# Patient Record
Sex: Female | Born: 1950 | Race: White | Hispanic: No | Smoking: Never smoker
Health system: Southern US, Community
[De-identification: ages and names within clinical notes are randomized; demographics above are authoritative.]

## PROBLEM LIST (undated history)

## (undated) ENCOUNTER — Emergency Department (HOSPITAL_BASED_OUTPATIENT_CLINIC_OR_DEPARTMENT_OTHER): Payer: Medicare Other

## (undated) DIAGNOSIS — R0602 Shortness of breath: Secondary | ICD-10-CM

## (undated) DIAGNOSIS — D689 Coagulation defect, unspecified: Secondary | ICD-10-CM

## (undated) DIAGNOSIS — R1310 Dysphagia, unspecified: Secondary | ICD-10-CM

## (undated) DIAGNOSIS — K5900 Constipation, unspecified: Secondary | ICD-10-CM

## (undated) DIAGNOSIS — M1990 Unspecified osteoarthritis, unspecified site: Secondary | ICD-10-CM

## (undated) DIAGNOSIS — E7800 Pure hypercholesterolemia, unspecified: Secondary | ICD-10-CM

## (undated) DIAGNOSIS — E039 Hypothyroidism, unspecified: Secondary | ICD-10-CM

## (undated) DIAGNOSIS — M629 Disorder of muscle, unspecified: Secondary | ICD-10-CM

## (undated) DIAGNOSIS — Z86718 Personal history of other venous thrombosis and embolism: Secondary | ICD-10-CM

## (undated) DIAGNOSIS — E792 Myoadenylate deaminase deficiency: Secondary | ICD-10-CM

## (undated) DIAGNOSIS — Z9884 Bariatric surgery status: Secondary | ICD-10-CM

## (undated) DIAGNOSIS — H18459 Nodular corneal degeneration, unspecified eye: Secondary | ICD-10-CM

## (undated) DIAGNOSIS — N200 Calculus of kidney: Secondary | ICD-10-CM

## (undated) DIAGNOSIS — M549 Dorsalgia, unspecified: Secondary | ICD-10-CM

## (undated) DIAGNOSIS — K829 Disease of gallbladder, unspecified: Secondary | ICD-10-CM

## (undated) DIAGNOSIS — M797 Fibromyalgia: Secondary | ICD-10-CM

## (undated) DIAGNOSIS — M2550 Pain in unspecified joint: Secondary | ICD-10-CM

## (undated) DIAGNOSIS — D649 Anemia, unspecified: Secondary | ICD-10-CM

## (undated) DIAGNOSIS — E559 Vitamin D deficiency, unspecified: Secondary | ICD-10-CM

## (undated) DIAGNOSIS — R600 Localized edema: Secondary | ICD-10-CM

## (undated) DIAGNOSIS — F419 Anxiety disorder, unspecified: Secondary | ICD-10-CM

## (undated) DIAGNOSIS — I10 Essential (primary) hypertension: Secondary | ICD-10-CM

## (undated) DIAGNOSIS — N979 Female infertility, unspecified: Secondary | ICD-10-CM

## (undated) DIAGNOSIS — E538 Deficiency of other specified B group vitamins: Secondary | ICD-10-CM

## (undated) DIAGNOSIS — G9332 Myalgic encephalomyelitis/chronic fatigue syndrome: Secondary | ICD-10-CM

## (undated) DIAGNOSIS — K760 Fatty (change of) liver, not elsewhere classified: Secondary | ICD-10-CM

## (undated) DIAGNOSIS — T7840XA Allergy, unspecified, initial encounter: Secondary | ICD-10-CM

## (undated) DIAGNOSIS — R7303 Prediabetes: Secondary | ICD-10-CM

## (undated) DIAGNOSIS — F32A Depression, unspecified: Secondary | ICD-10-CM

## (undated) HISTORY — DX: Unspecified osteoarthritis, unspecified site: M19.90

## (undated) HISTORY — DX: Allergy, unspecified, initial encounter: T78.40XA

## (undated) HISTORY — PX: CERVICAL FUSION: SHX112

## (undated) HISTORY — DX: Fibromyalgia: M79.7

## (undated) HISTORY — DX: Dorsalgia, unspecified: M54.9

## (undated) HISTORY — PX: GASTRIC BYPASS: SHX52

## (undated) HISTORY — DX: Bariatric surgery status: Z98.84

## (undated) HISTORY — DX: Disease of gallbladder, unspecified: K82.9

## (undated) HISTORY — DX: Coagulation defect, unspecified: D68.9

## (undated) HISTORY — PX: APPENDECTOMY: SHX54

## (undated) HISTORY — DX: Hypothyroidism, unspecified: E03.9

## (undated) HISTORY — DX: Anemia, unspecified: D64.9

## (undated) HISTORY — DX: Anxiety disorder, unspecified: F41.9

## (undated) HISTORY — DX: Myoadenylate deaminase deficiency: E79.2

## (undated) HISTORY — PX: FOOT SURGERY: SHX648

## (undated) HISTORY — DX: Disorder of muscle, unspecified: M62.9

## (undated) HISTORY — DX: Pain in unspecified joint: M25.50

## (undated) HISTORY — DX: Shortness of breath: R06.02

## (undated) HISTORY — DX: Prediabetes: R73.03

## (undated) HISTORY — DX: Essential (primary) hypertension: I10

## (undated) HISTORY — DX: Fatty (change of) liver, not elsewhere classified: K76.0

## (undated) HISTORY — DX: Vitamin D deficiency, unspecified: E55.9

## (undated) HISTORY — DX: Deficiency of other specified B group vitamins: E53.8

## (undated) HISTORY — DX: Myalgic encephalomyelitis/chronic fatigue syndrome: G93.32

## (undated) HISTORY — DX: Nodular corneal degeneration, unspecified eye: H18.459

## (undated) HISTORY — DX: Localized edema: R60.0

## (undated) HISTORY — PX: TOTAL HIP ARTHROPLASTY: SHX124

## (undated) HISTORY — DX: Dysphagia, unspecified: R13.10

## (undated) HISTORY — DX: Female infertility, unspecified: N97.9

## (undated) HISTORY — PX: CHOLECYSTECTOMY: SHX55

## (undated) HISTORY — DX: Depression, unspecified: F32.A

## (undated) HISTORY — DX: Calculus of kidney: N20.0

## (undated) HISTORY — DX: Personal history of other venous thrombosis and embolism: Z86.718

## (undated) HISTORY — DX: Pure hypercholesterolemia, unspecified: E78.00

## (undated) HISTORY — DX: Constipation, unspecified: K59.00

## (undated) HISTORY — PX: ABDOMINAL HYSTERECTOMY: SHX81

## (undated) HISTORY — PX: JOINT REPLACEMENT: SHX530

---

## 2009-04-07 DIAGNOSIS — J302 Other seasonal allergic rhinitis: Secondary | ICD-10-CM | POA: Insufficient documentation

## 2009-04-07 DIAGNOSIS — F418 Other specified anxiety disorders: Secondary | ICD-10-CM

## 2009-04-07 DIAGNOSIS — G729 Myopathy, unspecified: Secondary | ICD-10-CM

## 2009-04-07 DIAGNOSIS — IMO0002 Reserved for concepts with insufficient information to code with codable children: Secondary | ICD-10-CM | POA: Insufficient documentation

## 2009-04-07 DIAGNOSIS — G43909 Migraine, unspecified, not intractable, without status migrainosus: Secondary | ICD-10-CM | POA: Insufficient documentation

## 2009-04-07 DIAGNOSIS — G4700 Insomnia, unspecified: Secondary | ICD-10-CM | POA: Insufficient documentation

## 2009-04-07 DIAGNOSIS — D519 Vitamin B12 deficiency anemia, unspecified: Secondary | ICD-10-CM

## 2009-04-07 DIAGNOSIS — M419 Scoliosis, unspecified: Secondary | ICD-10-CM

## 2009-04-07 HISTORY — DX: Insomnia, unspecified: G47.00

## 2009-04-07 HISTORY — DX: Other specified anxiety disorders: F41.8

## 2009-04-07 HISTORY — DX: Myopathy, unspecified: G72.9

## 2009-04-07 HISTORY — DX: Scoliosis, unspecified: M41.9

## 2009-04-07 HISTORY — DX: Vitamin B12 deficiency anemia, unspecified: D51.9

## 2009-04-07 HISTORY — DX: Migraine, unspecified, not intractable, without status migrainosus: G43.909

## 2009-04-07 HISTORY — DX: Other seasonal allergic rhinitis: J30.2

## 2009-09-28 DIAGNOSIS — M130 Polyarthritis, unspecified: Secondary | ICD-10-CM | POA: Insufficient documentation

## 2009-09-28 HISTORY — DX: Polyarthritis, unspecified: M13.0

## 2010-08-19 DIAGNOSIS — R7301 Impaired fasting glucose: Secondary | ICD-10-CM

## 2010-08-19 DIAGNOSIS — E785 Hyperlipidemia, unspecified: Secondary | ICD-10-CM

## 2010-08-19 HISTORY — DX: Hyperlipidemia, unspecified: E78.5

## 2010-08-19 HISTORY — DX: Impaired fasting glucose: R73.01

## 2010-10-13 DIAGNOSIS — L409 Psoriasis, unspecified: Secondary | ICD-10-CM

## 2010-10-13 HISTORY — DX: Psoriasis, unspecified: L40.9

## 2010-11-15 DIAGNOSIS — M810 Age-related osteoporosis without current pathological fracture: Secondary | ICD-10-CM

## 2010-11-15 HISTORY — DX: Age-related osteoporosis without current pathological fracture: M81.0

## 2012-08-12 DIAGNOSIS — Z9884 Bariatric surgery status: Secondary | ICD-10-CM | POA: Insufficient documentation

## 2012-08-12 DIAGNOSIS — D729 Disorder of white blood cells, unspecified: Secondary | ICD-10-CM | POA: Insufficient documentation

## 2012-08-12 DIAGNOSIS — D75839 Thrombocytosis, unspecified: Secondary | ICD-10-CM | POA: Insufficient documentation

## 2012-08-12 DIAGNOSIS — D72828 Other elevated white blood cell count: Secondary | ICD-10-CM

## 2012-08-12 HISTORY — DX: Other elevated white blood cell count: D72.828

## 2012-08-12 HISTORY — DX: Bariatric surgery status: Z98.84

## 2012-08-12 HISTORY — DX: Thrombocytosis, unspecified: D75.839

## 2013-10-30 DIAGNOSIS — R32 Unspecified urinary incontinence: Secondary | ICD-10-CM

## 2013-10-30 HISTORY — DX: Unspecified urinary incontinence: R32

## 2015-03-03 DIAGNOSIS — M545 Low back pain, unspecified: Secondary | ICD-10-CM

## 2015-03-03 HISTORY — DX: Low back pain, unspecified: M54.50

## 2015-12-02 DIAGNOSIS — J3081 Allergic rhinitis due to animal (cat) (dog) hair and dander: Secondary | ICD-10-CM

## 2015-12-02 HISTORY — DX: Allergic rhinitis due to animal (cat) (dog) hair and dander: J30.81

## 2016-07-14 LAB — HM COLONOSCOPY

## 2016-08-29 LAB — CBC WITH AUTOMATED DIFF
ABS. BASOPHILS: 0 10*3/uL (ref 0.00–0.10)
ABS. EOSINOPHILS: 0.2 10*3/uL (ref 0.00–0.50)
ABS. IMM. GRANS.: 0.02 10*3/uL (ref 0–0.03)
ABS. LYMPHOCYTES: 2.2 10*3/uL (ref 1.20–3.70)
ABS. MONOCYTES: 0.6 10*3/uL (ref 0.20–0.80)
ABS. NEUTROPHILS: 5.2 10*3/uL (ref 1.56–6.13)
BASOPHILS: 0.2 %
EOSINOPHILS: 2.6 %
HCT: 37.7 % (ref 36.0–45.0)
HGB: 12.5 GM/DL (ref 11.8–14.8)
IMMATURE GRANULOCYTES: 0.2 % (ref 0–0.43)
LYMPHOCYTES: 26.4 %
MCH: 29.1 PG (ref 25.6–32.2)
MCHC: 33.2 G/DL (ref 32.2–35.5)
MCV: 87.9 FL (ref 80–95)
MONOCYTES: 7.8 %
MPV: 9.5 FL (ref 9.4–12.4)
NEUTROPHILS: 62.8 %
NRBC: 0 /100 WBC (ref 0–0.02)
PLATELET: 281 10*3/uL (ref 150–400)
RBC: 4.29 M/ul (ref 3.93–5.22)
RDW: 12.6 % (ref 11.6–14.4)
WBC: 8.2 10*3/uL (ref 4.0–10.0)

## 2016-08-29 LAB — METABOLIC PANEL, COMPREHENSIVE
ALT (SGPT): 19 IU/L (ref 13–56)
AST (SGOT): 15 IU/L (ref 15–37)
Albumin: 3.4 G/DL (ref 3.4–5.0)
Alk. phosphatase: 116 U/L (ref 45–117)
Anion gap: 6.7 MMOL/L (ref 5–15)
BUN: 9 MG/DL (ref 7–18)
Bilirubin, total: 0.4 MG/DL (ref 0.2–1.2)
CO2: 27.3 MMOL/L (ref 21–32)
Calcium: 8.8 MG/DL (ref 8.5–10.1)
Chloride: 103 MMOL/L (ref 98–110)
Creatinine: 0.7 mg/dL (ref 0.6–1.0)
Estimated GFR: >60 mL/min/{1.73_m2} (ref >60)
Glucose: 98 MG/DL (ref 74–140)
Potassium: 3.7 MMOL/L (ref 3.5–5.1)
Protein, total: 7.4 G/DL (ref 6.4–8.2)
Sodium: 137 MMOL/L (ref 136–145)

## 2016-08-29 LAB — LIPASE: Lipase: 223.3 U/L (ref 73–393)

## 2016-09-12 ENCOUNTER — Ambulatory Visit (HOSPITAL_BASED_OUTPATIENT_CLINIC_OR_DEPARTMENT_OTHER)

## 2016-09-12 ENCOUNTER — Ambulatory Visit: Admitting: Ophthalmology

## 2016-10-10 DIAGNOSIS — L408 Other psoriasis: Secondary | ICD-10-CM | POA: Insufficient documentation

## 2016-10-10 HISTORY — DX: Other psoriasis: L40.8

## 2016-11-17 DIAGNOSIS — Z0289 Encounter for other administrative examinations: Secondary | ICD-10-CM

## 2016-11-17 HISTORY — DX: Encounter for other administrative examinations: Z02.89

## 2017-02-05 ENCOUNTER — Ambulatory Visit: Admitting: Neurological Surgery

## 2017-02-05 NOTE — Addendum Note (Signed)
REPORT:          SITE READ:5    The patient is status post anterior fusion at C6/C7 with complete solid bony  fusion across the disc space at this level.    Hendricks Limes MD 04/30/2017 3:48 PM

## 2017-02-16 ENCOUNTER — Ambulatory Visit: Admitting: Neurological Surgery

## 2017-02-16 LAB — HX BASIC METABOLIC PANEL
CASE NUMBER: 2018082001725
HX ANION GAP: 6 (ref 3.0–11.0)
HX BUN: 13 mg/dL (ref 7.0–18.0)
HX CALCIUM LVL: 9.3 mg/dL (ref 8.5–10.1)
HX CHLORIDE: 101 mmol/L (ref 98.0–110.0)
HX CO2: 30 mmol/L (ref 21.0–32.0)
HX CREATININE: 0.895 mg/dL (ref 0.55–1.3)
HX GLUCOSE LVL: 94 mg/dL (ref 65.0–110.0)
HX POTASSIUM LVL: 3.9 mmol/L (ref 3.6–5.2)
HX SODIUM LVL: 137 mmol/L (ref 136.0–145.0)

## 2017-02-16 LAB — HX CBC W/ INDICES
CASE NUMBER: 2018082001725
HX HCT: 40.6 % (ref 36.0–47.0)
HX HGB: 13.1 g/dL (ref 11.8–15.8)
HX MCH: 29 pg (ref 27.0–31.0)
HX MCHC: 32.3 g/dL (ref 32.0–36.0)
HX MCV: 91 fL (ref 81.0–99.0)
HX MPV: 9.9 fL (ref 7.4–11.5)
HX NRBC PERCENT: 0 %
HX PLATELET: 311 10*3/uL (ref 150.0–400.0)
HX RBC: 4.47 10*6/uL (ref 3.6–5.0)
HX RDW: 12.9 % (ref 11.5–14.5)
HX WBC: 7.4 10*3/uL (ref 3.7–11.2)

## 2017-02-16 LAB — HX PSC EKG LAB: CASE NUMBER: 2018082001725

## 2017-02-16 LAB — HX GLOMERULAR FILTRATION RATE (ESTIMATED)
CASE NUMBER: 2018082001725
HX AFN AMER GLOMERULAR FILTRATION RATE: 78 mL/min/{1.73_m2}
HX NON-AFN AMER GLOMERULAR FILTRATION RATE: 67 mL/min/{1.73_m2}

## 2017-02-17 LAB — HX URINE DIPSTICK W/REFLEX
CASE NUMBER: 2018082001727
HX UA BILIRUBIN: NEGATIVE
HX UA BLOOD: NEGATIVE
HX UA GLUCOSE: NEGATIVE
HX UA KETONES: NEGATIVE
HX UA NITRITE: NEGATIVE
HX UA PH: 5 (ref 5.0–8.0)
HX UA PROTEIN: NEGATIVE
HX UA RBC: 4 /HPF — ABNORMAL HIGH (ref 0.0–3.0)
HX UA SPECIFIC GRAVITY: 1.019 (ref 1.005–1.03)
HX UA SQUAMOUS EPITHELIAL: <1 (ref 0.0–5.0)
HX UA UROBILINOGEN: NEGATIVE
HX UA WBC: 13 /HPF — ABNORMAL HIGH (ref 0.0–5.0)

## 2017-02-18 LAB — HX URINE CULTURE
CASE NUMBER: 2018082001728
HX F: 30000
HX P: 30000

## 2017-02-18 LAB — HX MRSA/MSSA PRE-OP SCREEN: CASE NUMBER: 2018082001726

## 2017-02-26 ENCOUNTER — Ambulatory Visit: Admitting: Neurological Surgery

## 2017-02-27 ENCOUNTER — Inpatient Hospital Stay
Admit: 2017-02-27 | Disposition: A | Discharge status: Rehabilitation Facility | Source: Ambulatory Visit | Attending: Neurological Surgery | Admitting: Neurological Surgery

## 2017-02-27 NOTE — Op Note (Signed)
__________________________________________________________________________    OPERATIVE REPORT  DATE:  02/27/2017    PREOPERATIVE DIAGNOSIS:  L2 to L5 decompressive surgery for lumbar canal  stenosis causing neurogenic claudication.    POSTOPERATIVE DIAGNOSIS:  L2 to L5 decompressive surgery for lumbar canal  stenosis causing neurogenic claudication.    PROCEDURES:    1.  L2-3, L3-4 and L4-5 decompressive lumbar laminectomy.  2.  L2-3, L3-4 and L4-5 laminoforaminotomy.    SURGEON:  Radene Gunning, M.D.    ASSISTANT:  Tina Griffiths, P.A. -C.    ANESTHESIA:  General.     INDICATIONS:  This 66 year old lady has been having significant back radiating  down to both lower limbs.  The patient's MRI scan demonstrated severe canal  stenosis at L2-3, L3-4 and L4-5.  The patient is here for decompressive surgery.    The patient was explained the procedure in detail.  The patient also explained  the possible complications to include but not limited to bleeding, infection,  nerve injury, CSF leak, unstable spine, bowel and bladder sphincter  disturbances, paraplegia as the patient has already been explained these and  written consent has been obtained in the office.    DESCRIPTION OF PROCEDURE:  The patient was transferred to the OR.  General  anesthesia was induced.  The patient was intubated with ease.  The patient was  flipped on the OR safety table.  Lower back was cleansed with chlorhexidine.   Marker x-ray was obtained for approximate level of surgery.  ChloraPrep was used  to obtain a sterile field with sterile drapes.  Time out was called to identify  the correct patient and the procedure.  Midline skin was incised.  Subcutaneous  tissue was dissected.  Retractors were placed.  Spinous processes were  identified.  Thoracolumbar fascia was incised on either side of the spinous  process to expose the interlaminar space between L2 to L5.  Marker x-ray was  again opened to the level of surgery.  Retractors were then  placed.  Spinous  processes at L3 as well as L4 were completely removed.  The lower half of the L2  was also removed.  This led to the lamina.  High speed drill was then used to  clear the gutters on either side on each of the lamina.  Upon thinning it out,  up cut punches were then used to remove the lamina.  This led to the exposure of  the ligamentum flavum.  This was carefully curetted out to enter the canal.   Cottonoids were placed in the canal to prevent any extra durotomy.  Up cut  punches were then used to remove all the lamina to complete the laminectomy.  It  was noted that the patient has significantly hypertrophied ligamentum flavum.   It was carefully curetted out to decompress between L2 to L5.  At each level,  the pedicles were palpated to make sure the lateral gutter was cleared by using  up cut punch.  The patient has mild foraminal stenosis at all the levels.   Curved Kerrison punches were then used sequentially at L2-3, L4-5 and L4-5 and  were decompressed.  Epidural hemostasis using bipolar cautery and Surgiflo.   Revita human intact membrane was placed in the lateral gutters to prevent any  further scarring.  Exparel was infiltrated in the paraspinal muscles.  A dry  Gelfoam was placed over the dura.  A medium Hemovac drain was placed in the  intermuscular plane.  Thoracolumbar fascia was then closed  using 0 Vicryl.   Another medium Hemovac was placed in the subcutaneous layer and brought through  a separate stab incision.  Subcutaneous tissue was then closed using 0 Vicryl as  well as 3-0 Vicryl.  Skin was closed using a ZipLine.  Drain was secured to the  skin using 3-0 nylon.  All the sharps and softs were correct x2.  Sterile  dressing was applied.  The patient was flipped back onto the bed.  Sedation was  weaned off and extubated with ease and transferred to PACU.    ESTIMATED BLOOD LOSS:  250 mL.    COMPLICATIONS:  None.    Dictated by:  Radene Gunning, M.D.    D:  02/28/2017 15:25:16  T:   03/01/2017 07:11:16  E:  03/01/2017 07:11:16  KK/par  Job# [id number]   SIGNATURE LINE    Electronically signed by Janice Norrie MD, Verna Czech on 03/06/2017 at 09:49:48 EST

## 2017-02-28 LAB — HX CBC W/ INDICES
CASE NUMBER: 2018094000666
HX HCT: 30.7 % — ABNORMAL LOW (ref 36.0–47.0)
HX HGB: 10.3 g/dL — ABNORMAL LOW (ref 11.8–15.8)
HX MCH: 29 pg (ref 27.0–31.0)
HX MCHC: 33.6 g/dL (ref 32.0–36.0)
HX MCV: 87 fL (ref 81.0–99.0)
HX MPV: 9.4 fL (ref 7.4–11.5)
HX NRBC PERCENT: 0 %
HX PLATELET: 240 10*3/uL (ref 150.0–400.0)
HX RBC: 3.52 10*6/uL — ABNORMAL LOW (ref 3.6–5.0)
HX RDW: 12.5 % (ref 11.5–14.5)
HX WBC: 8.5 10*3/uL (ref 3.7–11.2)

## 2017-02-28 LAB — HX MRSA/MSSA PRE-OP SCREEN: CASE NUMBER: 2018092002261

## 2017-02-28 LAB — HX ELECTROLYTE PANEL
CASE NUMBER: 2018094000115
HX ANION GAP: 10 (ref 3.0–11.0)
HX CHLORIDE: 101 mmol/L (ref 98.0–110.0)
HX CO2: 23 mmol/L (ref 21.0–32.0)
HX POTASSIUM LVL: 3.8 mmol/L (ref 3.6–5.2)
HX SODIUM LVL: 134 mmol/L — ABNORMAL LOW (ref 136.0–145.0)

## 2017-03-01 LAB — HX CBC W/ INDICES
CASE NUMBER: 2018095000127
HX HCT: 27.7 % — ABNORMAL LOW (ref 36.0–47.0)
HX HGB: 8.9 g/dL — ABNORMAL LOW (ref 11.8–15.8)
HX MCH: 29 pg (ref 27.0–31.0)
HX MCHC: 32.1 g/dL (ref 32.0–36.0)
HX MCV: 90 fL (ref 81.0–99.0)
HX MPV: 9.5 fL (ref 7.4–11.5)
HX NRBC PERCENT: 0 %
HX PLATELET: 196 10*3/uL (ref 150.0–400.0)
HX RBC: 3.07 10*6/uL — ABNORMAL LOW (ref 3.6–5.0)
HX RDW: 12.8 % (ref 11.5–14.5)
HX WBC: 7.2 10*3/uL (ref 3.7–11.2)

## 2017-03-01 LAB — HX ELECTROLYTE PANEL
CASE NUMBER: 2018095000127
HX ANION GAP: 7 (ref 3.0–11.0)
HX CHLORIDE: 113 mmol/L — ABNORMAL HIGH (ref 98.0–110.0)
HX CO2: 25 mmol/L (ref 21.0–32.0)
HX POTASSIUM LVL: 2.7 mmol/L — AB (ref 3.6–5.2)
HX SODIUM LVL: 145 mmol/L (ref 136.0–145.0)

## 2017-03-02 LAB — HX ELECTROLYTE PANEL
CASE NUMBER: 2018096000706
HX ANION GAP: 8 (ref 3.0–11.0)
HX CHLORIDE: 100 mmol/L (ref 98.0–110.0)
HX CO2: 28 mmol/L (ref 21.0–32.0)
HX POTASSIUM LVL: 3.3 mmol/L — ABNORMAL LOW (ref 3.6–5.2)
HX SODIUM LVL: 136 mmol/L (ref 136.0–145.0)

## 2017-03-02 LAB — HX POTASSIUM LEVEL
CASE NUMBER: 2018095002530
HX POTASSIUM LVL: 3.4 mmol/L — ABNORMAL LOW (ref 3.6–5.2)

## 2017-04-18 DIAGNOSIS — L821 Other seborrheic keratosis: Secondary | ICD-10-CM | POA: Insufficient documentation

## 2017-04-18 HISTORY — DX: Other seborrheic keratosis: L82.1

## 2017-07-19 LAB — LIPID PROFILE (EXT)
Chol/HDL Ratio (EXT): 3 ratio
Cholesterol (EXT): 194 mg/dL
HDL Cholesterol (EXT): 64 mg/dL
LDL Cholesterol, CALC (EXT): 111 mg/dL
Triglycerides (EXT): 95 mg/dL

## 2017-08-20 ENCOUNTER — Ambulatory Visit: Admitting: Neurological Surgery

## 2017-08-30 ENCOUNTER — Emergency Department
Admit: 2017-08-30 | Disposition: A | Discharge status: Home / Self Care | Source: Home / Self Care | Attending: Physician Assistant | Admitting: Physician Assistant

## 2017-08-31 LAB — HX URINE DIPSTICK W/REFLEX
CASE NUMBER: 2018278000206
HX UA BLOOD: NEGATIVE
HX UA GLUCOSE: NEGATIVE
HX UA HYALINE CASTS: 4 /LPF (ref 0.0–4.0)
HX UA KETONES: NEGATIVE
HX UA LEUKOCYTE ESTERASE: 75 WBC/uL — AB
HX UA NITRITE: NEGATIVE
HX UA PH: 5 (ref 5.0–8.0)
HX UA PROTEIN: NEGATIVE
HX UA RBC: 4 /HPF — ABNORMAL HIGH (ref 0.0–2.0)
HX UA SPECIFIC GRAVITY: 1.021 (ref 1.003–1.03)
HX UA SQUAMOUS EPITHELIAL: 1 /HPF (ref 0.0–5.0)
HX UA UROBILINOGEN: 2 — AB
HX UA WBC: 5 /HPF (ref 0.0–5.0)

## 2017-09-01 LAB — HX URINE CULTURE
CASE NUMBER: 2018278000238
HX F: 20000

## 2017-11-01 LAB — LIPID PROFILE (EXT)
Chol/HDL Ratio (EXT): 3 ratio
Cholesterol (EXT): 201 mg/dL
HDL Cholesterol (EXT): 67 mg/dL
LDL Cholesterol, CALC (EXT): 107 mg/dL
Triglycerides (EXT): 137 mg/dL

## 2018-01-01 DIAGNOSIS — Z9889 Other specified postprocedural states: Secondary | ICD-10-CM | POA: Insufficient documentation

## 2018-01-01 DIAGNOSIS — E669 Obesity, unspecified: Secondary | ICD-10-CM | POA: Insufficient documentation

## 2018-01-01 HISTORY — DX: Obesity, unspecified: E66.9

## 2018-01-01 HISTORY — DX: Other specified postprocedural states: Z98.890

## 2018-01-14 DIAGNOSIS — Z96642 Presence of left artificial hip joint: Secondary | ICD-10-CM

## 2018-01-14 HISTORY — DX: Presence of left artificial hip joint: Z96.642

## 2018-01-17 DIAGNOSIS — E871 Hypo-osmolality and hyponatremia: Secondary | ICD-10-CM | POA: Insufficient documentation

## 2018-01-17 HISTORY — DX: Hypo-osmolality and hyponatremia: E87.1

## 2018-04-30 DIAGNOSIS — L304 Erythema intertrigo: Secondary | ICD-10-CM

## 2018-04-30 DIAGNOSIS — L814 Other melanin hyperpigmentation: Secondary | ICD-10-CM

## 2018-04-30 HISTORY — DX: Erythema intertrigo: L30.4

## 2018-04-30 HISTORY — DX: Other melanin hyperpigmentation: L81.4

## 2018-06-04 LAB — UNMAPPED LAB RESULTS: Albumin (EXT): 3.1 g/dL — ABNORMAL LOW (ref 3.2–5.2)

## 2018-06-10 ENCOUNTER — Ambulatory Visit: Admit: 2018-06-10 | Discharge: 2018-06-10 | Payer: MEDICARE | Attending: Geriatric Medicine | Primary: Internal Medicine

## 2018-06-10 ENCOUNTER — Ambulatory Visit: Attending: Geriatric Medicine | Primary: Internal Medicine

## 2018-06-10 DIAGNOSIS — Z96641 Presence of right artificial hip joint: Secondary | ICD-10-CM

## 2018-06-10 NOTE — Progress Notes (Signed)
[facility]    [address] [place], [place]  [phone]    SUBACUTE REHABILITATION facility/[place]  History and Physical    Name: Aaron MoseCathy Fawver CSN: [id number]   Age: 67 y.o. MRN: [id number]   Sex: female Admit Date: (Not on file)     HPI:     Aaron MoseCathy Krahenbuhl is a 67 y.o. female with multiple medical problems including hypertension, chronic back pain, fibromyalgia, as well as hyperlipidemia, history of gastric bypass surgery due to obesity, who admitted to rehabilitation status post s/p right THA   [facility] from July 11th to July 14th.  Patient reports the pain was severe and  increased with activity , rated  pain  7 /10.   History of chronic pain and patient was  taking Dilaudid 1-2 tabs every 3 hr as needed with good relief.  Patient ambulated with rolling walker with assistance.  Denies any chest pain or palpitation.  Tolerated p.o. well and patient's last bowel movement is fall time 4 days ago.  Denies any UTI symptoms.  She had Positive generalized weakness, poor balance and coordination  and gait instability.  Patient also reports some pain in the lower back area, as she  expressed interest in TENS as one of  treatment options for her back pain.   No fever or chill.  Denies any headache or dizziness.  Denies any chest pain or palpitation.  No nausea /vomiting.  No UTI symptoms.  Patient tolerated p.o. well.          Denies any significant mood swing.  Patient's memory was stable        Past medical history including arthritis, but B12 deficiency, depression and anxiety, history of fibromyalgia, history of hypertension, insomnia, iron deficiency anemia, migraine, scoliosis and kyphosis back pain, multiple as back surgery, history of osteopenia, and history of Port-A-Cath in place with history of thrombocytosis, history [facility], history of a chronic low back pain, history of fevers psoriasis, and obesity with BMI between 30 and 39 and  history of a cervical spinal surgery 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    Social History     Socioeconomic History   ??? Marital status: MARRIED     Spouse name: Not on file   ??? Number of children: Not on file   ??? Years of education: Not on file   ??? Highest education level: Not on file     Functional History,       Medications on transfer  Prior to Admission medications    Not on File     Aspirin aspirin 81 mg twice daily status post right THA a DVT prophylaxis for 30 days  Vitamin-D 5000 unit once weekly  Iron sulfate 325 mg once daily  Gabapentin 300 mg t.i.d.  Hydromorphone 2 mg 1 tab every 3 hr as needed for pain hydromorphone 2 mg 2 tabs every 3 hr as needed for pain between 8 and 10  Levothyroxine 50 mg mcg once daily  Lisinopril/hydrochlorothiazide 10/12.5 mg 1 tab once daily naproxen tablet 500 mg give 1 tab by mouth twice a day for pain omeprazole extended release 20 mg once daily MiraLax 17 g in 8 oz of water p.o. once daily hold for loose stool  Senna Plus 8.6 mg/50 mg 1  tab once a day hold for loose stool  Temazepam 30 mg 1 capsule q.h.s. for insomnia  Tylenol Extra Strength 500 mg 2 tabs by mouth every 8 hr as needed for pain   Desvenlafaxine  ER 24 hr 100 mg once daily for depression      Allergy; prednisone, Compazine, , Lyrica phenothiazine        Review of Systems    A comprehensive review of systems was negative except for that written in the History of Present Illness.      Physical Exam:          Physical Exam:  VS 98/50-98-82-20, 96% wt 214#       GEN: Alert and oriented times three. NAD.  Patient able to follow commands.  Answers questions appropriately. Speech clear.   EYES:  Pupils equal and reactive to light, extraocular movements intact, conjunctiva normal, lids with out lesions.    HEENT: MMM, No thyromegaly, no lymphadenopathy. Oral mucosa without coating on tongue.  HEART: RRR +S1 +S2, no JVD, pulses 2+ distally.   LUNGS: CTA B/L no wheezes, rales or rhonchi.   ABDOMEN: + BS, soft NT/ND no organomegaly.   GU:  No suprapubic tenderness,  Back mild tenderness to palpation, scar tissue noticed in the back  EXTREMITIES:   Right hip incision dressing clean and dry, tenderness to palpation, no bleeding.  Positive lower extremity edema and positive weakness and gait instability  SKIN: no rashes or skin breakdown.   PSYCH:  Anxious affect and memory was appropriate.   NEURO:   Positive weakness and gait instability, ambulated with rolling walker      Labs Reviewed:    All lab results for the last 24 hours reviewed.    Assessment/Plan     67 years old woman with multiple medical problems including chronic back pain, severe osteoarthritis, status post right total hip replacement , with deconditioning and weakness/gait instability who was admitted to subacute rehab for IDT team rehab/ evaluation and management including PT OT and ST, as well as nursing assistance support and active management of current medical condition, specifically     Status post right  total hip replacement, pain management, DVT prophylaxis with aspirin b.i.d. add a PT OT rehabilitation to  improve strengths, induration and flexibility, follow-up with orthopedic surgeon [facility]     Gait instability, fall precaution home safety issue discussion, using rolling walker and assistance, patient's pain was complicated with chronic lower back pain     Chronic back pain, history of  back area surgery, PT OT, pain management, prescription for TENS  unit    Constipation ,  increase p.o. hydration, increase dietary fiber fiber, increase activity, will increase senna S to 2 tab b.i.d., Dulcolax suppository 10 mg p.r. x1 tonight    Hypertension, continue lisinopril/hydrochlorothiazide, check BP and heart rate, BMP     Hyperlipidemia low-fat cholesterol diet diet,    Depression , behavior support and counseling, continue medication    Fibromyalgia, pain management, multiple disc brain intervention discussion and management D advised, non pharmaceutical intervention of the recommend    Code status CPR, DPOA available    Consultation discussion, all questions and concerns addressed, patient expressed understanding and agreed with recommendation treatment  Total current time over 70 min    Ortencia Kick, MD  June 10, 2018  11:43 AM

## 2018-06-10 NOTE — Progress Notes (Signed)
[facility]    [address], [place], [place]  [address]    SUBACUTE REHABILITATION facility/[place]  History and Physical    Name: April Braun CSN: [id number]   Age: 67 y.o. MRN: [id number]   Sex: female Admit Date: (Not on file)     HPI:     April Braun is a 67 y.o. female with multiple medical problems including hypertension, chronic back pain, fibromyalgia, as well as hyperlipidemia, history of gastric bypass surgery due to obesity, who admitted to rehabilitation status post s/p right THA   at [facility]/ from July 11th to July 14th.  Patient reports the pain was severe and  increased with activity , rated  pain  7 /10.   History of chronic pain and patient was  taking Dilaudid 1-2 tabs every 3 hr as needed with good relief.  Patient ambulated with rolling walker with assistance.  Denies any chest pain or palpitation.  Tolerated p.o. well and patient's last bowel movement is fall time 4 days ago.  Denies any UTI symptoms.  She had Positive generalized weakness, poor balance and coordination  and gait instability.  Patient also reports some pain in the lower back area, as she  expressed interest in TENS as one of  treatment options for her back pain.   No fever or chill.  Denies any headache or dizziness.  Denies any chest pain or palpitation.  No nausea /vomiting.  No UTI symptoms.  Patient tolerated p.o. well.          Denies any significant mood swing.  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    Social History     Socioeconomic History   ??? Marital status: MARRIED     Spouse name: Not on file   ??? Number of children: Not on file   ??? Years of education: Not on file   ??? Highest education level: Not on file     Functional History,       Medications on transfer  Prior to Admission medications    Not on File     Aspirin aspirin 81 mg twice daily status post right THA a DVT prophylaxis for 30 days  Vitamin-D 5000 unit once weekly  Iron sulfate 325 mg once daily  Gabapentin 300 mg t.i.d.  Hydromorphone 2 mg 1 tab every 3 hr as needed for pain hydromorphone 2 mg 2 tabs every 3 hr as needed for pain between 8 and 10  Levothyroxine 50 mg mcg once daily  Lisinopril/hydrochlorothiazide 10/12.5 mg 1 tab once daily naproxen tablet 500 mg give 1 tab by mouth twice a day for pain omeprazole extended release 20 mg once daily MiraLax 17 g in 8 oz of water p.o. once daily hold for loose stool  Senna Plus 8.6 mg/50 mg 1  tab once a day hold for loose stool  Temazepam 30 mg 1 capsule q.h.s. for insomnia  Tylenol Extra Strength 500 mg 2 tabs by mouth every 8 hr as needed for pain   Desvenlafaxine  ER 24 hr 100 mg once daily for depression      Allergy; prednisone, Compazine, , Lyrica phenothiazine        Review of Systems    A comprehensive review of systems was negative except for that written in the History of Present Illness.      Physical Exam:          Physical Exam:  VS 98/50-98-82-20, 96% wt 214#       GEN: Alert and oriented times three. NAD.  Patient able to follow commands.  Answers questions appropriately. Speech clear.   EYES:  Pupils equal and reactive to light, extraocular movements intact, conjunctiva normal, lids with out lesions.   HEENT: MMM, No thyromegaly, no lymphadenopathy. Oral mucosa without coating on  tongue.  HEART: RRR +S1 +S2, no JVD, pulses 2+ distally.   LUNGS: CTA B/L no wheezes, rales or rhonchi.   ABDOMEN: + BS, soft NT/ND no organomegaly.   GU:  No suprapubic tenderness,  Back mild tenderness to palpation, scar tissue noticed in the back  EXTREMITIES:   Right hip incision dressing clean and dry, tenderness to palpation, no bleeding.  Positive lower extremity edema and positive weakness and gait instability  SKIN: no rashes or skin breakdown.   PSYCH:  Anxious affect and memory was appropriate.   NEURO:   Positive weakness and gait instability, ambulated with rolling walker      Labs Reviewed:    All lab results for the last 24 hours reviewed.    Assessment/Plan     67 years old woman with multiple medical problems including chronic back pain, severe osteoarthritis, status post right total hip replacement , with deconditioning and weakness/gait instability who was admitted to subacute rehab for IDT team rehab/ evaluation and management including PT OT and ST, as well as nursing assistance support and active management of current medical condition, specifically     Status post right  total hip replacement, pain management, DVT prophylaxis with aspirin b.i.d. add a PT OT rehabilitation to  improve strengths, induration and flexibility, follow-up with [facility]opedic [facility]     Gait instability, fall precaution home safety issue discussion, using rolling walker and assistance, patient's pain was complicated with chronic lower back pain     Chronic back pain, history of  back area surgery, PT OT, pain management, prescription for TENS  unit    Constipation ,  increase p.o. hydration, increase dietary fiber fiber, increase activity, will increase senna S to 2 tab b.i.d., Dulcolax suppository 10 mg p.r. x1 tonight    Hypertension, continue lisinopril/hydrochlorothiazide, check BP and heart rate, BMP    Hyperlipidemia low-fat cholesterol diet diet,    Depression , behavior support and counseling, continue  medication    Fibromyalgia, pain management, multiple disc brain intervention discussion and management D advised, non pharmaceutical intervention of the recommend    Code status CPR, DPOA available    Consultation discussion, all questions and concerns addressed, patient expressed understanding and agreed with recommendation treatment  Total current time over 70 min    Ortencia Kickhun Rui R Brooklin Rieger, MD  June 10, 2018  11:43 AM

## 2018-06-12 ENCOUNTER — Ambulatory Visit: Admit: 2018-06-12 | Discharge: 2018-06-12 | Payer: MEDICARE | Attending: Geriatric Medicine | Primary: Internal Medicine

## 2018-06-12 ENCOUNTER — Ambulatory Visit: Attending: Geriatric Medicine | Primary: Internal Medicine

## 2018-06-12 DIAGNOSIS — Z96641 Presence of right artificial hip joint: Secondary | ICD-10-CM

## 2018-06-12 NOTE — Progress Notes (Signed)
[address] [place], [place]  [phone]  SNF/[place]  Progress Note    Name: April MoseCathy Mulvihill CSN: [id number]   DOB:02/26/1951    Age: 67 y.o. MRN: [id number]   Sex: female SSN: [id number]         Primary Rehab Diagnosis: Aftercare S/P Right THA    Medical Issues  Status post right THA  Pain management  Gait instability  Anxiety/questionable depression  Hypertension  History of CAD        Subjective:     [facility] as follow-up of status post right THA , gait instability and complained about severe severe pain with ambulation.  Patient also reported she had a very complicated pain management history in the past after she had the left hip surgery.  Patient was somewhat anxious.  Help positive gait instability.  Reports no falling.  Denies any fever or chills.  Tolerated p.o..  Patient had a bowel movement.  Denies any SI or HI.  No hallucination or delusion.  Her memory was stable    Objective:       PHYSICAL EXAM      Vital stable and blood pressure within normal limit     GEN: Alert and oriented times three.   Anxious  EYES:  Extraocular movements intact, conjunctiva normal, lids with out lesions.   HEAD: Normocephalic, Atraumatic  ENT:  Mucosa moist with no lesions noted. No facial droop noted.   NECK:   Supple, no JVD noted.   HEART:   Regular rate and rhythm  LUNGS:   Decreased breath sound no wheezing  ABDOMEN: + BS, soft NT/ND no organomegaly.   EXTREMITIES:   Right lower extremity edema and tenderness to palpation and decreased range of motion  SKIN: no rashes or skin breakdown.   PSYCH:  Anxious affect /good recall  NEURO:  Positive weakness and ambulated with rolling walker  FUNCTION:   Partial dependent ADL and transfer    Labs reviewed    Assessment and plan      Status post right TKA,  pain management which is critical  for this patient,   Continue Dilaudid 2 mg 1-2 tablets every 3 hr as needed for pain, patient is taking naproxen with understanding of the side effect to  the stomach as well as blood pressure/renal impairment.  Continue gabapentin to help patient neuropathic pain.       emphasis  on nonpharm intervention such as ice packing to the area for comfort.  Continue PT OT and support    Gait instability, fall precaution home safety issue discussion, PT OT, rolling walker with assistance,    History of CAD, stable, continue aspirin and management of blood pressure and low-cholesterol diet    History of anxiety/depression, behavior support and counseling, continue temazepam, gabapentin impaired benefit patient's anxiety symptom and patient may need SSRI    Counseling, consultation discussion, chart review, update and discussed with patient and staff, patient expressed understanding and agreed with recommendation treatment    Signed By: Ortencia Kickhun Rui R Francena Zender, MD     June 12, 2018

## 2018-06-12 NOTE — Progress Notes (Signed)
Progress Notes by Ortencia KickZhao, Chun Rui R, MD at 06/12/18 0920                Author: Ortencia KickZhao, Chun Rui R, MD  Service: --  Author Type: Physician       Filed: 06/26/18 1247  Encounter Date: 06/12/2018  Status: Signed          Editor: Ortencia KickZhao, Chun Rui R, MD (Physician)                          [address] [place], [place]  [phone]   SNF/[place]   Progress Note         Name: April MoseCathy Braun  CSN: [id number]        DOB:05/27/1951     Age: 67 y.o.  MRN: [id number]        Sex: female  SSN: [id number]              Primary Rehab Diagnosis: Aftercare S/P Right THA      Medical Issues   Status post right THA   Pain management   Gait instability   Anxiety/questionable depression   Hypertension   History of CAD              Subjective:        [facility] as follow-up of status post right THA , gait instability and complained about severe severe pain with ambulation.  Patient also reported she had a very complicated pain management history in the past  after she had the left hip surgery.  Patient was somewhat anxious.  Help positive gait instability.  Reports no falling.  Denies any fever or chills.  Tolerated p.o..  Patient had a bowel movement.  Denies any SI or HI.  No hallucination or delusion.   Her memory was stable        Objective:           PHYSICAL EXAM         Vital stable and blood pressure within normal limit       GEN: Alert and oriented times three.    Anxious   EYES:  Extraocular movements intact, conjunctiva normal,  lids with out lesions.    HEAD: Normocephalic, Atraumatic   ENT:  Mucosa moist with no lesions noted. No facial droop noted.    NECK:   Supple, no JVD noted.    HEART:   Regular rate and rhythm   LUNGS:   Decreased breath sound no wheezing   ABDOMEN: + BS, soft NT/ND no organomegaly.     EXTREMITIES:   Right lower extremity edema and tenderness to palpation and decreased range of motion   SKIN: no rashes or skin breakdown.    PSYCH:  Anxious affect /good recall   NEURO:  Positive  weakness and ambulated with rolling walker   FUNCTION:   Partial dependent ADL and transfer      Labs reviewed      Assessment and plan         Status post right TKA,  pain management which is critical  for this patient,   Continue Dilaudid 2 mg 1-2 tablets every 3 hr as needed for pain, patient is taking naproxen with understanding of the side effect to the stomach as well as blood pressure/renal  impairment.  Continue gabapentin to help patient neuropathic pain.       emphasis  on nonpharm intervention such as ice packing  to the area for comfort.  Continue PT OT and support      Gait instability, fall precaution home safety issue discussion, PT OT, rolling walker with assistance,      History of CAD, stable, continue aspirin and management of blood pressure and low-cholesterol diet      History of anxiety/depression, behavior support and counseling, continue temazepam, gabapentin impaired benefit patient's anxiety symptom and patient may need SSRI      Counseling, consultation discussion, chart review, update and discussed with patient and staff, patient expressed understanding and agreed with recommendation treatment         Signed By:  Ortencia Kick, MD           June 12, 2018

## 2018-06-14 ENCOUNTER — Ambulatory Visit: Admit: 2018-06-14 | Discharge: 2018-06-14 | Payer: MEDICARE | Attending: Geriatric Medicine | Primary: Internal Medicine

## 2018-06-14 ENCOUNTER — Ambulatory Visit: Attending: Geriatric Medicine | Primary: Internal Medicine

## 2018-06-14 DIAGNOSIS — Z96641 Presence of right artificial hip joint: Secondary | ICD-10-CM

## 2018-06-14 NOTE — Progress Notes (Signed)
[address] [place], [place]  [phone]    SUBACUTE REHABILITATION / [place]  Progress Note    Name: April MoseCathy Braun CSN: [id number]   DOB:03/12/1951    Age: 67 y.o. MRN: [id number]   Sex: female SSN: [id number]         Primary Rehab Diagnosis: Functional deficit secondary to THA/right   and Generalized Weakness and gait instability    Medical Issues: Active Problems:    * No active hospital problems. *      Other Diagnosis: No past medical history on file.      Subjective:     [facility].   Multiple medical problems status post right THA a secondary to advanced osteoarthritis.  Positive generalized weakness deconditioning and gait instability.  Working  with PT OT rehab and use rolling walker..  Patient reported pain was complicated . patient current taking hydromorphone 2-4 mg regularly around the clock for pain management.  Patient still complaining about the pain which was severe.   She denies any fever or chills.  No headache or dizziness.  No chest pain or palpitation.  No shortness of breath.  No nausea or vomiting.  Bowel movement seems normal.  Denies UTI symptoms.  Slightly anxious.  Good memory..    Objective:     Vitals stable/Afebrile, Systolic blood pressure pressure with normal limit   GEN: Alert and oriented times three.   Anxious  EYES:  Extraocular movements intact, conjunctiva normal, lids with out lesions.   HEAD: Normocephalic, Atraumatic  ENT:  Mucosa moist with no lesions noted. No facial droop noted.   NECK:   Supple, no JVD noted.   HEART:   Regular rate and rhythm   LUNGS , decreased breath sound clear bilaterally, no wheezing    ABDOMEN: + BS, soft NT/ND no organomegaly.   EXTREMITIES:   Positive right lower extremity edema decreased range of motion,  SKIN: no rashes   PSYCH:   Anxious  NEURO:  Ambulated with rolling walker With assistance   FUNCTION:   partial dependent ADL        LABS / ANCILLARY PROCEDURES: Labs reviewed     No results found for this or any previous visit (from the past 24 hour(s)).        Assessment / Plan     Status post THA / Right, pain management including Dilaudid, non pharmaceutical intervention such as ice or heat to the area for comfort, intensive PT OT rehab, follow-up with orthopedic surgeon    Pain management issues, complicated, continue relatively high dose of Dilaudid, discussed with patient to  increase to 2-1/2 pills as needed for pain. Discussed with patient regarding the long-acting pain medication such as MS Contin or OxyContin,, however, patient stated she had very severe  adverse effect in the past.                Gait instability, PT OT rehab, use rolling walker with assistance, fall precaution home safety issue discussion    Anxiety, behavioral  support and counseling , Continue Desvenlafaxine    Counseling, consultation discussion, chart review, care planning and coordination, discussed with and updated patient and staff      Signed By: Ortencia Kickhun Rui R Micha Erck, MD     June 14, 2018

## 2018-06-14 NOTE — Progress Notes (Signed)
Progress Notes by Ortencia KickZhao, Chun Rui R, MD at 06/14/18 1200                Author: Ortencia KickZhao, Chun Rui R, MD  Service: --  Author Type: Physician       Filed: 06/27/18 1951  Encounter Date: 06/14/2018  Status: Signed          Editor: Ortencia KickZhao, Chun Rui R, MD (Physician)                          [address] [place], [place]  [phone]      SUBACUTE REHABILITATION / [place]   Progress Note         Name: April MoseCathy Braun  CSN: [id number]        DOB:01/16/1951     Age: 67 y.o.  MRN: [id number]        Sex: female  SSN: [id number]              Primary Rehab Diagnosis: Functional deficit secondary to THA/right    and Generalized Weakness and gait instability      Medical Issues: Active Problems:     * No active hospital problems. *         Other Diagnosis: No past medical history on file.           Subjective:        [facility].   Multiple medical problems status post right THA a secondary to advanced osteoarthritis.  Positive generalized weakness deconditioning and gait instability.  Working  with PT  OT rehab and use rolling walker..  Patient reported pain was complicated . patient current taking hydromorphone 2-4 mg regularly around the clock for pain management.  Patient still complaining about the pain which was severe.   She denies any fever or  chills.  No headache or dizziness.  No chest pain or palpitation.  No shortness of breath.  No nausea or vomiting.  Bowel movement seems normal.  Denies UTI symptoms.  Slightly anxious.  Good memory..        Objective:        Vitals stable/Afebrile, Systolic blood pressure pressure with normal limit    GEN: Alert and oriented times three.   Anxious   EYES:  Extraocular movements intact, conjunctiva normal,  lids with out lesions.    HEAD: Normocephalic, Atraumatic   ENT:  Mucosa moist with no lesions noted. No facial droop noted.    NECK:   Supple, no JVD noted.    HEART:   Regular rate and rhythm    LUNGS , decreased breath sound clear  bilaterally, no wheezing      ABDOMEN: + BS, soft NT/ND no organomegaly.     EXTREMITIES:   Positive right lower extremity edema decreased range of motion,   SKIN: no rashes    PSYCH:   Anxious   NEURO:  Ambulated with rolling walker With assistance    FUNCTION:   partial dependent ADL            LABS / ANCILLARY PROCEDURES: Labs reviewed      No results found for this or any previous visit (from the past 24 hour(s)).              Assessment / Plan        Status post THA / Right, pain management including Dilaudid, non pharmaceutical intervention such as ice or heat to the  area for comfort, intensive PT OT rehab, follow-up with orthopedic surgeon      Pain management issues, complicated, continue relatively high dose of Dilaudid, discussed with patient to  increase to 2-1/2 pills as needed for pain. Discussed with patient regarding the long-acting pain medication such as MS Contin or  OxyContin,, however, patient stated she had very severe  adverse effect in the past.                  Gait instability, PT OT rehab, use rolling walker with assistance, fall precaution home safety issue discussion      Anxiety, behavioral  support and counseling , Continue Desvenlafaxine      Counseling, consultation discussion, chart review, care planning and coordination, discussed with and updated patient and staff            Signed By:  Ortencia Kick, MD           June 14, 2018

## 2018-06-17 ENCOUNTER — Ambulatory Visit: Admit: 2018-06-17 | Discharge: 2018-06-17 | Payer: MEDICARE | Attending: Geriatric Medicine | Primary: Internal Medicine

## 2018-06-17 ENCOUNTER — Ambulatory Visit: Attending: Geriatric Medicine | Primary: Internal Medicine

## 2018-06-17 DIAGNOSIS — Z96641 Presence of right artificial hip joint: Secondary | ICD-10-CM

## 2018-06-17 NOTE — Progress Notes (Signed)
[address] [place], [place]  [phone]    SUBACUTE REHABILITATION UNIT/[place]  Progress Note    Name: April MoseCathy Braun CSN: [id number]   DOB:06/03/1951    Age: 67 y.o. MRN: [id number]   Sex: female SSN: [id number]         Primary Rehab Diagnosis: Functional deficit secondary to hip replacement    Medical Issues: Active Problems:    * No active hospital problems. *      Other Diagnosis: No past medical history on file.      Subjective:     [facility]/followed up.  Status post left THA with complaining about severe pain, with increased activity or mobility.   patient complained about some pain which also can be moderate to severe occasionally when  patient was resting in the bed.  Patient somewhat had increased anxiousness however.  Trace still had significant weakness and gait instability.  Positive anxiousness and "horrible" experience with pain management.  Sleeping fair.    No fever or chills.  Denies any headache or dizziness.  No chest pain or palpitation.  No nausea or vomiting.  Patient had a bowel movement.  Denies any UTI symptoms.    Objective:       PHYSICAL EXAM      GEN: Alert and oriented times three. NAD.  anxious  EYES:  Extraocular movements intact, conjunctiva normal, lids with out lesions.   HEAD: Normocephalic, Atraumatic  ENT:  Mucosa moist with no lesions noted. No facial droop noted.   NECK:   Supple, no JVD noted.   HEART:   Regular rate and rhythm  LUNGS:   Decreased breath sound bilaterally, occasional wheezing  ABDOMEN: + BS, soft NT/ND no organomegaly.   EXTREMITIES:   Positive left lower extremity edema  SKIN: no rashes or skin breakdown.   PSYCH:   Anxiousness  NEURO:  Gait instability and with rolling walker  FUNCTION:   Partial dependent ADL    Assessment / Plan:    Status post left anterior approach   left THA, secondary to osteoarthritis, continue PT OT rehab and assistance, use rolling walker     Severe pain,  in the left hip and back area,  Worsening, recheck x-ray of left hip and lumbar area, increase Dilaudid to 2-1/2 pills for pain equal or greater than 7 on the skilled 10, non pharmaceutical intervention such as ice or heat pack to the area for comfort, may also consider increase gabapentin if pain is not improved    Gait instability, fall precaution home safety issue discussion, continue PT OT rehab, use rolling walker,    Anxiety, behavior support and counseling, relaxation and decreased stress, continue temazepam, discussed regarding SSRI    Counseling consultation discussion all questions and concerns addressed, patient expressed understanding and agreed with recommendation treatment    Total care time over 35 min                Signed By: Ortencia Kickhun Rui R Oneisha Ammons, MD     June 17, 2018

## 2018-06-17 NOTE — Progress Notes (Signed)
Progress Notes by Ortencia KickZhao, Chun Rui R, MD at 06/17/18 0920                Author: Ortencia KickZhao, Chun Rui R, MD  Service: --  Author Type: Physician       Filed: 06/26/18 1518  Encounter Date: 06/17/2018  Status: Signed          Editor: Ortencia KickZhao, Chun Rui R, MD (Physician)                          [address] [place], [place]  [phone]      SUBACUTE REHABILITATION UNIT/[place]   Progress Note         Name: April Braun  CSN: [id number]        DOB:06/21/1951     Age: 67 y.o.  MRN: [id number]        Sex: female  SSN: [id number]              Primary Rehab Diagnosis: Functional deficit secondary to hip replacement      Medical Issues: Active Problems:     * No active hospital problems. *         Other Diagnosis: No past medical history on file.           Subjective:        [facility]/followed up.  Status post left THA with complaining about severe pain, with increased activity or mobility.   patient complained about some pain which also can  be moderate to severe occasionally when  patient was resting in the bed.  Patient somewhat had increased anxiousness however.  Trace still had significant weakness and gait instability.  Positive anxiousness and "horrible" experience with pain management.   Sleeping fair.      No fever or chills.  Denies any headache or dizziness.  No chest pain or palpitation.  No nausea or vomiting.  Patient had a bowel movement.  Denies any UTI symptoms.        Objective:           PHYSICAL EXAM         GEN: Alert and oriented times three. NAD.  anxious   EYES:  Extraocular movements intact, conjunctiva normal,  lids with out lesions.    HEAD: Normocephalic, Atraumatic   ENT:  Mucosa moist with no lesions noted. No facial droop noted.    NECK:   Supple, no JVD noted.    HEART:   Regular rate and rhythm   LUNGS:   Decreased breath sound bilaterally, occasional wheezing   ABDOMEN: + BS, soft NT/ND no organomegaly.     EXTREMITIES:   Positive left  lower extremity edema   SKIN: no rashes or skin breakdown.    PSYCH:   Anxiousness   NEURO:  Gait instability and with rolling walker   FUNCTION:   Partial dependent ADL      Assessment / Plan:      Status post left anterior approach   left THA, secondary to osteoarthritis, continue PT OT rehab and assistance, use rolling walker      Severe pain,  in the left hip and back area,  Worsening, recheck x-ray of left hip and lumbar area, increase Dilaudid to 2-1/2 pills for pain equal or greater than 7 on the skilled 10, non pharmaceutical intervention such as ice or heat  pack to the area for comfort, may also  consider increase gabapentin if pain is not improved      Gait instability, fall precaution home safety issue discussion, continue PT OT rehab, use rolling walker,      Anxiety, behavior support and counseling, relaxation and decreased stress, continue temazepam, discussed regarding SSRI      Counseling consultation discussion all questions and concerns addressed, patient expressed understanding and agreed with recommendation treatment      Total care time over 35 min                           Signed By:  Ortencia Kick, MD           June 17, 2018

## 2018-06-18 ENCOUNTER — Ambulatory Visit: Admit: 2018-06-18 | Discharge: 2018-06-18 | Payer: MEDICARE | Attending: Family | Primary: Internal Medicine

## 2018-06-18 ENCOUNTER — Ambulatory Visit: Attending: Family | Primary: Internal Medicine

## 2018-06-18 DIAGNOSIS — G8918 Other acute postprocedural pain: Secondary | ICD-10-CM

## 2018-06-18 DIAGNOSIS — I10 Essential (primary) hypertension: Secondary | ICD-10-CM | POA: Insufficient documentation

## 2018-06-18 DIAGNOSIS — K219 Gastro-esophageal reflux disease without esophagitis: Secondary | ICD-10-CM | POA: Insufficient documentation

## 2018-06-18 HISTORY — DX: Gastro-esophageal reflux disease without esophagitis: K21.9

## 2018-06-18 NOTE — Progress Notes (Signed)
Subjective:       HPI; Patient is a 67 y/o female, [facility] [place] [facility], and for pain.  Right Hip Pelvis Conclusion:  Intact right and left hip arthroplasty.  She is s/p right hip arthroplasty.  Pain level 7/10, stabbing / aching pain, pain is relieved with medication.   Patient was seen by MD for pain and xray and dilaudid was increased to 5mg  q4 hrs/prn.  Patient also has htn, hypothyroidism, anemia, and gerd.    Patient Active Problem List   Diagnosis Code   ??? Post-op pain G89.18   ??? HTN (hypertension) I10   ??? Acquired hypothyroidism E03.9   ??? Insomnia G47.00   ??? GERD (gastroesophageal reflux disease) K21.9     Current Outpatient Medications   Medication Sig Dispense Refill   ??? aspirin 81 mg chewable tablet Take 81 mg by mouth daily.     ??? desvenlafaxine fumarate 100 mg tr24 Take  by mouth daily.     ??? ergocalciferol (ERGOCALCIFEROL) 50,000 unit capsule Take 50,000 Units by mouth daily.     ??? ferrous sulfate 325 mg (65 mg iron) tablet Take  by mouth Daily (before breakfast).     ??? gabapentin (NEURONTIN) 300 mg capsule Take 300 mg by mouth three (3) times daily.     ??? HYDROmorphone (DILAUDID) 4 mg tablet Take 2 mg by mouth every four (4) hours as needed for Pain. Indications: give 2.5 tabs q4 hrs/prn.     ??? levothyroxine (SYNTHROID) 50 mcg tablet Take  by mouth Daily (before breakfast).     ??? lisinopril-hydroCHLOROthiazide (PRINZIDE, ZESTORETIC) 10-12.5 mg per tablet Take  by mouth daily.     ??? magnesium hydroxide (MILK OF MAGNESIA) 400 mg/5 mL suspension Take 30 mL by mouth daily as needed for Constipation.     ??? naproxen (NAPROSYN) 500 mg tablet Take 500 mg by mouth two (2) times daily (with meals).     ??? Omeprazole delayed release (PRILOSEC D/R) 20 mg tablet Take 20 mg by mouth daily.     ??? polyethylene glycol (MIRALAX) 17 gram/dose powder Take 17 g by mouth two (2) times a day.     ??? senna-docusate (SENNA PLUS) 8.6-50 mg per tablet Take 1 Tab by mouth daily.      ??? temazepam (RESTORIL) 30 mg capsule Take  by mouth nightly.     ??? acetaminophen (TYLENOL) 500 mg tablet Take 1,000 mg by mouth every eight (8) hours as needed for Pain.     ??? acetaminophen (TYLENOL) 325 mg tablet Take 650 mg by mouth every eight (8) hours as needed for Pain.       Allergies   Allergen Reactions   ??? Compazine [Prochlorperazine] Unknown (comments)   ??? Lyrica [Pregabalin] Unknown (comments)   ??? Phenothiazines Unknown (comments)   ??? Prednisone Unknown (comments)     No past medical history on file.  No past surgical history on file.  No family history on file.  Social History     Tobacco Use   ??? Smoking status: Not on file   Substance Use Topics   ??? Alcohol use: Not on file      Review of Systems    A comprehensive review of systems was negative except for that written in the HPI.     Objective:     Visit Vitals  BP 125/78   Pulse 75   Temp 97.7 ??F (36.5 ??C)   Resp 18  SpO2 98%      Patient seen laying down, she appeared in no acute distress (even though she stated that pain level was 7/10 and constant. Patient was smiling when she reported pain level as 7/10, and her vital signs were stable with BP of 125/78, pulse 75.  Head is atraumatic, neck supple without carotid bruits, or jugular distention, normal heart and rhythm, lung sounds clear, no rales, crackles or wheezing. Abdomen soft, non tender, normal bowel sounds, skin warm and dry, right upper thigh dressing clean and dry, surrounding tissue without redness, no swelling noted.  RLE ROM limited. Cranial nerves and sensorimotor intact.  Patient oriented x3, calm and cooperative.   Patient was educated about pain medications and pain management.    Assessment/Plan:       ICD-10-CM ICD-9-CM    1. Post-op pain G89.18 338.18    2. Hypertension, unspecified type I10 401.9    3. Acquired hypothyroidism E03.9 244.9    4. Anemia, unspecified type D64.9 285.9    5. Gastroesophageal reflux disease without esophagitis K21.9 530.81        1. Post op pain, controlled, continue dilaudid, continue apap, continue gabapentin, and continue naproxen.      2. Hypertension, managed, continue lisinopril-hctz.    3. Hypothyroidism, continue levothyroxine.and monitor.        4. Anemia, skin color WNL, no sos, continue ferrous sulfate.      5. GERD, continue omeprazole, and continue to monitor.        Derrek Gu, FNP

## 2018-06-18 NOTE — Progress Notes (Signed)
Subjective:       HPI; Patient is a 67 y/o female, being seen today at [place] at [facility] to review X-ray result, and for pain.  Right Hip Pelvis Conclusion:  Intact right and left hip arthroplasty.  She is s/p right hip arthroplasty.  Pain level 7/10, stabbing / aching pain, pain is relieved with medication.   Patient was seen by MD for pain and xray and dilaudid was increased to 5mg  q4 hrs/prn.  Patient also has htn, hypothyroidism, anemia, and gerd.    Patient Active Problem List   Diagnosis Code   ??? Post-op pain G89.18   ??? HTN (hypertension) I10   ??? Acquired hypothyroidism E03.9   ??? Insomnia G47.00   ??? GERD (gastroesophageal reflux disease) K21.9     Current Outpatient Medications   Medication Sig Dispense Refill   ??? aspirin 81 mg chewable tablet Take 81 mg by mouth daily.     ??? desvenlafaxine fumarate 100 mg tr24 Take  by mouth daily.     ??? ergocalciferol (ERGOCALCIFEROL) 50,000 unit capsule Take 50,000 Units by mouth daily.     ??? ferrous sulfate 325 mg (65 mg iron) tablet Take  by mouth Daily (before breakfast).     ??? gabapentin (NEURONTIN) 300 mg capsule Take 300 mg by mouth three (3) times daily.     ??? HYDROmorphone (DILAUDID) 4 mg tablet Take 2 mg by mouth every four (4) hours as needed for Pain. Indications: give 2.5 tabs q4 hrs/prn.     ??? levothyroxine (SYNTHROID) 50 mcg tablet Take  by mouth Daily (before breakfast).     ??? lisinopril-hydroCHLOROthiazide (PRINZIDE, ZESTORETIC) 10-12.5 mg per tablet Take  by mouth daily.     ??? magnesium hydroxide (MILK OF MAGNESIA) 400 mg/5 mL suspension Take 30 mL by mouth daily as needed for Constipation.     ??? naproxen (NAPROSYN) 500 mg tablet Take 500 mg by mouth two (2) times daily (with meals).     ??? Omeprazole delayed release (PRILOSEC D/R) 20 mg tablet Take 20 mg by mouth daily.     ??? polyethylene glycol (MIRALAX) 17 gram/dose powder Take 17 g by mouth two (2) times a day.     ??? senna-docusate (SENNA PLUS) 8.6-50 mg per tablet Take 1 Tab by mouth daily.     ???  temazepam (RESTORIL) 30 mg capsule Take  by mouth nightly.     ??? acetaminophen (TYLENOL) 500 mg tablet Take 1,000 mg by mouth every eight (8) hours as needed for Pain.     ??? acetaminophen (TYLENOL) 325 mg tablet Take 650 mg by mouth every eight (8) hours as needed for Pain.       Allergies   Allergen Reactions   ??? Compazine [Prochlorperazine] Unknown (comments)   ??? Lyrica [Pregabalin] Unknown (comments)   ??? Phenothiazines Unknown (comments)   ??? Prednisone Unknown (comments)     No past medical history on file.  No past surgical history on file.  No family history on file.  Social History     Tobacco Use   ??? Smoking status: Not on file   Substance Use Topics   ??? Alcohol use: Not on file      Review of Systems    A comprehensive review of systems was negative except for that written in the HPI.     Objective:     Visit Vitals  BP 125/78   Pulse 75   Temp 97.7 ??F (36.5 ??C)   Resp 18  SpO2 98%      Patient seen laying down, she appeared in no acute distress (even though she stated that pain level was 7/10 and constant. Patient was smiling when she reported pain level as 7/10, and her vital signs were stable with BP of 125/78, pulse 75.  Head is atraumatic, neck supple without carotid bruits, or jugular distention, normal heart and rhythm, lung sounds clear, no rales, crackles or wheezing. Abdomen soft, non tender, normal bowel sounds, skin warm and dry, right upper thigh dressing clean and dry, surrounding tissue without redness, no swelling noted.  RLE ROM limited. Cranial nerves and sensorimotor intact.  Patient oriented x3, calm and cooperative.   Patient was educated about pain medications and pain management.    Assessment/Plan:       ICD-10-CM ICD-9-CM    1. Post-op pain G89.18 338.18    2. Hypertension, unspecified type I10 401.9    3. Acquired hypothyroidism E03.9 244.9    4. Anemia, unspecified type D64.9 285.9    5. Gastroesophageal reflux disease without esophagitis K21.9 530.81       1. Post op pain,  controlled, continue dilaudid, continue apap, continue gabapentin, and continue naproxen.      2. Hypertension, managed, continue lisinopril-hctz.    3. Hypothyroidism, continue levothyroxine.and monitor.        4. Anemia, skin color WNL, no sos, continue ferrous sulfate.      5. GERD, continue omeprazole, and continue to monitor.        Derrek GuFlorence P Ravi Tuccillo, FNP

## 2018-06-20 ENCOUNTER — Ambulatory Visit: Admit: 2018-06-20 | Discharge: 2018-06-20 | Payer: MEDICARE | Attending: Family | Primary: Internal Medicine

## 2018-06-20 ENCOUNTER — Ambulatory Visit: Attending: Family | Primary: Internal Medicine

## 2018-06-20 DIAGNOSIS — R52 Pain, unspecified: Secondary | ICD-10-CM

## 2018-06-20 NOTE — Progress Notes (Signed)
Subjective:     HPI:  Patient is a 67 year old female currently staying at [facility] [place], and is being seen today for pain.   She stated that pain was better, and rates pain as 6/10, pain described as sharp and sometimes an ache.  Pain is relieved with medication, which consist of dilaudid 4mg q4hrs/prn, gabapentin300mg tid, naproxen 500mg bid, and apap 1000mg q8 hrs/prn.        Patient Active Problem List   Diagnosis Code   ??? Post-op pain G89.18   ??? HTN (hypertension) I10   ??? Acquired hypothyroidism E03.9   ??? Insomnia G47.00   ??? GERD (gastroesophageal reflux disease) K21.9   ??? Anemia D64.9     Current Outpatient Medications   Medication Sig Dispense Refill   ??? aspirin 81 mg chewable tablet Take 81 mg by mouth daily.     ??? desvenlafaxine fumarate 100 mg tr24 Take  by mouth daily.     ??? ergocalciferol (ERGOCALCIFEROL) 50,000 unit capsule Take 50,000 Units by mouth daily.     ??? ferrous sulfate 325 mg (65 mg iron) tablet Take  by mouth Daily (before breakfast).     ??? gabapentin (NEURONTIN) 300 mg capsule Take 300 mg by mouth three (3) times daily.     ??? HYDROmorphone (DILAUDID) 4 mg tablet Take 2 mg by mouth every four (4) hours as needed for Pain. Indications: give 2.5 tabs q4 hrs/prn.     ??? levothyroxine (SYNTHROID) 50 mcg tablet Take  by mouth Daily (before breakfast).     ??? lisinopril-hydroCHLOROthiazide (PRINZIDE, ZESTORETIC) 10-12.5 mg per tablet Take  by mouth daily.     ??? magnesium hydroxide (MILK OF MAGNESIA) 400 mg/5 mL suspension Take 30 mL by mouth daily as needed for Constipation.     ??? naproxen (NAPROSYN) 500 mg tablet Take 500 mg by mouth two (2) times daily (with meals).     ??? Omeprazole delayed release (PRILOSEC D/R) 20 mg tablet Take 20 mg by mouth daily.     ??? polyethylene glycol (MIRALAX) 17 gram/dose powder Take 17 g by mouth two (2) times a day.     ??? senna-docusate (SENNA PLUS) 8.6-50 mg per tablet Take 1 Tab by mouth daily.     ??? temazepam (RESTORIL) 30 mg capsule Take  by mouth nightly.      ??? acetaminophen (TYLENOL) 500 mg tablet Take 1,000 mg by mouth every eight (8) hours as needed for Pain.     ??? acetaminophen (TYLENOL) 325 mg tablet Take 650 mg by mouth every eight (8) hours as needed for Pain.       Allergies   Allergen Reactions   ??? Compazine [Prochlorperazine] Unknown (comments)   ??? Lyrica [Pregabalin] Unknown (comments)   ??? Phenothiazines Unknown (comments)   ??? Prednisone Unknown (comments)     No past medical history on file.  No past surgical history on file.  No family history on file.  Social History     Tobacco Use   ??? Smoking status: Not on file   Substance Use Topics   ??? Alcohol use: Not on file      Review of Systems    A comprehensive review of systems was negative except for that written in the HPI.     Objective:     Visit Vitals  BP 117/63   Pulse 80   Temp 97.3 ??F (36.3 ??C)   Resp 18   SpO2 98%      Patient   seen sitting up, she is awake, alert, and oriented, she appeared in no distress.  Normal S1 and S2, no murmur.  Lung sounds clear to auscultation.  Abdomen is soft, non tender, with normal bowel sounds throughout.  Right hip surgical site proximal area with small open area, area open, slightly red moist with yellow slough, scant amount yellow drainage.     Assessment/Plan:       ICD-10-CM ICD-9-CM    1. Pain R52 780.96      Pain, improved, continue dilaudid, continue naproxen, continue gabapentin, continue naproxen, and continue apap.      Avayah Raffety P Zylah Elsbernd, FNP

## 2018-06-20 NOTE — Progress Notes (Signed)
Subjective:     HPI:  Patient is a 67 year old female currently staying at [facility] at [place], and is being seen today for pain.   She stated that pain was better, and rates pain as 6/10, pain described as sharp and sometimes an ache.  Pain is relieved with medication, which consist of dilaudid 4mg  q4hrs/prn, gabapentin300mg  tid, naproxen 500mg  bid, and apap 1000mg  q8 hrs/prn.        Patient Active Problem List   Diagnosis Code   ??? Post-op pain G89.18   ??? HTN (hypertension) I10   ??? Acquired hypothyroidism E03.9   ??? Insomnia G47.00   ??? GERD (gastroesophageal reflux disease) K21.9   ??? Anemia D64.9     Current Outpatient Medications   Medication Sig Dispense Refill   ??? aspirin 81 mg chewable tablet Take 81 mg by mouth daily.     ??? desvenlafaxine fumarate 100 mg tr24 Take  by mouth daily.     ??? ergocalciferol (ERGOCALCIFEROL) 50,000 unit capsule Take 50,000 Units by mouth daily.     ??? ferrous sulfate 325 mg (65 mg iron) tablet Take  by mouth Daily (before breakfast).     ??? gabapentin (NEURONTIN) 300 mg capsule Take 300 mg by mouth three (3) times daily.     ??? HYDROmorphone (DILAUDID) 4 mg tablet Take 2 mg by mouth every four (4) hours as needed for Pain. Indications: give 2.5 tabs q4 hrs/prn.     ??? levothyroxine (SYNTHROID) 50 mcg tablet Take  by mouth Daily (before breakfast).     ??? lisinopril-hydroCHLOROthiazide (PRINZIDE, ZESTORETIC) 10-12.5 mg per tablet Take  by mouth daily.     ??? magnesium hydroxide (MILK OF MAGNESIA) 400 mg/5 mL suspension Take 30 mL by mouth daily as needed for Constipation.     ??? naproxen (NAPROSYN) 500 mg tablet Take 500 mg by mouth two (2) times daily (with meals).     ??? Omeprazole delayed release (PRILOSEC D/R) 20 mg tablet Take 20 mg by mouth daily.     ??? polyethylene glycol (MIRALAX) 17 gram/dose powder Take 17 g by mouth two (2) times a day.     ??? senna-docusate (SENNA PLUS) 8.6-50 mg per tablet Take 1 Tab by mouth daily.     ??? temazepam (RESTORIL) 30 mg capsule Take  by mouth nightly.      ??? acetaminophen (TYLENOL) 500 mg tablet Take 1,000 mg by mouth every eight (8) hours as needed for Pain.     ??? acetaminophen (TYLENOL) 325 mg tablet Take 650 mg by mouth every eight (8) hours as needed for Pain.       Allergies   Allergen Reactions   ??? Compazine [Prochlorperazine] Unknown (comments)   ??? Lyrica [Pregabalin] Unknown (comments)   ??? Phenothiazines Unknown (comments)   ??? Prednisone Unknown (comments)     No past medical history on file.  No past surgical history on file.  No family history on file.  Social History     Tobacco Use   ??? Smoking status: Not on file   Substance Use Topics   ??? Alcohol use: Not on file      Review of Systems    A comprehensive review of systems was negative except for that written in the HPI.     Objective:     Visit Vitals  BP 117/63   Pulse 80   Temp 97.3 ??F (36.3 ??C)   Resp 18   SpO2 98%      Patient  seen sitting up, she is awake, alert, and oriented, she appeared in no distress.  Normal S1 and S2, no murmur.  Lung sounds clear to auscultation.  Abdomen is soft, non tender, with normal bowel sounds throughout.  Right hip surgical site proximal area with small open area, area open, slightly red moist with yellow slough, scant amount yellow drainage.     Assessment/Plan:       ICD-10-CM ICD-9-CM    1. Pain R52 780.96      Pain, improved, continue dilaudid, continue naproxen, continue gabapentin, continue naproxen, and continue apap.      Derrek GuFlorence P Maddisen Vought, FNP

## 2018-06-21 ENCOUNTER — Ambulatory Visit: Admit: 2018-06-21 | Discharge: 2018-06-21 | Payer: MEDICARE | Attending: Geriatric Medicine | Primary: Internal Medicine

## 2018-06-21 ENCOUNTER — Ambulatory Visit: Attending: Geriatric Medicine | Primary: Internal Medicine

## 2018-06-21 DIAGNOSIS — Z96641 Presence of right artificial hip joint: Secondary | ICD-10-CM

## 2018-06-21 NOTE — Progress Notes (Signed)
[facility]  DISCHARGE SUMMARY      Patient ID:  April MoseCathy Park  [id number]  67 y.o.  02/25/1951    @PRIMARYDIAGNOSIS @   Status post right THA  Pain management  Gait instability    Secondary Dx:   Anxiety/depression  Hypertension  GERD  Anemia  Hypothyroidism    Procedures & Therapies:      @HOSPCOURSENOTE @     Brief SNF Course:  Patient admitted to subacute rehab for PT OT status post right THA.  Complicated patient with pain management regimen P patient eventually status with Dilaudid.  Her ADL and ambulation mobility was also improved with PT OT and rehab.  Patient achieve ambulation without assistive devices/independent without supervision.    No past medical history on file.  No past surgical history on file.  Current Outpatient Medications on File Prior to Visit   Medication Sig Dispense Refill   ??? aspirin 81 mg chewable tablet Take 81 mg by mouth daily.     ??? desvenlafaxine fumarate 100 mg tr24 Take  by mouth daily.     ??? ergocalciferol (ERGOCALCIFEROL) 50,000 unit capsule Take 50,000 Units by mouth daily.     ??? ferrous sulfate 325 mg (65 mg iron) tablet Take  by mouth Daily (before breakfast).     ??? gabapentin (NEURONTIN) 300 mg capsule Take 300 mg by mouth three (3) times daily.     ??? HYDROmorphone (DILAUDID) 4 mg tablet Take 2 mg by mouth every four (4) hours as needed for Pain. Indications: give 2.5 tabs q4 hrs/prn.     ??? levothyroxine (SYNTHROID) 50 mcg tablet Take  by mouth Daily (before breakfast).     ??? lisinopril-hydroCHLOROthiazide (PRINZIDE, ZESTORETIC) 10-12.5 mg per tablet Take  by mouth daily.     ??? magnesium hydroxide (MILK OF MAGNESIA) 400 mg/5 mL suspension Take 30 mL by mouth daily as needed for Constipation.     ??? naproxen (NAPROSYN) 500 mg tablet Take 500 mg by mouth two (2) times daily (with meals).     ??? Omeprazole delayed release (PRILOSEC D/R) 20 mg tablet Take 20 mg by mouth daily.     ??? polyethylene glycol (MIRALAX) 17 gram/dose powder Take 17 g by mouth two (2) times a day.      ??? senna-docusate (SENNA PLUS) 8.6-50 mg per tablet Take 1 Tab by mouth daily.     ??? temazepam (RESTORIL) 30 mg capsule Take  by mouth nightly.     ??? acetaminophen (TYLENOL) 500 mg tablet Take 1,000 mg by mouth every eight (8) hours as needed for Pain.     ??? acetaminophen (TYLENOL) 325 mg tablet Take 650 mg by mouth every eight (8) hours as needed for Pain.       No current facility-administered medications on file prior to visit.       ROS    fever or chills.  No headache or dizziness.  No chest pain or palpitation.  Positive anxious.  Ambulation improved with lost without assistive devices.  Memory stable    Physical Exam    vital reviewed as noticed, afebrile, SBP was normal  Alert  Op moist and pink   neck is supple   decreased breath sounds bilaterally clear  regular heart rate and rhythm   soft bowel sounds present   no suprapubic tenderness   positive bilateral lower extremity edema right a full right more than left,  Ambulation without difficulty   slightly anxious, good recall    Patient Instructions  Discharge home with the home health agency/PT OT    Fall prevention and safety discussion and education    Follow-up with PCP in 1 week, and orthopedic surgeon as scheduled      Take Home Medications     As per medication list      What to do at Home    Recommended diet: Cardiac Diet,     Recommended activity: Activity as tolerated,       If you experience any of the following symptoms of chest pain or signs stroke heart attack or shortness breath, call 911        Signed By: Ortencia Kick, MD   Geriatric Medicine    June 21, 2018

## 2018-06-21 NOTE — Progress Notes (Signed)
[facility]  [facility]      Patient ID:  April MoseCathy Braun  [id number]  67 y.o.  09/06/1951    @PRIMARYDIAGNOSIS @   Status post right THA  Pain management  Gait instability    Secondary Dx:   Anxiety/depression  Hypertension  GERD  Anemia  Hypothyroidism    Procedures & Therapies:      @HOSPCOURSENOTE @     Brief SNF Course:  Patient admitted to subacute rehab for PT OT status post right THA.  Complicated patient with pain management regimen P patient eventually status with Dilaudid.  Her ADL and ambulation mobility was also improved with PT OT and rehab.  Patient achieve ambulation without assistive devices/independent without supervision.    No past medical history on file.  No past surgical history on file.  Current Outpatient Medications on File Prior to Visit   Medication Sig Dispense Refill   ??? aspirin 81 mg chewable tablet Take 81 mg by mouth daily.     ??? desvenlafaxine fumarate 100 mg tr24 Take  by mouth daily.     ??? ergocalciferol (ERGOCALCIFEROL) 50,000 unit capsule Take 50,000 Units by mouth daily.     ??? ferrous sulfate 325 mg (65 mg iron) tablet Take  by mouth Daily (before breakfast).     ??? gabapentin (NEURONTIN) 300 mg capsule Take 300 mg by mouth three (3) times daily.     ??? HYDROmorphone (DILAUDID) 4 mg tablet Take 2 mg by mouth every four (4) hours as needed for Pain. Indications: give 2.5 tabs q4 hrs/prn.     ??? levothyroxine (SYNTHROID) 50 mcg tablet Take  by mouth Daily (before breakfast).     ??? lisinopril-hydroCHLOROthiazide (PRINZIDE, ZESTORETIC) 10-12.5 mg per tablet Take  by mouth daily.     ??? magnesium hydroxide (MILK OF MAGNESIA) 400 mg/5 mL suspension Take 30 mL by mouth daily as needed for Constipation.     ??? naproxen (NAPROSYN) 500 mg tablet Take 500 mg by mouth two (2) times daily (with meals).     ??? Omeprazole delayed release (PRILOSEC D/R) 20 mg tablet Take 20 mg by mouth daily.     ??? polyethylene glycol (MIRALAX) 17 gram/dose powder Take 17 g by mouth two (2) times a day.     ???  senna-docusate (SENNA PLUS) 8.6-50 mg per tablet Take 1 Tab by mouth daily.     ??? temazepam (RESTORIL) 30 mg capsule Take  by mouth nightly.     ??? acetaminophen (TYLENOL) 500 mg tablet Take 1,000 mg by mouth every eight (8) hours as needed for Pain.     ??? acetaminophen (TYLENOL) 325 mg tablet Take 650 mg by mouth every eight (8) hours as needed for Pain.       No current facility-administered medications on file prior to visit.       ROS    fever or chills.  No headache or dizziness.  No chest pain or palpitation.  Positive anxious.  Ambulation improved with lost without assistive devices.  Memory stable    Physical Exam    vital reviewed as noticed, afebrile, SBP was normal  Alert  Op moist and pink   neck is supple   decreased breath sounds bilaterally clear  regular heart rate and rhythm   soft bowel sounds present   no suprapubic tenderness   positive bilateral lower extremity edema right a full right more than left,  Ambulation without difficulty   slightly anxious, good recall    Patient Instructions  Discharge home with the home health agency/PT OT    Fall prevention and safety discussion and education    Follow-up with PCP in 1 week, and orthopedic surgeon as scheduled      Take Home Medications     As per medication list      What to do at Home    Recommended diet: Cardiac Diet,     Recommended activity: Activity as tolerated,       If you experience any of the following symptoms of chest pain or signs stroke heart attack or shortness breath, call 911        Signed By: Ortencia Kick, MD   Geriatric Medicine    June 21, 2018

## 2018-06-26 DIAGNOSIS — Z96643 Presence of artificial hip joint, bilateral: Secondary | ICD-10-CM | POA: Insufficient documentation

## 2018-06-26 DIAGNOSIS — Z96641 Presence of right artificial hip joint: Secondary | ICD-10-CM

## 2018-06-26 HISTORY — DX: Presence of right artificial hip joint: Z96.641

## 2018-07-18 ENCOUNTER — Ambulatory Visit: Admitting: Neurological Surgery

## 2018-07-18 LAB — HX BASIC METABOLIC PANEL
CASE NUMBER: 2019234001156
HX ANION GAP: 11 (ref 3.0–11.0)
HX BUN: 16 mg/dL (ref 8.0–23.0)
HX CALCIUM LVL: 9.2 mg/dL (ref 8.5–10.5)
HX CHLORIDE: 103 mmol/L (ref 98.0–110.0)
HX CO2: 26 mmol/L (ref 21.0–32.0)
HX CREATININE: 0.761 mg/dL (ref 0.55–1.3)
HX GLUCOSE LVL: 97 mg/dL (ref 70.0–110.0)
HX POTASSIUM LVL: 4.4 mmol/L (ref 3.6–5.2)
HX SODIUM LVL: 140 mmol/L (ref 136.0–146.0)

## 2018-07-18 LAB — HX GLOMERULAR FILTRATION RATE (ESTIMATED)
CASE NUMBER: 2019234001156
HX AFN AMER GLOMERULAR FILTRATION RATE: >90
HX NON-AFN AMER GLOMERULAR FILTRATION RATE: 81 mL/min/{1.73_m2}

## 2018-10-31 LAB — LIPID PROFILE (EXT)
Chol/HDL Ratio (EXT): 2.9 ratio
Cholesterol (EXT): 197 mg/dL
HDL Cholesterol (EXT): 69 mg/dL
LDL Cholesterol, CALC (EXT): 117 mg/dL
Triglycerides (EXT): 56 mg/dL

## 2018-11-28 ENCOUNTER — Ambulatory Visit: Admitting: Neurological Surgery

## 2018-11-28 ENCOUNTER — Ambulatory Visit

## 2018-11-28 ENCOUNTER — Ambulatory Visit (HOSPITAL_BASED_OUTPATIENT_CLINIC_OR_DEPARTMENT_OTHER): Admitting: Psychiatry

## 2018-11-28 NOTE — Progress Notes (Signed)
 .  Progress Notes  .  Patient: Toni Butler  Provider: Corinna Butler I   .  DOB: 1951/06/23 Age: 68 Y Sex: Female  .  PCP: Toni Butler    Date: 11/28/2018  .  --------------------------------------------------------------------------------  .  REASON FOR APPOINTMENT  .  1. Neck pain and bilateral arm pain (right worse than left)  .  2. Low back pain and bilateral leg pain (right worse than the  left)  .  HISTORY OF PRESENT ILLNESS  .  NEUROSURGERY:  68 year old female presents in  initial neurosurgical consultation with low back and bilateral  leg pain (right worse than left). She has numbness in her feet as  well. Her legs and feet feel like "pins and needles". She has  history of L2-5 laminectomies and laminoforaminotomies in  February 2018 by Toni Butler. She has history of spinal cord  stimulator placement which has since been removed two years ago.  She also complains of neck pain and bilateral arm pain (right  worse than left). She has been going to physical therapy for the  past year. She has undergone multiple steroid injections years  ago in the neck and lower back. She thinks that she has gait  problems. She feels "wobbly" when going downstairs. She denies  bowel or bladder dysfunction. She has a rheumatologist. She has  psoriatic arthritis. She is not on any biologics (humira,  methotrexate, etc.).  Marland Kitchen  CURRENT MEDICATIONS  .  Taking Betamethasone Valerate 0.12 % Foam APPLY TOPICALLY BID  PRN. 2 WEEKS ON AND 1 WEEK OFF. REPEAT PRF FLARES External  Taking Desvenlafaxine Succinate ER 100 MG Tablet Extended Release  24 Hour TK 1 T PO QAM Oral  Taking Ferrous Sulfate 325 (65 Fe) MG Tablet 1 tablet Orally Once  a day  Taking Fluocinolone Acetonide Scalp 0.01 % Oil APPLY TO SCALP AND  ALLOW TO SIT OVER NIGHT D External  Taking Lisinopril-Hydrochlorothiazide 10-12.5 MG Tablet TK 1 T PO  D Oral  Taking Nystatin 100000 UNIT/GM Powder 1 application Externally  Twice a day  Taking Oxycodone-Acetaminophen 5-325  MG Tablet (Schedule II Drug)  Oral  Taking Temazepam 30 MG Capsule (Schedule IV Drug) TK 1 C PO HS  PRF INSOMNIA Oral  Taking Triamcinolone Acetonide 0.1 % Lotion APPLY TOPICALLY BID  PRN External  Taking Tumeric Root Extract , Notes: "tumeric with black pepper"  500 mg BID  Taking Vitamin D (Ergocalciferol) 1.25 MG (50000 UT) Capsule TK 1  C PO 1 TIME A WK Oral  Medication List reviewed and reconciled with the patient  .  PAST MEDICAL HISTORY  .  Psoriasis  Psoriatic arthritis  Hypertension  Anemia  Depression  .  ALLERGIES  .  Compazine: anaphylaxis - Allergy  PredniSONE: Other - Side Effects  .  SURGICAL HISTORY  .  C6-7 ACDF (anterior cervical diskectomy and fusion) (Dr. Loleta Butler at [facility]) 2009?  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  SOCIAL HISTORY  .  .  Tobaccohistory:Never smoked.  Marland Kitchen  HOSPITALIZATION/MAJOR DIAGNOSTIC PROCEDURE  .  see surgeries  .  REVIEW OF SYSTEMS  .  GENERAL:  .  Negative for:    weight loss within the past year,, weight gain  within the past year,, chest pain,, irregular heart beats,, heart  murmurs,, nausea / vomiting,, easy bruising,, fever / chills,,  frequent nose bleeds,, hoarse voice,, frequent headaches,,  shortness of breath,, breast discharge,, kidney disease,, liver  disease,, previous anesthesiea problems,, excess bleeding during  or after prior procedure, .  .  NEUROLOGY:  .  Positive for:    trouble with balance and coordination,, back  pain, weakness .  Marland Kitchen  VITAL SIGNS  .  Pain scale 9, Ht-in 64, Wt-lbs 193, BMI 33.12.  Marland Kitchen  PHYSICAL EXAMINATION  .  NEUROSURGERY:  MOTOR Upper Extremities  : 5/5 strength in the bilateral upper  extremities. :5/5 strength in the bilateral upper extremities .  MOTOR  Lower Extremities  : 5/5 strength in the bilateral lower  extremities. :5/5 strength in the bilateral lower extremities.  Reflexes  Left Bicep 2+, Right Bicep 2+, Left Brachioradialis 2+,  Right Brachioradialis 2+, Left Triceps 2+, Right Triceps 2+, Left  Knee Jerk 2+, Right Knee Jerk 2+, Left Ankle Jerk Absent, Right  Ankle Jerk Absent. Left Bicep2+ , Right Bicep2+ , Left  Brachioradialis2+ , Right Brachioradialis2+ , Left Triceps2+ ,  Right Triceps2+ , Left Knee Jerk2+ , Right Knee Jerk2+ , Left  Ankle JerkAbsent , Right Ankle JerkAbsent. Gait  : WNL. :WNL.  Hoffman's Sign  : Negative. :Negative. Babinski  : Negative.  :Negative. Ankle Clonus  : Negative. :Negative.  DIAGNOSTIC STUDIES:  RADIOLOGY  I reviewed cervical and lumbar  ap/lateral/flexion/extension xrays completed 11/28/18 at [facility].Marland Kitchen  MRI  Uploaded to PACS. I reviewed MRI lumbar spine performed on  07/26/18 .  Marland Kitchen  ASSESSMENTS  .  Scoliosis - M41.9 (Primary)  .  Spinal stenosis, cervical region - M48.02  .  [name] has multilevel degenerative changes in her spine and I  believe this is the major source of her pain with the underlying  psoriatic arthritis. She has mild scoliosis and prior surgery and  history of SCS which all contributes. I explained that the  scoliosis is not severe enough to consider scoliosis deformity  correction surgery. This would be a huge undertaking. The risk  greatly outweighs the benefits. Her cervical spine MRI  demonstrates mild to moderate C7-T1 stenosis but I detect no  myelopathy. I believe that her diffuse primary pain is related to  her arthritis. I would like her to see her rheumatologist and  consider medications for her psoriatic arthritis, I do not  recommend surgery at this time.  .  FOLLOW UP  .  prn  .  Electronically signed by Toni Butler , MD on  11/28/2018 at 02:01 PM EST  .  Document electronically signed by Toni Butler, RON I   .

## 2018-11-28 NOTE — Progress Notes (Signed)
 * * *      Toni Butler**    ------    68 Y old Female, DOB: 11-22-1951, External MRN: [id number]    Account Number: [id number]    [address] [place], [id number]    Home: [phone]    Insurance: MEDICARE Payer ID: PAPER    PCP: Rosezella [place] Referring: Radene Gunning External Visit ID: [id number]    Appointment Facility: Neurosurgery        * * *    11/28/2018 Progress Notes: Corinna Capra, MD **CHN#:** [id number]    ------    ---       **Current Medications**    ---    Taking    * Betamethasone Valerate 0.12 % Foam APPLY TOPICALLY BID PRN. 2 WEEKS ON AND 1 WEEK OFF. REPEAT PRF FLARES External     ---    * Desvenlafaxine Succinate ER 100 MG Tablet Extended Release 24 Hour TK 1 T PO QAM Oral     ---    * Ferrous Sulfate 325 (65 Fe) MG Tablet 1 tablet Orally Once a day    ---    * Fluocinolone Acetonide Scalp 0.01 % Oil APPLY TO SCALP AND ALLOW TO SIT OVER NIGHT D External     ---    * Lisinopril-Hydrochlorothiazide 10-12.5 MG Tablet TK 1 T PO D Oral     ---    * Nystatin 100000 UNIT/GM Powder 1 application Externally Twice a day    ---    * Oxycodone-Acetaminophen 5-325 MG Tablet (Schedule II Drug) Oral     ---    * Temazepam 30 MG Capsule (Schedule IV Drug) TK 1 C PO HS PRF INSOMNIA Oral     ---    * Triamcinolone Acetonide 0.1 % Lotion APPLY TOPICALLY BID PRN External     ---    * Tumeric Root Extract , Notes: "tumeric with black pepper" 500 mg BID    ---    * Vitamin D (Ergocalciferol) 1.25 MG (50000 UT) Capsule TK 1 C PO 1 TIME A WK Oral     ---    * Medication List reviewed and reconciled with the patient    ---     Past Medical History    ---      Psoriasis.        ---    Psoriatic arthritis.        ---    Hypertension.        ---    Anemia.        ---    Depression.        ---      **Surgical History**    ---      C6-7 ACDF (anterior cervical diskectomy and fusion) (Dr. Loleta Books at  [facility]) 2009?    ---    left total hip replacement 12/2017    ---    right total hip  replacement 05/2018    ---    L2-5 laminectomies and laminoforaminotomies (Dr. Janice Norrie at [facility]) 12/2016    ---    bilateral foot fusions    ---    spinal cord stimulator (removed 2018)    ---    gastric bypass surgery 1998    ---    cholecystectomy    ---    hysterectomy    ---    appendectomy    ---     **Social History**    ---  Tobacco history: Never smoked.     **Allergies**    ---      Compazine: anaphylaxis - Allergy    ---    PredniSONE: Other - Side Effects    ---    [Allergies Verified]      **Hospitalization/Major Diagnostic Procedure**    ---      see surgeries    ---     **Review of Systems**    ---     _GENERAL_ :    Negative for: weight loss within the past year,, weight gain within the past  year,, chest pain,, irregular heart beats,, heart murmurs,, nausea /  vomiting,, easy bruising,, fever / chills,, frequent nose bleeds,, hoarse  voice,, frequent headaches,, shortness of breath,, breast discharge,, kidney  disease,, liver disease,, previous anesthesiea problems,, excess bleeding  during or after prior procedure,.    _NEUROLOGY_ :    Positive for: trouble with balance and coordination,, back pain, weakness.          **Reason for Appointment**    ---      1\. Neck pain and bilateral arm pain (right worse than left)    ---    2\. Low back pain and bilateral leg pain (right worse than the left)    ---      **History of Present Illness**    ---     _NEUROSURGERY_ :    68 year old female presents in initial neurosurgical consultation with low  back and bilateral leg pain (right worse than left). She has numbness in her  feet as well. Her legs and feet feel like "pins and needles". She has history  of L2-5 laminectomies and laminoforaminotomies in February 2018 by Dr.  Janice Norrie. She has history of spinal cord stimulator placement which has since  been removed two years ago. She also complains of neck pain and bilateral arm  pain (right worse than left). She has been going to  physical therapy for the  past year. She has undergone multiple steroid injections years ago in the neck  and lower back. She thinks that she has gait problems. She feels "wobbly" when  going downstairs. She denies bowel or bladder dysfunction. She has a  rheumatologist. She has psoriatic arthritis. She is not on any biologics  (humira, methotrexate, etc.).      **Vital Signs**    ---    Pain scale 9, Ht-in 64, Wt-lbs 193, BMI 33.12.      **Physical Examination**    ---     _NEUROSURGERY_ :    MOTOR Upper Extremities : 5/5 strength in the bilateral upper extremities .  MOTOR Lower Extremities : 5/5 strength in the bilateral lower extremities.  Reflexes Left Bicep 2+, Right Bicep 2+, Left Brachioradialis 2+, Right  Brachioradialis 2+, Left Triceps 2+, Right Triceps 2+, Left Knee Jerk 2+,  Right Knee Jerk 2+, Left Ankle Jerk Absent, Right Ankle Jerk Absent. Gait :  WNL. Hoffman's Sign : Negative. Babinski : Negative. Ankle Clonus : Negative.    _DIAGNOSTIC STUDIES_ :    RADIOLOGY I reviewed cervical and lumbar ap/lateral/flexion/extension xrays  completed 11/28/18 at [facility].Marland Kitchen MRI Uploaded to PACS. I reviewed MRI lumbar spine  performed on 07/26/18 .         **Assessments**    ---    1\. Scoliosis - M41.9 (Primary)    ---    2\. Spinal stenosis, cervical region - M48.02    ---     Ms. Bethel has multilevel degenerative  changes in her spine and I believe  this is the major source of her pain with the underlying psoriatic arthritis.  She has mild scoliosis and prior surgery and history of SCS which all  contributes. I explained that the scoliosis is not severe enough to consider  scoliosis deformity correction surgery. This would be a huge undertaking. The  risk greatly outweighs the benefits. Her cervical spine MRI demonstrates mild  to moderate C7-T1 stenosis but I detect no myelopathy. I believe that her  diffuse primary pain is related to her arthritis. I would like her to see her  rheumatologist and consider  medications for her psoriatic arthritis, I do not  recommend surgery at this time.    ---      **Follow Up**    ---    prn    Electronically signed by Corinna Capra , MD on 11/28/2018 at 02:01 PM EST    Sign off status: Completed        * * *        Neurosurgery    [address] [place]    [place], [place] [address]    Tel: [phone]    Fax: [phone]              * * *         Patient: DAREN, DOSWELL DOB: Aug 08, 1951 Progress Note: Corinna Capra, MD  11/28/2018    ---    Note generated by eClinicalWorks EMR/PM Software ([email or web address])

## 2019-01-24 LAB — UNMAPPED LAB RESULTS
BUN (EXT): 17 mg/dL (ref 8–18)
CalciumCalcium (EXT): 9.5 mg/dL (ref 8.5–10.5)

## 2019-04-23 DIAGNOSIS — E611 Iron deficiency: Secondary | ICD-10-CM | POA: Insufficient documentation

## 2019-04-23 DIAGNOSIS — R269 Unspecified abnormalities of gait and mobility: Secondary | ICD-10-CM | POA: Insufficient documentation

## 2019-04-23 DIAGNOSIS — M839 Adult osteomalacia, unspecified: Secondary | ICD-10-CM | POA: Insufficient documentation

## 2019-04-23 HISTORY — DX: Iron deficiency: E61.1

## 2019-04-23 HISTORY — DX: Unspecified abnormalities of gait and mobility: R26.9

## 2019-04-23 HISTORY — DX: Adult osteomalacia, unspecified: M83.9

## 2019-06-14 ENCOUNTER — Ambulatory Visit: Admitting: Neurological Surgery

## 2019-06-14 LAB — HX BASIC METABOLIC PANEL
CASE NUMBER: 2020200001112
HX ANION GAP: 6 — NL (ref 3.0–11.0)
HX BUN: 12 mg/dL — NL (ref 8.0–23.0)
HX CALCIUM LVL: 9.1 mg/dL — NL (ref 8.5–10.5)
HX CHLORIDE: 102 mmol/L — NL (ref 98.0–110.0)
HX CO2: 30 mmol/L — NL (ref 21.0–32.0)
HX CREATININE: 0.71 mg/dL — NL (ref 0.55–1.3)
HX GLUCOSE LVL: 110 mg/dL — NL (ref 70.0–110.0)
HX POTASSIUM LVL: 4.1 mmol/L — NL (ref 3.6–5.2)
HX SODIUM LVL: 138 mmol/L — NL (ref 136.0–146.0)

## 2019-06-14 LAB — HX GLOMERULAR FILTRATION RATE (ESTIMATED)
CASE NUMBER: 2020200001112
HX AFN AMER GLOMERULAR FILTRATION RATE: >90
HX NON-AFN AMER GLOMERULAR FILTRATION RATE: 87 mL/min/{1.73_m2}

## 2019-07-25 LAB — LIPID PROFILE (EXT)
Chol/HDL Ratio (EXT): 2.9 ratio
Cholesterol (EXT): 196 mg/dL
HDL Cholesterol (EXT): 67 mg/dL
LDL Cholesterol, CALC (EXT): 112 mg/dL
Triglycerides (EXT): 85 mg/dL

## 2019-07-25 LAB — HEMOGLOBIN A1C
Estimated Average Glucose mg/dL (INT/EXT): 125 mg/dL
HEMOGLOBIN A1C % (INT/EXT): 6 % — ABNORMAL HIGH (ref 4.3–5.6)

## 2019-11-19 DIAGNOSIS — M7061 Trochanteric bursitis, right hip: Secondary | ICD-10-CM

## 2019-11-19 DIAGNOSIS — M7062 Trochanteric bursitis, left hip: Secondary | ICD-10-CM | POA: Insufficient documentation

## 2019-11-19 HISTORY — DX: Trochanteric bursitis, left hip: M70.61

## 2019-11-28 LAB — HEMOGLOBIN A1C
Estimated Average Glucose mg/dL (INT/EXT): 128 mg/dL
HEMOGLOBIN A1C % (INT/EXT): 6.1 % — ABNORMAL HIGH (ref 4.3–5.6)

## 2019-11-28 LAB — LIPID PROFILE (EXT)
Chol/HDL Ratio (EXT): 2.6 ratio
Cholesterol (EXT): 216 mg/dL
HDL Cholesterol (EXT): 84 mg/dL
LDL Cholesterol, CALC (EXT): 114 mg/dL
Triglycerides (EXT): 90 mg/dL

## 2020-02-04 LAB — HEMOGLOBIN A1C
Estimated Average Glucose mg/dL (INT/EXT): 127 mg/dL
HEMOGLOBIN A1C % (INT/EXT): 6 % — ABNORMAL HIGH (ref 4.3–5.6)

## 2020-02-04 LAB — LIPID PROFILE (EXT)
Chol/HDL Ratio (EXT): 2.4 ratio
Cholesterol (EXT): 204 mg/dL
HDL Cholesterol (EXT): 84 mg/dL
LDL Cholesterol, CALC (EXT): 108 mg/dL
Triglycerides (EXT): 62 mg/dL

## 2020-11-12 DIAGNOSIS — M489 Spondylopathy, unspecified: Secondary | ICD-10-CM | POA: Insufficient documentation

## 2020-11-12 DIAGNOSIS — M5126 Other intervertebral disc displacement, lumbar region: Secondary | ICD-10-CM

## 2020-11-12 DIAGNOSIS — R209 Unspecified disturbances of skin sensation: Secondary | ICD-10-CM | POA: Insufficient documentation

## 2020-11-12 DIAGNOSIS — M4302 Spondylolysis, cervical region: Secondary | ICD-10-CM | POA: Insufficient documentation

## 2020-11-12 HISTORY — DX: Unspecified disturbances of skin sensation: R20.9

## 2020-11-12 HISTORY — DX: Spondylopathy, unspecified: M48.9

## 2020-11-12 HISTORY — DX: Other intervertebral disc displacement, lumbar region: M51.26

## 2020-11-12 HISTORY — DX: Spondylolysis, cervical region: M43.02

## 2020-11-17 LAB — LIPID PROFILE (EXT)
Chol/HDL Ratio (EXT): 2.4 ratio
Cholesterol (EXT): 205 mg/dL
HDL Cholesterol (EXT): 86 mg/dL
LDL Cholesterol, CALC (EXT): 104 mg/dL
Triglycerides (EXT): 74 mg/dL

## 2021-04-06 DIAGNOSIS — R339 Retention of urine, unspecified: Secondary | ICD-10-CM

## 2021-04-06 DIAGNOSIS — K589 Irritable bowel syndrome without diarrhea: Secondary | ICD-10-CM | POA: Insufficient documentation

## 2021-04-06 HISTORY — DX: Retention of urine, unspecified: R33.9

## 2021-04-06 HISTORY — DX: Irritable bowel syndrome, unspecified: K58.9

## 2021-04-22 LAB — LIPID PROFILE (EXT)
Chol/HDL Ratio (EXT): 2.3 ratio
Cholesterol (EXT): 188 mg/dL
HDL Cholesterol (EXT): 83 mg/dL
LDL Cholesterol, CALC (EXT): 92 mg/dL
Triglycerides (EXT): 67 mg/dL

## 2021-04-26 LAB — HM MAMMOGRAPHY: HM Mammogram: NORMAL (ref 0–4)

## 2021-06-18 ENCOUNTER — Other Ambulatory Visit (HOSPITAL_BASED_OUTPATIENT_CLINIC_OR_DEPARTMENT_OTHER): Admitting: Internal Medicine

## 2021-06-18 ENCOUNTER — Encounter

## 2021-07-29 ENCOUNTER — Ambulatory Visit (INDEPENDENT_AMBULATORY_CARE_PROVIDER_SITE_OTHER): Payer: Medicare Other | Admitting: Medical-Surgical

## 2021-07-29 ENCOUNTER — Encounter: Payer: Self-pay | Admitting: Medical-Surgical

## 2021-07-29 ENCOUNTER — Other Ambulatory Visit: Payer: Self-pay

## 2021-07-29 VITALS — BP 116/66 | HR 64 | Ht 64.0 in | Wt 162.0 lb

## 2021-07-29 DIAGNOSIS — Z780 Asymptomatic menopausal state: Secondary | ICD-10-CM

## 2021-07-29 DIAGNOSIS — E538 Deficiency of other specified B group vitamins: Secondary | ICD-10-CM

## 2021-07-29 DIAGNOSIS — Z23 Encounter for immunization: Secondary | ICD-10-CM

## 2021-07-29 DIAGNOSIS — Z7689 Persons encountering health services in other specified circumstances: Secondary | ICD-10-CM | POA: Diagnosis not present

## 2021-07-29 DIAGNOSIS — R5382 Chronic fatigue, unspecified: Secondary | ICD-10-CM

## 2021-07-29 DIAGNOSIS — G894 Chronic pain syndrome: Secondary | ICD-10-CM | POA: Insufficient documentation

## 2021-07-29 DIAGNOSIS — Z13820 Encounter for screening for osteoporosis: Secondary | ICD-10-CM | POA: Insufficient documentation

## 2021-07-29 DIAGNOSIS — Z0000 Encounter for general adult medical examination without abnormal findings: Secondary | ICD-10-CM

## 2021-07-29 DIAGNOSIS — E559 Vitamin D deficiency, unspecified: Secondary | ICD-10-CM | POA: Insufficient documentation

## 2021-07-29 DIAGNOSIS — E039 Hypothyroidism, unspecified: Secondary | ICD-10-CM

## 2021-07-29 DIAGNOSIS — Z95828 Presence of other vascular implants and grafts: Secondary | ICD-10-CM

## 2021-07-29 DIAGNOSIS — E612 Magnesium deficiency: Secondary | ICD-10-CM

## 2021-07-29 DIAGNOSIS — Z1231 Encounter for screening mammogram for malignant neoplasm of breast: Secondary | ICD-10-CM

## 2021-07-29 HISTORY — DX: Presence of other vascular implants and grafts: Z95.828

## 2021-07-29 HISTORY — DX: Asymptomatic menopausal state: Z78.0

## 2021-07-29 HISTORY — DX: Hypothyroidism, unspecified: E03.9

## 2021-07-29 HISTORY — DX: Chronic fatigue, unspecified: R53.82

## 2021-07-29 HISTORY — DX: Deficiency of other specified B group vitamins: E53.8

## 2021-07-29 MED ORDER — CYANOCOBALAMIN 1000 MCG/ML IJ SOLN
1000.0000 ug | Freq: Once | INTRAMUSCULAR | Status: AC
Start: 2021-07-29 — End: 2021-07-29
  Administered 2021-07-29: 1000 ug via INTRAMUSCULAR

## 2021-07-29 MED ORDER — SHINGRIX 50 MCG/0.5ML IM SUSR: 0.5000 mL | Freq: Once | INTRAMUSCULAR | 0 refills | Status: AC

## 2021-07-29 MED ORDER — DESVENLAFAXINE SUCCINATE ER 100 MG PO TB24: 100.0000 mg | ORAL_TABLET | Freq: Every day | ORAL | 1 refills | Status: DC

## 2021-07-29 NOTE — Progress Notes (Signed)
New Patient Office Visit  Subjective:  Patient ID: Heather Thompson, female    DOB: 05/06/1951  Age: 70 y.o. MRN: [id number]  CC:  Chief Complaint  Patient presents with   Establish Care    HPI Heather Thompson presents to establish care.  She is a very pleasant 70-year-old female who has quite a complicated medical history.  She is originally from the [place] area where she lived with her family however she has had a lot of moving in the last year.  She moved from the [place] area to [place].  Shortly after this, her husband of nearly 50 years passed away.  After that she moved back to [place] with her children and has now relocated to [place].  She has a sister nearby who she is close with and she does continue to have her frequent contact with her children and grandchildren.  Notes this is her first time living alone as an adult which has been quite a struggle to adapt to.    She does have a history of anxiety and depression with mood swings as well as difficulty sleeping.  She was previously seen by mental health specialist and is currently being treated with Pristiq 100 mcg daily.    She also takes naltrexone daily at a 3.5 mg dose that was prescribed by her mental health professional to help combat chronic pain.  She has been found to have a muscle disorder as well as fibromyalgia.  She also has multiple orthopedic issues including osteoarthritis, neck/fine fusions, bilateral hip replacements, and bilateral fusions to her feet.  She takes gabapentin to help with nerve pain but this is not significantly effective on its own.  She is also treated with meloxicam 7.5 mg twice daily.    She does have a history of vitamin B12 deficiency and takes daily oral vitamin B12 supplementation but gets monthly B12 injections.  She is overdue for her B12 injection and would like to have that done today.  Of most importance, she is in dire need of connection with an infusion center.  She has a right  chest Port-A-Cath that has been in place for at least 10 years.  She gets monthly flushes of her Port-A-Cath and has labs drawn when appropriate.  Unfortunately with her relocation, she was unable to be connected with the resources to have this done so she is overdue for her monthly flush.  Her last flush was in July and she is a bit worried about possible complications with the Port-A-Cath because of this.  Past Medical History:  Diagnosis Date   Allergy    Anemia    Clotting disorder (HCC)    Depression    Fibromyalgia    Hypertension    Hypothyroidism    Muscle disorder    Salzmann nodular degeneration     Past Surgical History:  Procedure Laterality Date   ABDOMINAL HYSTERECTOMY     APPENDECTOMY     CHOLECYSTECTOMY     JOINT REPLACEMENT      Family History  Problem Relation Age of Onset   Hypertension Mother    Heart attack Mother    Lung cancer Father    Prostate cancer Father    Kidney cancer Brother     Social History   Socioeconomic History   Marital status: Widowed    Spouse name: Not on file   Number of children: Not on file   Years of education: Not on file   Highest education   New Patient Office Visit  Subjective:  Patient ID: Heather Thompson, female    DOB: 05/06/1951  Age: 70 y.o. MRN: [id number]  CC:  Chief Complaint  Patient presents with   Establish Care    HPI Heather Thompson presents to establish care.  She is a very pleasant 70-year-old female who has quite a complicated medical history.  She is originally from the [place] area where she lived with her family however she has had a lot of moving in the last year.  She moved from the [place] area to [place].  Shortly after this, her husband of nearly 50 years passed away.  After that she moved back to [place] with her children and has now relocated to [place].  She has a sister nearby who she is close with and she does continue to have her frequent contact with her children and grandchildren.  Notes this is her first time living alone as an adult which has been quite a struggle to adapt to.    She does have a history of anxiety and depression with mood swings as well as difficulty sleeping.  She was previously seen by mental health specialist and is currently being treated with Pristiq 100 mcg daily.    She also takes naltrexone daily at a 3.5 mg dose that was prescribed by her mental health professional to help combat chronic pain.  She has been found to have a muscle disorder as well as fibromyalgia.  She also has multiple orthopedic issues including osteoarthritis, neck/fine fusions, bilateral hip replacements, and bilateral fusions to her feet.  She takes gabapentin to help with nerve pain but this is not significantly effective on its own.  She is also treated with meloxicam 7.5 mg twice daily.    She does have a history of vitamin B12 deficiency and takes daily oral vitamin B12 supplementation but gets monthly B12 injections.  She is overdue for her B12 injection and would like to have that done today.  Of most importance, she is in dire need of connection with an infusion center.  She has a right  chest Port-A-Cath that has been in place for at least 10 years.  She gets monthly flushes of her Port-A-Cath and has labs drawn when appropriate.  Unfortunately with her relocation, she was unable to be connected with the resources to have this done so she is overdue for her monthly flush.  Her last flush was in July and she is a bit worried about possible complications with the Port-A-Cath because of this.  Past Medical History:  Diagnosis Date   Allergy    Anemia    Clotting disorder (HCC)    Depression    Fibromyalgia    Hypertension    Hypothyroidism    Muscle disorder    Salzmann nodular degeneration     Past Surgical History:  Procedure Laterality Date   ABDOMINAL HYSTERECTOMY     APPENDECTOMY     CHOLECYSTECTOMY     JOINT REPLACEMENT      Family History  Problem Relation Age of Onset   Hypertension Mother    Heart attack Mother    Lung cancer Father    Prostate cancer Father    Kidney cancer Brother     Social History   Socioeconomic History   Marital status: Widowed    Spouse name: Not on file   Number of children: Not on file   Years of education: Not on file   Highest education   New Patient Office Visit  Subjective:  Patient ID: Heather Thompson, female    DOB: 11/18/1951  Age: 70 y.o. MRN: [id number]  CC:  Chief Complaint  Patient presents with   Establish Care    HPI Heather Thompson presents to establish care.  She is a very pleasant 70-year-old female who has quite a complicated medical history.  She is originally from the [place] area where she lived with her family however she has had a lot of moving in the last year.  She moved from the [place] area to [place].  Shortly after this, her husband of nearly 50 years passed away.  After that she moved back to [place] with her children and has now relocated to [place].  She has a sister nearby who she is close with and she does continue to have her frequent contact with her children and grandchildren.  Notes this is her first time living alone as an adult which has been quite a struggle to adapt to.    She does have a history of anxiety and depression with mood swings as well as difficulty sleeping.  She was previously seen by mental health specialist and is currently being treated with Pristiq 100 mcg daily.    She also takes naltrexone daily at a 3.5 mg dose that was prescribed by her mental health professional to help combat chronic pain.  She has been found to have a muscle disorder as well as fibromyalgia.  She also has multiple orthopedic issues including osteoarthritis, neck/fine fusions, bilateral hip replacements, and bilateral fusions to her feet.  She takes gabapentin to help with nerve pain but this is not significantly effective on its own.  She is also treated with meloxicam 7.5 mg twice daily.    She does have a history of vitamin B12 deficiency and takes daily oral vitamin B12 supplementation but gets monthly B12 injections.  She is overdue for her B12 injection and would like to have that done today.  Of most importance, she is in dire need of connection with an infusion center.  She has a right  chest Port-A-Cath that has been in place for at least 10 years.  She gets monthly flushes of her Port-A-Cath and has labs drawn when appropriate.  Unfortunately with her relocation, she was unable to be connected with the resources to have this done so she is overdue for her monthly flush.  Her last flush was in July and she is a bit worried about possible complications with the Port-A-Cath because of this.  Past Medical History:  Diagnosis Date   Allergy    Anemia    Clotting disorder (HCC)    Depression    Fibromyalgia    Hypertension    Hypothyroidism    Muscle disorder    Salzmann nodular degeneration     Past Surgical History:  Procedure Laterality Date   ABDOMINAL HYSTERECTOMY     APPENDECTOMY     CHOLECYSTECTOMY     JOINT REPLACEMENT      Family History  Problem Relation Age of Onset   Hypertension Mother    Heart attack Mother    Lung cancer Father    Prostate cancer Father    Kidney cancer Brother     Social History   Socioeconomic History   Marital status: Widowed    Spouse name: Not on file   Number of children: Not on file   Years of education: Not on file   Highest education

## 2021-08-02 ENCOUNTER — Other Ambulatory Visit: Payer: Self-pay

## 2021-08-02 ENCOUNTER — Telehealth: Payer: Self-pay | Admitting: *Deleted

## 2021-08-02 ENCOUNTER — Other Ambulatory Visit: Payer: Self-pay | Admitting: Family

## 2021-08-02 DIAGNOSIS — D509 Iron deficiency anemia, unspecified: Secondary | ICD-10-CM

## 2021-08-02 DIAGNOSIS — D519 Vitamin B12 deficiency anemia, unspecified: Secondary | ICD-10-CM

## 2021-08-02 DIAGNOSIS — E559 Vitamin D deficiency, unspecified: Secondary | ICD-10-CM

## 2021-08-02 DIAGNOSIS — D649 Anemia, unspecified: Secondary | ICD-10-CM

## 2021-08-02 MED ORDER — VITAMIN D (ERGOCALCIFEROL) 1.25 MG (50000 UNIT) PO CAPS: 50000.0000 [IU] | ORAL_CAPSULE | ORAL | 0 refills | Status: DC

## 2021-08-02 NOTE — Telephone Encounter (Signed)
Per referral Dr. Larinda Buttery - called and gave upcoming appointments - confirmed

## 2021-08-03 ENCOUNTER — Inpatient Hospital Stay: Payer: Medicare Other | Attending: Hematology & Oncology

## 2021-08-03 ENCOUNTER — Inpatient Hospital Stay: Payer: Medicare Other

## 2021-08-03 ENCOUNTER — Other Ambulatory Visit: Payer: Self-pay

## 2021-08-03 VITALS — BP 145/49 | HR 60 | Temp 98.0°F | Resp 18

## 2021-08-03 DIAGNOSIS — Z452 Encounter for adjustment and management of vascular access device: Secondary | ICD-10-CM | POA: Insufficient documentation

## 2021-08-03 DIAGNOSIS — D509 Iron deficiency anemia, unspecified: Secondary | ICD-10-CM

## 2021-08-03 DIAGNOSIS — D519 Vitamin B12 deficiency anemia, unspecified: Secondary | ICD-10-CM

## 2021-08-03 DIAGNOSIS — D649 Anemia, unspecified: Secondary | ICD-10-CM

## 2021-08-03 LAB — CBC WITH DIFFERENTIAL (CANCER CENTER ONLY)
Abs Immature Granulocytes: 0.05 10*3/uL (ref 0.00–0.07)
Basophils Absolute: 0 10*3/uL (ref 0.0–0.1)
Basophils Relative: 1 %
Eosinophils Absolute: 0.2 10*3/uL (ref 0.0–0.5)
Eosinophils Relative: 5 %
HCT: 34.4 % — ABNORMAL LOW (ref 36.0–46.0)
Hemoglobin: 11.3 g/dL — ABNORMAL LOW (ref 12.0–15.0)
Immature Granulocytes: 1 %
Lymphocytes Relative: 33 %
Lymphs Abs: 1.2 10*3/uL (ref 0.7–4.0)
MCH: 30.5 pg (ref 26.0–34.0)
MCHC: 32.8 g/dL (ref 30.0–36.0)
MCV: 92.7 fL (ref 80.0–100.0)
Monocytes Absolute: 0.3 10*3/uL (ref 0.1–1.0)
Monocytes Relative: 7 %
Neutro Abs: 2 10*3/uL (ref 1.7–7.7)
Neutrophils Relative %: 53 %
Platelet Count: 176 10*3/uL (ref 150–400)
RBC: 3.71 MIL/uL — ABNORMAL LOW (ref 3.87–5.11)
RDW: 11.8 % (ref 11.5–15.5)
WBC Count: 3.8 10*3/uL — ABNORMAL LOW (ref 4.0–10.5)
nRBC: 0 % (ref 0.0–0.2)

## 2021-08-03 LAB — CMP (CANCER CENTER ONLY)
ALT: 23 U/L (ref 0–44)
AST: 26 U/L (ref 15–41)
Albumin: 3.8 g/dL (ref 3.5–5.0)
Alkaline Phosphatase: 73 U/L (ref 38–126)
Anion gap: 6 (ref 5–15)
BUN: 12 mg/dL (ref 8–23)
CO2: 32 mmol/L (ref 22–32)
Calcium: 9.1 mg/dL (ref 8.9–10.3)
Chloride: 97 mmol/L — ABNORMAL LOW (ref 98–111)
Creatinine: 0.7 mg/dL (ref 0.44–1.00)
GFR, Estimated: >60 mL/min (ref >60)
Glucose, Bld: 146 mg/dL — ABNORMAL HIGH (ref 70–99)
Potassium: 3.5 mmol/L (ref 3.5–5.1)
Sodium: 135 mmol/L (ref 135–145)
Total Bilirubin: 0.6 mg/dL (ref 0.3–1.2)
Total Protein: 6.2 g/dL — ABNORMAL LOW (ref 6.5–8.1)

## 2021-08-03 LAB — IRON AND TIBC
Iron: 87 ug/dL (ref 41–142)
Saturation Ratios: 26 % (ref 21–57)
TIBC: 338 ug/dL (ref 236–444)
UIBC: 250 ug/dL (ref 120–384)

## 2021-08-03 LAB — RETICULOCYTES
Immature Retic Fract: 6.7 % (ref 2.3–15.9)
RBC.: 3.67 MIL/uL — ABNORMAL LOW (ref 3.87–5.11)
Retic Count, Absolute: 44.8 K/uL (ref 19.0–186.0)
Retic Ct Pct: 1.2 % (ref 0.4–3.1)

## 2021-08-03 LAB — VITAMIN B12: Vitamin B-12: 3292 pg/mL — ABNORMAL HIGH (ref 180–914)

## 2021-08-03 LAB — FOLATE: Folate: 83.8 ng/mL

## 2021-08-03 LAB — FERRITIN: Ferritin: 182 ng/mL (ref 11–307)

## 2021-08-03 LAB — LACTATE DEHYDROGENASE: LDH: 148 U/L (ref 98–192)

## 2021-08-03 LAB — SAVE SMEAR(SSMR), FOR PROVIDER SLIDE REVIEW

## 2021-08-03 MED ORDER — SODIUM CHLORIDE 0.9% FLUSH
10.0000 mL | Freq: Once | INTRAVENOUS | Status: AC
  Administered 2021-08-03: 10 mL via INTRAVENOUS

## 2021-08-03 MED ORDER — HEPARIN SOD (PORK) LOCK FLUSH 100 UNIT/ML IV SOLN
500.0000 [IU] | Freq: Once | INTRAVENOUS | Status: AC
  Administered 2021-08-03: 500 [IU] via INTRAVENOUS

## 2021-08-03 NOTE — Patient Instructions (Signed)
Implanted Port Home Guide An implanted port is a device that is placed under the skin. It is usually placed in the chest. The device can be used to give IV medicine, to take blood, or for dialysis. You may have an implanted port if: You need IV medicine that would be irritating to the small veins in your hands or arms. You need IV medicines, such as antibiotics, for a long period of time. You need IV nutrition for a long period of time. You need dialysis. When you have a port, your health care provider can choose to use the port instead of veins in your arms for these procedures. You may have fewer limitations when using a port than you would if you used other types of long-term IVs, and you will likely be able to return to normal activities after your incision heals. An implanted port has two main parts: Reservoir. The reservoir is the part where a needle is inserted to give medicines or draw blood. The reservoir is round. After it is placed, it appears as a small, raised area under your skin. Catheter. The catheter is a thin, flexible tube that connects the reservoir to a vein. Medicine that is inserted into the reservoir goes into the catheter and then into the vein. How is my port accessed? To access your port: A numbing cream may be placed on the skin over the port site. Your health care provider will put on a mask and sterile gloves. The skin over your port will be cleaned carefully with a germ-killing soap and allowed to dry. Your health care provider will gently pinch the port and insert a needle into it. Your health care provider will check for a blood return to make sure the port is in the vein and is not clogged. If your port needs to remain accessed to get medicine continuously (constant infusion), your health care provider will place a clear bandage (dressing) over the needle site. The dressing and needle will need to be changed every week, or as told by your health care provider. What  is flushing? Flushing helps keep the port from getting clogged. Follow instructions from your health care provider about how and when to flush the port. Ports are usually flushed with saline solution or a medicine called heparin. The need for flushing will depend on how the port is used: If the port is only used from time to time to give medicines or draw blood, the port may need to be flushed: Before and after medicines have been given. Before and after blood has been drawn. As part of routine maintenance. Flushing may be recommended every 4-6 weeks. If a constant infusion is running, the port may not need to be flushed. Throw away any syringes in a disposal container that is meant for sharp items (sharps container). You can buy a sharps container from a pharmacy, or you can make one by using an empty hard plastic bottle with a cover. How long will my port stay implanted? The port can stay in for as long as your health care provider thinks it is needed. When it is time for the port to come out, a surgery will be done to remove it. The surgery will be similar to the procedure that was done to put the port in. Follow these instructions at home:  Flush your port as told by your health care provider. If you need an infusion over several days, follow instructions from your health care provider about how   to take care of your port site. Make sure you: Wash your hands with soap and water before you change your dressing. If soap and water are not available, use alcohol-based hand sanitizer. Change your dressing as told by your health care provider. Place any used dressings or infusion bags into a plastic bag. Throw that bag in the trash. Keep the dressing that covers the needle clean and dry. Do not get it wet. Do not use scissors or sharp objects near the tube. Keep the tube clamped, unless it is being used. Check your port site every day for signs of infection. Check for: Redness, swelling, or  pain. Fluid or blood. Pus or a bad smell. Protect the skin around the port site. Avoid wearing bra straps that rub or irritate the site. Protect the skin around your port from seat belts. Place a soft pad over your chest if needed. Bathe or shower as told by your health care provider. The site may get wet as long as you are not actively receiving an infusion. Return to your normal activities as told by your health care provider. Ask your health care provider what activities are safe for you. Carry a medical alert card or wear a medical alert bracelet at all times. This will let health care providers know that you have an implanted port in case of an emergency. Get help right away if: You have redness, swelling, or pain at the port site. You have fluid or blood coming from your port site. You have pus or a bad smell coming from the port site. You have a fever. Summary Implanted ports are usually placed in the chest for long-term IV access. Follow instructions from your health care provider about flushing the port and changing bandages (dressings). Take care of the area around your port by avoiding clothing that puts pressure on the area, and by watching for signs of infection. Protect the skin around your port from seat belts. Place a soft pad over your chest if needed. Get help right away if you have a fever or you have redness, swelling, pain, drainage, or a bad smell at the port site. This information is not intended to replace advice given to you by your health care provider. Make sure you discuss any questions you have with your health care provider. Document Revised: 02/02/2021 Document Reviewed: 03/29/2020 Elsevier Patient Education  2022 [employer] Inc.  

## 2021-08-04 LAB — ERYTHROPOIETIN: Erythropoietin: 8.4 m[IU]/mL (ref 2.6–18.5)

## 2021-08-05 ENCOUNTER — Telehealth: Payer: Self-pay | Admitting: *Deleted

## 2021-08-05 NOTE — Telephone Encounter (Signed)
Patient notified per order of S. [place] NP to "cut back on B12 supplement to every third day." Teach back done.  Pt is appreciative of call and has no questions or concerns at this time.

## 2021-08-05 NOTE — Telephone Encounter (Signed)
-----   Message from Verdie Mosher, NP sent at 08/04/2021  3:46 PM EDT ----- She can cut back on her B 12 supplement to every 3rd day. Thank you!   ----- Message ----- From: Interface, [facility] [place] Sent: 08/03/2021  10:15 AM EDT To: Verdie Mosher, NP

## 2021-08-18 ENCOUNTER — Encounter: Payer: Self-pay | Admitting: Medical-Surgical

## 2021-08-18 ENCOUNTER — Ambulatory Visit (INDEPENDENT_AMBULATORY_CARE_PROVIDER_SITE_OTHER): Payer: Medicare Other | Admitting: Medical-Surgical

## 2021-08-18 ENCOUNTER — Other Ambulatory Visit: Payer: Self-pay

## 2021-08-18 VITALS — BP 167/86 | HR 73 | Resp 20 | Ht 64.0 in | Wt 162.7 lb

## 2021-08-18 DIAGNOSIS — F322 Major depressive disorder, single episode, severe without psychotic features: Secondary | ICD-10-CM | POA: Diagnosis not present

## 2021-08-18 MED ORDER — BUPROPION HCL ER (XL) 150 MG PO TB24: 150.0000 mg | ORAL_TABLET | ORAL | 0 refills | Status: DC
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
## 2021-08-22 ENCOUNTER — Ambulatory Visit (HOSPITAL_BASED_OUTPATIENT_CLINIC_OR_DEPARTMENT_OTHER)

## 2021-08-22 DIAGNOSIS — Z1231 Encounter for screening mammogram for malignant neoplasm of breast: Secondary | ICD-10-CM

## 2021-08-29 ENCOUNTER — Other Ambulatory Visit: Payer: Self-pay

## 2021-08-29 ENCOUNTER — Ambulatory Visit (INDEPENDENT_AMBULATORY_CARE_PROVIDER_SITE_OTHER): Payer: Medicare Other | Admitting: Medical-Surgical

## 2021-08-29 VITALS — BP 161/66 | HR 69

## 2021-08-29 DIAGNOSIS — E538 Deficiency of other specified B group vitamins: Secondary | ICD-10-CM | POA: Diagnosis not present

## 2021-08-29 MED ORDER — CYANOCOBALAMIN 1000 MCG/ML IJ SOLN
1000.0000 ug | Freq: Once | INTRAMUSCULAR | Status: AC
  Administered 2021-08-29: 1000 ug via INTRAMUSCULAR

## 2021-08-29 NOTE — Progress Notes (Signed)
Established Patient Office Visit  Subjective:  Patient ID: Heather Thompson, female    DOB: July 31, 1951  Age: 70 y.o. MRN: [id number]  CC:  Chief Complaint  Patient presents with   Pernicious Anemia    HPI Heather Thompson is here for a vitamin B 12 injection. Denies muscle cramps, weakness or irregular heart rate.    Hypertension - She reports taking lisnopril-HCTZ daily. Denies chest pain, shortness of breath or dizziness. She reports home readings around 120/80.  Past Medical History:  Diagnosis Date   Allergy    Anemia    Clotting disorder (HCC)    Depression    Fibromyalgia    Hypertension    Hypothyroidism    Muscle disorder    Salzmann nodular degeneration     Past Surgical History:  Procedure Laterality Date   ABDOMINAL HYSTERECTOMY     APPENDECTOMY     CHOLECYSTECTOMY     JOINT REPLACEMENT      Family History  Problem Relation Age of Onset   Hypertension Mother    Heart attack Mother    Lung cancer Father    Prostate cancer Father    Kidney cancer Brother     Social History   Socioeconomic History   Marital status: Widowed    Spouse name: Not on file   Number of children: Not on file   Years of education: Not on file   Highest education level: Not on file  Occupational History   Not on file  Tobacco Use   Smoking status: Never   Smokeless tobacco: Never  Vaping Use   Vaping Use: Never used  Substance and Sexual Activity   Alcohol use: Yes    Comment: rarely   Drug use: Never   Sexual activity: Not Currently  Other Topics Concern   Not on file  Social History Narrative   Not on file   Social Determinants of Health   Financial Resource Strain: Not on file  Food Insecurity: Not on file  Transportation Needs: Not on file  Physical Activity: Not on file  Stress: Not on file  Social Connections: Not on file  Intimate Partner Violence: Not on file    Outpatient Medications Prior to Visit  Medication Sig Dispense Refill   buPROPion  (WELLBUTRIN XL) 150 MG 24 hr tablet Take 1 tablet (150 mg total) by mouth every morning. 90 tablet 0   Cyanocobalamin 1000 MCG/ML KIT Inject as directed.     desvenlafaxine (PRISTIQ) 100 MG 24 hr tablet Take 1 tablet (100 mg total) by mouth daily. 90 tablet 1   gabapentin (NEURONTIN) 100 MG capsule Take 100 mg by mouth. One tablet by mouth once daily at lunchtime     gabapentin (NEURONTIN) 300 MG capsule Take 300 mg by mouth 3 (three) times daily.     lisinopril-hydrochlorothiazide (ZESTORETIC) 10-12.5 MG tablet Take 1 tablet by mouth daily.     Magnesium 250 MG TABS Take 1 tablet by mouth daily.     meloxicam (MOBIC) 7.5 MG tablet Take 1 tablet by mouth 2 (two) times daily.     Multiple Vitamin (MULTI-VITAMIN) tablet Take 1 tablet by mouth daily.     nystatin (MYCOSTATIN/NYSTOP) powder Apply 1 application topically 2 (two) times daily.     propranolol (INDERAL) 20 MG tablet Take 20 mg by mouth daily as needed.     temazepam (RESTORIL) 30 MG capsule Take 30 mg by mouth daily as needed.     Vitamin D, Ergocalciferol, (DRISDOL) 1.25 MG (  50000 UNIT) CAPS capsule Take 1 capsule (50,000 Units total) by mouth once a week. 12 capsule 0   No facility-administered medications prior to visit.    Allergies  Allergen Reactions   Compazine [Prochlorperazine] Anaphylaxis   Phenothiazines Anaphylaxis and Other (See Comments)    Caused her throat to close Other reaction(s): Unknown (comments) Other reaction(s): Unknown (comments)    Other Other (See Comments)    Compazine.  Compazine.     Pregabalin Other (See Comments) and Swelling    Swelling and itching of hands and feet.  Swelling and itching of hands and feet.  Other reaction(s): Unknown (comments)    Prednisone Rash and Other (See Comments)    Other reaction(s): Unknown (comments) Other reaction(s): Unknown (comments)     ROS Review of Systems    Objective:    Physical Exam  BP (!) 154/61   Pulse 69   SpO2 100%  Wt Readings  from Last 3 Encounters:  08/18/21 162 lb 11.2 oz (73.8 kg)  07/29/21 162 lb (73.5 kg)     Health Maintenance Due  Topic Date Due   Hepatitis C Screening  Never done   DEXA SCAN  Never done   TETANUS/TDAP  06/10/2019    There are no preventive care reminders to display for this patient.  No results found for: TSH Lab Results  Component Value Date   WBC 3.8 (L) 08/03/2021   HGB 11.3 (L) 08/03/2021   HCT 34.4 (L) 08/03/2021   MCV 92.7 08/03/2021   PLT 176 08/03/2021   Lab Results  Component Value Date   NA 135 08/03/2021   K 3.5 08/03/2021   CO2 32 08/03/2021   GLUCOSE 146 (H) 08/03/2021   BUN 12 08/03/2021   CREATININE 0.70 08/03/2021   BILITOT 0.6 08/03/2021   ALKPHOS 73 08/03/2021   AST 26 08/03/2021   ALT 23 08/03/2021   PROT 6.2 (L) 08/03/2021   ALBUMIN 3.8 08/03/2021   CALCIUM 9.1 08/03/2021   ANIONGAP 6 08/03/2021   No results found for: CHOL No results found for: HDL No results found for: LDLCALC No results found for: TRIG No results found for: CHOLHDL No results found for: ZOXW9U    Assessment & Plan:  B12 deficiency - Patient tolerated injection well without complications. Patient advised to schedule next injection 30 days from today.    Hypertension - Patient advised to continue current medication. Follow up with Joy on the 20 th of October.    Problem List Items Addressed This Visit     Vitamin B12 deficiency - Primary    Meds ordered this encounter  Medications   cyanocobalamin ((VITAMIN B-12)) injection 1,000 mcg    Follow-up: Return in about 4 weeks (around 09/26/2021) for B12 injection. Earna Coder, Janalyn Harder, CMA

## 2021-08-31 ENCOUNTER — Ambulatory Visit (INDEPENDENT_AMBULATORY_CARE_PROVIDER_SITE_OTHER): Payer: Medicare Other

## 2021-08-31 ENCOUNTER — Other Ambulatory Visit: Payer: Self-pay

## 2021-08-31 DIAGNOSIS — Z780 Asymptomatic menopausal state: Secondary | ICD-10-CM | POA: Diagnosis not present

## 2021-09-06 ENCOUNTER — Other Ambulatory Visit: Payer: Self-pay | Admitting: Family

## 2021-09-06 DIAGNOSIS — D649 Anemia, unspecified: Secondary | ICD-10-CM

## 2021-09-07 ENCOUNTER — Inpatient Hospital Stay: Payer: Medicare Other | Attending: Hematology & Oncology

## 2021-09-07 ENCOUNTER — Inpatient Hospital Stay (HOSPITAL_BASED_OUTPATIENT_CLINIC_OR_DEPARTMENT_OTHER): Payer: Medicare Other | Admitting: Family

## 2021-09-07 ENCOUNTER — Other Ambulatory Visit: Payer: Self-pay

## 2021-09-07 ENCOUNTER — Inpatient Hospital Stay: Payer: Medicare Other

## 2021-09-07 ENCOUNTER — Encounter: Payer: Self-pay | Admitting: Family

## 2021-09-07 VITALS — BP 146/68 | HR 81 | Temp 98.3°F | Resp 18 | Wt 167.1 lb

## 2021-09-07 DIAGNOSIS — M8580 Other specified disorders of bone density and structure, unspecified site: Secondary | ICD-10-CM | POA: Diagnosis not present

## 2021-09-07 DIAGNOSIS — D508 Other iron deficiency anemias: Secondary | ICD-10-CM

## 2021-09-07 DIAGNOSIS — Z8249 Family history of ischemic heart disease and other diseases of the circulatory system: Secondary | ICD-10-CM

## 2021-09-07 DIAGNOSIS — Z801 Family history of malignant neoplasm of trachea, bronchus and lung: Secondary | ICD-10-CM | POA: Diagnosis not present

## 2021-09-07 DIAGNOSIS — D509 Iron deficiency anemia, unspecified: Secondary | ICD-10-CM

## 2021-09-07 DIAGNOSIS — E039 Hypothyroidism, unspecified: Secondary | ICD-10-CM

## 2021-09-07 DIAGNOSIS — K909 Intestinal malabsorption, unspecified: Secondary | ICD-10-CM | POA: Insufficient documentation

## 2021-09-07 DIAGNOSIS — Z79899 Other long term (current) drug therapy: Secondary | ICD-10-CM | POA: Diagnosis not present

## 2021-09-07 DIAGNOSIS — Z9884 Bariatric surgery status: Secondary | ICD-10-CM | POA: Insufficient documentation

## 2021-09-07 DIAGNOSIS — I10 Essential (primary) hypertension: Secondary | ICD-10-CM | POA: Diagnosis not present

## 2021-09-07 DIAGNOSIS — Z8042 Family history of malignant neoplasm of prostate: Secondary | ICD-10-CM | POA: Diagnosis not present

## 2021-09-07 DIAGNOSIS — D649 Anemia, unspecified: Secondary | ICD-10-CM

## 2021-09-07 LAB — CMP (CANCER CENTER ONLY)
ALT: 31 U/L (ref 0–44)
AST: 30 U/L (ref 15–41)
Albumin: 4.1 g/dL (ref 3.5–5.0)
Alkaline Phosphatase: 85 U/L (ref 38–126)
Anion gap: 7 (ref 5–15)
BUN: 15 mg/dL (ref 8–23)
CO2: 30 mmol/L (ref 22–32)
Calcium: 9.5 mg/dL (ref 8.9–10.3)
Chloride: 98 mmol/L (ref 98–111)
Creatinine: 0.7 mg/dL (ref 0.44–1.00)
GFR, Estimated: >60 mL/min (ref >60)
Glucose, Bld: 106 mg/dL — ABNORMAL HIGH (ref 70–99)
Potassium: 3.7 mmol/L (ref 3.5–5.1)
Sodium: 135 mmol/L (ref 135–145)
Total Bilirubin: 0.3 mg/dL (ref 0.3–1.2)
Total Protein: 6.6 g/dL (ref 6.5–8.1)

## 2021-09-07 LAB — CBC WITH DIFFERENTIAL (CANCER CENTER ONLY)
Abs Immature Granulocytes: 0.05 10*3/uL (ref 0.00–0.07)
Basophils Absolute: 0 10*3/uL (ref 0.0–0.1)
Basophils Relative: 1 %
Eosinophils Absolute: 0.2 10*3/uL (ref 0.0–0.5)
Eosinophils Relative: 4 %
HCT: 36.6 % (ref 36.0–46.0)
Hemoglobin: 11.9 g/dL — ABNORMAL LOW (ref 12.0–15.0)
Immature Granulocytes: 1 %
Lymphocytes Relative: 31 %
Lymphs Abs: 1.7 10*3/uL (ref 0.7–4.0)
MCH: 30.2 pg (ref 26.0–34.0)
MCHC: 32.5 g/dL (ref 30.0–36.0)
MCV: 92.9 fL (ref 80.0–100.0)
Monocytes Absolute: 0.4 10*3/uL (ref 0.1–1.0)
Monocytes Relative: 7 %
Neutro Abs: 3 10*3/uL (ref 1.7–7.7)
Neutrophils Relative %: 56 %
Platelet Count: 226 10*3/uL (ref 150–400)
RBC: 3.94 MIL/uL (ref 3.87–5.11)
RDW: 12 % (ref 11.5–15.5)
WBC Count: 5.4 10*3/uL (ref 4.0–10.5)
nRBC: 0 % (ref 0.0–0.2)

## 2021-09-07 LAB — SAVE SMEAR(SSMR), FOR PROVIDER SLIDE REVIEW

## 2021-09-07 LAB — FERRITIN: Ferritin: 160 ng/mL (ref 11–307)

## 2021-09-07 LAB — RETICULOCYTES
Immature Retic Fract: 6.9 % (ref 2.3–15.9)
RBC.: 3.91 MIL/uL (ref 3.87–5.11)
Retic Count, Absolute: 62.2 10*3/uL (ref 19.0–186.0)
Retic Ct Pct: 1.6 % (ref 0.4–3.1)

## 2021-09-07 LAB — IRON AND TIBC
Iron: 76 ug/dL (ref 28–170)
Saturation Ratios: 19 % (ref 10.4–31.8)
TIBC: 403 ug/dL (ref 250–450)
UIBC: 327 ug/dL

## 2021-09-07 MED ORDER — SODIUM CHLORIDE 0.9% FLUSH
10.0000 mL | Freq: Once | INTRAVENOUS | Status: AC
  Administered 2021-09-07: 10 mL via INTRAVENOUS

## 2021-09-07 MED ORDER — HEPARIN SOD (PORK) LOCK FLUSH 100 UNIT/ML IV SOLN
500.0000 [IU] | Freq: Once | INTRAVENOUS | Status: AC
  Administered 2021-09-07: 500 [IU] via INTRAVENOUS

## 2021-09-07 NOTE — Progress Notes (Signed)
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  She has received IV iron in the past (last infusion 4-5 years ago). She is currently on an over the counter oral iron supplement daily.  She received q B 12 injection monthly and recently stopped the concurrent oral B 12 due to elevated level > 3,000.  She takes a vitamin D supplement once a weeks.  She has not noted any blood loss. No abnormal bruising , no petechiae.  No known family history of anemia.  She notes fatigue at times.  She has had some "brain fog" and is concerned that this may be due to her long term use of Neurontin. She plans to discuss with her PCP.  No personal history of cancer. She had 1 brother with metastatic gastric cancer (agent orange) and once brother with metastatic kidney cancer.  No history of diabetes.  She has history of hypothyroidism but states that she has not required medication in many years.  She states that she is up to date on her colonoscopy and that she has never had polyps removed. She states that she is due again in several years.  She had her mammogram in May of this year and states that the result was negative.  Her bone density showed her to have osteopenia.  No fever, chills, n/v, cough, rash, dizziness, SOB, chest pain, palpitations, abdominal pain or changes in bowel or bladder habits.  No numbness or tingling in her extremities at this time.  She has swelling and tenderness in her joints due to arthritis  and fibromyalgia. 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 She states that she needs both knees replaced but is holding off for right now.  No falls or syncope to report.   No smoking, ETOH or recreational drug use.  She has a good appetite and is doing her best to stay well hydrated. Her weight is stable at 167 lbs. She plans to discuss her target BMI and weight goal with her PCP.  She moved to [place] this year to be closer to her sister after her husband passed away.  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  She enjoys walking for exercise.   ROS: All other 10 point review of systems is negative.   PAST MEDICAL HISTORY:   Past Medical History:  Diagnosis Date   Allergy    Anemia    Clotting disorder ([place])    Depression    Fibromyalgia    Hypertension    Hypothyroidism    Muscle disorder    Salzmann nodular degeneration     ALLERGIES: Allergies  Allergen Reactions   Compazine [Prochlorperazine] Anaphylaxis   Phenothiazines Anaphylaxis and Other (See Comments)    Caused her throat to close Other reaction(s): Unknown (comments) Other reaction(s): Unknown (comments)    Other Other (  See Comments)    Compazine.  Compazine.     Pregabalin Other (See Comments) and Swelling    Swelling and itching of hands and feet.  Swelling and itching of hands and feet.  Other reaction(s): Unknown (comments)    Prednisone Rash and Other (See Comments)    Other reaction(s): Unknown (comments) Other reaction(s): Unknown (comments)       MEDICATIONS:  Current Outpatient Medications on File Prior to Visit  Medication Sig Dispense Refill   buPROPion (WELLBUTRIN XL) 150 MG 24 hr tablet Take 1 tablet (150 mg total) by mouth every morning. 90 tablet 0   Cyanocobalamin 1000 MCG/ML KIT Inject as directed.     desvenlafaxine (PRISTIQ) 100 MG  24 hr tablet Take 1 tablet (100 mg total) by mouth daily. 90 tablet 1   gabapentin (NEURONTIN) 100 MG capsule Take 100 mg by mouth. One tablet by mouth once daily at lunchtime     gabapentin (NEURONTIN) 300 MG capsule Take 300 mg by mouth 3 (three) times daily.     lisinopril-hydrochlorothiazide (ZESTORETIC) 10-12.5 MG tablet Take 1 tablet by mouth daily.     Magnesium 250 MG TABS Take 1 tablet by mouth daily.     meloxicam (MOBIC) 7.5 MG tablet Take 1 tablet by mouth 2 (two) times daily.     Multiple Vitamin (MULTI-VITAMIN) tablet Take 1 tablet by mouth daily.     nystatin (MYCOSTATIN/NYSTOP) powder Apply 1 application topically 2 (two) times daily.     propranolol (INDERAL) 20 MG tablet Take 20 mg by mouth daily as needed.     temazepam (RESTORIL) 30 MG capsule Take 30 mg by mouth daily as needed.     Vitamin D, Ergocalciferol, (DRISDOL) 1.25 MG (50000 UNIT) CAPS capsule Take 1 capsule (50,000 Units total) by mouth once a week. 12 capsule 0   No current facility-administered medications on file prior to visit.     PAST SURGICAL HISTORY Past Surgical History:  Procedure Laterality Date   ABDOMINAL HYSTERECTOMY     APPENDECTOMY     CHOLECYSTECTOMY     JOINT REPLACEMENT      FAMILY HISTORY: Family History  Problem Relation Age of Onset   Hypertension Mother    Heart attack Mother    Lung cancer Father    Prostate cancer Father    Kidney cancer Brother     SOCIAL HISTORY:  reports that she has never smoked. She has never used smokeless tobacco. She reports current alcohol use. She reports that she does not use drugs.  PERFORMANCE STATUS: The patient's performance status is 1 - Symptomatic but completely ambulatory  PHYSICAL EXAM: Most Recent Vital Signs: Blood pressure (!) 146/68, pulse 81, temperature 98.3 F (36.8 C), temperature source Oral, resp. rate 18, weight 167 lb 1.9 oz (75.8 kg), SpO2 100 %. BP (!) 146/68 (BP Location: Left Arm, Patient Position: Sitting)   Pulse  81   Temp 98.3 F (36.8 C) (Oral)   Resp 18   Wt 167 lb 1.9 oz (75.8 kg)   SpO2 100%   BMI 28.69 kg/m   General Appearance:    Alert, cooperative, no distress, appears stated age  Head:    Normocephalic, without obvious abnormality, atraumatic  Eyes:    PERRL, conjunctiva/corneas clear, EOM's intact, fundi    benign, both eyes        Throat:   Lips, mucosa, and tongue normal; teeth and gums normal  Neck:   Supple, symmetrical, trachea midline, no adenopathy;  thyroid:  no enlargement/tenderness/nodules; no carotid   bruit or JVD  Back:     Symmetric, no curvature, ROM normal, no CVA tenderness  Lungs:     Clear to auscultation bilaterally, respirations unlabored  Chest Wall:    No tenderness or deformity   Heart:    Regular rate and rhythm, S1 and S2 normal, no murmur, rub   or gallop     Abdomen:     Soft, non-tender, bowel sounds active all four quadrants,    no masses, no organomegaly        Extremities:   Extremities normal, atraumatic, no cyanosis or edema  Pulses:   2+ and symmetric all extremities  Skin:   Skin color, texture, turgor normal, no rashes or lesions  Lymph nodes:   Cervical, supraclavicular, and axillary nodes normal  Neurologic:   CNII-XII intact, normal strength, sensation and reflexes    throughout    LABORATORY DATA:  Results for orders placed or performed in visit on 09/07/21 (from the past 48 hour(s))  CBC with Differential (Cancer Center Only)     Status: Abnormal   Collection Time: 09/07/21  2:33 PM  Result Value Ref Range   WBC Count 5.4 4.0 - 10.5 K/uL   RBC 3.94 3.87 - 5.11 MIL/uL   Hemoglobin 11.9 (L) 12.0 - 15.0 g/dL   HCT 36.6 36.0 - 46.0 %   MCV 92.9 80.0 - 100.0 fL   MCH 30.2 26.0 - 34.0 pg   MCHC 32.5 30.0 - 36.0 g/dL   RDW 12.0 11.5 - 15.5 %   Platelet Count 226 150 - 400 K/uL   nRBC 0.0 0.0 - 0.2 %   Neutrophils Relative % 56 %   Neutro Abs 3.0 1.7 - 7.7 K/uL   Lymphocytes Relative 31 %   Lymphs Abs 1.7 0.7 - 4.0 K/uL    Monocytes Relative 7 %   Monocytes Absolute 0.4 0.1 - 1.0 K/uL   Eosinophils Relative 4 %   Eosinophils Absolute 0.2 0.0 - 0.5 K/uL   Basophils Relative 1 %   Basophils Absolute 0.0 0.0 - 0.1 K/uL   Immature Granulocytes 1 %   Abs Immature Granulocytes 0.05 0.00 - 0.07 K/uL    Comment: Performed at [facility] at [facility], [address] [place], [place] [address]  CMP ([place] only)     Status: Abnormal   Collection Time: 09/07/21  2:33 PM  Result Value Ref Range   Sodium 135 135 - 145 mmol/L   Potassium 3.7 3.5 - 5.1 mmol/L   Chloride 98 98 - 111 mmol/L   CO2 30 22 - 32 mmol/L   Glucose, Bld 106 (H) 70 - 99 mg/dL    Comment: Glucose reference range applies only to samples taken after fasting for at least 8 hours.   BUN 15 8 - 23 mg/dL   Creatinine 0.70 0.44 - 1.00 mg/dL   Calcium 9.5 8.9 - 10.3 mg/dL   Total Protein 6.6 6.5 - 8.1 g/dL   Albumin 4.1 3.5 - 5.0 g/dL   AST 30 15 - 41 U/L   ALT 31 0 - 44 U/L   Alkaline Phosphatase 85 38 - 126 U/L   Total Bilirubin 0.3 0.3 - 1.2 mg/dL   GFR, Estimated >60 >60 mL/min    Comment: (NOTE) Calculated using the CKD-EPI Creatinine Equation (2021)    Anion gap 7 5 - 15    Comment: Performed at [facility] at [facility], [address], [place], [place] [address]  Reticulocytes     Status: None   Collection Time: 09/07/21  2:33 PM  Result Value Ref Range   Retic Ct Pct 1.6 0.4 - 3.1 %   RBC. 3.91 3.87 - 5.11 MIL/uL   Retic Count, Absolute 62.2 19.0 - 186.0 K/uL   Immature Retic Fract 6.9 2.3 - 15.9 %    Comment: Performed at [facility] Lab at [facility], [address] [place], [place] [address]  Save Smear [facility])     Status: None   Collection Time: 09/07/21  2:33 PM  Result Value Ref Range   Smear Review SMEAR STAINED AND AVAILABLE FOR REVIEW     Comment: Performed at [facility] at [facility], [address] [place], [place] [address]      RADIOGRAPHY: No results found.     PATHOLOGY: None  ASSESSMENT/PLAN: Ms. 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  B 12 and vit D managed by PCP.  Iron studies are pending. We will change her to IV iron if needed. She will continue her oral iron supplement for now.  Port flush every 6 weeks, follow-up in 3 months.   All questions were answered. The patient knows to call the clinic with any problems, questions or concerns. We can certainly see the patient much sooner if necessary.   Lottie Dawson, NP

## 2021-09-07 NOTE — Patient Instructions (Signed)
Implanted Port Home Guide An implanted port is a device that is placed under the skin. It is usually placed in the chest. The device can be used to give IV medicine, to take blood, or for dialysis. You may have an implanted port if: You need IV medicine that would be irritating to the small veins in your hands or arms. You need IV medicines, such as antibiotics, for a long period of time. You need IV nutrition for a long period of time. You need dialysis. When you have a port, your health care provider can choose to use the port instead of veins in your arms for these procedures. You may have fewer limitations when using a port than you would if you used other types of long-term IVs, and you will likely be able to return to normal activities after your incision heals. An implanted port has two main parts: Reservoir. The reservoir is the part where a needle is inserted to give medicines or draw blood. The reservoir is round. After it is placed, it appears as a small, raised area under your skin. Catheter. The catheter is a thin, flexible tube that connects the reservoir to a vein. Medicine that is inserted into the reservoir goes into the catheter and then into the vein. How is my port accessed? To access your port: A numbing cream may be placed on the skin over the port site. Your health care provider will put on a mask and sterile gloves. The skin over your port will be cleaned carefully with a germ-killing soap and allowed to dry. Your health care provider will gently pinch the port and insert a needle into it. Your health care provider will check for a blood return to make sure the port is in the vein and is not clogged. If your port needs to remain accessed to get medicine continuously (constant infusion), your health care provider will place a clear bandage (dressing) over the needle site. The dressing and needle will need to be changed every week, or as told by your health care provider. What  is flushing? Flushing helps keep the port from getting clogged. Follow instructions from your health care provider about how and when to flush the port. Ports are usually flushed with saline solution or a medicine called heparin. The need for flushing will depend on how the port is used: If the port is only used from time to time to give medicines or draw blood, the port may need to be flushed: Before and after medicines have been given. Before and after blood has been drawn. As part of routine maintenance. Flushing may be recommended every 4-6 weeks. If a constant infusion is running, the port may not need to be flushed. Throw away any syringes in a disposal container that is meant for sharp items (sharps container). You can buy a sharps container from a pharmacy, or you can make one by using an empty hard plastic bottle with a cover. How long will my port stay implanted? The port can stay in for as long as your health care provider thinks it is needed. When it is time for the port to come out, a surgery will be done to remove it. The surgery will be similar to the procedure that was done to put the port in. Follow these instructions at home:  Flush your port as told by your health care provider. If you need an infusion over several days, follow instructions from your health care provider about how   to take care of your port site. Make sure you: Wash your hands with soap and water before you change your dressing. If soap and water are not available, use alcohol-based hand sanitizer. Change your dressing as told by your health care provider. Place any used dressings or infusion bags into a plastic bag. Throw that bag in the trash. Keep the dressing that covers the needle clean and dry. Do not get it wet. Do not use scissors or sharp objects near the tube. Keep the tube clamped, unless it is being used. Check your port site every day for signs of infection. Check for: Redness, swelling, or  pain. Fluid or blood. Pus or a bad smell. Protect the skin around the port site. Avoid wearing bra straps that rub or irritate the site. Protect the skin around your port from seat belts. Place a soft pad over your chest if needed. Bathe or shower as told by your health care provider. The site may get wet as long as you are not actively receiving an infusion. Return to your normal activities as told by your health care provider. Ask your health care provider what activities are safe for you. Carry a medical alert card or wear a medical alert bracelet at all times. This will let health care providers know that you have an implanted port in case of an emergency. Get help right away if: You have redness, swelling, or pain at the port site. You have fluid or blood coming from your port site. You have pus or a bad smell coming from the port site. You have a fever. Summary Implanted ports are usually placed in the chest for long-term IV access. Follow instructions from your health care provider about flushing the port and changing bandages (dressings). Take care of the area around your port by avoiding clothing that puts pressure on the area, and by watching for signs of infection. Protect the skin around your port from seat belts. Place a soft pad over your chest if needed. Get help right away if you have a fever or you have redness, swelling, pain, drainage, or a bad smell at the port site. This information is not intended to replace advice given to you by your health care provider. Make sure you discuss any questions you have with your health care provider. Document Revised: 02/02/2021 Document Reviewed: 03/29/2020 Elsevier Patient Education  2022 [employer] Inc.  

## 2021-09-08 ENCOUNTER — Telehealth: Payer: Self-pay

## 2021-09-08 ENCOUNTER — Encounter: Payer: Self-pay | Admitting: Family

## 2021-09-08 NOTE — Telephone Encounter (Signed)
-----   Message from Erenest Blank, NP sent at 09/08/2021  9:21 AM EDT ----- Iron stable at this time. Continue oral supplement if tolerable. If GI upset just stop and we will re-evaluate her counts at follow-up and give Iv iron if needed.  Thank you!   ----- Message ----- From: Interface, Lab In [place] Sent: 09/07/2021   2:51 PM EDT To: Erenest Blank, NP

## 2021-09-08 NOTE — Telephone Encounter (Signed)
Responded via MyChart.

## 2021-09-15 ENCOUNTER — Ambulatory Visit: Payer: Medicare Other | Admitting: Medical-Surgical

## 2021-09-26 ENCOUNTER — Ambulatory Visit (INDEPENDENT_AMBULATORY_CARE_PROVIDER_SITE_OTHER): Payer: Medicare Other

## 2021-09-26 ENCOUNTER — Ambulatory Visit (INDEPENDENT_AMBULATORY_CARE_PROVIDER_SITE_OTHER): Payer: Medicare Other | Admitting: Medical-Surgical

## 2021-09-26 ENCOUNTER — Other Ambulatory Visit: Payer: Self-pay

## 2021-09-26 VITALS — BP 151/72 | HR 87 | Ht 64.0 in | Wt 163.2 lb

## 2021-09-26 DIAGNOSIS — S99921A Unspecified injury of right foot, initial encounter: Secondary | ICD-10-CM | POA: Diagnosis not present

## 2021-09-26 DIAGNOSIS — E538 Deficiency of other specified B group vitamins: Secondary | ICD-10-CM | POA: Diagnosis not present

## 2021-09-26 MED ORDER — CYANOCOBALAMIN 1000 MCG/ML IJ SOLN
1000.0000 ug | Freq: Once | INTRAMUSCULAR | Status: AC
  Administered 2021-09-26: 1000 ug via INTRAMUSCULAR

## 2021-09-26 NOTE — Progress Notes (Signed)
Pt is here for  a vitamin B12 injection. Denies GI problems or dizziness.  Pt tolerated injection well without complications. Pt advised to schedule next injection in 30 days.   Location: RD 
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
## 2021-10-03 ENCOUNTER — Ambulatory Visit (INDEPENDENT_AMBULATORY_CARE_PROVIDER_SITE_OTHER): Payer: Medicare Other | Admitting: Licensed Clinical Social Worker

## 2021-10-03 DIAGNOSIS — F332 Major depressive disorder, recurrent severe without psychotic features: Secondary | ICD-10-CM | POA: Diagnosis not present
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
## 2021-10-10 ENCOUNTER — Other Ambulatory Visit: Payer: Self-pay

## 2021-10-10 DIAGNOSIS — G4700 Insomnia, unspecified: Secondary | ICD-10-CM

## 2021-10-10 MED ORDER — TEMAZEPAM 30 MG PO CAPS: 30.0000 mg | ORAL_CAPSULE | Freq: Every day | ORAL | 0 refills | Status: DC | PRN

## 2021-10-10 NOTE — Progress Notes (Signed)
Pt called and requested refill on her temazepam. Refill pended.

## 2021-10-10 NOTE — Progress Notes (Signed)
Refill sent.

## 2021-10-17 ENCOUNTER — Encounter: Payer: Self-pay | Admitting: Family

## 2021-10-24 ENCOUNTER — Ambulatory Visit (INDEPENDENT_AMBULATORY_CARE_PROVIDER_SITE_OTHER): Payer: Medicare Other | Admitting: Medical-Surgical

## 2021-10-24 ENCOUNTER — Other Ambulatory Visit: Payer: Self-pay

## 2021-10-24 DIAGNOSIS — E538 Deficiency of other specified B group vitamins: Secondary | ICD-10-CM | POA: Diagnosis not present

## 2021-10-24 MED ORDER — CYANOCOBALAMIN 1000 MCG/ML IJ SOLN
1000.0000 ug | Freq: Once | INTRAMUSCULAR | Status: AC
  Administered 2021-10-24: 10:00:00 1000 ug via INTRAMUSCULAR

## 2021-10-24 NOTE — Progress Notes (Signed)
Pt is here for a vitamin B12 injection.  Pt denies gastrointestinal problems or dizziness.  Injection administered in left deltoid.  Tiajuana Amass, CMA

## 2021-10-25 ENCOUNTER — Other Ambulatory Visit: Payer: Self-pay

## 2021-10-25 ENCOUNTER — Inpatient Hospital Stay: Payer: Medicare Other | Attending: Hematology & Oncology

## 2021-10-25 ENCOUNTER — Ambulatory Visit (HOSPITAL_COMMUNITY): Payer: Medicare Other | Admitting: Licensed Clinical Social Worker

## 2021-10-25 VITALS — BP 176/61 | HR 75 | Temp 98.2°F | Resp 18

## 2021-10-25 DIAGNOSIS — D508 Other iron deficiency anemias: Secondary | ICD-10-CM | POA: Diagnosis present

## 2021-10-25 DIAGNOSIS — Z452 Encounter for adjustment and management of vascular access device: Secondary | ICD-10-CM | POA: Insufficient documentation

## 2021-10-25 DIAGNOSIS — Z95828 Presence of other vascular implants and grafts: Secondary | ICD-10-CM

## 2021-10-25 DIAGNOSIS — K909 Intestinal malabsorption, unspecified: Secondary | ICD-10-CM | POA: Insufficient documentation

## 2021-10-25 MED ORDER — SODIUM CHLORIDE 0.9% FLUSH
10.0000 mL | INTRAVENOUS | Status: DC | PRN
  Administered 2021-10-25: 10 mL

## 2021-10-25 MED ORDER — HEPARIN SOD (PORK) LOCK FLUSH 100 UNIT/ML IV SOLN
500.0000 [IU] | Freq: Once | INTRAVENOUS | Status: AC | PRN
  Administered 2021-10-25: 500 [IU]

## 2021-10-25 NOTE — Patient Instructions (Signed)

## 2021-10-28 ENCOUNTER — Other Ambulatory Visit: Payer: Self-pay

## 2021-10-28 DIAGNOSIS — F322 Major depressive disorder, single episode, severe without psychotic features: Secondary | ICD-10-CM

## 2021-10-28 MED ORDER — DESVENLAFAXINE SUCCINATE ER 100 MG PO TB24: 100.0000 mg | ORAL_TABLET | Freq: Every day | ORAL | 1 refills | Status: DC

## 2021-10-31 ENCOUNTER — Encounter: Payer: Medicare Other | Admitting: Medical-Surgical

## 2021-10-31 NOTE — Progress Notes (Signed)
Erroneous encounter. Please disregard.

## 2021-11-01 ENCOUNTER — Ambulatory Visit (INDEPENDENT_AMBULATORY_CARE_PROVIDER_SITE_OTHER): Payer: Medicare Other | Admitting: Licensed Clinical Social Worker

## 2021-11-01 DIAGNOSIS — F332 Major depressive disorder, recurrent severe without psychotic features: Secondary | ICD-10-CM

## 2021-11-01 DIAGNOSIS — F411 Generalized anxiety disorder: Secondary | ICD-10-CM | POA: Diagnosis not present

## 2021-11-01 NOTE — Progress Notes (Addendum)
Patient met in person with therapist in office  THERAPIST PROGRESS NOTE  Session Time: 10:00 AM to 10:55 AM  Participation Level: Active  Behavioral Response: CasualAlertDysphoric Appropriate in session no tearful couple times talking about the holiday Type of Therapy: Individual Therapy  Treatment Goals addressed:  Depression and anxiety, self-esteem, guilt, grief, coping Interventions: Solution Focused, Strength-based, Supportive, Reframing, and Other: Coping  Summary: Heather Thompson is a 70 y.o. female who presents with tearful over the holidays but is going to spend time with her children and grandchildren.  Discussed difficulty as they have their own lives therapist pointed out and reframing they are still part of her life.  At the same time working therapy is starting to create her own life for herself here now that she is lost her husband.  Spent a large part of session completing assessment as last session patient sidetracked off of the questions although gave important information but a lot of the questions on the assessment needed to be filled as well as doing pain assessment nutrition assessment and PHQ-9 and C-SSRS.  Provided praise for patient developing relationships here even with mental health symptoms noting this is helpful developing her life and shows strength on patient's part.  Noted symptoms confirm diagnosis of severe depression and generalized anxiety disorder.  We will complete treatment plan at next session.  Therapist provided active listening open questions supportive interventions.  Noted family wants her to not be tearful and therapist provided her perspective that would be natural for patient to be sad and give herself time that even if she has to step away, putting on a good face has the advantage that is the expression goes "fake it to make it" could help as we can change her actions and to feeling different at times but at the same time giving her the space she needs to  lower herself to legitimately feel sadness and grief for losses possibly shorten them so they do not take over too much during holidays but give herself time to enjoy being with family as she lives long distance and does not have as many opportunities to do that  Suicidal/Homicidal: No  Plan: Return again in 4 weeks.2.  Patient talks to therapist twice a month [place] pays out-of-pocket, would like to see this therapist more and is going to look into it with her insurance therapist agrees and is recommended for patient to have more frequent sessions. 3.  Therapist work with patient on grief, depression anxiety, work through guilt feelings from the past helping her to stabilize and new community  Diagnosis: Axis I:  major depressive disorder, recurrent, severe, generalized anxiety disorder    Axis II: No diagnosis    Heather Register, LCSW 11/01/2021

## 2021-11-04 ENCOUNTER — Other Ambulatory Visit: Payer: Self-pay | Admitting: Medical-Surgical

## 2021-11-08 ENCOUNTER — Encounter: Payer: Self-pay | Admitting: Medical-Surgical

## 2021-11-08 ENCOUNTER — Encounter: Payer: Medicare Other | Admitting: Medical-Surgical

## 2021-11-08 ENCOUNTER — Other Ambulatory Visit: Payer: Self-pay

## 2021-11-08 ENCOUNTER — Ambulatory Visit (HOSPITAL_COMMUNITY): Payer: Medicare Other | Admitting: Licensed Clinical Social Worker

## 2021-11-08 ENCOUNTER — Ambulatory Visit (INDEPENDENT_AMBULATORY_CARE_PROVIDER_SITE_OTHER): Payer: Medicare Other | Admitting: Medical-Surgical

## 2021-11-08 ENCOUNTER — Encounter: Payer: Self-pay | Admitting: Family

## 2021-11-08 VITALS — BP 135/78 | HR 62 | Resp 20 | Ht 64.0 in | Wt 164.0 lb

## 2021-11-08 DIAGNOSIS — G4700 Insomnia, unspecified: Secondary | ICD-10-CM

## 2021-11-08 DIAGNOSIS — R413 Other amnesia: Secondary | ICD-10-CM

## 2021-11-08 DIAGNOSIS — M79671 Pain in right foot: Secondary | ICD-10-CM

## 2021-11-08 DIAGNOSIS — I10 Essential (primary) hypertension: Secondary | ICD-10-CM

## 2021-11-08 DIAGNOSIS — F322 Major depressive disorder, single episode, severe without psychotic features: Secondary | ICD-10-CM

## 2021-11-08 MED ORDER — NYSTATIN 100000 UNIT/GM EX POWD: 1.0000 "application " | Freq: Two times a day (BID) | CUTANEOUS | 2 refills | Status: DC

## 2021-11-08 MED ORDER — TEMAZEPAM 30 MG PO CAPS: 30.0000 mg | ORAL_CAPSULE | Freq: Every day | ORAL | 0 refills | Status: DC | PRN

## 2021-11-08 MED ORDER — MELOXICAM 7.5 MG PO TABS: 7.5000 mg | ORAL_TABLET | Freq: Two times a day (BID) | ORAL | 2 refills | Status: DC

## 2021-11-08 MED ORDER — PROPRANOLOL HCL 20 MG PO TABS: 20.0000 mg | ORAL_TABLET | Freq: Every day | ORAL | 0 refills | Status: DC | PRN

## 2021-11-08 MED ORDER — BUPROPION HCL ER (XL) 300 MG PO TB24: 300.0000 mg | ORAL_TABLET | Freq: Every day | ORAL | 1 refills | Status: DC

## 2021-11-08 NOTE — Progress Notes (Signed)
HPI with pertinent ROS:   CC: Follow up  HPI: Very pleasant 70 year old female presenting today for 87-month follow-up on the following:  Chronic pain-has been to neurosurgery and has a plan and for an upcoming spinal cord stimulator to be placed.  She was referred from neurosurgery to pain management and they are working with her on her current regimen.  Right foot-Continues to have concerns with a right toenail being black after a traumatic injury over the summer.  Her x-ray did not show any fractures however her toe still does not feel right.  She has also developed a bit of a hard knot on the top of her right foot which is causing some discomfort when she wears certain shoes.  Mood-currently taking Pristiq 100 mg daily and Wellbutrin 150 mg daily as prescribed, tolerating well without side effects.  Has been seeing [facility] for counseling and has had 2 sessions so far.  She notes that she likes her a lot but she is very concerned because her insurance only covers once a month for 1 hour of therapy.  She notes that she does not feel like this is enough to be helpful and hopes there is something we can do on our end to give more coverage for her.  In the meantime, she does feel like she still needs a little bit more support and would like to see about increasing her Wellbutrin if possible.  Is excited about going to see her kids in [place] for the holiday.  Denies SI/HI.  Memory concerns-previously taking gabapentin 300 mg 2-3 times daily but has cut back as she was concerned about memory issues.  She is currently taking 100 mg twice daily and wonders if she may be able to completely come off that medication since she is not feeling much benefit from it.  I reviewed the past medical history, family history, social history, surgical history, and allergies today and no changes were needed.  Please see the problem list section below in epic for further details.   Physical exam:    General: Well Developed, well nourished, and in no acute distress.  Neuro: Alert and oriented x3.  HEENT: Normocephalic, atraumatic.  Skin: Warm and dry. Cardiac: Regular rate and rhythm, no murmurs rubs or gallops, no lower extremity edema.  Respiratory: Clear to auscultation bilaterally. Not using accessory muscles, speaking in full sentences.  Impression and Recommendations:    1. Severe major depression (HCC) Continue Pristiq 100 mg daily.  Increasing Wellbutrin to 300 mg daily.  Continue counseling.  Message sent to our referral coordinator to see if there is anything we can do on our end to help obtain more coverage for mental health services for her.  2. Insomnia, unspecified type Continue temazepam 30 mg daily as needed at bedtime. - temazepam (RESTORIL) 30 MG capsule; Take 1 capsule (30 mg total) by mouth daily as needed.  Dispense: 30 capsule; Refill: 0  3. Right foot pain Likely soft tissue injury to the right great toe.  If there was a fracture present, the x-ray was taken at such a late date that this was likely already healed.  Knot on the top of her foot looks like tarsal bossing to me.  Would like to get podiatry's input.  Referral entered. - Ambulatory referral to Podiatry  4. Memory changes Discussed the use of gabapentin with patient.  This is possibly one of the things contributing to her memory issues but there also may be other contributing factors  such as the use of temazepam as well as depression.  For now, if she would like to stop gabapentin, I would recommend reducing it to 100 mg nightly for the next few nights then okay to stop.  5. Primary hypertension Blood pressure elevated on initial check but recheck was at goal.  Continue lisinopril-HCTZ 10-12 g daily as prescribed.  Return in about 6 weeks (around 12/20/2021) for mood follow up. ___________________________________________ Thayer Ohm, DNP, APRN, FNP-BC Primary Care and Sports Medicine [facility] [place]

## 2021-11-08 NOTE — Progress Notes (Signed)
Erroneous encounter

## 2021-11-22 ENCOUNTER — Encounter: Payer: Self-pay | Admitting: Family

## 2021-11-25 ENCOUNTER — Encounter: Payer: Self-pay | Admitting: Medical-Surgical

## 2021-11-25 ENCOUNTER — Telehealth (INDEPENDENT_AMBULATORY_CARE_PROVIDER_SITE_OTHER): Payer: Medicare Other | Admitting: Medical-Surgical

## 2021-11-25 ENCOUNTER — Other Ambulatory Visit: Payer: Self-pay

## 2021-11-25 VITALS — Temp 98.9°F | Wt 155.0 lb

## 2021-11-25 DIAGNOSIS — J060 Acute laryngopharyngitis: Secondary | ICD-10-CM | POA: Diagnosis not present

## 2021-11-25 MED ORDER — AZITHROMYCIN 250 MG PO TABS: ORAL_TABLET | ORAL | 0 refills | Status: AC

## 2021-11-25 NOTE — Progress Notes (Signed)
Virtual Visit via Video Note  I connected with Heather Thompson on 11/25/21 at  3:30 PM EST by a video enabled telemedicine application and verified that I am speaking with the correct person using two identifiers.   I discussed the limitations of evaluation and management by telemedicine and the availability of in person appointments. The patient expressed understanding and agreed to proceed.  Patient location: home Provider locations: office  Subjective:    CC: Hoarseness  HPI: Very pleasant 70 year old female presenting via MyChart video visit with complaints of a scratchy throat and hoarseness that started during her recent vacation to [place] to visit her children for Christmas.  Notes that her son has a dog that spend a lot of time near her and she wonders if she has developed an allergy now.  She does have a mild nonproductive cough and notes this is more of a throat clearing in.  She has no other sick symptoms and does not feel ill.  Notes that daughter has also recently got a dog as the same breed as the 1 her son has.  She was also playing with the dog and felt like her symptoms got a little bit worse.  She has been treating her symptoms with over-the-counter Zyrtec and Sudafed without benefit.  She did test for COVID with negative results.  She has not been seen for her symptoms as she notes she just got home today.  Past medical history, Surgical history, Family history not pertinant except as noted below, Social history, Allergies, and medications have been entered into the medical record, reviewed, and corrections made.   Review of Systems: See HPI for pertinent positives and negatives.   Objective:    General: Speaking clearly in complete sentences without any shortness of breath.  Alert and oriented x3.  Normal judgment. No apparent acute distress.  Impression and Recommendations:    1. Sore throat and laryngitis Greater than 2 weeks of symptoms with sore throat and  hoarseness not responsive to conservative measures and over-the-counter agents.  Sending in azithromycin 500 mg today followed by 250 mg daily for the next 4 days.  Discussed conservative measures including salt water gargles, Chloraseptic throat spray, throat lozenges, warm liquids, and tea with honey.  Recommend staying well-hydrated and try to rest her voice.  Advised patient to notify me should she not be feeling some better by Tuesday.  Hoarseness may last several weeks before resolution.  I discussed the assessment and treatment plan with the patient. The patient was provided an opportunity to ask questions and all were answered. The patient agreed with the plan and demonstrated an understanding of the instructions.   The patient was advised to call back or seek an in-person evaluation if the symptoms worsen or if the condition fails to improve as anticipated.  20 minutes of non-face-to-face time was provided during this encounter.  Return if symptoms worsen or fail to improve.  Thayer Ohm, DNP, APRN, FNP-BC [facility]

## 2021-11-27 HISTORY — PX: CARPAL TUNNEL RELEASE: SHX101

## 2021-11-29 ENCOUNTER — Ambulatory Visit (INDEPENDENT_AMBULATORY_CARE_PROVIDER_SITE_OTHER): Payer: Medicare Other | Admitting: Medical-Surgical

## 2021-11-29 ENCOUNTER — Telehealth (HOSPITAL_COMMUNITY): Payer: Self-pay | Admitting: Licensed Clinical Social Worker

## 2021-11-29 ENCOUNTER — Ambulatory Visit (INDEPENDENT_AMBULATORY_CARE_PROVIDER_SITE_OTHER): Payer: Medicare Other | Admitting: Licensed Clinical Social Worker

## 2021-11-29 ENCOUNTER — Other Ambulatory Visit: Payer: Self-pay

## 2021-11-29 VITALS — BP 153/72 | HR 78

## 2021-11-29 DIAGNOSIS — F411 Generalized anxiety disorder: Secondary | ICD-10-CM

## 2021-11-29 DIAGNOSIS — F332 Major depressive disorder, recurrent severe without psychotic features: Secondary | ICD-10-CM | POA: Diagnosis not present

## 2021-11-29 DIAGNOSIS — E538 Deficiency of other specified B group vitamins: Secondary | ICD-10-CM | POA: Diagnosis not present

## 2021-11-29 MED ORDER — CYANOCOBALAMIN 1000 MCG/ML IJ SOLN
1000.0000 ug | Freq: Once | INTRAMUSCULAR | Status: AC
  Administered 2021-11-29: 1000 ug via INTRAMUSCULAR

## 2021-11-29 NOTE — Telephone Encounter (Signed)
Therapist called patient to update her on information she found about patient's insurance.  Therapist talked office staff who said with Medicare these plans do not set limits on therapy appointments so not sure why representative from Medicare said she can only see therapist once a month.  Additionally she has other plan would cover what Medicare did not.  Patient will call Medicare again to double check information she received.

## 2021-11-29 NOTE — Progress Notes (Signed)
Agree with documentation as above.  ? ?___________________________________________ ?Stanislaw Acton L. Andrey Hoobler, DNP, APRN, FNP-BC ?Primary Care and Sports Medicine ?[facility] ? ?

## 2021-11-29 NOTE — Progress Notes (Signed)
° °Established Patient Office Visit ° °Subjective:  °Patient ID: Heather Thompson, female    DOB: 05/31/1951  Age: 71 y.o. MRN: [id number] ° °CC:  °Chief Complaint  °Patient presents with  ° Pernicious Anemia  ° ° °HPI °Heather Thompson is here for a vitamin B 12 injection. Denies muscle cramps, weakness or irregular heart rate.   ° °She has been sick with congestion and was treated with a Zpak. She is also taking Sudafed for the congestion. Her blood pressure is elevated.  ° °Past Medical History:  °Diagnosis Date  ° Allergy   ° Anemia   ° Clotting disorder (HCC)   ° Depression   ° Fibromyalgia   ° Hypertension   ° Hypothyroidism   ° Muscle disorder   ° Salzmann nodular degeneration   ° ° °Past Surgical History:  °Procedure Laterality Date  ° ABDOMINAL HYSTERECTOMY    ° APPENDECTOMY    ° CHOLECYSTECTOMY    ° JOINT REPLACEMENT    ° ° °Family History  °Problem Relation Age of Onset  ° Hypertension Mother   ° Heart attack Mother   ° Lung cancer Father   ° Prostate cancer Father   ° Kidney cancer Brother   ° ° °Social History  ° °Socioeconomic History  ° Marital status: Widowed  °  Spouse name: Not on file  ° Number of children: Not on file  ° Years of education: Not on file  ° Highest education level: Not on file  °Occupational History  ° Not on file  °Tobacco Use  ° Smoking status: Never  ° Smokeless tobacco: Never  °Vaping Use  ° Vaping Use: Never used  °Substance and Sexual Activity  ° Alcohol use: Yes  °  Comment: rarely  ° Drug use: Never  ° Sexual activity: Not Currently  °Other Topics Concern  ° Not on file  °Social History Narrative  ° Not on file  ° °Social Determinants of Health  ° °Financial Resource Strain: Not on file  °Food Insecurity: Not on file  °Transportation Needs: Not on file  °Physical Activity: Not on file  °Stress: Not on file  °Social Connections: Not on file  °Intimate Partner Violence: Not on file  ° ° °Outpatient Medications Prior to Visit  °Medication Sig Dispense Refill  ° azithromycin (ZITHROMAX)  250 MG tablet Take 2 tablets on day 1, then 1 tablet daily on days 2 through 5 6 tablet 0  ° buPROPion (WELLBUTRIN XL) 300 MG 24 hr tablet Take 1 tablet (300 mg total) by mouth daily. 90 tablet 1  ° Cyanocobalamin 1000 MCG/ML KIT Inject as directed.    ° desvenlafaxine (PRISTIQ) 100 MG 24 hr tablet Take 1 tablet (100 mg total) by mouth daily. 90 tablet 1  ° gabapentin (NEURONTIN) 100 MG capsule Take 100 mg by mouth. One tablet by mouth once daily at lunchtime    ° gabapentin (NEURONTIN) 300 MG capsule Take 300 mg by mouth 3 (three) times daily.    ° lisinopril-hydrochlorothiazide (ZESTORETIC) 10-12.5 MG tablet Take 1 tablet by mouth daily.    ° Magnesium 250 MG TABS Take 1 tablet by mouth daily.    ° meloxicam (MOBIC) 7.5 MG tablet Take 1 tablet (7.5 mg total) by mouth 2 (two) times daily. 180 tablet 2  ° Multiple Vitamin (MULTI-VITAMIN) tablet Take 1 tablet by mouth daily.    ° nystatin (MYCOSTATIN/NYSTOP) powder Apply 1 application topically 2 (two) times daily. 15 g 2  ° propranolol (INDERAL) 20 MG tablet Take   Established Patient Office Visit  Subjective:  Patient ID: Heather Thompson, female    DOB: [birth year]  Age: 71 y.o. MRN: [id number]  CC:  Chief Complaint  Patient presents with   Pernicious Anemia    HPI Heather Thompson is here for a vitamin B 12 injection. Denies muscle cramps, weakness or irregular heart rate.    She has been sick with congestion and was treated with a Zpak. She is also taking Sudafed for the congestion. Her blood pressure is elevated.   Past Medical History:  Diagnosis Date   Allergy    Anemia    Clotting disorder ([place])    Depression    Fibromyalgia    Hypertension    Hypothyroidism    Muscle disorder    Salzmann nodular degeneration     Past Surgical History:  Procedure Laterality Date   ABDOMINAL HYSTERECTOMY     APPENDECTOMY     CHOLECYSTECTOMY     JOINT REPLACEMENT      Family History  Problem Relation Age of Onset   Hypertension Mother    Heart attack Mother    Lung cancer Father    Prostate cancer Father    Kidney cancer Brother     Social History   Socioeconomic History   Marital status: Widowed    Spouse name: Not on file   Number of children: Not on file   Years of education: Not on file   Highest education level: Not on file  Occupational History   Not on file  Tobacco Use   Smoking status: Never   Smokeless tobacco: Never  Vaping Use   Vaping Use: Never used  Substance and Sexual Activity   Alcohol use: Yes    Comment: rarely   Drug use: Never   Sexual activity: Not Currently  Other Topics Concern   Not on file  Social History Narrative   Not on file   Social Determinants of Health   Financial Resource Strain: Not on file  Food Insecurity: Not on file  Transportation Needs: Not on file  Physical Activity: Not on file  Stress: Not on file  Social Connections: Not on file  Intimate Partner Violence: Not on file    Outpatient Medications Prior to Visit  Medication Sig Dispense Refill   azithromycin (ZITHROMAX)  250 MG tablet Take 2 tablets on day 1, then 1 tablet daily on days 2 through 5 6 tablet 0   buPROPion (WELLBUTRIN XL) 300 MG 24 hr tablet Take 1 tablet (300 mg total) by mouth daily. 90 tablet 1   Cyanocobalamin 1000 MCG/ML KIT Inject as directed.     desvenlafaxine (PRISTIQ) 100 MG 24 hr tablet Take 1 tablet (100 mg total) by mouth daily. 90 tablet 1   gabapentin (NEURONTIN) 100 MG capsule Take 100 mg by mouth. One tablet by mouth once daily at lunchtime     gabapentin (NEURONTIN) 300 MG capsule Take 300 mg by mouth 3 (three) times daily.     lisinopril-hydrochlorothiazide (ZESTORETIC) 10-12.5 MG tablet Take 1 tablet by mouth daily.     Magnesium 250 MG TABS Take 1 tablet by mouth daily.     meloxicam (MOBIC) 7.5 MG tablet Take 1 tablet (7.5 mg total) by mouth 2 (two) times daily. 180 tablet 2   Multiple Vitamin (MULTI-VITAMIN) tablet Take 1 tablet by mouth daily.     nystatin (MYCOSTATIN/NYSTOP) powder Apply 1 application topically 2 (two) times daily. 15 g 2   propranolol (INDERAL) 20 MG tablet

## 2021-11-29 NOTE — Progress Notes (Signed)
THERAPIST PROGRESS NOTE  Session Time: 10:00 AM to 10:54 AM  Participation Level: Active  Behavioral Response: CasualAlertDysphoric  Type of Therapy: Individual Therapy  Treatment Goals addressed:  Depression and anxiety, self-esteem, guilt, grief, coping Interventions: Solution Focused, Strength-based, Supportive, and Other: grief  Summary: Heather Thompson is a 71 y.o. female who presents with sharing however holidays was not shared feeling like a person not there(her husband). Tells herself should be getting passed it and not happening. Every occasion he is missing. Christmas hard. Makes some good close friends at church. Daughter gave her a Christmas stocking helped make a part of their family, special. Related didn't have a car there and independent, had to be dependent on others. Felt a burden, imposition really hard. Made it worse sitting around in head feeling worse having a pity party. Christmas Eve still difficult. Do crafts. Seeing eye specialist doesn't know if can do cornea transplants.  Patient talked about regrets feel she should never have told her husband unhappy in [place].  Told him that every day and was crying every day.  Reviewed the moved to [place] they have planned it and where the middle of the plan when he got sick and then lost eyesight. Went downhill. He said worst mistake, did for her thought it was she wanted but a disaster.  Shared what it was like when he was getting sick he had always been a worker and sitting there while she manage move, destroyed him, couldn't see dream home(couldn't see), wheelchair beyond devastating, last 6 days unresponsive.  She was the one who told him there was a mass on brain.  Noted input therapist agreed God carries [place] otherwise not knowing how we get through some things. Watching man not the man know. Deteriorated to nothing. He tried so hard but he knew failing not had been the man was whole life. Worse regreat was not there the day he  passed. She went to get COVID test and said would be by his side. People told her hearing last to go meeting he would have hurt her when she came back, also thoughtfulness of him that didn't want to pass when there, saved her from that. Son, Heather Thompson, angry they went to [place].  Patient understands that it is his guilt that when [name] had heart attack and losing his sight describes him as heart hardened type of person and stayed away and didn't see him as much. Blamed on work and family. His guilt should have been there more. Distance with her. Not her problem his problem. Everyone sees it how to bring it up to him.  Patient explains only can see therapist once a month because of insurance.  Exploring with [facility]. Dr. Margaretmary Eddy doctor who does therapy. [name] see what can do to get her in. Still red tape. B12 shot with PCP. Doctor seeing what she can do with insurance therapist related she will help if she can.   Therapist reviewed symptoms, facilitated expression of thoughts and feelings particularly helping patient to process through feelings of grief and guilt.  By expressing and label helps to work through it.  Therapist particularly working on patient's guilt feeling pointing out how so much is beyond her control will hold her self response or things helps [place] to cope with things that can overwhelm [place] but have some recognition of she could not control when her husband was going to die, his declining health.  Therapist pointing out who could blame her for wanting to  go to [place] was plans were in the works make the best choice that she can noted patient's own coping helping noting things like husband could hear her when she came back, even though unresponsive to help her with guilt related to not being there for a few minutes when he passed.  Therapist noted the layers building of stress that make things difficult that were happening before he passed in losing house.  Noted the stress of moving it difficult  to validate how difficult it was for patient.  Work with patient on her relationship with son and how his distance provided positive feedback for recognizing that is probably his own guilt for not seeing his father, hard to be able to express that to him therapist noted therapy often is providing general ways of helping people have more insight and that can take time.  Reviewed patient's insurance only covering once a month therapist will call and see if there is a way to authorize more treatment.  Noted patient again being resourceful and looking into [facility] as a resource also helpful she is connected to her church that can help her with resources.  Suicidal/Homicidal: No  Plan: Return again in 2 weeks to 1 month.  2.Therapist work with patient on grief, depression anxiety, work through guilt feelings from the past helping her to stabilize and new community.  Call insurance to see if she can have more appointments.   Diagnosis: Axis I:  major depressive disorder, recurrent, severe, generalized anxiety disorder    Axis II: No diagnosis    Coolidge Breeze, LCSW 11/29/2021

## 2021-12-01 ENCOUNTER — Ambulatory Visit (INDEPENDENT_AMBULATORY_CARE_PROVIDER_SITE_OTHER): Payer: Medicare Other | Admitting: Podiatry

## 2021-12-01 ENCOUNTER — Other Ambulatory Visit: Payer: Self-pay

## 2021-12-01 ENCOUNTER — Ambulatory Visit (INDEPENDENT_AMBULATORY_CARE_PROVIDER_SITE_OTHER): Payer: Medicare Other

## 2021-12-01 ENCOUNTER — Encounter: Payer: Self-pay | Admitting: Podiatry

## 2021-12-01 DIAGNOSIS — M79671 Pain in right foot: Secondary | ICD-10-CM

## 2021-12-01 DIAGNOSIS — S90111S Contusion of right great toe without damage to nail, sequela: Secondary | ICD-10-CM

## 2021-12-01 DIAGNOSIS — M19071 Primary osteoarthritis, right ankle and foot: Secondary | ICD-10-CM

## 2021-12-01 DIAGNOSIS — M19079 Primary osteoarthritis, unspecified ankle and foot: Secondary | ICD-10-CM

## 2021-12-01 NOTE — Progress Notes (Signed)
Subjective:  Patient ID: Heather Thompson, female    DOB: May 08, 1951,   MRN: [id number]  Chief Complaint  Patient presents with   Foot Pain    -right foot pain since summer, injured right hallux toe/toenail    71 y.o. female presents for concern of right great toe injury. Relates last summer she injured the toe and the nail turned black. X-rays were negative for fracture. Relates her toe is painful when wearing certain shoes and she is concerned about a bump on the right foot that is sore all the time and prevents certain shoes.  . Denies any other pedal complaints. Denies n/v/f/c.   Past Medical History:  Diagnosis Date   Allergy    Anemia    Clotting disorder (HCC)    Depression    Fibromyalgia    Hypertension    Hypothyroidism    Muscle disorder    Salzmann nodular degeneration     Objective:  Physical Exam: Vascular: DP/PT pulses 2/4 bilateral. CFT <3 seconds. Normal hair growth on digits. No edema.  Skin. No lacerations or abrasions bilateral feet. Right hallux nail with polish overlying put proximal aspect of nail appears to have underlying hemosiderin deposits and possible clear nail growing underneath.  Musculoskeletal: MMT 5/5 bilateral lower extremities in DF, PF, Inversion and Eversion. Deceased ROM in DF of ankle joint. Mild tenderness to dorsal spur on right foot. Tenderness with motion of the first second and third rays of the right foot. No pain to palpation of the right hallux.  Neurological: Sensation intact to light touch.   Assessment:   1. Arthritis of midfoot   2. Right foot pain   3. Contusion of right great toe without damage to nail, sequela      Plan:  Patient was evaluated and treated and all questions answered. X-rays reviewed and discussed with patient. No acute fractures or dislocations noted.  Retained hardware from TN fusions. Degenerative changes noted throughout the midfoot. Dorsal spurring noted at midfoot joints.  Discussed midfoot arthritis  with patient and treatment options.  Discussed NSAIDS, topicals, and possible injections.  Continue with meloxicam at home.  Discussed stiff soled shoes and carbon fiber foot plate.  Discussed if pain does not improve can discuss surgical options.  Discussed hallux nail and likelihood of nail falling off. At this point since no pain would leave it be.  Patient to follow-up as needed.     Louann Sjogren, DPM

## 2021-12-13 ENCOUNTER — Other Ambulatory Visit: Payer: Self-pay

## 2021-12-13 ENCOUNTER — Other Ambulatory Visit: Payer: Self-pay | Admitting: Medical-Surgical

## 2021-12-13 DIAGNOSIS — G4700 Insomnia, unspecified: Secondary | ICD-10-CM

## 2021-12-13 MED ORDER — TEMAZEPAM 30 MG PO CAPS: 30.0000 mg | ORAL_CAPSULE | Freq: Every day | ORAL | 3 refills | Status: DC | PRN

## 2021-12-13 NOTE — Progress Notes (Unsigned)
Pt requested a refill, last OV 11/08/21

## 2021-12-20 ENCOUNTER — Encounter: Payer: Self-pay | Admitting: Medical-Surgical

## 2021-12-20 ENCOUNTER — Other Ambulatory Visit: Payer: Self-pay

## 2021-12-20 ENCOUNTER — Ambulatory Visit (INDEPENDENT_AMBULATORY_CARE_PROVIDER_SITE_OTHER): Payer: Medicare Other | Admitting: Medical-Surgical

## 2021-12-20 ENCOUNTER — Ambulatory Visit (INDEPENDENT_AMBULATORY_CARE_PROVIDER_SITE_OTHER): Payer: Medicare Other

## 2021-12-20 VITALS — BP 120/67 | HR 64 | Temp 98.1°F | Ht 64.0 in | Wt 166.6 lb

## 2021-12-20 DIAGNOSIS — G8929 Other chronic pain: Secondary | ICD-10-CM | POA: Diagnosis not present

## 2021-12-20 DIAGNOSIS — M130 Polyarthritis, unspecified: Secondary | ICD-10-CM | POA: Diagnosis not present

## 2021-12-20 DIAGNOSIS — G4700 Insomnia, unspecified: Secondary | ICD-10-CM

## 2021-12-20 DIAGNOSIS — M79642 Pain in left hand: Secondary | ICD-10-CM

## 2021-12-20 DIAGNOSIS — F322 Major depressive disorder, single episode, severe without psychotic features: Secondary | ICD-10-CM

## 2021-12-20 DIAGNOSIS — M79641 Pain in right hand: Secondary | ICD-10-CM

## 2021-12-20 DIAGNOSIS — L989 Disorder of the skin and subcutaneous tissue, unspecified: Secondary | ICD-10-CM

## 2021-12-20 DIAGNOSIS — I10 Essential (primary) hypertension: Secondary | ICD-10-CM | POA: Diagnosis not present

## 2021-12-20 DIAGNOSIS — E559 Vitamin D deficiency, unspecified: Secondary | ICD-10-CM

## 2021-12-20 MED ORDER — LISINOPRIL-HYDROCHLOROTHIAZIDE 10-12.5 MG PO TABS: 1.0000 | ORAL_TABLET | Freq: Every day | ORAL | 3 refills | Status: DC

## 2021-12-20 MED ORDER — VITAMIN D (ERGOCALCIFEROL) 1.25 MG (50000 UNIT) PO CAPS: 50000.0000 [IU] | ORAL_CAPSULE | ORAL | 1 refills | Status: DC

## 2021-12-20 NOTE — Progress Notes (Signed)
HPI with pertinent ROS:   CC: Mood/insomnia follow-up  HPI: Very pleasant 71 year old female presenting today for the following:  Mood/insomnia-currently taking does venlafaxine 100 mg daily, tolerating well without side effects.  Also taking Wellbutrin 300 mg daily with no issues.  Using propranolol 20 mg 3 times daily as needed.  Notes that she is still quite depressed and having significant difficulty sleeping at night.  She notes that her symptoms are better than they were however they seem to have stalled out and have not further improved.  She is acutely feeling the absence of her husband as well as the isolation caused by her family being in [place].  She is doing counseling but is only able to do this once a week.  Has an appointment coming up with [facility] to speak to a psychiatrist for medication review and recommendations.  Denies SI/HI.  Musculoskeletal-has a longstanding history of back pain and musculoskeletal concerns.  She has been evaluated recently for possible surgery versus a new spinal cord stimulator.  Unfortunately she has been found to not be a candidate for surgery and insurance will not cover another spinal stimulator.  Her back pain is most uncomfortable at the lower left thoracic spine with standing and the left ischium hitting.  Also notes significant bilateral knee pains.  Reports that she was originally told that she needed a knee replacement but she is here and having to care for herself so this is not an option.  Hand pain-has had pain in her hands for years in the setting of known arthritis however last week, she has had bilateral pain at the base of the thumb which has interfered with her grip and her ability to hold things.  Skin lesions-has a lesion to the right side of her face that appears to be getting larger and is bothersome.  She also has a lesion on the left forearm that is concerning.  Wonders if she should have them evaluated, removed, or see  dermatology if needed.  I reviewed the past medical history, family history, social history, surgical history, and allergies today and no changes were needed.  Please see the problem list section below in epic for further details.   Physical exam:   General: Well Developed, well nourished, and in no acute distress.  Neuro: Alert and oriented x3.  HEENT: Normocephalic, atraumatic.  Skin: Warm and dry. Irregular brown lesion to the right face, raised, soft without scaling, erythema, drainage, or tenderness. 0.5cm round, raised, hard lesion to the dorsal left forearm, slightly scaled on top without erythema, fluctuance, drainage, or tenderness.  Cardiac: Regular rate and rhythm.  Respiratory: Not using accessory muscles, speaking in full sentences.  Impression and Recommendations:    1. Severe major depression ([place]) Unfortunately, Daniece is still struggling with her depression symptoms. Continue Wellbutrin 300mg  daily. Recommend continuing with the plan to discuss her medications with the Psychiatrist at [facility]. Continue counseling as scheduled.   2. Insomnia, unspecified type Feel that some of her insomnia is related to anxiety but poor sleep is also detrimental to her mood. Her overall pain and discomfort is also contributing to her insomnia. She already has Temazepam available to use at night. Hesitant to add in other sedating medications at this point since she has plans to discuss her medications with Psychiatry. Will wait to see what changes are made. Discussed sleep hygiene measures.   3. Primary hypertension BP at goal today. Continue Lisinopril-HCTZ 10-12.5mg  daily as prescribed.  - lisinopril-hydrochlorothiazide (ZESTORETIC) 10-12.5  MG tablet; Take 1 tablet by mouth daily.  Dispense: 90 tablet; Refill: 3  4. Polyarthritis Previously seen by rheumatology prior to her relocation and has not established with a new rheumatologist here.  Referring today.  Unfortunately, she has had a  long history of chronic pain and has underwent multiple efforts to control her symptoms.  She does not want to be on addictive medications such as opioids.  Discussed other options that may be beneficial for pain control in her case.  After discussion, she is agreeable to a referral to the pain clinic. - Ambulatory referral to Rheumatology - Ambulatory referral to Pain Clinic  5. Bilateral hand pain Hand pain likely due to arthritis however most recent onset of hand pain at the base of the thumbs is likely first [facility] joint arthritis.  With the severity of her symptoms, recommend she follow-up with Dr. Darene Lamer at her earliest convenience for potential injections into the area of concern. - DG Hand Complete Right; Future - DG Hand Complete Left; Future  6. Vitamin D deficiency Refilling vitamin D. - Vitamin D, Ergocalciferol, (DRISDOL) 1.25 MG (50000 UNIT) CAPS capsule; Take 1 capsule (50,000 Units total) by mouth once a week.  Dispense: 12 capsule; Refill: 1  7. Skin lesion Evaluated the skin lesions of concern.  Low suspicion for malignancy for the facial lesion.  The forearm lesion is less clear.  Recommend scheduling at her convenience for lesion removal to send for biopsy.  Return for hand pain follow up with Dr. Darene Lamer; skin lesion removal at your convenience. ___________________________________________ Clearnce Sorrel, DNP, APRN, FNP-BC Primary Care and [place]

## 2021-12-21 ENCOUNTER — Inpatient Hospital Stay: Payer: Medicare Other | Attending: Hematology & Oncology

## 2021-12-21 ENCOUNTER — Inpatient Hospital Stay (HOSPITAL_BASED_OUTPATIENT_CLINIC_OR_DEPARTMENT_OTHER): Payer: Medicare Other | Admitting: Family

## 2021-12-21 ENCOUNTER — Ambulatory Visit (INDEPENDENT_AMBULATORY_CARE_PROVIDER_SITE_OTHER): Payer: Medicare Other | Admitting: Sports Medicine

## 2021-12-21 ENCOUNTER — Ambulatory Visit (INDEPENDENT_AMBULATORY_CARE_PROVIDER_SITE_OTHER): Payer: Medicare Other

## 2021-12-21 ENCOUNTER — Encounter: Payer: Self-pay | Admitting: Family

## 2021-12-21 ENCOUNTER — Inpatient Hospital Stay: Payer: Medicare Other

## 2021-12-21 ENCOUNTER — Other Ambulatory Visit: Payer: Self-pay

## 2021-12-21 VITALS — BP 147/96 | HR 66 | Temp 98.6°F | Resp 18 | Ht 64.0 in | Wt 163.5 lb

## 2021-12-21 DIAGNOSIS — D509 Iron deficiency anemia, unspecified: Secondary | ICD-10-CM | POA: Diagnosis not present

## 2021-12-21 DIAGNOSIS — Z452 Encounter for adjustment and management of vascular access device: Secondary | ICD-10-CM | POA: Insufficient documentation

## 2021-12-21 DIAGNOSIS — Z95828 Presence of other vascular implants and grafts: Secondary | ICD-10-CM | POA: Diagnosis not present

## 2021-12-21 DIAGNOSIS — D508 Other iron deficiency anemias: Secondary | ICD-10-CM | POA: Diagnosis present

## 2021-12-21 DIAGNOSIS — M130 Polyarthritis, unspecified: Secondary | ICD-10-CM

## 2021-12-21 DIAGNOSIS — M19041 Primary osteoarthritis, right hand: Secondary | ICD-10-CM

## 2021-12-21 DIAGNOSIS — K909 Intestinal malabsorption, unspecified: Secondary | ICD-10-CM | POA: Diagnosis present

## 2021-12-21 DIAGNOSIS — G629 Polyneuropathy, unspecified: Secondary | ICD-10-CM | POA: Insufficient documentation

## 2021-12-21 DIAGNOSIS — M25561 Pain in right knee: Secondary | ICD-10-CM

## 2021-12-21 DIAGNOSIS — Z9884 Bariatric surgery status: Secondary | ICD-10-CM | POA: Diagnosis not present

## 2021-12-21 DIAGNOSIS — M170 Bilateral primary osteoarthritis of knee: Secondary | ICD-10-CM

## 2021-12-21 DIAGNOSIS — Z79899 Other long term (current) drug therapy: Secondary | ICD-10-CM | POA: Diagnosis not present

## 2021-12-21 DIAGNOSIS — M19042 Primary osteoarthritis, left hand: Secondary | ICD-10-CM

## 2021-12-21 DIAGNOSIS — M25562 Pain in left knee: Secondary | ICD-10-CM

## 2021-12-21 DIAGNOSIS — M180 Bilateral primary osteoarthritis of first carpometacarpal joints: Secondary | ICD-10-CM | POA: Insufficient documentation

## 2021-12-21 HISTORY — DX: Bilateral primary osteoarthritis of knee: M17.0

## 2021-12-21 LAB — CBC WITH DIFFERENTIAL (CANCER CENTER ONLY)
Abs Immature Granulocytes: 0.03 10*3/uL (ref 0.00–0.07)
Basophils Absolute: 0 10*3/uL (ref 0.0–0.1)
Basophils Relative: 1 %
Eosinophils Absolute: 0.2 10*3/uL (ref 0.0–0.5)
Eosinophils Relative: 5 %
HCT: 36.6 % (ref 36.0–46.0)
Hemoglobin: 11.8 g/dL — ABNORMAL LOW (ref 12.0–15.0)
Immature Granulocytes: 1 %
Lymphocytes Relative: 35 %
Lymphs Abs: 1.4 10*3/uL (ref 0.7–4.0)
MCH: 30.4 pg (ref 26.0–34.0)
MCHC: 32.2 g/dL (ref 30.0–36.0)
MCV: 94.3 fL (ref 80.0–100.0)
Monocytes Absolute: 0.3 10*3/uL (ref 0.1–1.0)
Monocytes Relative: 7 %
Neutro Abs: 2 10*3/uL (ref 1.7–7.7)
Neutrophils Relative %: 51 %
Platelet Count: 197 10*3/uL (ref 150–400)
RBC: 3.88 MIL/uL (ref 3.87–5.11)
RDW: 12.2 % (ref 11.5–15.5)
WBC Count: 3.9 10*3/uL — ABNORMAL LOW (ref 4.0–10.5)
nRBC: 0 % (ref 0.0–0.2)

## 2021-12-21 LAB — RETICULOCYTES
Immature Retic Fract: 6.4 % (ref 2.3–15.9)
RBC.: 3.86 MIL/uL — ABNORMAL LOW (ref 3.87–5.11)
Retic Count, Absolute: 47.9 10*3/uL (ref 19.0–186.0)
Retic Ct Pct: 1.2 % (ref 0.4–3.1)

## 2021-12-21 LAB — IRON AND IRON BINDING CAPACITY (CC-WL,HP ONLY)
Iron: 94 ug/dL (ref 28–170)
Saturation Ratios: 25 % (ref 10.4–31.8)
TIBC: 379 ug/dL (ref 250–450)
UIBC: 285 ug/dL (ref 148–442)

## 2021-12-21 LAB — FERRITIN: Ferritin: 235 ng/mL (ref 11–307)

## 2021-12-21 MED ORDER — SODIUM CHLORIDE 0.9% FLUSH
10.0000 mL | INTRAVENOUS | Status: DC | PRN
  Administered 2021-12-21: 10:00:00 10 mL

## 2021-12-21 MED ORDER — CELECOXIB 200 MG PO CAPS: ORAL_CAPSULE | ORAL | 2 refills | Status: DC

## 2021-12-21 MED ORDER — HEPARIN SOD (PORK) LOCK FLUSH 100 UNIT/ML IV SOLN
500.0000 [IU] | Freq: Once | INTRAVENOUS | Status: AC | PRN
  Administered 2021-12-21: 10:00:00 500 [IU]

## 2021-12-21 NOTE — Assessment & Plan Note (Signed)
Polyarthralgia, strong family history of rheumatoid arthritis, elevated ESR multiple occasions, has not had official rheumatoid testing, will pull the trigger today.

## 2021-12-21 NOTE — Progress Notes (Signed)
°Hematology and Oncology Follow Up Visit ° °Heather Thompson °[id number] °08/12/1951 70 y.o. 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 Dietzman is here today for follow-up. She is having a hard time with increase pain all over due to significant arthritis and fibromyalgia.  °She has an appointment later today with her orthopedist for injections in both hands.  °She notes fatigue.  °No blood loss noted. No bruising or petechiae.  °No fever, chills, n/v, cough, rash, dizziness, SOB, chest pain, palpitations, abdominal pain or changes in bowel or bladder habits.  °Neuropathy in her hands and feet is unchanged from baseline.  °No falls or syncope to report.  °She has not appetite but is still eating. She is doing her best to stay well hydrated. Her weight is stable at 163 lbs.  ° °ECOG Performance Status: 1 - Symptomatic but completely ambulatory ° °Medications:  °Allergies as of 12/21/2021   ° °   Reactions  ° Compazine [prochlorperazine] Anaphylaxis  ° Phenothiazines Anaphylaxis, Other (See Comments)  ° Caused her throat to close  ° Other Other (See Comments)  ° Compazine.   ° Pregabalin Swelling, Other (See Comments)  ° Swelling and itching of hands and feet.   ° Prednisone Rash, Other (See Comments)  ° Other reaction(s): Unknown (comments)  ° °  ° °  °Medication List  °  ° °  ° Accurate as of December 21, 2021 10:01 AM. If you have any questions, ask your nurse or doctor.  °  °  ° °  ° °buPROPion 300 MG 24 hr tablet °Commonly known as: WELLBUTRIN XL °Take 1 tablet (300 mg total) by mouth daily. °  °Cyanocobalamin 1000 MCG/ML Kit °Inject as directed. °  °desvenlafaxine 100 MG 24 hr tablet °Commonly known as: PRISTIQ °Take 1 tablet (100 mg total) by mouth daily. °  °gabapentin 100 MG capsule °Commonly known as: NEURONTIN °Take 100 mg by mouth. One tablet by mouth once daily at lunchtime °   °lisinopril-hydrochlorothiazide 10-12.5 MG tablet °Commonly known as: ZESTORETIC °Take 1 tablet by mouth daily. °  °Magnesium 250 MG Tabs °Take 1 tablet by mouth daily. °  °meloxicam 7.5 MG tablet °Commonly known as: MOBIC °Take 1 tablet (7.5 mg total) by mouth 2 (two) times daily. °  °Multi-Vitamin tablet °Take 1 tablet by mouth daily. °  °nystatin powder °Commonly known as: MYCOSTATIN/NYSTOP °Apply 1 application topically 2 (two) times daily. °  °propranolol 20 MG tablet °Commonly known as: INDERAL °Take 1 tablet (20 mg total) by mouth daily as needed. °  °temazepam 30 MG capsule °Commonly known as: RESTORIL °Take 1 capsule (30 mg total) by mouth daily as needed. °  °Vitamin D (Ergocalciferol) 1.25 MG (50000 UNIT) Caps capsule °Commonly known as: DRISDOL °Take 1 capsule (50,000 Units total) by mouth once a week. °  ° °  ° ° °Allergies:  °Allergies  °Allergen Reactions  ° Compazine [Prochlorperazine] Anaphylaxis  ° Phenothiazines Anaphylaxis and Other (See Comments)  °  Caused her throat to close ° °  ° Other Other (See Comments)  °  Compazine.  ° °  ° Pregabalin Swelling and Other (See Comments)  °  Swelling and itching of hands and feet.  ° °  ° Prednisone Rash and Other (See Comments)  °  Other reaction(s): Unknown (comments) ° °  ° ° °Past Medical History, Surgical history, Social history, and   °Hematology and Oncology Follow Up Visit ° °Heather Thompson °[id number] °08/12/1951 70 y.o. 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 Dietzman is here today for follow-up. She is having a hard time with increase pain all over due to significant arthritis and fibromyalgia.  °She has an appointment later today with her orthopedist for injections in both hands.  °She notes fatigue.  °No blood loss noted. No bruising or petechiae.  °No fever, chills, n/v, cough, rash, dizziness, SOB, chest pain, palpitations, abdominal pain or changes in bowel or bladder habits.  °Neuropathy in her hands and feet is unchanged from baseline.  °No falls or syncope to report.  °She has not appetite but is still eating. She is doing her best to stay well hydrated. Her weight is stable at 163 lbs.  ° °ECOG Performance Status: 1 - Symptomatic but completely ambulatory ° °Medications:  °Allergies as of 12/21/2021   ° °   Reactions  ° Compazine [prochlorperazine] Anaphylaxis  ° Phenothiazines Anaphylaxis, Other (See Comments)  ° Caused her throat to close  ° Other Other (See Comments)  ° Compazine.   ° Pregabalin Swelling, Other (See Comments)  ° Swelling and itching of hands and feet.   ° Prednisone Rash, Other (See Comments)  ° Other reaction(s): Unknown (comments)  ° °  ° °  °Medication List  °  ° °  ° Accurate as of December 21, 2021 10:01 AM. If you have any questions, ask your nurse or doctor.  °  °  ° °  ° °buPROPion 300 MG 24 hr tablet °Commonly known as: WELLBUTRIN XL °Take 1 tablet (300 mg total) by mouth daily. °  °Cyanocobalamin 1000 MCG/ML Kit °Inject as directed. °  °desvenlafaxine 100 MG 24 hr tablet °Commonly known as: PRISTIQ °Take 1 tablet (100 mg total) by mouth daily. °  °gabapentin 100 MG capsule °Commonly known as: NEURONTIN °Take 100 mg by mouth. One tablet by mouth once daily at lunchtime °   °lisinopril-hydrochlorothiazide 10-12.5 MG tablet °Commonly known as: ZESTORETIC °Take 1 tablet by mouth daily. °  °Magnesium 250 MG Tabs °Take 1 tablet by mouth daily. °  °meloxicam 7.5 MG tablet °Commonly known as: MOBIC °Take 1 tablet (7.5 mg total) by mouth 2 (two) times daily. °  °Multi-Vitamin tablet °Take 1 tablet by mouth daily. °  °nystatin powder °Commonly known as: MYCOSTATIN/NYSTOP °Apply 1 application topically 2 (two) times daily. °  °propranolol 20 MG tablet °Commonly known as: INDERAL °Take 1 tablet (20 mg total) by mouth daily as needed. °  °temazepam 30 MG capsule °Commonly known as: RESTORIL °Take 1 capsule (30 mg total) by mouth daily as needed. °  °Vitamin D (Ergocalciferol) 1.25 MG (50000 UNIT) Caps capsule °Commonly known as: DRISDOL °Take 1 capsule (50,000 Units total) by mouth once a week. °  ° °  ° ° °Allergies:  °Allergies  °Allergen Reactions  ° Compazine [Prochlorperazine] Anaphylaxis  ° Phenothiazines Anaphylaxis and Other (See Comments)  °  Caused her throat to close ° °  ° Other Other (See Comments)  °  Compazine.  ° °  ° Pregabalin Swelling and Other (See Comments)  °  Swelling and itching of hands and feet.  ° °  ° Prednisone Rash and Other (See Comments)  °  Other reaction(s): Unknown (comments) ° °  ° ° °Past Medical History, Surgical history, Social history, and

## 2021-12-21 NOTE — Assessment & Plan Note (Signed)
Lateral osteoarthritis bilaterally. Celebrex should help, adding home conditioning, x-rays, return to see me in a month, injection if no better.

## 2021-12-21 NOTE — Progress Notes (Signed)
Procedures performed today:    None.  Independent interpretation of notes and tests performed by another provider:   None.  Brief History, Exam, Impression, and Recommendations:    Polyarthritis Polyarthralgia, strong family history of rheumatoid arthritis, elevated ESR multiple occasions, has not had official rheumatoid testing, will pull the trigger today.  Primary osteoarthritis of both hands He has pain in both hands, radial side, both at the carpometacarpal joint and the triscaphe joint. X-rays confirm the above. She did well with Celebrex in the past, she was switched to meloxicam, we will switch her back to Celebrex. In addition she saw nurse practitioner somewhere in [place] who put her on naltrexone for pain relief, I do not see this on her med list, I am not sure that is standard of care anywhere so we will go ahead and discontinue her naltrexone.  Adding home conditioning exercises as well, we can consider injection to the [facility] or the triscaphe joint at the follow-up visit depending on which joint declares itself as more painful.  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 Celebrex should help, adding home conditioning, x-rays, return to see me in a month, injection if no better.    ___________________________________________ Gwen Her. Dianah Field, M.D., ABFM., CAQSM. Primary Care and [place] Instructor of [place] [employer] [facility] [employer]

## 2021-12-21 NOTE — Patient Instructions (Signed)

## 2021-12-21 NOTE — Assessment & Plan Note (Addendum)
He has pain in both hands, radial side, both at the carpometacarpal joint and the triscaphe joint. X-rays confirm the above. She did well with Celebrex in the past, she was switched to meloxicam, we will switch her back to Celebrex. In addition she saw nurse practitioner somewhere in [place] who put her on naltrexone for pain relief, I do not see this on her med list, I am not sure that is standard of care anywhere so we will go ahead and discontinue her naltrexone.  Adding home conditioning exercises as well, we can consider injection to the [facility] or the triscaphe joint at the follow-up visit depending on which joint declares itself as more painful.

## 2021-12-25 LAB — RHEUMATOID FACTOR (IGA, IGG, IGM)
Rheumatoid Factor (IgA): <5 U (ref <=6)
Rheumatoid Factor (IgG): <5 U (ref <=6)
Rheumatoid Factor (IgM): <5 U (ref <=6)

## 2021-12-25 LAB — LUPUS(12) PANEL
Anti Nuclear Antibody (ANA): NEGATIVE
C3 Complement: 106 mg/dL (ref 83–193)
C4 Complement: 21 mg/dL (ref 15–57)
ENA SM Ab Ser-aCnc: <1 AI
Rheumatoid fact SerPl-aCnc: <14 IU/mL (ref <14)
Ribosomal P Protein Ab: <1 AI
SM/RNP: <1 AI
SSA (Ro) (ENA) Antibody, IgG: <1 AI
SSB (La) (ENA) Antibody, IgG: <1 AI
Scleroderma (Scl-70) (ENA) Antibody, IgG: <1 AI
Thyroperoxidase Ab SerPl-aCnc: 1 IU/mL (ref <9)
ds DNA Ab: 1 IU/mL

## 2021-12-25 LAB — SEDIMENTATION RATE: Sed Rate: 14 mm/h (ref 0–30)

## 2021-12-25 LAB — CK: Total CK: 60 U/L (ref 29–143)

## 2021-12-25 LAB — URIC ACID: Uric Acid, Serum: 4.1 mg/dL (ref 2.5–7.0)

## 2021-12-25 LAB — CYCLIC CITRUL PEPTIDE ANTIBODY, IGG: Cyclic Citrullin Peptide Ab: <16 UNITS

## 2021-12-27 ENCOUNTER — Other Ambulatory Visit: Payer: Self-pay

## 2021-12-27 ENCOUNTER — Ambulatory Visit (INDEPENDENT_AMBULATORY_CARE_PROVIDER_SITE_OTHER): Payer: Medicare Other | Admitting: Medical-Surgical

## 2021-12-27 VITALS — BP 124/73 | HR 62

## 2021-12-27 DIAGNOSIS — E538 Deficiency of other specified B group vitamins: Secondary | ICD-10-CM | POA: Diagnosis not present

## 2021-12-27 MED ORDER — CYANOCOBALAMIN 1000 MCG/ML IJ SOLN
1000.0000 ug | Freq: Once | INTRAMUSCULAR | Status: AC
  Administered 2021-12-27: 1000 ug via INTRAMUSCULAR

## 2021-12-27 NOTE — Progress Notes (Signed)
Established Patient Office Visit  Subjective:  Patient ID: Heather Thompson, female    DOB: 1951/03/09  Age: 71 y.o. MRN: [id number]  CC:  Chief Complaint  Patient presents with   B12 Deficiency    HPI [name] presents for Vitamin B12 injection. Denies muscle cramps, weakness or irregular heart rate.  Past Medical History:  Diagnosis Date   Allergy    Anemia    Clotting disorder (HCC)    Depression    Fibromyalgia    Hypertension    Hypothyroidism    Muscle disorder    Salzmann nodular degeneration     Past Surgical History:  Procedure Laterality Date   ABDOMINAL HYSTERECTOMY     APPENDECTOMY     CHOLECYSTECTOMY     JOINT REPLACEMENT      Family History  Problem Relation Age of Onset   Hypertension Mother    Heart attack Mother    Lung cancer Father    Prostate cancer Father    Kidney cancer Brother     Social History   Socioeconomic History   Marital status: Widowed    Spouse name: Not on file   Number of children: Not on file   Years of education: Not on file   Highest education level: Not on file  Occupational History   Not on file  Tobacco Use   Smoking status: Never   Smokeless tobacco: Never  Vaping Use   Vaping Use: Never used  Substance and Sexual Activity   Alcohol use: Yes    Comment: rarely   Drug use: Never   Sexual activity: Not Currently  Other Topics Concern   Not on file  Social History Narrative   Not on file   Social Determinants of Health   Financial Resource Strain: Not on file  Food Insecurity: Not on file  Transportation Needs: Not on file  Physical Activity: Not on file  Stress: Not on file  Social Connections: Not on file  Intimate Partner Violence: Not on file    Outpatient Medications Prior to Visit  Medication Sig Dispense Refill   buPROPion (WELLBUTRIN XL) 300 MG 24 hr tablet Take 1 tablet (300 mg total) by mouth daily. 90 tablet 1   celecoxib (CELEBREX) 200 MG capsule One to 2 tablets by mouth daily as  needed for pain. 60 capsule 2   Cyanocobalamin 1000 MCG/ML KIT Inject as directed.     desvenlafaxine (PRISTIQ) 100 MG 24 hr tablet Take 1 tablet (100 mg total) by mouth daily. 90 tablet 1   gabapentin (NEURONTIN) 100 MG capsule Take 100 mg by mouth. One tablet by mouth once daily at lunchtime     lisinopril-hydrochlorothiazide (ZESTORETIC) 10-12.5 MG tablet Take 1 tablet by mouth daily. 90 tablet 3   Magnesium 250 MG TABS Take 1 tablet by mouth daily.     Multiple Vitamin (MULTI-VITAMIN) tablet Take 1 tablet by mouth daily.     nystatin (MYCOSTATIN/NYSTOP) powder Apply 1 application topically 2 (two) times daily. 15 g 2   propranolol (INDERAL) 20 MG tablet Take 1 tablet (20 mg total) by mouth daily as needed. 90 tablet 0   temazepam (RESTORIL) 30 MG capsule Take 1 capsule (30 mg total) by mouth daily as needed. 30 capsule 3   Vitamin D, Ergocalciferol, (DRISDOL) 1.25 MG (50000 UNIT) CAPS capsule Take 1 capsule (50,000 Units total) by mouth once a week. 12 capsule 1   No facility-administered medications prior to visit.    Allergies  Allergen Reactions  Compazine [Prochlorperazine] Anaphylaxis   Phenothiazines Anaphylaxis and Swelling    Caused her throat to close     Pregabalin Itching and Swelling    Swelling and itching of hands and feet.      Prednisone Rash          ROS Review of Systems    Objective:    Physical Exam  BP 124/73 (BP Location: Right Arm, Patient Position: Sitting, Cuff Size: Normal)    Pulse 62    SpO2 99%  Wt Readings from Last 3 Encounters:  12/21/21 163 lb 8 oz (74.2 kg)  12/20/21 166 lb 9.6 oz (75.6 kg)  11/25/21 155 lb (70.3 kg)     Health Maintenance Due  Topic Date Due   Hepatitis C Screening  Never done   Zoster Vaccines- Shingrix (1 of 2) Never done   COVID-19 Vaccine (4 - Booster for Pfizer series) 02/04/2021    There are no preventive care reminders to display for this patient.  No results found for: TSH Lab Results   Component Value Date   WBC 3.9 (L) 12/21/2021   HGB 11.8 (L) 12/21/2021   HCT 36.6 12/21/2021   MCV 94.3 12/21/2021   PLT 197 12/21/2021   Lab Results  Component Value Date   NA 135 09/07/2021   K 3.7 09/07/2021   CO2 30 09/07/2021   GLUCOSE 106 (H) 09/07/2021   BUN 15 09/07/2021   CREATININE 0.70 09/07/2021   BILITOT 0.3 09/07/2021   ALKPHOS 85 09/07/2021   AST 30 09/07/2021   ALT 31 09/07/2021   PROT 6.6 09/07/2021   ALBUMIN 4.1 09/07/2021   CALCIUM 9.5 09/07/2021   ANIONGAP 7 09/07/2021   No results found for: CHOL No results found for: HDL No results found for: LDLCALC No results found for: TRIG No results found for: CHOLHDL No results found for: ZOXW9U    Assessment & Plan:  B12 Deficiency - patient tolerated injection well with out any complications. Follow up in 4 weeks. Problem List Items Addressed This Visit       Other   Vitamin B12 deficiency - Primary    Meds ordered this encounter  Medications   cyanocobalamin ((VITAMIN B-12)) injection 1,000 mcg    Follow-up: Return in about 4 weeks (around 01/24/2022) for B12 injection.    Latanya Presser, CMA

## 2021-12-27 NOTE — Progress Notes (Signed)
Agree with documentation as above.  ? ?___________________________________________ ?Deliah Strehlow L. Shiron Whetsel, DNP, APRN, FNP-BC ?Primary Care and Sports Medicine ?[facility] ? ?

## 2022-01-02 ENCOUNTER — Ambulatory Visit (INDEPENDENT_AMBULATORY_CARE_PROVIDER_SITE_OTHER): Payer: Medicare Other | Admitting: Licensed Clinical Social Worker

## 2022-01-02 DIAGNOSIS — F332 Major depressive disorder, recurrent severe without psychotic features: Secondary | ICD-10-CM | POA: Diagnosis not present

## 2022-01-02 DIAGNOSIS — F411 Generalized anxiety disorder: Secondary | ICD-10-CM | POA: Diagnosis not present
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
## 2022-01-03 ENCOUNTER — Ambulatory Visit (INDEPENDENT_AMBULATORY_CARE_PROVIDER_SITE_OTHER): Payer: Medicare Other

## 2022-01-03 ENCOUNTER — Ambulatory Visit (INDEPENDENT_AMBULATORY_CARE_PROVIDER_SITE_OTHER): Payer: Medicare Other | Admitting: Sports Medicine

## 2022-01-03 ENCOUNTER — Other Ambulatory Visit: Payer: Self-pay

## 2022-01-03 DIAGNOSIS — M19042 Primary osteoarthritis, left hand: Secondary | ICD-10-CM

## 2022-01-03 DIAGNOSIS — M19041 Primary osteoarthritis, right hand: Secondary | ICD-10-CM

## 2022-01-03 NOTE — Progress Notes (Signed)
° ° °  Procedures performed today:    Procedure: Real-time Ultrasound Guided injection of the left first [facility] Device: Samsung HS60  Verbal informed consent obtained.  Time-out conducted.  Noted no overlying erythema, induration, or other signs of local infection.  Skin prepped in a sterile fashion.  Local anesthesia: Topical Ethyl chloride.  With sterile technique and under real time ultrasound guidance: 1/2 cc lidocaine, 1/2 cc Kenalog 40 into noted arthritic joint. Completed without difficulty  Advised to call if fevers/chills, erythema, induration, drainage, or persistent bleeding.  Images permanently stored and available for review in PACS.  Impression: Technically successful ultrasound guided injection.  Procedure: Real-time Ultrasound Guided injection of the right first [facility] Device: Samsung HS60  Verbal informed consent obtained.  Time-out conducted.  Noted no overlying erythema, induration, or other signs of local infection.  Skin prepped in a sterile fashion.  Local anesthesia: Topical Ethyl chloride.  With sterile technique and under real time ultrasound guidance: Noted arthritic joint, 1/2 cc lidocaine, 1/2 cc Kenalog 40 injected easily. Completed without difficulty  Advised to call if fevers/chills, erythema, induration, drainage, or persistent bleeding.  Images permanently stored and available for review in PACS.  Impression: Technically successful ultrasound guided injection.  Independent interpretation of notes and tests performed by another provider:   None.  Brief History, Exam, Impression, and Recommendations:    Primary osteoarthritis of both hands Pleasant 71 year old female, bilateral hand osteoarthritis, today it seems to have declared itself to the carpometacarpal joint. X-rays confirmed the above. Celebrex, meloxicam not efficacious. Rheumatoid work-up negative. Today we did injections into the [facility] joints bilaterally. She was also having some discomfort as  well as paresthesias into the hand and fingertips suspicious for carpal tunnel syndrome so if she still having symptoms at the follow-up visit we will consider bilateral median nerve hydrodissection's.    ___________________________________________ Ihor Austin. Benjamin Stain, M.D., ABFM., CAQSM. Primary Care and Sports Medicine [facility]  Adjunct Instructor of Family Medicine  [employer]

## 2022-01-03 NOTE — Assessment & Plan Note (Signed)
Pleasant 71 year old female, bilateral hand osteoarthritis, today it seems to have declared itself to the carpometacarpal joint. X-rays confirmed the above. Celebrex, meloxicam not efficacious. Rheumatoid work-up negative. Today we did injections into the [facility] joints bilaterally. She was also having some discomfort as well as paresthesias into the hand and fingertips suspicious for carpal tunnel syndrome so if she still having symptoms at the follow-up visit we will consider bilateral median nerve hydrodissection's.

## 2022-01-09 ENCOUNTER — Ambulatory Visit (INDEPENDENT_AMBULATORY_CARE_PROVIDER_SITE_OTHER): Payer: Medicare Other | Admitting: Medical-Surgical

## 2022-01-09 ENCOUNTER — Other Ambulatory Visit: Payer: Self-pay

## 2022-01-09 VITALS — BP 118/68 | HR 71 | Ht 64.5 in | Wt 165.0 lb

## 2022-01-09 DIAGNOSIS — Z0000 Encounter for general adult medical examination without abnormal findings: Secondary | ICD-10-CM | POA: Diagnosis not present

## 2022-01-09 NOTE — Patient Instructions (Addendum)
MEDICARE ANNUAL WELLNESS VISIT Health Maintenance Summary and Written Plan of Care  Heather Thompson ,  Thank you for allowing me to perform your Medicare Annual Wellness Visit and for your ongoing commitment to your health.   Health Maintenance & Immunization History Health Maintenance  Topic Date Due   COVID-19 Vaccine (4 - Booster for Pfizer series) 01/25/2022 (Originally 02/04/2021)   Zoster Vaccines- Shingrix (1 of 2) 04/08/2022 (Originally [birth year]/28/1969)   TETANUS/TDAP  11/08/2022 (Originally 06/10/2019)   Hepatitis C Screening  01/09/2023 (Originally 05/28/1969)   MAMMOGRAM  04/27/2023   COLONOSCOPY (Pts 45-51yrs Insurance coverage will need to be confirmed)  07/14/2026   Pneumonia Vaccine 5+ Years old  Completed   INFLUENZA VACCINE  Completed   DEXA SCAN  Completed   HPV VACCINES  Aged Out   Immunization History  Administered Date(s) Administered   Fluad Quad(high Dose 65+) 07/29/2021   H1N1 12/04/2008   Influenza Split 08/22/2012   Influenza Whole 10/04/2007, 09/03/2008, 09/07/2009   Influenza, High Dose Seasonal PF 08/13/2015, 08/25/2020   Influenza,inj,Quad PF,6+ Mos 08/29/2016   Influenza-Unspecified 08/31/2017, 09/03/2018, 09/04/2019   PFIZER(Purple Top)SARS-COV-2 Vaccination 01/24/2020, 02/21/2020, 12/10/2020   PPD Test 06/12/2007, 10/17/2012, 10/28/2012, 08/29/2013, 09/08/2013   Pneumococcal Conjugate-13 09/13/2015   Pneumococcal Polysaccharide-23 10/04/2007, 12/01/2011, 02/21/2017   Tdap 11/28/1996, 06/09/2009   Zoster, Live 10/06/2013    These are the patient goals that we discussed:  Goals Addressed               This Visit's Progress     Patient Stated (pt-stated)        01/09/2022 AWV Goal: Exercise for General Health  Patient will verbalize understanding of the benefits of increased physical activity: Exercising regularly is important. It will improve your overall fitness, flexibility, and endurance. Regular exercise also will improve your overall health.  It can help you control your weight, reduce stress, and improve your bone density. Over the next year, patient will increase physical activity as tolerated with a goal of at least 150 minutes of moderate physical activity per week.  You can tell that you are exercising at a moderate intensity if your heart starts beating faster and you start breathing faster but can still hold a conversation. Moderate-intensity exercise ideas include: Walking 1 mile (1.6 km) in about 15 minutes Biking Hiking Golfing Dancing Water aerobics Patient will verbalize understanding of everyday activities that increase physical activity by providing examples like the following: Yard work, such as: Insurance underwriter Gardening Washing windows or floors Patient will be able to explain general safety guidelines for exercising:  Before you start a new exercise program, talk with your health care provider. Do not exercise so much that you hurt yourself, feel dizzy, or get very short of breath. Wear comfortable clothes and wear shoes with good support. Drink plenty of water while you exercise to prevent dehydration or heat stroke. Work out until your breathing and your heartbeat get faster.          This is a list of Health Maintenance Items that are overdue or due now: Td vaccine Shingrix vaccine   Orders/Referrals Placed Today: No orders of the defined types were placed in this encounter.  (Contact our referral department at [phone] if you have not spoken with someone about your referral appointment within the next 5 days)    Follow-up Plan Follow-up with Christen Butter, NP as planned Schedule your shingrix and tetanus  shot.  Medicare wellness visit in one year. AVS printed and given to the patient.     Health Maintenance, Female Adopting a healthy lifestyle and getting preventive care are important in promoting  health and wellness. Ask your health care provider about: The right schedule for you to have regular tests and exams. Things you can do on your own to prevent diseases and keep yourself healthy. What should I know about diet, weight, and exercise? Eat a healthy diet  Eat a diet that includes plenty of vegetables, fruits, low-fat dairy products, and lean protein. Do not eat a lot of foods that are high in solid fats, added sugars, or sodium. Maintain a healthy weight Body mass index (BMI) is used to identify weight problems. It estimates body fat based on height and weight. Your health care provider can help determine your BMI and help you achieve or maintain a healthy weight. Get regular exercise Get regular exercise. This is one of the most important things you can do for your health. Most adults should: Exercise for at least 150 minutes each week. The exercise should increase your heart rate and make you sweat (moderate-intensity exercise). Do strengthening exercises at least twice a week. This is in addition to the moderate-intensity exercise. Spend less time sitting. Even light physical activity can be beneficial. Watch cholesterol and blood lipids Have your blood tested for lipids and cholesterol at 72 years of age, then have this test every 5 years. Have your cholesterol levels checked more often if: Your lipid or cholesterol levels are high. You are older than 72 years of age. You are at high risk for heart disease. What should I know about cancer screening? Depending on your health history and family history, you may need to have cancer screening at various ages. This may include screening for: Breast cancer. Cervical cancer. Colorectal cancer. Skin cancer. Lung cancer. What should I know about heart disease, diabetes, and high blood pressure? Blood pressure and heart disease High blood pressure causes heart disease and increases the risk of stroke. This is more likely to  develop in people who have high blood pressure readings or are overweight. Have your blood pressure checked: Every 3-5 years if you are 87-34 years of age. Every year if you are 9 years old or older. Diabetes Have regular diabetes screenings. This checks your fasting blood sugar level. Have the screening done: Once every three years after age 73 if you are at a normal weight and have a low risk for diabetes. More often and at a younger age if you are overweight or have a high risk for diabetes. What should I know about preventing infection? Hepatitis B If you have a higher risk for hepatitis B, you should be screened for this virus. Talk with your health care provider to find out if you are at risk for hepatitis B infection. Hepatitis C Testing is recommended for: Everyone born from [birth year] through 1966. Anyone with known risk factors for hepatitis C. Sexually transmitted infections (STIs) Get screened for STIs, including gonorrhea and chlamydia, if: You are sexually active and are younger than 72 years of age. You are older than 72 years of age and your health care provider tells you that you are at risk for this type of infection. Your sexual activity has changed since you were last screened, and you are at increased risk for chlamydia or gonorrhea. Ask your health care provider if you are at risk. Ask your health care provider about whether  you are at high risk for HIV. Your health care provider may recommend a prescription medicine to help prevent HIV infection. If you choose to take medicine to prevent HIV, you should first get tested for HIV. You should then be tested every 3 months for as long as you are taking the medicine. Pregnancy If you are about to stop having your period (premenopausal) and you may become pregnant, seek counseling before you get pregnant. Take 400 to 800 micrograms (mcg) of folic acid every day if you become pregnant. Ask for birth control (contraception) if you  want to prevent pregnancy. Osteoporosis and menopause Osteoporosis is a disease in which the bones lose minerals and strength with aging. This can result in bone fractures. If you are 22 years old or older, or if you are at risk for osteoporosis and fractures, ask your health care provider if you should: Be screened for bone loss. Take a calcium or vitamin D supplement to lower your risk of fractures. Be given hormone replacement therapy (HRT) to treat symptoms of menopause. Follow these instructions at home: Alcohol use Do not drink alcohol if: Your health care provider tells you not to drink. You are pregnant, may be pregnant, or are planning to become pregnant. If you drink alcohol: Limit how much you have to: 0-1 drink a day. Know how much alcohol is in your drink. In the [place] one drink equals one 12 oz bottle of beer (355 mL), one 5 oz glass of wine (148 mL), or one 1 oz glass of hard liquor (44 mL). Lifestyle Do not use any products that contain nicotine or tobacco. These products include cigarettes, chewing tobacco, and vaping devices, such as e-cigarettes. If you need help quitting, ask your health care provider. Do not use street drugs. Do not share needles. Ask your health care provider for help if you need support or information about quitting drugs. General instructions Schedule regular health, dental, and eye exams. Stay current with your vaccines. Tell your health care provider if: You often feel depressed. You have ever been abused or do not feel safe at home. Summary Adopting a healthy lifestyle and getting preventive care are important in promoting health and wellness. Follow your health care provider's instructions about healthy diet, exercising, and getting tested or screened for diseases. Follow your health care provider's instructions on monitoring your cholesterol and blood pressure. This information is not intended to replace advice given to you by your health  care provider. Make sure you discuss any questions you have with your health care provider. Document Revised: 04/04/2021 Document Reviewed: 04/04/2021 Elsevier Patient Education  2022 [employer].

## 2022-01-09 NOTE — Progress Notes (Signed)
MEDICARE [place] VISIT  2/13/2023High   Subjective:  Heather Thompson is a 71 y.o. female patient of Samuel Bouche, NP who had a Medicare Annual Wellness Visit today. Otisha is Retired and lives alone. she has 2 children. she reports that she is socially active and does interact with friends/family regularly. she is minimally physically active and enjoys staying active, enjoys doing crafts and painting. .  Patient Care Team: Samuel Bouche, NP as PCP - General (Nurse Practitioner)  Advanced Directives 01/09/2022 12/21/2021 10/25/2021 09/07/2021 07/29/2021  Does Patient Have a Medical Advance Directive? Yes Yes Yes Yes Yes  Type of Paramedic of [place];Living will [place];Living will [place];Living will [place];Living will [place];Out of facility DNR (pink MOST or yellow form);Living will  Does patient want to make changes to medical advance directive? No - Patient declined No - Patient declined No - Patient declined - No - Guardian declined  Copy of [place] in Chart? No - copy requested No - copy requested No - copy requested No - copy requested [facility] Utilization Over the Past 12 Months: # of hospitalizations or ER visits: 0 # of surgeries: 0  Review of Systems    Patient reports that her overall health is unchanged when compared to last year.  Review of Systems: History obtained from chart review and the patient  All other systems negative.  Pain Assessment Pain : 0-10 Pain Score: 4  Pain Type: Chronic pain Pain Location: Generalized Pain Descriptors / Indicators: Aching Pain Onset: More than a month ago Pain Frequency: Constant Pain Relieving Factors: none  Pain Relieving Factors: none  Current Medications & Allergies (verified) Allergies as of 01/09/2022       Reactions   Compazine [prochlorperazine] Anaphylaxis   Phenothiazines  Anaphylaxis, Swelling   Caused her throat to close   Pregabalin Itching, Swelling   Swelling and itching of hands and feet.    Prednisone Rash           Medication List        Accurate as of January 09, 2022  9:54 AM. If you have any questions, ask your nurse or doctor.          buPROPion 300 MG 24 hr tablet Commonly known as: WELLBUTRIN XL Take 1 tablet (300 mg total) by mouth daily.   celecoxib 200 MG capsule Commonly known as: CeleBREX One to 2 tablets by mouth daily as needed for pain.   Cyanocobalamin 1000 MCG/ML Kit Inject as directed.   desvenlafaxine 100 MG 24 hr tablet Commonly known as: PRISTIQ Take 1 tablet (100 mg total) by mouth daily.   lisinopril-hydrochlorothiazide 10-12.5 MG tablet Commonly known as: ZESTORETIC Take 1 tablet by mouth daily.   Magnesium 250 MG Tabs Take 1 tablet by mouth daily.   Multi-Vitamin tablet Take 1 tablet by mouth daily.   nystatin powder Commonly known as: MYCOSTATIN/NYSTOP Apply 1 application topically 2 (two) times daily.   propranolol 20 MG tablet Commonly known as: INDERAL Take 1 tablet (20 mg total) by mouth daily as needed.   temazepam 30 MG capsule Commonly known as: RESTORIL Take 1 capsule (30 mg total) by mouth daily as needed.   Vitamin D (Ergocalciferol) 1.25 MG (50000 UNIT) Caps capsule Commonly known as: DRISDOL Take 1 capsule (50,000 Units total) by mouth once a week.        History (reviewed): Past Medical History:  Diagnosis Date   Allergy    Anemia    Clotting disorder ([place])    Depression    Fibromyalgia    Hypertension    Hypothyroidism    Muscle disorder    Salzmann nodular degeneration    Past Surgical History:  Procedure Laterality Date   ABDOMINAL HYSTERECTOMY     APPENDECTOMY     CHOLECYSTECTOMY     JOINT REPLACEMENT     Family History  Problem Relation Age of Onset   Hypertension Mother    Heart attack Mother    Lung cancer Father    Prostate cancer Father     Kidney cancer Brother    Social History   Socioeconomic History   Marital status: Widowed    Spouse name: Not on file   Number of children: 2   Years of education: 10   Highest education level: 12th grade  Occupational History    Comment: Retired.  Tobacco Use   Smoking status: Never   Smokeless tobacco: Never  Vaping Use   Vaping Use: Never used  Substance and Sexual Activity   Alcohol use: Yes    Comment: rarely   Drug use: Never   Sexual activity: Not Currently  Other Topics Concern   Not on file  Social History Narrative   Lives alone. She has two children and 4 grandchildren. She stays active, enjoys doing crafts and painting.    Social Determinants of Health   Financial Resource Strain: Low Risk    Difficulty of Paying Living Expenses: Not hard at all  Food Insecurity: No Food Insecurity   Worried About Charity fundraiser in the Last Year: Never true   [place] in the Last Year: Never true  Transportation Needs: No Transportation Needs   Lack of Transportation (Medical): No   Lack of Transportation (Non-Medical): No  Physical Activity: Inactive   Days of Exercise per Week: 0 days   Minutes of Exercise per Session: 0 min  Stress: No Stress Concern Present   Feeling of Stress : Not at all  Social Connections: Moderately Integrated   Frequency of Communication with Friends and Family: More than three times a week   Frequency of Social Gatherings with Friends and Family: Three times a week   Attends Religious Services: More than 4 times per year   Active Member of Clubs or Organizations: Yes   Attends Archivist Meetings: More than 4 times per year   Marital Status: Widowed    Activities of Daily Living In your present state of health, do you have any difficulty performing the following activities: 01/09/2022 07/29/2021  Hearing? N N  Vision? 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 -   Difficulty concentrating or making decisions? Y Y  Comment has noticed memory loss such as misplacing things. -  Walking or climbing stairs? N N  Dressing or bathing? N N  Doing errands, shopping? N N  Preparing Food and eating ? N -  Using the Toilet? N -  In the past six months, have you accidently leaked urine? N -  Do you have problems with loss of bowel control? N -  Managing your Medications? N -  Managing your Finances? N -  Housekeeping or managing your Housekeeping? N -    Patient Education/Literacy How often do you need to have someone help you when you read instructions, pamphlets, or other written materials from  your doctor or pharmacy?: 1 - Never What is the last grade level you completed in school?: 12th grade.  Exercise Current Exercise Habits: The patient does not participate in regular exercise at present, Exercise limited by: orthopedic condition(s)  Diet Patient reports consuming 1 meals a day and 3 snack(s) a day Patient reports that her primary diet is: Regular Patient reports that she does have regular access to food.   Depression Screen PHQ 2/9 Scores 01/09/2022 12/20/2021 08/18/2021 07/29/2021  PHQ - 2 Score $Remov'1 3 4 2  'Qexfte$ PHQ- 9 Score $Remov'4 8 11 7  'hbYaVe$ Some encounter information is confidential and restricted. Go to Review Flowsheets activity to see all data.     Fall Risk Fall Risk  01/09/2022 12/20/2021 11/08/2021 07/29/2021  Falls in the past year? 0 0 0 0  Number falls in past yr: 0 0 0 -  Injury with Fall? 0 0 0 -  Risk for fall due to : No Fall Risks - - -  Follow up Falls evaluation completed Falls evaluation completed Falls evaluation completed Falls evaluation completed     Objective:   BP 118/68 (BP Location: Right Arm, Patient Position: Sitting, Cuff Size: Normal)    Pulse 71    Ht 5' 4.5" (1.638 m)    Wt 165 lb 0.6 oz (74.9 kg)    SpO2 98%    BMI 27.89 kg/m   Last Weight  Most recent update: 01/09/2022  9:02 AM    Weight  74.9 kg (165 lb 0.6 oz)              Body mass index is 27.89 kg/m.  Hearing/Vision  Darrielle did not have difficulty with hearing/understanding during the face-to-face interview Teniyah did not have difficulty with her vision during the face-to-face interview Reports that she has had a formal eye exam by an eye care professional within the past year Reports that she has not had a formal hearing evaluation within the past year  Cognitive Function: 6CIT Screen 01/09/2022  What Year? 0 points  What month? 0 points  What time? 0 points  Count back from 20 0 points  Months in reverse 0 points  Repeat phrase 0 points  Total Score 0    Normal Cognitive Function Screening: Yes (Normal:0-7, Significant for Dysfunction: >8)  Immunization & Health Maintenance Record Immunization History  Administered Date(s) Administered   Fluad Quad(high Dose 65+) 07/29/2021   H1N1 12/04/2008   Influenza Split 08/22/2012   Influenza Whole 10/04/2007, 09/03/2008, 09/07/2009   Influenza, High Dose Seasonal PF 08/13/2015, 08/25/2020   Influenza,inj,Quad PF,6+ Mos 08/29/2016   Influenza-Unspecified 08/31/2017, 09/03/2018, 09/04/2019   PFIZER(Purple Top)SARS-COV-2 Vaccination 01/24/2020, 02/21/2020, 12/10/2020   PPD Test 06/12/2007, 10/17/2012, 10/28/2012, 08/29/2013, 09/08/2013   Pneumococcal Conjugate-13 09/13/2015   Pneumococcal Polysaccharide-23 10/04/2007, 12/01/2011, 02/21/2017   Tdap 11/28/1996, 06/09/2009   Zoster, Live 10/06/2013    Health Maintenance  Topic Date Due   COVID-19 Vaccine (4 - Booster for [place] series) 01/25/2022 (Originally 02/04/2021)   Zoster Vaccines- Shingrix (1 of 2) 04/08/2022 (Originally 05/28/1969)   TETANUS/TDAP  11/08/2022 (Originally 06/10/2019)   Hepatitis C Screening  01/09/2023 (Originally 05/28/1968)   MAMMOGRAM  04/27/2023   COLONOSCOPY (Pts 45-10yrs Insurance coverage will need to be confirmed)  07/14/2026   Pneumonia Vaccine 31+ Years old  Completed   INFLUENZA VACCINE  Completed   DEXA SCAN   Completed   HPV VACCINES  Aged Out       Assessment  This is a routine wellness examination  for [employer].  Health Maintenance: Due or Overdue There are no preventive care reminders to display for this patient.   [name] Frison does not need a referral for Community Assistance: Care Management:   no Social Work:    no Prescription Assistance:  no Nutrition/Diabetes Education:  no   Plan:  Personalized Goals  Goals Addressed               This Visit's Progress     Patient Stated (pt-stated)        01/09/2022 AWV Goal: Exercise for General Health  Patient will verbalize understanding of the benefits of increased physical activity: Exercising regularly is important. It will improve your overall fitness, flexibility, and endurance. Regular exercise also will improve your overall health. It can help you control your weight, reduce stress, and improve your bone density. Over the next year, patient will increase physical activity as tolerated with a goal of at least 150 minutes of moderate physical activity per week.  You can tell that you are exercising at a moderate intensity if your heart starts beating faster and you start breathing faster but can still hold a conversation. Moderate-intensity exercise ideas include: Walking 1 mile (1.6 km) in about 15 minutes Biking Hiking Golfing Dancing Water aerobics Patient will verbalize understanding of everyday activities that increase physical activity by providing examples like the following: Yard work, such as: Sales promotion account executive Gardening Washing windows or floors Patient will be able to explain general safety guidelines for exercising:  Before you start a new exercise program, talk with your health care provider. Do not exercise so much that you hurt yourself, feel dizzy, or get very short of breath. Wear comfortable clothes and wear shoes  with good support. Drink plenty of water while you exercise to prevent dehydration or heat stroke. Work out until your breathing and your heartbeat get faster.        Personalized Health Maintenance & Screening Recommendations  Td vaccine Shingrix vaccine  Lung Cancer Screening Recommended: no (Low Dose CT Chest recommended if Age 58-80 years, 30 pack-year currently smoking OR have quit w/in past 15 years) Hepatitis C Screening recommended: yes HIV Screening recommended: no  Advanced Directives: Written information was not given per the patient's request.  Referrals & Orders No orders of the defined types were placed in this encounter.   Follow-up Plan Follow-up with Samuel Bouche, NP as planned Schedule your shingrix and tetanus shot.  Medicare wellness visit in one year. AVS printed and given to the patient.   I have personally reviewed and noted the following in the patients chart:   Medical and social history Use of alcohol, tobacco or illicit drugs  Current medications and supplements Functional ability and status Nutritional status Physical activity Advanced directives List of other physicians Hospitalizations, surgeries, and ER visits in previous 12 months Vitals Screenings to include cognitive, depression, and falls Referrals and appointments  In addition, I have reviewed and discussed with patient certain preventive protocols, quality metrics, and best practice recommendations. A written personalized care plan for preventive services as well as general preventive health recommendations were provided to patient.     Tinnie Gens, RN  01/09/2022

## 2022-01-18 ENCOUNTER — Ambulatory Visit: Payer: Medicare Other | Admitting: Sports Medicine

## 2022-01-20 ENCOUNTER — Ambulatory Visit (INDEPENDENT_AMBULATORY_CARE_PROVIDER_SITE_OTHER): Payer: Medicare Other | Admitting: Medical-Surgical

## 2022-01-20 ENCOUNTER — Encounter: Payer: Self-pay | Admitting: Medical-Surgical

## 2022-01-20 ENCOUNTER — Other Ambulatory Visit: Payer: Self-pay

## 2022-01-20 VITALS — BP 117/72 | HR 66 | Resp 20 | Ht 64.5 in | Wt 167.9 lb

## 2022-01-20 DIAGNOSIS — L820 Inflamed seborrheic keratosis: Secondary | ICD-10-CM

## 2022-01-20 DIAGNOSIS — E538 Deficiency of other specified B group vitamins: Secondary | ICD-10-CM | POA: Diagnosis not present

## 2022-01-20 DIAGNOSIS — L989 Disorder of the skin and subcutaneous tissue, unspecified: Secondary | ICD-10-CM

## 2022-01-20 MED ORDER — CYANOCOBALAMIN 1000 MCG/ML IJ SOLN
1000.0000 ug | Freq: Once | INTRAMUSCULAR | Status: AC
  Administered 2022-01-20: 1000 ug via INTRAMUSCULAR

## 2022-01-20 NOTE — Progress Notes (Signed)
°  HPI with pertinent ROS:   CC: Lesion removal  HPI: Pleasant 71 year old female presenting today for for removal of a skin lesion on the right side of her face just anterior to the right tragus.  The lesion has been present for several years but lately has gotten a bit larger and developed a "tag".  No redness, pain, swelling, or bleeding.  She would like to have it removed for cosmetic reasons as well as to make sure it is nothing malignant.  Will be due for her B12 shot next week but since she is here would like to go ahead and get her injection if this is okay.  I reviewed the past medical history, family history, social history, surgical history, and allergies today and no changes were needed.  Please see the problem list section below in epic for further details.   Physical exam:   General: Well Developed, well nourished, and in no acute distress.  Neuro: Alert and oriented x3.  HEENT: Normocephalic, atraumatic.  Skin: Warm and dry.  Irregular shaped light brown skin lesion to the right facial area just in front of the tragus. Cardiac: Regular rate and rhythm, no murmurs rubs or gallops, no lower extremity edema.  Respiratory: Clear to auscultation bilaterally. Not using accessory muscles, speaking in full sentences.  Impression and Recommendations:    1. Skin lesion After informed consent was obtained, using Betadine for cleansing and 1% Lidocaine without epinephrine for anesthetic, with sterile technique, shave excision was performed.  Hemostasis achieved with silver nitrate sticks.  Antibiotic dressing is applied, and wound care instructions provided.  Be alert for any signs of cutaneous infection. The procedure was well tolerated without complications. Follow up: the specimen is labeled and sent to pathology for evaluation, the patient may return prn.   2. B12 deficiency She is 1 week early however we will go ahead and administer her IM B12 injection today.  Plan to return in 1  month for her nurse visit for her next injection. - cyanocobalamin ((VITAMIN B-12)) injection 1,000 mcg  Return in about 1 month (around 02/17/2022) for vitamin b12 injection ([place]). ___________________________________________ Clearnce Sorrel, DNP, APRN, FNP-BC Primary Care and [place]

## 2022-01-20 NOTE — Addendum Note (Signed)
Addended by: Latanya Presser on: 01/20/2022 03:28 PM   Modules accepted: Orders

## 2022-01-21 ENCOUNTER — Encounter: Payer: Self-pay | Admitting: Medical-Surgical

## 2022-01-24 ENCOUNTER — Ambulatory Visit: Payer: Medicare Other

## 2022-01-30 ENCOUNTER — Other Ambulatory Visit: Payer: Self-pay

## 2022-01-30 ENCOUNTER — Ambulatory Visit (INDEPENDENT_AMBULATORY_CARE_PROVIDER_SITE_OTHER): Payer: Medicare Other | Admitting: Licensed Clinical Social Worker

## 2022-01-30 ENCOUNTER — Encounter (HOSPITAL_COMMUNITY): Payer: Self-pay

## 2022-01-30 DIAGNOSIS — F332 Major depressive disorder, recurrent severe without psychotic features: Secondary | ICD-10-CM

## 2022-01-30 DIAGNOSIS — F411 Generalized anxiety disorder: Secondary | ICD-10-CM | POA: Diagnosis not present

## 2022-01-30 NOTE — Plan of Care (Signed)
Patient actively participated in treatment plan ?
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
## 2022-01-31 ENCOUNTER — Ambulatory Visit (INDEPENDENT_AMBULATORY_CARE_PROVIDER_SITE_OTHER): Payer: Medicare Other | Admitting: Sports Medicine

## 2022-01-31 DIAGNOSIS — M170 Bilateral primary osteoarthritis of knee: Secondary | ICD-10-CM

## 2022-01-31 DIAGNOSIS — M19041 Primary osteoarthritis, right hand: Secondary | ICD-10-CM

## 2022-01-31 DIAGNOSIS — M19042 Primary osteoarthritis, left hand: Secondary | ICD-10-CM

## 2022-01-31 MED ORDER — TRAMADOL HCL 50 MG PO TABS: 50.0000 mg | ORAL_TABLET | Freq: Three times a day (TID) | ORAL | 0 refills | Status: DC | PRN

## 2022-01-31 NOTE — Assessment & Plan Note (Signed)
Heather Thompson is a pleasant 70 year old female, she has bilateral hand osteoarthritis, it has predominantly declared itself at the carpometacarpal joint, we injected both of her CMC joints at the last visit, unfortunately she has not noted significant relief. ?Celebrex and meloxicam have not been efficacious and a serum rheumatoid work-up was negative. 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 ?Adding some tramadol for pain relief in the meantime. ?

## 2022-01-31 NOTE — Assessment & Plan Note (Signed)
Unfortunately has not improved with Celebrex, adding tramadol, she would like to try tramadol prior to considering an injection into her knee. ?Return to see me in about a month, if insufficient relief we will inject her knee with steroid, if that fails we will try viscosupplementation. ?I am going to add a prescription for her to do some aquatic therapy at the [facility]. ?

## 2022-01-31 NOTE — Progress Notes (Signed)
? ? ?  Procedures performed today:   ? ?None. ? ?Independent interpretation of notes and tests performed by another provider:  ? ?None. ? ?Brief History, Exam, Impression, and Recommendations:   ? ?Primary osteoarthritis of both hands ?Heather Thompson is a pleasant 71 year old female, she has bilateral hand osteoarthritis, it has predominantly declared itself at the carpometacarpal joint, we injected both of her CMC joints at the last visit, unfortunately she has not noted significant relief. ?Celebrex and meloxicam have not been efficacious and a serum rheumatoid work-up was negative. 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 ?Adding some tramadol for pain relief in the meantime. ? ?Primary osteoarthritis of both knees ?Unfortunately has not improved with Celebrex, adding tramadol, she would like to try tramadol prior to considering an injection into her knee. ?Return to see me in about a month, if insufficient relief we will inject her knee with steroid, if that fails we will try viscosupplementation. ?I am going to add a prescription for her to do some aquatic therapy at the [facility]. ? ?Chronic process with exacerbation and pharmacologic intervention ? ?___________________________________________ ?Ihor Austin. Benjamin Stain, M.D., ABFM., CAQSM. ?Primary Care and Sports Medicine ?[facility] ? ?Adjunct Instructor of Family Medicine  ?[employer] ?

## 2022-02-02 ENCOUNTER — Encounter (HOSPITAL_COMMUNITY): Payer: Self-pay | Admitting: Psychiatry

## 2022-02-02 ENCOUNTER — Other Ambulatory Visit: Payer: Self-pay | Admitting: Medical-Surgical

## 2022-02-02 ENCOUNTER — Telehealth: Payer: Self-pay | Admitting: Medical-Surgical

## 2022-02-02 ENCOUNTER — Ambulatory Visit (INDEPENDENT_AMBULATORY_CARE_PROVIDER_SITE_OTHER): Payer: Medicare Other | Admitting: Psychiatry

## 2022-02-02 VITALS — BP 130/68 | Temp 98.0°F | Ht 64.0 in | Wt 169.0 lb

## 2022-02-02 DIAGNOSIS — F5102 Adjustment insomnia: Secondary | ICD-10-CM | POA: Diagnosis not present

## 2022-02-02 DIAGNOSIS — F411 Generalized anxiety disorder: Secondary | ICD-10-CM

## 2022-02-02 DIAGNOSIS — F332 Major depressive disorder, recurrent severe without psychotic features: Secondary | ICD-10-CM

## 2022-02-02 DIAGNOSIS — F4321 Adjustment disorder with depressed mood: Secondary | ICD-10-CM

## 2022-02-02 MED ORDER — HYDROXYZINE HCL 10 MG PO TABS: 10.0000 mg | ORAL_TABLET | Freq: Every evening | ORAL | 0 refills | Status: DC | PRN
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
## 2022-02-02 NOTE — Telephone Encounter (Signed)
LMVM for the patient to contact the office for lesion biopsy results. ?

## 2022-02-02 NOTE — Telephone Encounter (Signed)
Please contact patient to let her know the lesion biopsy that was done from the facial lesion that I removed has come back showing a benign seborrheic keratosis with no concerns for malignancy.  ? ?___________________________________________ ?Thayer Ohm, DNP, APRN, FNP-BC ?Primary Care and Sports Medicine ?[facility] ? ?

## 2022-02-03 NOTE — Telephone Encounter (Signed)
Spoke with patient and she is aware of her results.

## 2022-02-06 ENCOUNTER — Encounter (HOSPITAL_COMMUNITY): Payer: Self-pay

## 2022-02-06 ENCOUNTER — Ambulatory Visit (HOSPITAL_COMMUNITY): Payer: Medicare Other | Admitting: Licensed Clinical Social Worker

## 2022-02-13 ENCOUNTER — Ambulatory Visit (INDEPENDENT_AMBULATORY_CARE_PROVIDER_SITE_OTHER): Payer: Medicare Other | Admitting: Licensed Clinical Social Worker

## 2022-02-13 DIAGNOSIS — F411 Generalized anxiety disorder: Secondary | ICD-10-CM

## 2022-02-13 DIAGNOSIS — F332 Major depressive disorder, recurrent severe without psychotic features: Secondary | ICD-10-CM

## 2022-02-13 DIAGNOSIS — F4321 Adjustment disorder with depressed mood: Secondary | ICD-10-CM
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
## 2022-02-14 ENCOUNTER — Other Ambulatory Visit: Payer: Self-pay

## 2022-02-14 ENCOUNTER — Inpatient Hospital Stay: Payer: Medicare Other

## 2022-02-14 ENCOUNTER — Inpatient Hospital Stay: Payer: Medicare Other | Attending: Hematology & Oncology

## 2022-02-14 DIAGNOSIS — Z95828 Presence of other vascular implants and grafts: Secondary | ICD-10-CM

## 2022-02-14 DIAGNOSIS — Z452 Encounter for adjustment and management of vascular access device: Secondary | ICD-10-CM | POA: Insufficient documentation

## 2022-02-14 DIAGNOSIS — K909 Intestinal malabsorption, unspecified: Secondary | ICD-10-CM | POA: Diagnosis present

## 2022-02-14 DIAGNOSIS — D508 Other iron deficiency anemias: Secondary | ICD-10-CM | POA: Insufficient documentation

## 2022-02-14 MED ORDER — HEPARIN SOD (PORK) LOCK FLUSH 100 UNIT/ML IV SOLN
500.0000 [IU] | Freq: Once | INTRAVENOUS | Status: AC
  Administered 2022-02-14: 500 [IU] via INTRAVENOUS

## 2022-02-14 MED ORDER — SODIUM CHLORIDE 0.9% FLUSH
10.0000 mL | Freq: Once | INTRAVENOUS | Status: AC
  Administered 2022-02-14: 10 mL via INTRAVENOUS

## 2022-02-14 NOTE — Patient Instructions (Signed)

## 2022-02-17 ENCOUNTER — Ambulatory Visit: Payer: Medicare Other

## 2022-02-20 ENCOUNTER — Other Ambulatory Visit: Payer: Self-pay

## 2022-02-20 ENCOUNTER — Ambulatory Visit (INDEPENDENT_AMBULATORY_CARE_PROVIDER_SITE_OTHER): Payer: Medicare Other | Admitting: Licensed Clinical Social Worker

## 2022-02-20 ENCOUNTER — Ambulatory Visit: Payer: Medicare Other

## 2022-02-20 ENCOUNTER — Telehealth: Payer: Self-pay

## 2022-02-20 DIAGNOSIS — F5102 Adjustment insomnia: Secondary | ICD-10-CM | POA: Diagnosis not present

## 2022-02-20 DIAGNOSIS — F411 Generalized anxiety disorder: Secondary | ICD-10-CM

## 2022-02-20 DIAGNOSIS — M19042 Primary osteoarthritis, left hand: Secondary | ICD-10-CM

## 2022-02-20 DIAGNOSIS — M19041 Primary osteoarthritis, right hand: Secondary | ICD-10-CM

## 2022-02-20 DIAGNOSIS — F332 Major depressive disorder, recurrent severe without psychotic features: Secondary | ICD-10-CM | POA: Diagnosis not present

## 2022-02-20 DIAGNOSIS — F4321 Adjustment disorder with depressed mood: Secondary | ICD-10-CM

## 2022-02-20 MED ORDER — TRAMADOL HCL 50 MG PO TABS: 50.0000 mg | ORAL_TABLET | Freq: Three times a day (TID) | ORAL | 0 refills | Status: DC | PRN

## 2022-02-20 NOTE — Telephone Encounter (Signed)
error 

## 2022-02-20 NOTE — Telephone Encounter (Signed)
Patient requests a refill of the tramadol.  ?
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
## 2022-02-21 NOTE — Progress Notes (Signed)
? ?Office Visit Note ? ?Patient: Heather Thompson             ?Date of Birth: 01-24-1951           ?MRN: [id number]             ?PCP: Christen Butter, NP ?Referring: Christen Butter, NP ?Visit Date: 03/07/2022 ?Occupation: @GUAROCC @ ? ?Subjective:  ?Pain in multiple joints ? ?History of Present Illness: Heather Thompson is a 71 y.o. female seen in consultation per request of her PCP.  According to the patient she has had joint pain since she was a child.  Progressively the joint pain got worse over the years.  She was living in [place] where she had bilateral total hip replacement in 2018 due to ongoing pain and discomfort.  She had arthritis in her knee joints for many years.  She was also diagnosed with fibromyalgia syndrome many years ago due to generalized pain and discomfort.  At the time she was evaluated by rheumatologist and was also seen in the hospital had [facility] for muscle discomfort.  She was diagnosed with Myoadenylate deaminase deficiency.  She states her siblings also have similar deficiency.  She was diagnosed with psoriasis about 30 years ago which was mostly localized to her scalp.  She states her psoriasis is very well controlled with the topical agents.  She moved to [facility] in July 2021 and had been under care of Dr. Benjamin Stain.  Over the years she has had injections in her bilateral hands and her knee joints by Dr. Benjamin Stain.  The last injection in her right knee joint was a week ago.  She states well she was evaluated by rheumatologist and [facility] she was initially given the diagnosis of possible autoimmune disease and then was later diagnosed with osteoarthritis.  She had been taking tramadol for pain management which has not been effective.  She was recently referred to pain management where she had MRI of her cervical and lumbar spine.  She continues to be in chronic discomfort.  She will be seeing a neurosurgeon for chronic neck pain and back pain.  She had surgery on bilateral feet.  She  states she had fusion of the midfoot. ? ?Activities of Daily Living:  ?Patient reports morning stiffness for all day. ?Patient Reports nocturnal pain.  ?Difficulty dressing/grooming: Denies ?Difficulty climbing stairs: Reports ?Difficulty getting out of chair: Reports ?Difficulty using hands for taps, buttons, cutlery, and/or writing: Reports ? ?Review of Systems  ?Constitutional:  Positive for fatigue.  ?HENT:  Negative for mouth sores, mouth dryness and nose dryness.   ?Eyes:  Positive for dryness. Negative for pain and itching.  ?Respiratory:  Negative for shortness of breath and difficulty breathing.   ?Cardiovascular:  Negative for chest pain and palpitations.  ?Gastrointestinal:  Positive for constipation. Negative for blood in stool and diarrhea.  ?Endocrine: Positive for cold intolerance. Negative for increased urination.  ?Genitourinary:  Negative for difficulty urinating.  ?Musculoskeletal:  Positive for joint pain, joint pain, myalgias, morning stiffness, muscle tenderness and myalgias. Negative for joint swelling.  ?Skin:  Positive for color change. Negative for rash, redness and sensitivity to sunlight.  ?Allergic/Immunologic: Negative for susceptible to infections.  ?Neurological:  Positive for numbness, parasthesias and weakness. Negative for dizziness, headaches and memory loss.  ?Hematological:  Positive for bruising/bleeding tendency. Negative for swollen glands.  ?Psychiatric/Behavioral:  Positive for depressed mood and sleep disturbance. Negative for confusion. The patient is nervous/anxious.   ? ?PMFS History:  ?Patient Active Problem  List  ? Diagnosis Date Noted  ? Primary osteoarthritis of both hands 12/21/2021  ? Primary osteoarthritis of both knees 12/21/2021  ? Port-A-Cath in place 07/29/2021  ? Acquired hypothyroidism 07/29/2021  ? Chronic fatigue 07/29/2021  ? Vitamin B12 deficiency 07/29/2021  ? Vitamin D deficiency 07/29/2021  ? Postmenopausal estrogen deficiency 07/29/2021  ? Screening  for osteoporosis 07/29/2021  ? IBS (irritable bowel syndrome) 04/06/2021  ? Retention of urine 04/06/2021  ? Herniated lumbar intervertebral disc 11/12/2020  ? Skin sensation disturbance 11/12/2020  ? 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 Low serum iron 04/23/2019  ? Osteomalacia 04/23/2019  ? Presence of right artificial hip joint 06/26/2018  ? Gastroesophageal reflux disease 06/18/2018  ? Hypertension 06/18/2018  ? Intertrigo 04/30/2018  ? Lentigines 04/30/2018  ? Hyponatremia 01/17/2018  ? History of left hip replacement 01/14/2018  ? History of cervical spinal surgery 01/01/2018  ? SK (seborrheic keratosis) 04/18/2017  ? Pain management contract agreement 11/17/2016  ? 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 Allergic rhinitis due to animal hair and dander 12/02/2015  ? Low back pain 03/03/2015  ? 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 Neutrophilic leukocytosis 08/12/2012  ? Thrombocytosis 08/12/2012  ? 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  ? Hyperlipidemia 08/19/2010  ? Impaired fasting glucose 08/19/2010  ? Polyarthritis 09/28/2009  ? Degenerative disc disease 04/07/2009  ? Depression with anxiety 04/07/2009  ? Myopathy 04/07/2009  ? Insomnia 04/07/2009  ? Migraine 04/07/2009  ? Scoliosis 04/07/2009  ? Seasonal allergies 04/07/2009  ? Vitamin B12 deficiency anemia 04/07/2009  ?  ?Past Medical History:  ?Diagnosis Date  ? Allergy   ? Anemia   ? Clotting disorder (HCC)   ? Depression   ? Fibromyalgia   ? Hypertension   ? Hypothyroidism   ? Muscle disorder   ? Myoadenylate deaminase deficiency (HCC)   ? Salzmann nodular degeneration   ?  ?Family History  ?Problem Relation Age of Onset  ? Hypertension Mother   ? Heart attack Mother   ? Lung cancer Father   ? Prostate cancer Father   ? Kidney cancer Brother   ? Healthy Son   ? Healthy Daughter   ? ?Past Surgical History:  ?Procedure Laterality Date  ? ABDOMINAL HYSTERECTOMY    ? APPENDECTOMY    ? CERVICAL FUSION    ? CHOLECYSTECTOMY    ? FOOT  SURGERY Bilateral   ? fusion  ? TOTAL HIP ARTHROPLASTY Bilateral   ? 11/2016 & 05/2017  ? ?Social History  ? ?Social History Narrative  ? Lives alone. She has two children and 4 grandchildren. She stays active, enjoys doing crafts and painting.   ? ?Immunization History  ?Administered Date(s) Administered  ? Fluad Quad(high Dose 65+) 07/29/2021  ? H1N1 12/04/2008  ? Influenza Split 08/22/2012  ? Influenza Whole 10/04/2007, 09/03/2008, 09/07/2009  ? Influenza, High Dose Seasonal PF 08/13/2015, 08/25/2020  ? Influenza,inj,Quad PF,6+ Mos 08/29/2016  ? Influenza-Unspecified 08/31/2017, 09/03/2018, 09/04/2019  ? PFIZER(Purple Top)SARS-COV-2 Vaccination 01/24/2020, 02/21/2020, 12/10/2020  ? PPD Test 06/12/2007, 10/17/2012, 10/28/2012, 08/29/2013, 09/08/2013  ? Pneumococcal Conjugate-13 09/13/2015  ? Pneumococcal Polysaccharide-23 10/04/2007, 12/01/2011, 02/21/2017  ? Tdap 11/28/1996, 06/09/2009  ? Zoster, Live 10/06/2013  ?  ? ?Objective: ?Vital Signs: BP (!) 161/83 (BP Location: Right Arm, Patient Position: Sitting, Cuff Size: Normal)   Pulse 91   Ht 5\' 4"  (1.626 m)   Wt 174 lb (78.9 kg)   BMI 29.87 kg/m?   ? ?Physical Exam ?Vitals and nursing note reviewed.  ?  Constitutional:   ?   Appearance: She is well-developed.  ?HENT:  ?   Head: Normocephalic and atraumatic.  ?Eyes:  ?   Conjunctiva/sclera: Conjunctivae normal.  ?Cardiovascular:  ?   Rate and Rhythm: Normal rate and regular rhythm.  ?   Heart sounds: Normal heart sounds.  ?Pulmonary:  ?   Effort: Pulmonary effort is normal.  ?   Breath sounds: Normal breath sounds.  ?Abdominal:  ?   General: Bowel sounds are normal.  ?   Palpations: Abdomen is soft.  ?Musculoskeletal:  ?   Cervical back: Normal range of motion.  ?Lymphadenopathy:  ?   Cervical: No cervical adenopathy.  ?Skin: ?   General: Skin is warm and dry.  ?   Capillary Refill: Capillary refill takes less than 2 seconds.  ?Neurological:  ?   Mental Status: She is alert and oriented to person, place, and  time.  ?Psychiatric:     ?   Behavior: Behavior normal.  ?  ? ?Musculoskeletal Exam: She had limited lateral rotation flexion and extension of her cervical spine.  Thoracolumbar scoliosis was noted.  She had lim

## 2022-02-27 ENCOUNTER — Ambulatory Visit (INDEPENDENT_AMBULATORY_CARE_PROVIDER_SITE_OTHER): Payer: Medicare Other | Admitting: Licensed Clinical Social Worker

## 2022-02-27 DIAGNOSIS — F411 Generalized anxiety disorder: Secondary | ICD-10-CM

## 2022-02-27 DIAGNOSIS — F4321 Adjustment disorder with depressed mood: Secondary | ICD-10-CM

## 2022-02-27 DIAGNOSIS — F332 Major depressive disorder, recurrent severe without psychotic features: Secondary | ICD-10-CM
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
## 2022-03-01 ENCOUNTER — Telehealth (HOSPITAL_COMMUNITY): Payer: Self-pay

## 2022-03-01 MED ORDER — HYDROXYZINE HCL 10 MG PO TABS: 10.0000 mg | ORAL_TABLET | Freq: Every evening | ORAL | 0 refills | Status: DC | PRN

## 2022-03-01 NOTE — Telephone Encounter (Signed)
Received a fax from [facility] on [address] in [place] requesting a refill on pt's Hydroxyzine (Atarax) 10mg . Pt has followup appointment scheduled for 5/25. Please review and advise. Thank you ?

## 2022-03-02 NOTE — Telephone Encounter (Signed)
NOTIFIED PATIENT °

## 2022-03-06 ENCOUNTER — Ambulatory Visit (INDEPENDENT_AMBULATORY_CARE_PROVIDER_SITE_OTHER): Payer: Medicare Other

## 2022-03-06 ENCOUNTER — Ambulatory Visit (INDEPENDENT_AMBULATORY_CARE_PROVIDER_SITE_OTHER): Payer: Medicare Other | Admitting: Sports Medicine

## 2022-03-06 ENCOUNTER — Ambulatory Visit (INDEPENDENT_AMBULATORY_CARE_PROVIDER_SITE_OTHER): Payer: Medicare Other | Admitting: Licensed Clinical Social Worker

## 2022-03-06 DIAGNOSIS — E538 Deficiency of other specified B group vitamins: Secondary | ICD-10-CM

## 2022-03-06 DIAGNOSIS — F332 Major depressive disorder, recurrent severe without psychotic features: Secondary | ICD-10-CM

## 2022-03-06 DIAGNOSIS — F411 Generalized anxiety disorder: Secondary | ICD-10-CM

## 2022-03-06 DIAGNOSIS — M19041 Primary osteoarthritis, right hand: Secondary | ICD-10-CM | POA: Diagnosis not present

## 2022-03-06 DIAGNOSIS — M19042 Primary osteoarthritis, left hand: Secondary | ICD-10-CM | POA: Diagnosis not present

## 2022-03-06 DIAGNOSIS — F4321 Adjustment disorder with depressed mood: Secondary | ICD-10-CM | POA: Diagnosis not present

## 2022-03-06 DIAGNOSIS — M170 Bilateral primary osteoarthritis of knee: Secondary | ICD-10-CM

## 2022-03-06 DIAGNOSIS — F5102 Adjustment insomnia: Secondary | ICD-10-CM | POA: Diagnosis not present

## 2022-03-06 MED ORDER — CYANOCOBALAMIN 1000 MCG/ML IJ SOLN
1000.0000 ug | Freq: Once | INTRAMUSCULAR | Status: AC
  Administered 2022-03-06: 1000 ug via INTRAMUSCULAR

## 2022-03-06 MED ORDER — TRAMADOL HCL 50 MG PO TABS: 100.0000 mg | ORAL_TABLET | Freq: Three times a day (TID) | ORAL | 0 refills | Status: DC | PRN

## 2022-03-06 NOTE — Assessment & Plan Note (Signed)
Under the care of Dr. Caralyn Guile, she failed conservative treatment, operative intervention is planned. ?

## 2022-03-06 NOTE — Assessment & Plan Note (Signed)
Did not improve with Celebrex, we injected her right knee today. ?Increased tramadol. ?Return to see me in 4 to 6 weeks. ?

## 2022-03-06 NOTE — Progress Notes (Signed)
? ?THERAPIST PROGRESS NOTE ? ?Session Time: 10:00 AM to 10:53 AM ? ?Participation Level: Active ? ?Behavioral Response: CasualAlertappropriate ? ?Type of Therapy: Individual Therapy ? ?Treatment Goals addressed: Work on PTSD, depression, anxiety, grief ? ?ProgressTowards Goals: Progressing-actively working on grief, depression in session processing feelings identifying helpful coping strategies ? ?Interventions: CBT, Solution Focused, Play Therapy, Supportive, and Other: Grief, coping ? ?Summary: Heather Thompson is a 71 y.o. female who presents with talked about Easter with sister. Cried when left missing her kids. Wants her kids to be with her but back there is not home anymore.  On the positive side she did do crafts with her sister till 2 in the morning and therapist noted this is a positive sign for patient. Talked about when she met her husband Lynann Bologna. Knew right away would be together. Had a good life but did not have good communication. His goals and hers were two different things. Patient wanted to retire early and move to [place]. Married in [place] in 70, 75 transferred to [place]. He worked at  [employer]. Couple months later mom had her heart attack. Felt her fault moving away. Lived in [place] 70-76 moved to [place] 26 years. Moved in summer [address] [place] 21 years. Now doesn't know where home is. While in [place] didn't like it there because of her arthritis, her body didn't like the cold. Stayed because her  kids got older and they got married where their life was and wanted to be present in their life so didn't move until May 2021 and by then too late. Patient wanted to move but was torn. Wanted to retire early and husband wanted to work until they carried him out of work. Wanted a life besides him working. That was the point of contention. Depression get bad and he didn't understand he tried. Couldn't talk to him when in a depressive episode would go with car and sit for  hours because couldn't talk to him. He was one to stick his head in sand didn't understand how to help and she didn't know how to tell him how to hurt. Issues through years her depression he not understanding and she not explaining what going on in her head. 24 ECT treatments hospitalized in early 80's many times. Last of it was when she was watching Merrily Pew who is now 72 was last thought of suicide that [place] (her daughter) knew about and told patient if commit suicide would tell Josh patient didn't love him. That was the final straw and patient did not have the level of severity of symptoms afterwards. Spent adult life with depression. Grandchildren Johnn Hai, Landry Dyke.Loved Lynann Bologna but always wondered what it would have been like to have other relationships Not that she didn't love him he was a great guy. Feels missed out on normal dating. Great provider, not a drinker, he worked, great father. But her dream put aside he wasn't going to retire. Patient got disability in 66 and continued to work and then 89 full retirement. When he announced selling the house moving to [place] and he was sick ended up only having 4 months. ? ?Therapist reviewed patient's holiday noted some positive aspects but at the same time validating patient feeling like does not have a home, being so to speak a nomad.  Therapist shared a little her background to help patient in knowing she is not alone and can be supported by somebody understanding struggles in life.  Session helpful for  patient to talk about feelings such as her own disappointment with some of her dreams not materializing, having a great relationship but at the same time some of the struggles in the relationship.  Therapist providing support and space for patient as she shared her thoughts and feelings in session therapist utilized CBT and solution focused brief therapy therapist assesses patient being validated can be helpful as patient attempts to move  forward to create a life for herself a new chapter.  Therapist provided active listening open questions supportive interventions ? ?Suicidal/Homicidal: No ? ?Plan: Return again in 2 weeks.2.  Continue to review past issues to help patient and working through them, look at grief handout, support patient in creating new chapter of her life ? ?Diagnosis: major depressive disorder, recurrent, severe, generalized anxiety disorder ? ?Collaboration of Care: Other none needed ? ?Patient/Guardian was advised Release of Information must be obtained prior to any record release in order to collaborate their care with an outside provider. Patient/Guardian was advised if they have not already done so to contact the registration department to sign all necessary forms in order for [place] to release information regarding their care.  ? ?Consent: Patient/Guardian gives verbal consent for treatment and assignment of benefits for services provided during this visit. Patient/Guardian expressed understanding and agreed to proceed.  ? ?Cordella Register, LCSW ?03/06/2022 ? ?

## 2022-03-06 NOTE — Progress Notes (Signed)
? ? ?  Procedures performed today:   ? ?Procedure: Real-time Ultrasound Guided injection of the right knee ?Device: Samsung HS60  ?Verbal informed consent obtained.  ?Time-out conducted.  ?Noted no overlying erythema, induration, or other signs of local infection.  ?Skin prepped in a sterile fashion.  ?Local anesthesia: Topical Ethyl chloride.  ?With sterile technique and under real time ultrasound guidance: Mild effusion noted, 1 cc Kenalog 40, 2 cc lidocaine, 2 cc bupivacaine injected easily ?Completed without difficulty  ?Advised to call if fevers/chills, erythema, induration, drainage, or persistent bleeding.  ?Images permanently stored and available for review in PACS.  ?Impression: Technically successful ultrasound guided injection. ? ?Independent interpretation of notes and tests performed by another provider:  ? ?None. ? ?Brief History, Exam, Impression, and Recommendations:   ? ?Primary osteoarthritis of both knees ?Did not improve with Celebrex, we injected her right knee today. ?Increased tramadol. ?Return to see me in 4 to 6 weeks. ? ?Primary osteoarthritis of both hands ?Under the care of Dr. Melvyn Novas, she failed conservative treatment, operative intervention is planned. ? ? ? ?___________________________________________ ?Ihor Austin. Benjamin Stain, M.D., ABFM., CAQSM. ?Primary Care and Sports Medicine ?[facility] ? ?Adjunct Instructor of Family Medicine  ?[employer] ?

## 2022-03-06 NOTE — Addendum Note (Signed)
Addended by: Gaynelle Arabian on: 03/06/2022 02:50 PM ? ? Modules accepted: Orders ? ?

## 2022-03-07 ENCOUNTER — Ambulatory Visit (INDEPENDENT_AMBULATORY_CARE_PROVIDER_SITE_OTHER): Payer: Medicare Other | Admitting: Rheumatology

## 2022-03-07 ENCOUNTER — Encounter: Payer: Self-pay | Admitting: Rheumatology

## 2022-03-07 VITALS — BP 161/83 | HR 91 | Ht 64.0 in | Wt 174.0 lb

## 2022-03-07 DIAGNOSIS — Z96642 Presence of left artificial hip joint: Secondary | ICD-10-CM

## 2022-03-07 DIAGNOSIS — M5126 Other intervertebral disc displacement, lumbar region: Secondary | ICD-10-CM

## 2022-03-07 DIAGNOSIS — Z8669 Personal history of other diseases of the nervous system and sense organs: Secondary | ICD-10-CM

## 2022-03-07 DIAGNOSIS — Z9889 Other specified postprocedural states: Secondary | ICD-10-CM

## 2022-03-07 DIAGNOSIS — Z8639 Personal history of other endocrine, nutritional and metabolic disease: Secondary | ICD-10-CM

## 2022-03-07 DIAGNOSIS — L821 Other seborrheic keratosis: Secondary | ICD-10-CM

## 2022-03-07 DIAGNOSIS — Z95828 Presence of other vascular implants and grafts: Secondary | ICD-10-CM

## 2022-03-07 DIAGNOSIS — M170 Bilateral primary osteoarthritis of knee: Secondary | ICD-10-CM

## 2022-03-07 DIAGNOSIS — L408 Other psoriasis: Secondary | ICD-10-CM

## 2022-03-07 DIAGNOSIS — M130 Polyarthritis, unspecified: Secondary | ICD-10-CM

## 2022-03-07 DIAGNOSIS — K219 Gastro-esophageal reflux disease without esophagitis: Secondary | ICD-10-CM

## 2022-03-07 DIAGNOSIS — Z8719 Personal history of other diseases of the digestive system: Secondary | ICD-10-CM

## 2022-03-07 DIAGNOSIS — F418 Other specified anxiety disorders: Secondary | ICD-10-CM

## 2022-03-07 DIAGNOSIS — M810 Age-related osteoporosis without current pathological fracture: Secondary | ICD-10-CM

## 2022-03-07 DIAGNOSIS — M19042 Primary osteoarthritis, left hand: Secondary | ICD-10-CM

## 2022-03-07 DIAGNOSIS — L409 Psoriasis, unspecified: Secondary | ICD-10-CM

## 2022-03-07 DIAGNOSIS — M5030 Other cervical disc degeneration, unspecified cervical region: Secondary | ICD-10-CM

## 2022-03-07 DIAGNOSIS — I7300 Raynaud's syndrome without gangrene: Secondary | ICD-10-CM

## 2022-03-07 DIAGNOSIS — M5136 Other intervertebral disc degeneration, lumbar region: Secondary | ICD-10-CM

## 2022-03-07 DIAGNOSIS — M19041 Primary osteoarthritis, right hand: Secondary | ICD-10-CM

## 2022-03-07 DIAGNOSIS — E039 Hypothyroidism, unspecified: Secondary | ICD-10-CM

## 2022-03-07 DIAGNOSIS — D75839 Thrombocytosis, unspecified: Secondary | ICD-10-CM

## 2022-03-07 DIAGNOSIS — M4302 Spondylolysis, cervical region: Secondary | ICD-10-CM

## 2022-03-07 DIAGNOSIS — E792 Myoadenylate deaminase deficiency: Secondary | ICD-10-CM

## 2022-03-07 DIAGNOSIS — E538 Deficiency of other specified B group vitamins: Secondary | ICD-10-CM

## 2022-03-07 DIAGNOSIS — E559 Vitamin D deficiency, unspecified: Secondary | ICD-10-CM

## 2022-03-07 DIAGNOSIS — R5382 Chronic fatigue, unspecified: Secondary | ICD-10-CM

## 2022-03-07 DIAGNOSIS — I10 Essential (primary) hypertension: Secondary | ICD-10-CM

## 2022-03-07 DIAGNOSIS — Z9884 Bariatric surgery status: Secondary | ICD-10-CM

## 2022-03-07 DIAGNOSIS — M797 Fibromyalgia: Secondary | ICD-10-CM

## 2022-03-07 DIAGNOSIS — Z96643 Presence of artificial hip joint, bilateral: Secondary | ICD-10-CM | POA: Diagnosis not present

## 2022-03-07 DIAGNOSIS — Z780 Asymptomatic menopausal state: Secondary | ICD-10-CM

## 2022-03-07 DIAGNOSIS — M4125 Other idiopathic scoliosis, thoracolumbar region: Secondary | ICD-10-CM

## 2022-03-20 ENCOUNTER — Ambulatory Visit (HOSPITAL_COMMUNITY): Payer: Medicare Other | Admitting: Licensed Clinical Social Worker

## 2022-03-27 ENCOUNTER — Ambulatory Visit (INDEPENDENT_AMBULATORY_CARE_PROVIDER_SITE_OTHER): Payer: Medicare Other | Admitting: Licensed Clinical Social Worker

## 2022-03-27 DIAGNOSIS — F4321 Adjustment disorder with depressed mood: Secondary | ICD-10-CM | POA: Diagnosis not present

## 2022-03-27 DIAGNOSIS — F411 Generalized anxiety disorder: Secondary | ICD-10-CM

## 2022-03-27 DIAGNOSIS — F332 Major depressive disorder, recurrent severe without psychotic features: Secondary | ICD-10-CM

## 2022-03-27 NOTE — Progress Notes (Signed)
? ?THERAPIST PROGRESS NOTE ? ?Session Time: 1:00 PM to 1:54 PM ? ?Participation Level: Active ? ?Behavioral Response: CasualAlertAnxious and Depressed, tearful at times in session ? ?Type of Therapy: Individual Therapy ? ?Treatment Goals addressed: Work on PTSD, depression, anxiety, grief ? ?ProgressTowards Goals: Progressing-actively use sessions to work on coping with stressors processing feeling therapist providing supportive interventions strength-based interventions ? ?Interventions: Solution Focused, Strength-based, Supportive, and Other: coping ? ?Summary: Heather Thompson is a 71 y.o. female who presents with talked about eye surgery, appointment with neurosurgeon this afternoon related to getting a MRI and tumor on neck. Had a neck fusion, bad spine, both hips replaced has osteoporosis, degenerated disc, bulging disc herniated disc. Arms and hands tingling from neck fusion.  Now has to have back surgery it is between C7 to T 1 spine surgery concernd her. Cervical and thoracic region. Described concerns dangerous, not sure how big is going be and no family around. Falling apart and don't like it. Live with chronic pain, Dr. Karie Schwalbe did hand injections. Sent her to see a Hydrographic surveyor and needs big hand surgery. Said address neck first and hand bad come back. Referred to pain management. Need to keep sane broke down the other night there is a lot going on up [place] and going to miss out on. Bothering her and tearful. 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 Issues growing up had to be excused from a lot of activity.  All degenerative. Learned to live what had, feet fused, neck fused, one back surgery. Always wanted to live in warmer client. Shared daughter-in-law 68 birthday, last grandchildren graduate from high school, son's fifty birthday, daughter's 32 anniversary, 4th of July her birthday and [name]'s birthday going to be here. Put her over the edge can't be there and  missing all these occasions used to get together big occasions and now facing big surgeries and financially can't afford to go. July 15 supposed to sign a new lease can't afford to stay there. Name on other lists doesn't know what doing.  Noticed daughter providing perspective and therapist agreed happens in life  people move. Noted ways life keeps [place] from being happy.  Therapist noted her perspective while trying to make a life health issues catching up with her. Things went down hill after husband heart attack. Can't help but have guilt and regret husband's diabetes couldn't help more. Know enough can't change him.  Reviewed session patient said needed to vent been building up to this.  Trying her best 49 anniversary 4 months after he passed, lost dream house lost money.  ? ?Therapist reviewed symptoms, facilitated expression of thoughts and feelings utilizing interventions to help patient navigate difficult times for her with medical issues on top of being far away from family and missing out on things.  Therapist worked on perspectives that would help patient with coping, for example normalizing the experience of missing out on family activities for different reasons one of them being the distance the realities of life now being further away from family.  Therapist feedback to patient was going to [place] was a choice she would have made so does not see anything wrong with a choice just did not have foresight to know what was going to happen and that was not within her control that her husband would get back so quickly.  Noted part of her condition as we cannot predict the future so all we can do is make  the best choices we are able and often we can look back in retrospect and see making better choices that at the time we could not see.  Noted life can be about things not going her way and figuring out a way to make the best of what we have also terms of aging process noted as losses happen having to reinvent  ourselves, adapting and being resilient is learning to adjust with the changes and trying to make a life for self but not easy therapist also validated patient on the challenges she is having as well as giving her space to share her thoughts and feelings in session.  Therapist utilized CBT and solution brief therapy to address mood and challenges she is facing.  Also looked at patient's strengths her acuity and understanding some of the concepts and navigating life able to label them and verbalize them as well as her expression saying she is young at heart. ? ?Suicidal/Homicidal: No ? ?Plan: Return again in 1 week.2.Continue to review past issues to help patient and working through them, look at grief handout, support patient in creating new chapter of her life ? ?Diagnosis: major depressive disorder, recurrent, severe, generalized anxiety disorder ? ?Collaboration of Care: Other none needed ? ?Patient/Guardian was advised Release of Information must be obtained prior to any record release in order to collaborate their care with an outside provider. Patient/Guardian was advised if they have not already done so to contact the registration department to sign all necessary forms in order for [place] to release information regarding their care.  ? ?Consent: Patient/Guardian gives verbal consent for treatment and assignment of benefits for services provided during this visit. Patient/Guardian expressed understanding and agreed to proceed.  ? ?Coolidge Breeze, LCSW ?03/27/2022 ? ?

## 2022-03-28 ENCOUNTER — Ambulatory Visit: Payer: Medicare Other | Admitting: Rheumatology

## 2022-03-29 ENCOUNTER — Telehealth (HOSPITAL_BASED_OUTPATIENT_CLINIC_OR_DEPARTMENT_OTHER): Payer: Self-pay | Admitting: Family Medicine

## 2022-03-29 NOTE — Telephone Encounter (Signed)
Received call from on-call nurse regarding patient.  Patient reports that she had cervical spine procedure yesterday and had acute worsening of pain and reports being prescribed hydrocodone by specialist.  At midnight, patient took dose of hydrocodone but continued to have severe pain about 2 hours later.  This pain however was more so in her lower back.  She does have longstanding history of spine related issues including lumbar spine.  Given acute, severe pain, recommendation was for patient to proceed to emergency department for further evaluation due to severity of symptoms. ?

## 2022-04-03 ENCOUNTER — Ambulatory Visit (INDEPENDENT_AMBULATORY_CARE_PROVIDER_SITE_OTHER): Payer: Medicare Other | Admitting: Licensed Clinical Social Worker

## 2022-04-03 ENCOUNTER — Telehealth (HOSPITAL_COMMUNITY): Payer: Self-pay | Admitting: Psychiatry

## 2022-04-03 ENCOUNTER — Ambulatory Visit (INDEPENDENT_AMBULATORY_CARE_PROVIDER_SITE_OTHER): Payer: Medicare Other | Admitting: Medical-Surgical

## 2022-04-03 ENCOUNTER — Emergency Department (INDEPENDENT_AMBULATORY_CARE_PROVIDER_SITE_OTHER)
Admission: EM | Admit: 2022-04-03 | Discharge: 2022-04-03 | Disposition: A | Discharge status: Home / Self Care | Payer: Medicare Other | Source: Home / Self Care | Attending: Family Medicine | Admitting: Family Medicine

## 2022-04-03 VITALS — BP 179/79 | HR 76

## 2022-04-03 DIAGNOSIS — F4321 Adjustment disorder with depressed mood: Secondary | ICD-10-CM | POA: Diagnosis not present

## 2022-04-03 DIAGNOSIS — R519 Headache, unspecified: Secondary | ICD-10-CM | POA: Diagnosis not present

## 2022-04-03 DIAGNOSIS — E538 Deficiency of other specified B group vitamins: Secondary | ICD-10-CM

## 2022-04-03 DIAGNOSIS — F411 Generalized anxiety disorder: Secondary | ICD-10-CM | POA: Diagnosis not present

## 2022-04-03 DIAGNOSIS — I10 Essential (primary) hypertension: Secondary | ICD-10-CM | POA: Diagnosis not present

## 2022-04-03 DIAGNOSIS — F332 Major depressive disorder, recurrent severe without psychotic features: Secondary | ICD-10-CM

## 2022-04-03 MED ORDER — KETOROLAC TROMETHAMINE 30 MG/ML IJ SOLN
30.0000 mg | Freq: Once | INTRAMUSCULAR | Status: AC
  Administered 2022-04-03: 30 mg via INTRAMUSCULAR

## 2022-04-03 MED ORDER — ACETAMINOPHEN 325 MG PO TABS
650.0000 mg | ORAL_TABLET | Freq: Once | ORAL | Status: AC
  Administered 2022-04-03: 650 mg via ORAL

## 2022-04-03 MED ORDER — DEXAMETHASONE SODIUM PHOSPHATE 10 MG/ML IJ SOLN
6.0000 mg | Freq: Once | INTRAMUSCULAR | Status: AC
  Administered 2022-04-03: 6 mg via INTRAMUSCULAR

## 2022-04-03 MED ORDER — HYDROXYZINE HCL 10 MG PO TABS: 10.0000 mg | ORAL_TABLET | Freq: Every evening | ORAL | 0 refills | Status: DC | PRN

## 2022-04-03 MED ORDER — DEXAMETHASONE SODIUM PHOSPHATE 10 MG/ML IJ SOLN: 6.0000 mg | Freq: Once | INTRAMUSCULAR | Status: DC

## 2022-04-03 MED ORDER — CYANOCOBALAMIN 1000 MCG/ML IJ SOLN
1000.0000 ug | Freq: Once | INTRAMUSCULAR | Status: AC
  Administered 2022-04-03: 1000 ug via INTRAMUSCULAR

## 2022-04-03 NOTE — Telephone Encounter (Signed)
Sent for a one month supply 

## 2022-04-03 NOTE — Progress Notes (Signed)
? ?  Established Patient Office Visit ? ?Subjective   ?Patient ID: Heather Thompson, female    DOB: 1951-06-09  Age: 71 y.o. MRN: [id number] ? ?Chief Complaint  ?Patient presents with  ? Pernicious Anemia  ? ? ?HPI ? ?Heather Thompson is here for a vitamin B 12 injection. Denies muscle cramps, weakness or irregular heart rate. Blood pressure is elevated today. She is in pain due to recent eye surgery.  ? ?ROS ? ?  ?Objective:  ?  ? ?BP (!) 179/79   Pulse 76  ? ? ?Physical Exam ? ? ?No results found for any visits on 04/03/22. ? ? ? ?The 10-year ASCVD risk score (Arnett DK, et al., 2019) is: 23.9% ? ?  ?Assessment & Plan:  ?B12 deficiency - Patient tolerated injection well without complications. Patient advised to schedule next injection 30 days from today.   ? ?She will follow up in a few weeks for her blood pressure.  ? ? ? ?Problem List Items Addressed This Visit   ?None ?Visit Diagnoses   ? ? B12 deficiency    -  Primary  ? Relevant Medications  ? cyanocobalamin ((VITAMIN B-12)) injection 1,000 mcg (Completed) (Start on 04/03/2022  2:30 PM)  ? ?  ? ? ?Return in about 4 weeks (around 05/01/2022) for B12 injection. .  ? ? ?Esmond Harps, CMA ? ?

## 2022-04-03 NOTE — Telephone Encounter (Signed)
Per pt  ?Needs refill on Hydroxyzine  ? ?[facility] in [place] ?

## 2022-04-03 NOTE — Progress Notes (Signed)
In person ? ?THERAPIST PROGRESS NOTE ? ?Session Time: 1:00 PM to 1:53 PM ? ?Participation Level: Active ? ?Behavioral Response: CasualAlertAnxious and Depressed ? ?Type of Therapy: Individual Therapy ? ?Treatment Goals addressed: Work on PTSD, depression, anxiety, grief ? ?ProgressTowards Goals: Progressing-actively working on stressors and healthy ways to cope ? ?Interventions: Solution Focused, Strength-based, Supportive, Reframing, and Other: Coping ? ?Summary: Heather Thompson is a 71 y.o. female who presents with saw the next surgery surgery not an option for surgery not fixable. Tumor is not the problem.  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 Eye opening couple of weeks. Hoping that there is a fix but there isn't realizing have to come to a place of accepting though still will look for different opinions on this issue.  Back to pain management doctor. Doctor said steroid injection. It was brutal and it did not help. They offered slight sedation but need a ride home so didn't take it. So bad couldn't move had to help her off the table sat in room for two hours and still couldn't a ride home, didn't help supposed to have one on lower back and not sure.  Therapist explored this with patient to decide best option.  Refer to other doctors for a fix but also believes not fixable. Has to accept it. And also discs in the neck play a part in issues told that for years not fixable though patient determined to have it fix. Mild opiate management don't feel right taking it. Live with chronic pain. Doesn't think clearly doesn't function right. Also concerned about addictive quality. Could have easily been addicted. Has been getting worse over the past few years. Has to live with pain and try to get more active. Have to keep moving. Order to get aquatic therapy. Mentally accepting is the hard part. Here making whatever the best but scary to wonder... beat the five year mark prediction of wheelchair feel back and neck worse  affect her extremities. Wonders if going to be dependent in 9-10 years because can't take care of herself. Husband took care of her and now on her own to take care of her. Only possibility is hand and wrist surgery. But how if do one at a time take care of herself. It is discouraging. Crappy spine with muscle disorder that complicates things. No fixing hoping can stay as mobile and function as long as possible.  Do the best patient understands as long as you can do the best with what we have. What we should be doing eating healthier moving more, has to eat healthier. Not cure but will help her feel better. When did a walking program felt better mentally. What causes stress worry about the future can't control and change can help yourself to some degree. Body not fixable do what you can do which is her part. Got to start trying have to feel better before start. Right now not feeling well physically so doesn't feel energy or motivation to do it. Know that is part of it somehow have to force herself not making better sitting, maybe somewhat better by moving but when pain don't want to do it. When in pain it hurts so much. Paradigm shift husband who would help her. Don't want to end up in nursing home and doing nothing. Sit outside where get more sun. Daughter says go sit by the pool.  Now plan to look for small place to live talked about the benefits for her.   ? ?Therapist reviewed symptoms facilitated expression  of thoughts and feelings utilizing this as a treatment intervention to help patient process feelings to help with coping patient sharing more updated medical news relating her insight that she needs to come to a point of acceptance of her body where it sat and do everything she can on her part to help the situation.  Therapist noted this is good coping that some of the things she can do can help slow down process of aging, way to approach the situation is to do what ever she can and may help the situation.   The same time good to get different perspectives to make sure doctors are all on the same page and how they see her medical condition.  Therapist noted overlapping of mental health with the strategies such as exercise patient aware herself from experiences in her life when she felt better from doing things like being outside and walking.  Therapist noted particularly being outside with exercise helps mood encouraged her with water aquatic sharing personal story with parent who really benefited from this exercise lost weight and was healthier.  Encouraged patient with setting goals to start working on getting healthier but also noticed her own insight in the process to help her move forward.  Encouraged her with going to the pool same time validating struggles paradigm shift having to take care of herself now by herself where she had husband for many years who took care of her with her medical issues.  Therapist provided support and space to patient as she shared her thoughts and feelings in session. ?Suicidal/Homicidal: No ? ?Plan: Return again in 1 week.2.Continue to review past issues to help patient and working through them, look at grief handout, support patient in creating new chapter of her life ? ?Diagnosis: major depressive disorder, recurrent, severe, generalized anxiety disorder ? ?Collaboration of Care: Other none needed ? ?Patient/Guardian was advised Release of Information must be obtained prior to any record release in order to collaborate their care with an outside provider. Patient/Guardian was advised if they have not already done so to contact the registration department to sign all necessary forms in order for [place] to release information regarding their care.  ? ?Consent: Patient/Guardian gives verbal consent for treatment and assignment of benefits for services provided during this visit. Patient/Guardian expressed understanding and agreed to proceed.  ? ?Coolidge Breeze, LCSW ?04/03/2022 ? ?

## 2022-04-03 NOTE — Progress Notes (Signed)
Agree with documentation as above.  ? ?___________________________________________ ?Marcea Rojek L. Larsen Zettel, DNP, APRN, FNP-BC ?Primary Care and Sports Medicine ?[facility] ? ?

## 2022-04-03 NOTE — ED Triage Notes (Signed)
Pt presents with HA that has been intermittent for 2+ weeks. Pt states her BP has been increased. Pt has an appt Thursday to f/u with Christen Butter. ?

## 2022-04-03 NOTE — Discharge Instructions (Signed)
Go home and take pain medicine that works best for you (hydrocodone or tramadol) ?As soon as you have eaten something rest and then try to go to bed early ?Call tomorrow for problems ?Consider getting a blood pressure cuff and measuring your blood pressure at home ?

## 2022-04-03 NOTE — ED Provider Notes (Signed)
?[facility] ? ? ? ?CSN: [id number] ?Arrival date & time: 04/03/22  1834 ? ? ?  ? ?History   ?Chief Complaint ?Chief Complaint  ?Patient presents with  ? Headache  ? ? ?HPI ?Heather Thompson is a 71 y.o. female.  ? ?HPI ? ?Patient has a lot of health issues.  Her chart is reviewed.  She states she has had a terrible amount of stress in the last couple of years.  In addition she does have a history of migraine headaches.  She recently had eye surgery.  She recently had an epidural steroid injection for cervical disc disease.  She did had some neck stiffness.  Currently has a headache.  Its been going on for couple of weeks it is getting worse.  No visual symptoms.  No neuro symptoms.  No nausea or vomiting.  No recent illness cough cold or sinus drainage ? ?Past Medical History:  ?Diagnosis Date  ? Allergy   ? Anemia   ? Clotting disorder ([place])   ? Depression   ? Fibromyalgia   ? Hypertension   ? Hypothyroidism   ? Muscle disorder   ? 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 Salzmann nodular degeneration   ? ? ?Patient Active Problem List  ? Diagnosis Date Noted  ? Primary osteoarthritis of both hands 12/21/2021  ? Primary osteoarthritis of both knees 12/21/2021  ? Port-A-Cath in place 07/29/2021  ? Acquired hypothyroidism 07/29/2021  ? Chronic fatigue 07/29/2021  ? Vitamin B12 deficiency 07/29/2021  ? Vitamin D deficiency 07/29/2021  ? Postmenopausal estrogen deficiency 07/29/2021  ? Screening for osteoporosis 07/29/2021  ? IBS (irritable bowel syndrome) 04/06/2021  ? Retention of urine 04/06/2021  ? Herniated lumbar intervertebral disc 11/12/2020  ? Skin sensation disturbance 11/12/2020  ? 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 Low serum iron 04/23/2019  ? Osteomalacia 04/23/2019  ? Presence of right artificial hip joint 06/26/2018  ? Gastroesophageal reflux disease 06/18/2018  ? Hypertension 06/18/2018  ? Intertrigo 04/30/2018  ? Lentigines 04/30/2018  ? Hyponatremia 01/17/2018  ? History of left hip  replacement 01/14/2018  ? History of cervical spinal surgery 01/01/2018  ? SK (seborrheic keratosis) 04/18/2017  ? Pain management contract agreement 11/17/2016  ? 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 Allergic rhinitis due to animal hair and dander 12/02/2015  ? Low back pain 03/03/2015  ? 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 Neutrophilic leukocytosis 28/76/8115  ? Thrombocytosis 08/12/2012  ? 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  ? Hyperlipidemia 08/19/2010  ? Impaired fasting glucose 08/19/2010  ? Polyarthritis 09/28/2009  ? Degenerative disc disease 04/07/2009  ? Depression with anxiety 04/07/2009  ? Myopathy 04/07/2009  ? Insomnia 04/07/2009  ? Migraine 04/07/2009  ? Scoliosis 04/07/2009  ? Seasonal allergies 04/07/2009  ? Vitamin B12 deficiency anemia 04/07/2009  ? ? ?Past Surgical History:  ?Procedure Laterality Date  ? ABDOMINAL HYSTERECTOMY    ? APPENDECTOMY    ? CERVICAL FUSION    ? CHOLECYSTECTOMY    ? FOOT SURGERY Bilateral   ? fusion  ? TOTAL HIP ARTHROPLASTY Bilateral   ? 11/2016 & 05/2017  ? ? ?OB History   ?No obstetric history on file. ?  ? ? ? ?Home Medications   ? ?Prior to Admission medications   ?Medication Sig Start Date End Date Taking? Authorizing Provider  ?HYDROcodone-acetaminophen (NORCO/VICODIN) 5-325 MG tablet Take 1 tablet by mouth every 6 (six) hours as needed for moderate pain.   Yes [provider]  ?buPROPion (  WELLBUTRIN XL) 150 MG 24 hr tablet TAKE 1 TABLET(150 MG) BY MOUTH EVERY MORNING 02/03/22   Christen Butter, NP  ?buPROPion (WELLBUTRIN XL) 300 MG 24 hr tablet Take 1 tablet (300 mg total) by mouth daily. 11/08/21   Christen Butter, NP  ?celecoxib (CELEBREX) 200 MG capsule One to 2 tablets by mouth daily as needed for pain. 12/21/21   Monica Becton, MD  ?Cyanocobalamin 1000 MCG/ML KIT Inject as directed every 30 (thirty) days.    [provider]  ?desvenlafaxine (PRISTIQ) 100 MG 24 hr tablet Take 1 tablet (100 mg total) by mouth daily. 10/28/21    Christen Butter, NP  ?hydrOXYzine (ATARAX) 10 MG tablet Take 1 tablet (10 mg total) by mouth at bedtime as needed. 04/03/22   Thresa Ross, MD  ?lisinopril-hydrochlorothiazide (ZESTORETIC) 10-12.5 MG tablet Take 1 tablet by mouth daily. 12/20/21   Christen Butter, NP  ?Magnesium 250 MG TABS Take 1 tablet by mouth daily.    [provider]  ?Multiple Vitamin (MULTI-VITAMIN) tablet Take 1 tablet by mouth daily.    [provider]  ?nystatin (MYCOSTATIN/NYSTOP) powder Apply 1 application topically 2 (two) times daily. 11/08/21   Christen Butter, NP  ?propranolol (INDERAL) 20 MG tablet TAKE 1 TABLET(20 MG) BY MOUTH DAILY AS NEEDED 02/03/22   Christen Butter, NP  ?temazepam (RESTORIL) 30 MG capsule Take 1 capsule (30 mg total) by mouth daily as needed. 12/13/21   Christen Butter, NP  ?traMADol (ULTRAM) 50 MG tablet Take 2 tablets (100 mg total) by mouth every 8 (eight) hours as needed for moderate pain. 03/06/22   Monica Becton, MD  ?Vitamin D, Ergocalciferol, (DRISDOL) 1.25 MG (50000 UNIT) CAPS capsule Take 1 capsule (50,000 Units total) by mouth once a week. 12/20/21   Christen Butter, NP  ? ? ?Family History ?Family History  ?Problem Relation Age of Onset  ? Hypertension Mother   ? Heart attack Mother   ? Lung cancer Father   ? Prostate cancer Father   ? Kidney cancer Brother   ? Healthy Son   ? Healthy Daughter   ? ? ?Social History ?Social History  ? ?Tobacco Use  ? Smoking status: Never  ?  Passive exposure: Past  ? Smokeless tobacco: Never  ?Vaping Use  ? Vaping Use: Never used  ?Substance Use Topics  ? Alcohol use: Yes  ?  Comment: rarely  ? Drug use: Never  ? ? ? ?Allergies   ?Compazine [prochlorperazine], Phenothiazines, Pregabalin, and Prednisone ? ? ?Review of Systems ?Review of Systems ?See HPI ? ?Physical Exam ?Triage Vital Signs ?ED Triage Vitals  ?Enc Vitals Group  ?   BP 04/03/22 1842 (!) 168/90  ?   Pulse Rate 04/03/22 1842 85  ?   Resp 04/03/22 1842 14  ?   Temp 04/03/22 1842 98.4 ?F (36.9 ?C)  ?   Temp  Source 04/03/22 1842 Oral  ?   SpO2 04/03/22 1842 96 %  ?   Weight --   ?   Height --   ?   Head Circumference --   ?   Peak Flow --   ?   Pain Score 04/03/22 1843 6  ?   Pain Loc --   ?   Pain Edu? --   ?   Excl. in GC? --   ? ?No data found. ? ?Updated Vital Signs ?BP (!) 168/90 (BP Location: Right Arm)   Pulse 85   Temp 98.4 ?F (36.9 ?C) (Oral)  Resp 14   SpO2 96%  ? ?   ? ?Physical Exam ?Constitutional:   ?   General: She is in acute distress.  ?   Appearance: She is well-developed. She is ill-appearing.  ?   Comments: Darkened room.  Uncomfortable  ?HENT:  ?   Head: Normocephalic and atraumatic.  ?Eyes:  ?   General: No visual field deficit. ?   Extraocular Movements:  ?   Right eye: Normal extraocular motion.  ?   Left eye: Normal extraocular motion.  ?   Conjunctiva/sclera: Conjunctivae normal.  ?   Pupils: Pupils are equal, round, and reactive to light.  ?   Comments: Injection of right conjunctive a  ?Cardiovascular:  ?   Rate and Rhythm: Normal rate and regular rhythm.  ?   Heart sounds: Normal heart sounds.  ?Pulmonary:  ?   Effort: Pulmonary effort is normal. No respiratory distress.  ?   Breath sounds: Normal breath sounds.  ?Abdominal:  ?   General: There is no distension.  ?   Palpations: Abdomen is soft.  ?Musculoskeletal:     ?   General: Normal range of motion.  ?   Cervical back: Normal range of motion. No rigidity.  ?Skin: ?   General: Skin is warm and dry.  ?Neurological:  ?   Mental Status: She is alert and oriented to person, place, and time.  ?   Cranial Nerves: No cranial nerve deficit, dysarthria or facial asymmetry.  ?   Gait: Gait normal.  ?Psychiatric:     ?   Mood and Affect: Mood normal.     ?   Behavior: Behavior normal.  ? ? ? ?UC Treatments / Results  ?Labs ?(all labs ordered are listed, but only abnormal results are displayed) ?Labs Reviewed - No data to display ? ?EKG ? ? ?Radiology ?No results found. ? ?Procedures ?Procedures (including critical care time) ? ?Medications  Ordered in UC ?Medications  ?ketorolac (TORADOL) 30 MG/ML injection 30 mg (has no administration in time range)  ?dexamethasone (DECADRON) injection 6 mg (has no administration in time range)  ?acetaminophen

## 2022-04-05 NOTE — Progress Notes (Signed)
?HPI with pertinent ROS:  ? ?CC:  HTN, skin tags ? ?HPI: ?Pleasant 71 year old female presenting today for the following: ? ?Hypertension ?Was seen on 5/8 at [facility] for a severe headache where she was found to have an elevated blood pressure. She was treated for the headache and advised to follow up with her PCP regarding the blood pressure elevation. She was also advised to get a BP cuff and monitor BP at home.  ?Medication: lisinopril-HCTZ 10-12.5mg  daily ?Compliant: Yes ?Side effects: None ?Checking BP at home: not yet, BP cuff ordered ?Low sodium diet: Yes ?Exercise: No ?Concerning symptoms: recent headaches ?Headaches ?History of migraines, recent headache requiring UC treatment. No headache since then.  ?Skin lesions ?Has several lesions that popped up overnight on her right shoulder and breast. Wanted to have them evaluated since they popped up so quickly.  ?Weight gain ?Has gained approximately 24 pounds in the last year.  Is worried about what she can do to get this off.  She notes that she does not eat the healthiest diet but usually tries to make better choices.  It is very hard when she is only cooking for herself.  She is not active due to the level of chronic pain. ? ?I reviewed the past medical history, family history, social history, surgical history, and allergies today and no changes were needed.  Please see the problem list section below in epic for further details. ? ?Physical exam:  ? ?General: Well Developed, well nourished, and in no acute distress.  ?Neuro: Alert and oriented x3.  ?HEENT: Normocephalic, atraumatic.  ?Skin: Warm and dry. ?Cardiac: Regular rate and rhythm, no murmurs rubs or gallops, no lower extremity edema.  ?Respiratory: Clear to auscultation bilaterally. Not using accessory muscles, speaking in full sentences. ? ?Impression and Recommendations:   ? ?1. Primary hypertension ?Checking labs today.  Blood pressure looks very good today at 123/81.  Advised to check her blood  pressure at home with her new blood pressure cuff at least once daily at different times of the day over the next 2 weeks.  Keep a log and bring with her to a nurse visit in 2 weeks.  Advised to bring her new blood pressure cuff with her to verify accuracy.  For now continue lisinopril-HCTZ 10-12.5 mg daily. ?- CBC with Differential/Platelet ?- COMPLETE METABOLIC PANEL WITH GFR ?- Lipid panel ? ?2. Migraine without status migrainosus, not intractable, unspecified migraine type ?Stable, intermittent.  No further concerns after urgent care visit. ? ?3. Nocturia more than twice per night ?Starting Ditropan 5 mg nightly at bedtime.  Recommend cutting back water intake at least 3 hours before bed. ? ?4. SK (seborrheic keratosis) ?Discussed the nature of seborrheic keratoses.  Offered cryotherapy but since these are benign and not concerning, she would like to hold off on treatment today.  Advised to monitor these for change in color, size, shape, and irregular borders. ? ?5. Preventative health care ?Checking labs today. ? ?6. Prediabetes ?Checking hemoglobin A1c. ?- Hemoglobin A1c ? ?7. Weight gain ?Checking TSH.  Discussed low impact exercises that may help with building muscle, losing weight, and chronic pain.  Recommend starting slow and easy with sometimes floating in the warm water at the pool and then gradually increase activity in the water since it is very low impact and low gravity. ?- TSH ? ?8. Arthralgia, unspecified joint ?Checking ESR and CRP. ?- Sed Rate (ESR) ?- C-reactive protein ? ?Return in about 2 weeks (around 04/20/2022) for nurse visit  for BP check. ?___________________________________________ ?Clearnce Sorrel, DNP, APRN, FNP-BC ?Primary Care and Sports Medicine ?[facility] ?

## 2022-04-06 ENCOUNTER — Ambulatory Visit (INDEPENDENT_AMBULATORY_CARE_PROVIDER_SITE_OTHER): Payer: Medicare Other | Admitting: Medical-Surgical

## 2022-04-06 ENCOUNTER — Encounter: Payer: Self-pay | Admitting: Medical-Surgical

## 2022-04-06 VITALS — BP 123/81 | HR 62 | Resp 20 | Ht 64.0 in | Wt 174.5 lb

## 2022-04-06 DIAGNOSIS — L821 Other seborrheic keratosis: Secondary | ICD-10-CM | POA: Diagnosis not present

## 2022-04-06 DIAGNOSIS — G43909 Migraine, unspecified, not intractable, without status migrainosus: Secondary | ICD-10-CM | POA: Diagnosis not present

## 2022-04-06 DIAGNOSIS — R351 Nocturia: Secondary | ICD-10-CM | POA: Diagnosis not present

## 2022-04-06 DIAGNOSIS — L918 Other hypertrophic disorders of the skin: Secondary | ICD-10-CM

## 2022-04-06 DIAGNOSIS — M2550 Pain in unspecified joint: Secondary | ICD-10-CM

## 2022-04-06 DIAGNOSIS — R635 Abnormal weight gain: Secondary | ICD-10-CM

## 2022-04-06 DIAGNOSIS — I10 Essential (primary) hypertension: Secondary | ICD-10-CM

## 2022-04-06 DIAGNOSIS — R7303 Prediabetes: Secondary | ICD-10-CM

## 2022-04-06 DIAGNOSIS — Z0000 Encounter for general adult medical examination without abnormal findings: Secondary | ICD-10-CM

## 2022-04-06 MED ORDER — OXYBUTYNIN CHLORIDE ER 5 MG PO TB24: 5.0000 mg | ORAL_TABLET | Freq: Every day | ORAL | 0 refills | Status: DC

## 2022-04-07 ENCOUNTER — Other Ambulatory Visit: Payer: Self-pay

## 2022-04-07 DIAGNOSIS — G4700 Insomnia, unspecified: Secondary | ICD-10-CM

## 2022-04-07 MED ORDER — TEMAZEPAM 30 MG PO CAPS: 30.0000 mg | ORAL_CAPSULE | Freq: Every day | ORAL | 2 refills | Status: DC | PRN

## 2022-04-10 ENCOUNTER — Ambulatory Visit (INDEPENDENT_AMBULATORY_CARE_PROVIDER_SITE_OTHER): Payer: Medicare Other | Admitting: Licensed Clinical Social Worker

## 2022-04-10 DIAGNOSIS — F4321 Adjustment disorder with depressed mood: Secondary | ICD-10-CM

## 2022-04-10 DIAGNOSIS — F332 Major depressive disorder, recurrent severe without psychotic features: Secondary | ICD-10-CM

## 2022-04-10 DIAGNOSIS — F411 Generalized anxiety disorder: Secondary | ICD-10-CM

## 2022-04-10 NOTE — Progress Notes (Signed)
In person ? ?THERAPIST PROGRESS NOTE ? ?Session Time: 10:00 AM to 10:53 AM ? ?Participation Level: Active ? ?Behavioral Response: CasualAlertAnxious and Dysphoric ? ?Type of Therapy: Individual Therapy ? ?Treatment Goals addressed:  Work on PTSD, depression, anxiety, grief ? ?ProgressTowards Goals: Progressing-patient experiencing significant symptoms of anxiety and depression and worked on Radiographer, therapeutic noted patient's efforts to apply coping skills but it is a work in progress ? ?Interventions: CBT, Solution Focused, Strength-based, Supportive, Reframing, and Other: Coping ? ?Summary: Heather Thompson is a 71 y.o. female who presents with pattern of stress and pain. Has lived with pain since old enough to remember what it was. Blood pressure elevated for past couple of years cycle of pain and stress impacts it. Went to ER got Toradol for pain and anti-inflammatory. That night nauseous and threw up at home. On Thursday blood pressure was fine. Chronic pain, worry a lot tough couple years, lots of stress, complicated medical issues. Spent time with her PCP and was validated and PCP was reassuring. Going to get a blood pressure monitor, try not to worry or stress. Doesn't know how to do that worries abut her health lives by herself doesn't want to be dependent on anyone. Knows shouldn't worry not going to make a difference takes away today's joy. How to stop the cycle. Book by Lorriane Shire. "[place] of the mind." Not helpful. Worry magnified now that on her own. Would rather not worry and stress. Don't know where going to live July 15. Probably end up where she is, but doesn't like the rent increase. Moving around since 2001 september where only moved three times with husband in almost 48 years life is unstable and doesn't like feeling that way. When do hobby diamond art helps people telling her in too much in head. In head a lot because a lot of stress. When out though doesn't usually have fun. Made good friends at  church. Was out of her head this past Sunday and  had a nice few hours home alone back in head worrying. Patient says legitimate stresses though. Would sit in swing in front and river going by peaceful. Her mission right now is look for a patio. Wants a house. Widow group on Wednesday and woman group on Sunday. Sat outside for three hours. Better than sitting in apartment. Thought has to be outside more. Would like to have place at her own house. Trying and working on it but patient says not working can't find a place.  ? ?Therapist focused on anxiety and worry patient has not strategies that would be helpful to decrease noted impact it has on body and how it becomes a cycle of pain and anxiety.  Noted getting out of her head into another mode of action helpful things such as spending time with friends, doing art work.  Therapist encouraged patient with strategy of working on having more fun in her life is helpful in adding activities she enjoys.  At the same time provided positive feedback for efforts patient is making she spent time outside, looking for a place that would have a patio that would be helpful for her.  Therapist encouraged patient with more action strategies to manage stress noting worry helpful to a point of having a plan for the future but often not helpful and only leads to more distress channel energy and to making plans that are helpful.  Therapist provided active listening open questions supportive interventions    ? ?Suicidal/Homicidal: No ? ?Plan: Return again  in 1 week.2.  Work on ruminating thoughts coping for anxiety and depression ? ?Diagnosis: major depressive disorder, recurrent, severe, generalized anxiety disorder ? ?Collaboration of Care: Other none needed ? ?Patient/Guardian was advised Release of Information must be obtained prior to any record release in order to collaborate their care with an outside provider. Patient/Guardian was advised if they have not already done so to  contact the registration department to sign all necessary forms in order for [place] to release information regarding their care.  ? ?Consent: Patient/Guardian gives verbal consent for treatment and assignment of benefits for services provided during this visit. Patient/Guardian expressed understanding and agreed to proceed.  ? ?Cordella Register, LCSW ?04/10/2022 ? ?

## 2022-04-11 ENCOUNTER — Inpatient Hospital Stay: Payer: Medicare Other | Attending: Hematology & Oncology

## 2022-04-11 ENCOUNTER — Inpatient Hospital Stay: Payer: Medicare Other

## 2022-04-11 ENCOUNTER — Encounter: Payer: Self-pay | Admitting: Medical-Surgical

## 2022-04-11 VITALS — BP 144/50 | HR 82 | Temp 98.5°F | Resp 18

## 2022-04-11 DIAGNOSIS — Z95828 Presence of other vascular implants and grafts: Secondary | ICD-10-CM

## 2022-04-11 DIAGNOSIS — Z452 Encounter for adjustment and management of vascular access device: Secondary | ICD-10-CM | POA: Insufficient documentation

## 2022-04-11 DIAGNOSIS — D508 Other iron deficiency anemias: Secondary | ICD-10-CM | POA: Insufficient documentation

## 2022-04-11 DIAGNOSIS — K909 Intestinal malabsorption, unspecified: Secondary | ICD-10-CM | POA: Diagnosis present

## 2022-04-11 DIAGNOSIS — E782 Mixed hyperlipidemia: Secondary | ICD-10-CM

## 2022-04-11 DIAGNOSIS — D509 Iron deficiency anemia, unspecified: Secondary | ICD-10-CM

## 2022-04-11 LAB — CBC WITH DIFFERENTIAL/PLATELET
Absolute Monocytes: 465 cells/uL (ref 200–950)
Basophils Absolute: 28 cells/uL (ref 0–200)
Basophils Relative: 0.5 %
Eosinophils Absolute: 190 cells/uL (ref 15–500)
Eosinophils Relative: 3.4 %
HCT: 39.5 % (ref 35.0–45.0)
Hemoglobin: 13 g/dL (ref 11.7–15.5)
Lymphs Abs: 2363 cells/uL (ref 850–3900)
MCH: 30.7 pg (ref 27.0–33.0)
MCHC: 32.9 g/dL (ref 32.0–36.0)
MCV: 93.2 fL (ref 80.0–100.0)
MPV: 10.1 fL (ref 7.5–12.5)
Monocytes Relative: 8.3 %
Neutro Abs: 2554 cells/uL (ref 1500–7800)
Neutrophils Relative %: 45.6 %
Platelets: 293 10*3/uL (ref 140–400)
RBC: 4.24 10*6/uL (ref 3.80–5.10)
RDW: 11.8 % (ref 11.0–15.0)
Total Lymphocyte: 42.2 %
WBC: 5.6 10*3/uL (ref 3.8–10.8)

## 2022-04-11 LAB — IRON AND IRON BINDING CAPACITY (CC-WL,HP ONLY)
Iron: 101 ug/dL (ref 28–170)
Saturation Ratios: 26 % (ref 10.4–31.8)
TIBC: 384 ug/dL (ref 250–450)
UIBC: 283 ug/dL (ref 148–442)

## 2022-04-11 LAB — FERRITIN: Ferritin: 171 ng/mL (ref 11–307)

## 2022-04-11 LAB — COMPLETE METABOLIC PANEL WITH GFR
AG Ratio: 1.5 (calc) (ref 1.0–2.5)
ALT: 22 U/L (ref 6–29)
AST: 22 U/L (ref 10–35)
Albumin: 4.1 g/dL (ref 3.6–5.1)
Alkaline phosphatase (APISO): 81 U/L (ref 37–153)
BUN: 13 mg/dL (ref 7–25)
CO2: 31 mmol/L (ref 20–32)
Calcium: 9.5 mg/dL (ref 8.6–10.4)
Chloride: 97 mmol/L — ABNORMAL LOW (ref 98–110)
Creat: 0.65 mg/dL (ref 0.60–1.00)
Globulin: 2.8 g/dL (calc) (ref 1.9–3.7)
Glucose, Bld: 95 mg/dL (ref 65–99)
Potassium: 4.5 mmol/L (ref 3.5–5.3)
Sodium: 136 mmol/L (ref 135–146)
Total Bilirubin: 0.4 mg/dL (ref 0.2–1.2)
Total Protein: 6.9 g/dL (ref 6.1–8.1)
eGFR: 94 mL/min/{1.73_m2} (ref >=60)

## 2022-04-11 LAB — C-REACTIVE PROTEIN: CRP: 0.3 mg/L (ref <8.0)

## 2022-04-11 LAB — T4, FREE: Free T4: 1.2 ng/dL (ref 0.8–1.8)

## 2022-04-11 LAB — CBC WITH DIFFERENTIAL (CANCER CENTER ONLY)
Abs Immature Granulocytes: 0.01 10*3/uL (ref 0.00–0.07)
Basophils Absolute: 0 10*3/uL (ref 0.0–0.1)
Basophils Relative: 1 %
Eosinophils Absolute: 0.2 10*3/uL (ref 0.0–0.5)
Eosinophils Relative: 5 %
HCT: 36.3 % (ref 36.0–46.0)
Hemoglobin: 12 g/dL (ref 12.0–15.0)
Immature Granulocytes: 0 %
Lymphocytes Relative: 28 %
Lymphs Abs: 1.3 10*3/uL (ref 0.7–4.0)
MCH: 30.7 pg (ref 26.0–34.0)
MCHC: 33.1 g/dL (ref 30.0–36.0)
MCV: 92.8 fL (ref 80.0–100.0)
Monocytes Absolute: 0.3 10*3/uL (ref 0.1–1.0)
Monocytes Relative: 7 %
Neutro Abs: 2.8 10*3/uL (ref 1.7–7.7)
Neutrophils Relative %: 59 %
Platelet Count: 202 10*3/uL (ref 150–400)
RBC: 3.91 MIL/uL (ref 3.87–5.11)
RDW: 12.2 % (ref 11.5–15.5)
WBC Count: 4.6 10*3/uL (ref 4.0–10.5)
nRBC: 0 % (ref 0.0–0.2)

## 2022-04-11 LAB — LIPID PANEL
Cholesterol: 246 mg/dL — ABNORMAL HIGH (ref <200)
HDL: 91 mg/dL (ref >=50)
LDL Cholesterol (Calc): 133 mg/dL (calc) — ABNORMAL HIGH
Non-HDL Cholesterol (Calc): 155 mg/dL (calc) — ABNORMAL HIGH (ref <130)
Total CHOL/HDL Ratio: 2.7 (calc) (ref <5.0)
Triglycerides: 109 mg/dL (ref <150)

## 2022-04-11 LAB — RETICULOCYTES
Immature Retic Fract: 6.9 % (ref 2.3–15.9)
RBC.: 3.88 MIL/uL (ref 3.87–5.11)
Retic Count, Absolute: 52 10*3/uL (ref 19.0–186.0)
Retic Ct Pct: 1.3 % (ref 0.4–3.1)

## 2022-04-11 LAB — TSH: TSH: 5.29 mIU/L — ABNORMAL HIGH (ref 0.40–4.50)

## 2022-04-11 LAB — HEMOGLOBIN A1C
Hgb A1c MFr Bld: 6 % of total Hgb — ABNORMAL HIGH (ref <5.7)
Mean Plasma Glucose: 126 mg/dL
eAG (mmol/L): 7 mmol/L

## 2022-04-11 LAB — SEDIMENTATION RATE: Sed Rate: 11 mm/h (ref 0–30)

## 2022-04-11 MED ORDER — SODIUM CHLORIDE 0.9% FLUSH
10.0000 mL | INTRAVENOUS | Status: DC | PRN
  Administered 2022-04-11: 10 mL via INTRAVENOUS

## 2022-04-11 MED ORDER — HEPARIN SOD (PORK) LOCK FLUSH 100 UNIT/ML IV SOLN
500.0000 [IU] | Freq: Once | INTRAVENOUS | Status: AC
  Administered 2022-04-11: 500 [IU] via INTRAVENOUS

## 2022-04-11 NOTE — Patient Instructions (Signed)

## 2022-04-12 MED ORDER — PRAVASTATIN SODIUM 10 MG PO TABS: 10.0000 mg | ORAL_TABLET | Freq: Every day | ORAL | 0 refills | Status: DC

## 2022-04-17 ENCOUNTER — Ambulatory Visit (INDEPENDENT_AMBULATORY_CARE_PROVIDER_SITE_OTHER): Payer: Medicare Other | Admitting: Licensed Clinical Social Worker

## 2022-04-17 ENCOUNTER — Ambulatory Visit (INDEPENDENT_AMBULATORY_CARE_PROVIDER_SITE_OTHER): Payer: Medicare Other | Admitting: Sports Medicine

## 2022-04-17 ENCOUNTER — Other Ambulatory Visit: Payer: Self-pay

## 2022-04-17 DIAGNOSIS — F4321 Adjustment disorder with depressed mood: Secondary | ICD-10-CM | POA: Diagnosis not present

## 2022-04-17 DIAGNOSIS — M170 Bilateral primary osteoarthritis of knee: Secondary | ICD-10-CM | POA: Diagnosis not present

## 2022-04-17 DIAGNOSIS — F411 Generalized anxiety disorder: Secondary | ICD-10-CM | POA: Diagnosis not present

## 2022-04-17 DIAGNOSIS — M180 Bilateral primary osteoarthritis of first carpometacarpal joints: Secondary | ICD-10-CM

## 2022-04-17 DIAGNOSIS — M19041 Primary osteoarthritis, right hand: Secondary | ICD-10-CM

## 2022-04-17 DIAGNOSIS — F332 Major depressive disorder, recurrent severe without psychotic features: Secondary | ICD-10-CM

## 2022-04-17 DIAGNOSIS — M19042 Primary osteoarthritis, left hand: Secondary | ICD-10-CM

## 2022-04-17 MED ORDER — TRAMADOL HCL 50 MG PO TABS: 50.0000 mg | ORAL_TABLET | Freq: Three times a day (TID) | ORAL | 0 refills | Status: DC | PRN

## 2022-04-17 MED ORDER — NYSTATIN 100000 UNIT/GM EX POWD: 1.0000 "application " | Freq: Two times a day (BID) | CUTANEOUS | 2 refills | Status: DC

## 2022-04-17 NOTE — Progress Notes (Signed)
This was last filled 11/08/2021

## 2022-04-17 NOTE — Progress Notes (Signed)
    Procedures performed today:    None.  Independent interpretation of notes and tests performed by another provider:   None.  Brief History, Exam, Impression, and Recommendations:    Primary osteoarthritis of both first carpometacarpal joints Pleasant 71 year old female, bilateral CMC arthritis that did not respond to injections, she had a visit with Dr. Melvyn Novas, she is getting get a second opinion from Dr. Ronne Binning. I think this is completely acceptable, I will bump her tramadol up to 3 times daily in the meantime.  Chronic process with exacerbation and pharmacologic intervention  ___________________________________________ Ihor Austin. Benjamin Stain, M.D., ABFM., CAQSM. Primary Care and Sports Medicine [facility]  Adjunct Instructor of Family Medicine  [employer]

## 2022-04-17 NOTE — Progress Notes (Signed)
THERAPIST PROGRESS NOTE  Session Time: 10:00 AM to 10:50 AM  Participation Level: Active  Behavioral Response: CasualAlertappropriate  Type of Therapy: Individual Therapy  Treatment Goals addressed: Work on PTSD, depression, anxiety, grief  ProgressTowards Goals: Progressing-therapist assesses growth on patient's part as we worked on getting her needs met and self-esteem and how this directly impacts mental health symptoms  Interventions: Solution Focused, Strength-based, Supportive, and Other: Self-esteem  Summary: Heather Thompson is a 71 y.o. female who presents with always been people pleaser other people come first, patient is the low man on totem poll, goes back to self-esteem. Parents didn't want any of the kids. At 15 still feel guilty when starting to put her needs first. Trying to be ok last couple of years crap and managed to get through it. Now trying to put herself first. Can't please everybody all the time, can't say yes all the time and not do what she wants for her. Talked in session not putting her needs first not doing what she needs for herself, not taking care of herself.  Patient says her approaches to keep the peace because going up lived with turmoil keeping peace and other people happy to keep peace and wants and needs neglected. Feel getting better with addressing her needs but still has some guilt about it. Realizing don't have forever now realizes it at her age. Applied people pleasing to relationship to kids. Decided go see daughter get a third  black belt quick weekend, Nicki Reaper, her son, isn't going to know there upset she comes from black belt and not his daughter's graduation.  Discussed her relationship with son its always been his needs first.  Discussed communicating with him the she is they are will just make him upset she did not come for the graduation and cannot figure out transportation to do both.  Plan on going up later in summer, have to have hand surgery. Grown  to the point where putting her needs first. Lynann Bologna would say go to black belt ceremony. Scott always been selfish always him first. Important to go to [facility] and Lynann Bologna will be there in spirt. Explored pros and cons of being here and happy here, people come in her life and feels where meant to be. Can't fix [place] and Scott relationship Scott always entitled. Wants to please son never please him explored how that would impact patient.  A set up to feel bad.  Shared he did something for friend didn't make time to visit patient now wants her to go out of way for graduation but can't go out of way to see patient. Where does patient fit it patient says that she "didn't matter". Trying to be ok not going. Still feel guilty. It is what Lynann Bologna wanted.    Therapist work with patient on what therapist sees his growth and patient does to of growth and self-esteem to not trying to be people pleaser.  Processed feelings related to how it plays out with family patient herself sees that it is impossible in her relationship to please son and noting how continuing to try to do this will lead to her feeling bad.  Noted and possibility of being able to please other people, noting as well a step toward more self-esteem is taking care of her needs.  Therapist noted this is important in being a healthy individual as well as building self-esteem.  That adults have to prioritize their needs noted at this sets her in a positive path for  creating the life she wants for self, that will have a positive impact on mental health and wellbeing.  Noted if we try to please others were never living for ourselves and actually mental health issues come in to play when were not taking care of her own needs, that we have to recognize that as a priority as adults to take care of ourselves.  Therapist provided support and space to patient as she shared her thoughts and feelings in session  Suicidal/Homicidal: No  Plan: Return again in 1 week.2.Work on  ruminating thoughts coping for anxiety and depression, steps to strengthen self-esteem such as getting her needs met Diagnosis: major depressive disorder, recurrent, severe, generalized anxiety disorder  Collaboration of Care: Other none needed  Patient/Guardian was advised Release of Information must be obtained prior to any record release in order to collaborate their care with an outside provider. Patient/Guardian was advised if they have not already done so to contact the registration department to sign all necessary forms in order for [place] to release information regarding their care.   Consent: Patient/Guardian gives verbal consent for treatment and assignment of benefits for services provided during this visit. Patient/Guardian expressed understanding and agreed to proceed.   Cordella Register, LCSW 04/17/2022

## 2022-04-17 NOTE — Assessment & Plan Note (Signed)
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 Melvyn Novas, she is getting get a second opinion from Dr. Ronne Binning. I think this is completely acceptable, I will bump her tramadol up to 3 times daily in the meantime.

## 2022-04-20 ENCOUNTER — Ambulatory Visit (INDEPENDENT_AMBULATORY_CARE_PROVIDER_SITE_OTHER): Payer: Medicare Other | Admitting: Medical-Surgical

## 2022-04-20 ENCOUNTER — Encounter (HOSPITAL_COMMUNITY): Payer: Self-pay | Admitting: Psychiatry

## 2022-04-20 ENCOUNTER — Ambulatory Visit (INDEPENDENT_AMBULATORY_CARE_PROVIDER_SITE_OTHER): Payer: Medicare Other | Admitting: Psychiatry

## 2022-04-20 VITALS — BP 140/74 | HR 89 | Temp 97.7°F | Ht 65.5 in | Wt 175.0 lb

## 2022-04-20 VITALS — BP 133/62 | HR 57

## 2022-04-20 DIAGNOSIS — F411 Generalized anxiety disorder: Secondary | ICD-10-CM | POA: Diagnosis not present

## 2022-04-20 DIAGNOSIS — F5102 Adjustment insomnia: Secondary | ICD-10-CM

## 2022-04-20 DIAGNOSIS — I10 Essential (primary) hypertension: Secondary | ICD-10-CM

## 2022-04-20 DIAGNOSIS — F4321 Adjustment disorder with depressed mood: Secondary | ICD-10-CM | POA: Diagnosis not present

## 2022-04-20 DIAGNOSIS — F332 Major depressive disorder, recurrent severe without psychotic features: Secondary | ICD-10-CM

## 2022-04-20 MED ORDER — HYDROXYZINE HCL 10 MG PO TABS: 10.0000 mg | ORAL_TABLET | Freq: Every evening | ORAL | 0 refills | Status: DC | PRN

## 2022-04-20 MED ORDER — VALSARTAN-HYDROCHLOROTHIAZIDE 80-12.5 MG PO TABS: 1.0000 | ORAL_TABLET | Freq: Every day | ORAL | 0 refills | Status: DC

## 2022-04-20 NOTE — Progress Notes (Signed)
Patient comes in today for blood pressure check.   Emie Rodak is taking Losartan HCTZ 10-12.5 mg daily for blood pressure control. She denies any missed doses, side effects, headaches, chest pain, palpitations, dizziness, or shortness of breath.   Ania Morrell has been checking blood pressure readings at home. She just received her cuff on 04/16/2022. She did not bring her cuff for comparison today. Her home readings are as follows:  04/16/2022  9 pm 149/83 04/17/2022  3 pm 145/81 04/18/2022  6 pm 143/83 04/19/2022  10 am 137/79  Her first blood pressure reading today is: 142/56. After sitting, her second reading is: 133/62.   Patient also mentions a "throat clearing cough" she has had for years. She has blamed this on allergies, but no OTC allergy medications have ever helped this. She was recently speaking with a nurse friend who mentioned that this could be caused by her Lisinopril. Patient has never brought this up to a provider before.   I spoke with Christen Butter who advised for patient to switch from Lisinopril HCTZ to Valsartan HCTZ. Medication sent to the pharmacy. Patient to follow up in two weeks for repeat blood pressure check.

## 2022-04-20 NOTE — Patient Instructions (Signed)
STOP Lisinopril-HCTZ   Start Valsartan-HCTZ 80-12.5 mg daily   Return in 2 weeks for repeat nurse visit.

## 2022-04-20 NOTE — Progress Notes (Signed)
Agree with documentation as below.  ___________________________________________ Dejuana Weist L. Rashonda Warrior, DNP, APRN, FNP-BC Primary Care and Sports Medicine [facility] [place]  
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
## 2022-05-01 ENCOUNTER — Ambulatory Visit: Payer: Medicare Other

## 2022-05-01 ENCOUNTER — Ambulatory Visit (HOSPITAL_COMMUNITY): Payer: Medicare Other | Admitting: Licensed Clinical Social Worker

## 2022-05-09 ENCOUNTER — Ambulatory Visit (INDEPENDENT_AMBULATORY_CARE_PROVIDER_SITE_OTHER): Payer: Medicare Other | Admitting: Medical-Surgical

## 2022-05-09 VITALS — BP 139/58 | HR 68

## 2022-05-09 DIAGNOSIS — E538 Deficiency of other specified B group vitamins: Secondary | ICD-10-CM

## 2022-05-09 MED ORDER — CYANOCOBALAMIN 1000 MCG/ML IJ SOLN
1000.0000 ug | Freq: Once | INTRAMUSCULAR | Status: AC
  Administered 2022-05-09: 1000 ug via INTRAMUSCULAR

## 2022-05-09 NOTE — Progress Notes (Signed)
Home blood pressures are not at goal however readings here today look okay. Since it was not noted if her home monitor was verified as accurate, continue current medication at current doses.   Regarding frequent throat clearing, consider possible laryngopharyngeal reflux. Could trial Protonix once daily over the next couple of weeks to see if there is an improvement in her symptoms.  ___________________________________________ Thayer Ohm, DNP, APRN, FNP-BC Primary Care and Sports Medicine [facility] [place]

## 2022-05-09 NOTE — Progress Notes (Signed)
Pt here for nurse BP check.  Pt denies CP, SOB, headaches, dizziness or missed doses of medications.  Pt brought her own BP machine for comparison.  First reading was 140/77 and second reading was 133/78.  Pt also brought in her home readings:  04/21/22:  154/77 04/22/22:  157/81 04/23/22:  162/79 04/24/22:  148/79 04/25/22:  154/80 04/26/22:  138/74 04/27/22:    151/74 04/29/22     148/78 05/02/22:    176/99  Pt also states that since switching to valsartan, her problem with throat clearing has not improved.  She states that it is intermittent where she does it all day and then other days she doesn't do it at all.  Charyl Bigger, CMA

## 2022-05-10 ENCOUNTER — Other Ambulatory Visit: Payer: Self-pay | Admitting: Medical-Surgical

## 2022-05-10 MED ORDER — PANTOPRAZOLE SODIUM 40 MG PO TBEC: 40.0000 mg | DELAYED_RELEASE_TABLET | Freq: Every day | ORAL | 3 refills | Status: DC

## 2022-05-10 NOTE — Progress Notes (Signed)
Pt informed to continue current blood pressure medications at their current doses.  Blood pressure readings with pt's machine are noted in the visit notes.  Pt is agreeable to trying protonix.  Needs RX.  Charyl Bigger, CMA

## 2022-05-10 NOTE — Progress Notes (Signed)
Protonix Rx sent to pharmacy on file.  ___________________________________________ Thayer Ohm, DNP, APRN, FNP-BC Primary Care and Sports Medicine [facility] [place]

## 2022-05-10 NOTE — Addendum Note (Signed)
Addended byChristen Butter on: 05/10/2022 12:52 PM   Modules accepted: Orders

## 2022-05-12 ENCOUNTER — Other Ambulatory Visit: Payer: Self-pay

## 2022-05-12 MED ORDER — OXYBUTYNIN CHLORIDE ER 5 MG PO TB24: ORAL_TABLET | ORAL | 0 refills | Status: DC

## 2022-05-15 ENCOUNTER — Other Ambulatory Visit: Payer: Self-pay | Admitting: Medical-Surgical

## 2022-05-24 ENCOUNTER — Other Ambulatory Visit: Payer: Self-pay

## 2022-05-24 DIAGNOSIS — M19041 Primary osteoarthritis, right hand: Secondary | ICD-10-CM

## 2022-05-24 DIAGNOSIS — M170 Bilateral primary osteoarthritis of knee: Secondary | ICD-10-CM

## 2022-05-24 DIAGNOSIS — M180 Bilateral primary osteoarthritis of first carpometacarpal joints: Secondary | ICD-10-CM

## 2022-05-25 MED ORDER — TRAMADOL HCL 50 MG PO TABS: 50.0000 mg | ORAL_TABLET | Freq: Three times a day (TID) | ORAL | 0 refills | Status: DC | PRN

## 2022-05-26 ENCOUNTER — Ambulatory Visit (HOSPITAL_COMMUNITY): Payer: Medicare Other | Admitting: Licensed Clinical Social Worker

## 2022-05-26 ENCOUNTER — Ambulatory Visit: Payer: Medicare Other | Admitting: Medical-Surgical

## 2022-05-31 ENCOUNTER — Ambulatory Visit (INDEPENDENT_AMBULATORY_CARE_PROVIDER_SITE_OTHER): Payer: Medicare Other | Admitting: Licensed Clinical Social Worker

## 2022-05-31 DIAGNOSIS — F4321 Adjustment disorder with depressed mood: Secondary | ICD-10-CM | POA: Diagnosis not present

## 2022-05-31 DIAGNOSIS — F332 Major depressive disorder, recurrent severe without psychotic features: Secondary | ICD-10-CM | POA: Diagnosis not present

## 2022-05-31 DIAGNOSIS — F411 Generalized anxiety disorder: Secondary | ICD-10-CM | POA: Diagnosis not present

## 2022-05-31 NOTE — Progress Notes (Signed)
Virtual Visit via Video Note  I connected with Deslyn Califano on 05/31/22 at  3:00 PM EDT by a video enabled telemedicine application and verified that I am speaking with the correct person using two identifiers.  Attempted to connect virtually ran into technical difficulties so more than 50% session was by phone Location: Patient: home Provider: home office   I discussed the limitations of evaluation and management by telemedicine and the availability of in person appointments. The patient expressed understanding and agreed to proceed.  I discussed the assessment and treatment plan with the patient. The patient was provided an opportunity to ask questions and all were answered. The patient agreed with the plan and demonstrated an understanding of the instructions.   The patient was advised to call back or seek an in-person evaluation if the symptoms worsen or if the condition fails to improve as anticipated.  I provided 53 minutes of non-face-to-face time during this encounter.  THERAPIST PROGRESS NOTE  Session Time: 3:00 PM to 3:53 PM  Participation Level: Active  Behavioral Response: CasualAlertAnxious and Depressed  Type of Therapy: Individual Therapy  Treatment Goals addressed: Work on PTSD, depression, anxiety, grief  ProgressTowards Goals: Progressing-reports severe symptoms but reported his progress because patient is using sessions to help find ways to cope therapist providing perspective for patient to understand process of transitioning to a new life can be challenging and take longer than she thinks  Interventions: Solution Focused, Strength-based, Supportive, Reframing, and Other: Grief, coping  Summary: Janique Hoefer is a 71 y.o. female who presents with since saw therapist had right hand operated on some joint replacement and carpel tunnel. Chose to do the right first because left handed. Still wearing molded brace on right hand. Left one still needs doing. Moved over the  weekend feels like her life crazy coming and going. Found out months ago rent was going up. Name in [place] apartment more in town. Called week after hand surgery. They needed her to take apartment so got apartment the middle of June to start to move things over. One hand to work with and sister one hand know your friends are. Out of [address] into [employer]. For people 36 or older cheaper rent. Two bedrooms and 11/2 baths good though rooms are smaller. Older people want to gossip outside patient doesn't have time for that and don't get involved with that. Tells them in a hurry got to go. Doesn't want them to know about history and don't want to know theirs. Patient describes having wicked ups and downs. Saturday was her birthday was a work day not any kind of birthday. Sunday friend and sister took her out. It was a nice day. Can't stand the chaos of living with boxes. Has been staying up all night just getting apartment settled can't chaos makes anxiety go through the roof. Saturday birthday down [name] and kids not there. They all called her but didn't see them. Tradition on the 4th of July there was no 4th July for her. Bothered that all old traditions gone and "is killing her". [name]'s birthday Monday the 10th two years gone. Anniversary of his passing on the 11th. Daughter was so surprised when showed up getting her black belt. Nice weekend. Hospice asked about CBT treatment recommends doing it. Saw her on Monday and in a bad place because of things mentioned. Going to see her one more time. Only supposed to see her for a year. Because of anniversary's could extend it. Supposed to be better  after first year 2nd year don't feel any better with grief and lose. Respite worker thought patient in severe depressive state and wanted her to call doctor. Next day holiday and what he was going to do? Not suicidal and not hospitalized new apartment and feeling overwhelmed and alone no tradition and family in [place] and she is here. Whole life has changed and nothing the same. Overwhelming at a drop of hat terrible meltdowns. Waiting for right to heal to get left done. Left in pain and also over using it. Nothing is the same why crying. Another new apartment not having birthday no 4th of July, anniversaries for husband coming up. New apartment and feels so overwhelmed. 2019 life torn upset down. Everything wrong since then. Still 2023 still feel unsettled some days can't settle enough to concentrate on what supposed to be doing. Overwhelmed can't accomplish anything. Want old life back and not possible. Don't know how to adjust be happy again. Doesn't have hope for the future 4 years life been torn up, so much has happened so much gone wrong why hope asks what there is to turn around and be happy and smiling. So medically wrong going to get fix not sure if going to get fix. Afraid of the future. Feels end up in wheelchair nursing home. Daughter tell her catastrophe. All she is do is focus on future. All do is worrying about it. Not doing well with depression, going through the emotions don't feel living. Morning traditions that are gone, grand children grown up and don't need her don't feel purpose. Nobody cares anymore their life is there and she is here. Life drastically and their life is going on like was. Things have to get better doesn't like where she is at.   Patient really struggling about how all her life is changed therapist working with patient that a lot of what she went through has been really tough and recognize more the process this may take particularly in her case where she was married so long and had so much happen to her in the last few years.  Therapist validated patient how she was feeling but also pointing out the process can take time and can be different for everyone so no other people can struggle like this to she is not so unique.  This provided guideline for grief which she cited option be  the book about a woman who lost her husband and shared the process becomes where all of a sudden you start to see even if it is for moments that you can be happy and laugh and then these moments build but they will happen when they happen therapist provided guideline that she can bring forward traditions and also make new traditions for self as she creates a new life but it is difficult this transition so more acceptance more awareness of her process therapist assesses as helpful.  Patient shares went to grief therapist who noted patient is in severe depression and therapist said looking at history clinically really has been tumultuous and difficult for her has been up and down but has not really changed for a while and were still working on transitioning, working to get to relief of symptoms.  Patient mentioning CBT will integrate this into treatment as well.  Therapist noted patient may feel overwhelmed but shared her opinion patient very resourceful more so than many people that she works with.  Work with patient on focusing on the future explaining not helpful  only because to stress more helpful to focus on problem solving and if it is something on solvable to let the worry go focused on something else.  Therapist noted helpful for patient to be more aware of when her mind is doing this but also recognize not helpful.  Therapist encouraged patient to be more present focused where she is currently living to experience moments more.  Therapist provided strength and support for patient to talk about thoughts and feelings in session.  Suicidal/Homicidal: No  Plan: Return again in 1 weeks.2.  Look at happiness [facility] and reconnect a moment, mindfulness, look at book option B, CBT for depression  Diagnosis: major depressive disorder, recurrent, severe, generalized anxiety disorder  Collaboration of Care: Other none needed  Patient/Guardian was advised Release of Information must be obtained prior to any record  release in order to collaborate their care with an outside provider. Patient/Guardian was advised if they have not already done so to contact the registration department to sign all necessary forms in order for [place] to release information regarding their care.   Consent: Patient/Guardian gives verbal consent for treatment and assignment of benefits for services provided during this visit. Patient/Guardian expressed understanding and agreed to proceed.   Coolidge Breeze, LCSW 05/31/2022

## 2022-06-05 ENCOUNTER — Ambulatory Visit (INDEPENDENT_AMBULATORY_CARE_PROVIDER_SITE_OTHER): Payer: Medicare Other | Admitting: Licensed Clinical Social Worker

## 2022-06-05 ENCOUNTER — Encounter: Payer: Self-pay | Admitting: Family Medicine

## 2022-06-05 ENCOUNTER — Ambulatory Visit (INDEPENDENT_AMBULATORY_CARE_PROVIDER_SITE_OTHER): Payer: Medicare Other | Admitting: Family Medicine

## 2022-06-05 VITALS — BP 135/56 | HR 62 | Ht 64.0 in | Wt 177.0 lb

## 2022-06-05 DIAGNOSIS — J392 Other diseases of pharynx: Secondary | ICD-10-CM

## 2022-06-05 DIAGNOSIS — F332 Major depressive disorder, recurrent severe without psychotic features: Secondary | ICD-10-CM

## 2022-06-05 DIAGNOSIS — E538 Deficiency of other specified B group vitamins: Secondary | ICD-10-CM | POA: Diagnosis not present

## 2022-06-05 DIAGNOSIS — R0989 Other specified symptoms and signs involving the circulatory and respiratory systems: Secondary | ICD-10-CM | POA: Diagnosis not present

## 2022-06-05 DIAGNOSIS — F411 Generalized anxiety disorder: Secondary | ICD-10-CM | POA: Diagnosis not present

## 2022-06-05 DIAGNOSIS — F4321 Adjustment disorder with depressed mood: Secondary | ICD-10-CM

## 2022-06-05 MED ORDER — CYANOCOBALAMIN 1000 MCG/ML IJ SOLN
1000.0000 ug | Freq: Once | INTRAMUSCULAR | Status: AC
  Administered 2022-06-05: 1000 ug via INTRAMUSCULAR

## 2022-06-05 NOTE — Progress Notes (Signed)
Acute Office Visit  Subjective:     Patient ID: Heather Thompson, female    DOB: [birth year], 71 y.o.   MRN: [id number]  Chief Complaint  Patient presents with   throat clearing   B12 Injection    HPI Patient is in today for throat clearing issue.  Started years ago. Changed recently to from ACE to ARB. Started on PPI as well but no improvement.  History of hiatal hernia.  Getting hoarse throught the day. Getting worse as day moves on.  No throat pain.    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 No tx at that time.  She has also had prior history of immunotherapy treatments for allergies about 4 5 years ago and has tried multiple over-the-counter allergy medications recently without any relief.  Claims nasal steroid sprays and antihistamines.  She completely cut soda out several years ago.  She also complains of a persistently dry throat.  ROS      Objective:    BP (!) 135/56   Pulse 62   Ht 5\' 4"  (1.626 m)   Wt 177 lb (80.3 kg)   SpO2 100%   BMI 30.38 kg/m    Physical Exam Constitutional:      Appearance: She is well-developed.  HENT:     Head: Normocephalic and atraumatic.     Right Ear: External ear normal.     Left Ear: External ear normal.     Nose: Nose normal.  Eyes:     Conjunctiva/sclera: Conjunctivae normal.     Pupils: Pupils are equal, round, and reactive to light.  Neck:     Thyroid: No thyromegaly.  Cardiovascular:     Rate and Rhythm: Normal rate and regular rhythm.     Heart sounds: Normal heart sounds.  Pulmonary:     Effort: Pulmonary effort is normal.     Breath sounds: Normal breath sounds. No wheezing.  Musculoskeletal:     Cervical back: Neck supple.  Lymphadenopathy:     Cervical: No cervical adenopathy.  Skin:    General: Skin is warm and dry.  Neurological:     Mental Status: She is alert and oriented to person, place, and time.     No results found for any visits on 06/05/22.      Assessment &  Plan:   Problem List Items Addressed This Visit   None Visit Diagnoses     B12 deficiency    -  Primary   Relevant Medications   cyanocobalamin ((VITAMIN B-12)) injection 1,000 mcg (Completed)   Throat clearing       Relevant Orders   Ambulatory referral to ENT   Dry throat          Chronic throat clearing-we discussed referral to ENT for further work-up at this point since she has tried antihistamines, PPIs nasal steroid sprays without significant relief.  I think they could be helpful in at least evaluating the vocal cords.  Dry throat-discussed potential causes including her diuretic and her oxybutynin.  She can work with her PCP on possibly adjusting this medications one of the time to see if it may be contributing to some of her dry throat.  Meds ordered this encounter  Medications   cyanocobalamin ((VITAMIN B-12)) injection 1,000 mcg    No follow-ups on file.  Nani Gasser, MD

## 2022-06-05 NOTE — Progress Notes (Signed)
THERAPIST PROGRESS NOTE  Session Time: 10:04 AM to 11:00 AM  Participation Level: Active  Behavioral Response: CasualAlertDepressedtearful  Type of Therapy: Individual Therapy  Treatment Goals addressed: Work on PTSD, depression, anxiety, grief  ProgressTowards Goals: Progressing-has severe symptoms of depression but therapy helpful for patient being provided with space and support to talk about events leading to and after husband died help her with anniversary of his birthday and grieving  Interventions: Solution Focused, Strength-based, Supportive, and Other: grieving  Summary: Heather Thompson is a 71 y.o. female who presents with today is husband's birthday. Wants to get through today not a good day. Next month the 11th second anniversary of his passing. Just want them to be over with. Shared about last years that he was healthy except diabetes until November woke up and in the middle of night and was having a heart attack 2019 when everything started going downhill before everything normal. Moved to [place]. Shared memories of that time that he was declining rapidly.  Had to tell him that there was a mass on his brain. Promised would not leave and did, can't forgive herself.  Stepped away to get a COVID test because coughing therapist feedback was that was a reasonable decision anybody would have made. People said service was beautiful patient pick the songs were at the eulogy's pick the prescription sure readings and patient doesn't remember anything. Explore ways to ritualize his birthday. Anniversary of his first death. [name] out [employer] life day. Birthday last year ordered [employer] It was saying good-be again and it was tough. People wrote messaged for the lanterns. His birthday wants to get through going to do the lanterns with sister again. [name] always loved her plants buy a plan that is his plants. Posted something on Facebook last night waited until midnight until it was the  10th.  Shared with therapist what kids wrote. Patient says so hard and so much has changed. Like to be  more active but doesn't feel like doing anything. After memorial came back to [place] and lost dream home. Can't get unstuck try and fake it once in awhile. Health is worrying Worrying that she is going to be dependent. Therapist encouraged her not to focus on things not happened and not helpful. Had issues years ago with voice box nodules thinks what is going on again, has been going on for several months. Clearing her throat once start can't stop. Not sure if nodules get bigger. So worry all the time how much more can she be independent before dependent on kids. Worried about nursing home spine is so bad. Just had surgery for thumb joint replaced and for carpel tunnel the other hand needs the same. Right arm is so painful can barely do anything now over using. Nothing is shaking free, patient says can't move ahead feels stuck. Anti-depressant and sleeping better. Over eating gained weight and not happy about that. Knees arthritis not wanting to carry the extra relief.  Therapist shared some of her interest that can be helpful and patient did say ordered mom's meals. Prepared meals. As far as creativity painted head board as a memorial to him. Wants to finish it today.   Today is anniversary of husband's birthday therapist gave patient space and support to talk about her struggles toward the end with her husband when he was getting really sick how difficult that was, hospitalize 5 times in 4 months, celebrating anniversary of his life last year patient tearful discussing the difficulty felt  again as she was losing him.  Talked about the service patient created for his funeral.  Even though patient struggling encouraged her with activities to ritualize patient shared her Memorial on [employer] she is posted as well as post from kids showing deep love they have for their father.  Therapist pointing out helpfulness  of having people of someone the same way and sharing that bond and can help with grieving.  Therapist providing patient with framework of timeline for grief that at some point she will start to have moments where she realizes life goes on and will be able to experience positive experiences but cannot put a time to it happens when it happens.  Work through patient when has been with sick and went for COVID test therapist again challenges patient on making the right choice to get the test even though has been passed at this point a choice anyone would say was the best choice.  Encourage patient with insight husband would want her to continue to live her life and experience joy and keep that in mind.  Continue to work with patient on her worries challenging her that worries not helpful at the same time validating patient on her health concerns but still therapist guiding patient in perspective of worry is not helpful problem solving, encouraged her to stay in problem solving mode. Suicidal/Homicidal: No  Plan: Return again in 1 week.  2.Patient and therapist start reading Option B by [name].3.  Therapist use grief CBT strategies for patient to help with alleviation of symptoms  Diagnosis: major depressive disorder, recurrent, severe, generalized anxiety disorder  Collaboration of Care: Other none needed  Patient/Guardian was advised Release of Information must be obtained prior to any record release in order to collaborate their care with an outside provider. Patient/Guardian was advised if they have not already done so to contact the registration department to sign all necessary forms in order for [place] to release information regarding their care.   Consent: Patient/Guardian gives verbal consent for treatment and assignment of benefits for services provided during this visit. Patient/Guardian expressed understanding and agreed to proceed.   Coolidge Breeze, LCSW 06/05/2022

## 2022-06-05 NOTE — Progress Notes (Signed)
Pt came in today for B12 injection. Injection given in RD tolerated well.  Pt reports no negative side effects from medication. Denies any dizziness, chest pain or palpitations, and no GI problems.  Pt to RTC in 1 month for next injection

## 2022-06-06 ENCOUNTER — Ambulatory Visit: Payer: Medicare Other

## 2022-06-12 ENCOUNTER — Ambulatory Visit (INDEPENDENT_AMBULATORY_CARE_PROVIDER_SITE_OTHER): Payer: Medicare Other | Admitting: Licensed Clinical Social Worker

## 2022-06-12 ENCOUNTER — Other Ambulatory Visit: Payer: Self-pay | Admitting: Medical-Surgical

## 2022-06-12 DIAGNOSIS — F4321 Adjustment disorder with depressed mood: Secondary | ICD-10-CM | POA: Diagnosis not present

## 2022-06-12 DIAGNOSIS — F411 Generalized anxiety disorder: Secondary | ICD-10-CM | POA: Diagnosis not present

## 2022-06-12 DIAGNOSIS — F332 Major depressive disorder, recurrent severe without psychotic features: Secondary | ICD-10-CM

## 2022-06-12 NOTE — Progress Notes (Signed)
THERAPIST PROGRESS NOTE  Session Time: 10:00 AM to 10:52 AM  Participation Level: Active  Behavioral Response: CasualAlertDepressed  Type of Therapy: Individual Therapy  Treatment Goals addressed: Work on PTSD, depression, anxiety, grief  ProgressTowards Goals: Progressing-patient has significant symptoms but actively working in session on grief, depression, anger  Interventions: Solution Focused, Strength-based, Supportive, and Other: Grief, CBT, coping  Summary: Sheily Lineman is a 71 y.o. female who presents with feels everyone else's grief with family members they have moved on. Hr life has  changed the most. 2 years and still not ok. Don't think her loved ones understand where she is at, lost grandfather but still have a life and continues like what patient would want their life to be. Need for them to recognize that she is not where she would like to be, nor where she was and not the same person. Regrets that can't get over when she was in a depressive episode when she couldn't communicate with him. Times he wouldn't understand her depression. He wouldn't understand it so he would ignore it. Did have different dreams for the future. Shattered her the dreams had were not the same. Didn't have retirement years.  Therapist used the word workaholic and patient agrees work a IT consultant. Tried to have him do a hobby. Important to have memories get involved in children's and grandchildren life. Didn't have the time because of work to do things with her. Did things with kids when young. Patient looking forward to him retiring and he would say not going to retire. Want a life, have fun and do things with him. Not important to him. Did them on her own. He was fine with that. When talk about regrets that she has that she was cheated angry at him but feel mad at herself for being angry at him didn't have what they planned for later in life. They didn't have but he had what his life wanted to be going to  work was his identify. Not having life thought would have depression getting worse and couldn't get through to him. Where was their time barely anything. Don't want to drill into kids anger and regrets she feels. Can't go back and say grandfather didn't do things. Dedicated person and good qualities. Family members didn't have time feel bad because he was dedicated grandfather working. Going on with their life and fine. Can't communicate issues and regrets don't want to tarnish their memories of him. Patient shares a lot anger, regret loss grief disappointment.  Therapist processed feelings related to her grief focused on anger and regrets this session.  Therapist validated patient on not getting the life she had planned because has been was a Office manager.  Therapist understanding these good qualities also have an underside family gets neglected.  Hard to share that with people as they only see the positive qualities of someone who is a good provider.  Assess helpful for patient to have an outlet where she is better understood and her frustrations are acknowledged.  Assess helpful to work through anger feelings as it will help with her mental wellbeing.  Therapist noted being a workaholic is not healthy, thought patient encouraging him with hobbies and spending time with family is actually the healthy way and being a workaholic limits her life.  Therapist utilizing reframing to look at ways she had a positive life there are many aspects she can be grateful for like her to see positive experiences possible in her own life and that she  can be mechanism for that to happen.  Started option be therapist noted Clinical research associate can validate her experiences as well as helping her with CBT talking about how she can move through the process quicker with how she thinks and behave and building resiliency.  Noted patient trying to control her grief and having to let the story unfold its own.  The control does not help.  Noted patient's  role in relationship was different than other family members so her grief is intense and will be different.  Therapist provided space and support for patient to talk about thoughts and feelings in session.  Suicidal/Homicidal: No  Plan: Return again in 1 week.2.  Therapist utilize option be book to help patient work through grief, and all health symptoms  Diagnosis: major depressive disorder, recurrent, severe, generalized anxiety disorder  Collaboration of Care: Other none needed  Patient/Guardian was advised Release of Information must be obtained prior to any record release in order to collaborate their care with an outside provider. Patient/Guardian was advised if they have not already done so to contact the registration department to sign all necessary forms in order for [place] to release information regarding their care.   Consent: Patient/Guardian gives verbal consent for treatment and assignment of benefits for services provided during this visit. Patient/Guardian expressed understanding and agreed to proceed.   Coolidge Breeze, LCSW 06/12/2022

## 2022-06-19 ENCOUNTER — Ambulatory Visit (INDEPENDENT_AMBULATORY_CARE_PROVIDER_SITE_OTHER): Payer: Medicare Other | Admitting: Licensed Clinical Social Worker

## 2022-06-19 ENCOUNTER — Other Ambulatory Visit: Payer: Self-pay | Admitting: Neurology

## 2022-06-19 DIAGNOSIS — E559 Vitamin D deficiency, unspecified: Secondary | ICD-10-CM

## 2022-06-19 DIAGNOSIS — F332 Major depressive disorder, recurrent severe without psychotic features: Secondary | ICD-10-CM | POA: Diagnosis not present

## 2022-06-19 DIAGNOSIS — F4321 Adjustment disorder with depressed mood: Secondary | ICD-10-CM

## 2022-06-19 DIAGNOSIS — F411 Generalized anxiety disorder: Secondary | ICD-10-CM | POA: Diagnosis not present

## 2022-06-19 MED ORDER — VITAMIN D (ERGOCALCIFEROL) 1.25 MG (50000 UNIT) PO CAPS: 50000.0000 [IU] | ORAL_CAPSULE | ORAL | 1 refills | Status: DC

## 2022-06-19 NOTE — Progress Notes (Signed)
THERAPIST PROGRESS NOTE  Session Time: 1:00 PM to 1:54 PM  Participation Level: Active  Behavioral Response: CasualAlertDepressed  Type of Therapy: Individual Therapy  Treatment Goals addressed: Work on PTSD, depression, anxiety, grief  ProgressTowards Goals: Progressing-using bibliotherapy and CBT to help patient with treatment goals  Interventions: CBT, Solution Focused, Strength-based, Supportive, Reframing, and Other: Coping  Summary: Heather Thompson is a 71 y.o. female who presents with highlighted passages and a book for her to talk about an shared how she relates her loss related to a dream unfulfilled. What she thought going to be what he decided not going to be. He said over the years he was going to work until carried out. And he did. Her dreams and she thought their dreams for a future when retire. The way things going patient "guess not going to retire:. Patient asked what about [place] and future and he gets sick and health declines rapidly. Patient describes feeling stuck disappointments of not having a future regrets not having time that they wanted. Angry and not having the time thought was going have. Guilt for being angry. He didn't take care of diabetes so contributed to what happens she thinks.  Miss him terribly miss him every moment of her life. But angry and feels guilty feeling anger. Years dealing with frustrations him not doing what he was supposed to do also her body decaying think disposition for depression.  She thinks he would have done anything for her given her the mood and stars but he didn't take care of himself to have life wanted. Guilt worked with therapist that she couldn't change have to accept that. Exploring "Option B". The three P's. (See below) from the book and therapist said this is something to work on with patient. Another reading said use a CBT strategy and write a belief and write proof that the belief is false. Patient identified with the belief "I'm  never going to feel ok again".  Therapist work with her on challenging it to explore feeling something pleasant as was given an example in book. Patient explored what is ok. Some days putting feet on floor and some days don't even get there and want to stay in bed but knows not good for her.  Shares her husband wanted to make sure she was taking care of kids said they would take care of her patient says they don't take care of her she does take care of herself.  Therapist said may be coming to understand taking care of herself and that will be okay.  Looked at gratitude recommended have to be grateful to blessing have car, groceries roof over head. Has to remember things to be grateful and count blessing. Some anxiety sadness turned to panic. Again gratitude that children raised married and had good marriage. Has a good friend known 30-35 years.  She lost her husband to suicide again ways to be grateful.  See as recovering better than her.  Therapist said comparisons are not useful we are all unique and pointed out ways patient experienced significant loss with a long-term relationship.  Session involved utilizing the book "option B" to help patient in processing and describing her feelings and therapist guiding patient and pulling things out that will be helpful for her.  Helpful to know she is not alone also guidance and how to start the journey of healing.  Noted cognitive distortions therapist pointed out once patient has a need to work on insight that they are distortions, not  accurate.  Assess when she is able to tell and some of them will help with perspective these include distortion of personalization, pervasiveness and permanence noting needing to work with patient on this to challenge in the more we able to give her insight as to why distorted more helpful B.  Also challenging negative thought with proof worked on that in session noting patient pointing out never be okay in ways that can be prove false.   Also looking at things to be grateful for therapist pointing out an important strategy for depression.  Noted patient has anger and therapist validated her on anger and she needs to process through but continue to challenge on feeling guilty about this noting has been was selfish but at the same time reframing to find ways to be grateful that she had a husband who cares about her being okay that she had a long life.  Noted patient coming to understand she may be the person who will make herself okay also possible motivation is being okay as this is what her husband wanted for her besides doing it for herself. Suicidal/Homicidal: No  Plan: Return again in 1 week.2.  Work on coping for grief using bibliotherapy, processing feelings  Diagnosis: major depressive disorder, recurrent, severe, generalized anxiety disorder  Collaboration of Care: Other none needed  Patient/Guardian was advised Release of Information must be obtained prior to any record release in order to collaborate their care with an outside provider. Patient/Guardian was advised if they have not already done so to contact the registration department to sign all necessary forms in order for [place] to release information regarding their care.   Consent: Patient/Guardian gives verbal consent for treatment and assignment of benefits for services provided during this visit. Patient/Guardian expressed understanding and agreed to proceed.   Coolidge Breeze, LCSW 06/19/2022

## 2022-06-21 ENCOUNTER — Encounter: Payer: Self-pay | Admitting: Family

## 2022-06-21 ENCOUNTER — Inpatient Hospital Stay: Payer: Medicare Other | Attending: Hematology & Oncology

## 2022-06-21 ENCOUNTER — Other Ambulatory Visit: Payer: Self-pay | Admitting: Family

## 2022-06-21 ENCOUNTER — Inpatient Hospital Stay: Payer: Medicare Other

## 2022-06-21 ENCOUNTER — Inpatient Hospital Stay (HOSPITAL_BASED_OUTPATIENT_CLINIC_OR_DEPARTMENT_OTHER): Payer: Medicare Other | Admitting: Family

## 2022-06-21 VITALS — BP 106/62 | HR 70 | Temp 98.4°F | Resp 17 | Wt 177.0 lb

## 2022-06-21 DIAGNOSIS — D509 Iron deficiency anemia, unspecified: Secondary | ICD-10-CM

## 2022-06-21 DIAGNOSIS — Z95828 Presence of other vascular implants and grafts: Secondary | ICD-10-CM

## 2022-06-21 DIAGNOSIS — K909 Intestinal malabsorption, unspecified: Secondary | ICD-10-CM | POA: Insufficient documentation

## 2022-06-21 DIAGNOSIS — Z452 Encounter for adjustment and management of vascular access device: Secondary | ICD-10-CM | POA: Diagnosis present

## 2022-06-21 DIAGNOSIS — Z9884 Bariatric surgery status: Secondary | ICD-10-CM | POA: Diagnosis not present

## 2022-06-21 DIAGNOSIS — D508 Other iron deficiency anemias: Secondary | ICD-10-CM | POA: Insufficient documentation

## 2022-06-21 LAB — RETICULOCYTES
Immature Retic Fract: 8.4 % (ref 2.3–15.9)
RBC.: 3.99 MIL/uL (ref 3.87–5.11)
Retic Count, Absolute: 61.4 10*3/uL (ref 19.0–186.0)
Retic Ct Pct: 1.5 % (ref 0.4–3.1)

## 2022-06-21 LAB — CBC WITH DIFFERENTIAL (CANCER CENTER ONLY)
Abs Immature Granulocytes: 0.01 10*3/uL (ref 0.00–0.07)
Basophils Absolute: 0 10*3/uL (ref 0.0–0.1)
Basophils Relative: 1 %
Eosinophils Absolute: 0.2 10*3/uL (ref 0.0–0.5)
Eosinophils Relative: 4 %
HCT: 37.7 % (ref 36.0–46.0)
Hemoglobin: 12.2 g/dL (ref 12.0–15.0)
Immature Granulocytes: 0 %
Lymphocytes Relative: 31 %
Lymphs Abs: 1.4 10*3/uL (ref 0.7–4.0)
MCH: 30.5 pg (ref 26.0–34.0)
MCHC: 32.4 g/dL (ref 30.0–36.0)
MCV: 94.3 fL (ref 80.0–100.0)
Monocytes Absolute: 0.3 10*3/uL (ref 0.1–1.0)
Monocytes Relative: 8 %
Neutro Abs: 2.6 10*3/uL (ref 1.7–7.7)
Neutrophils Relative %: 56 %
Platelet Count: 226 10*3/uL (ref 150–400)
RBC: 4 MIL/uL (ref 3.87–5.11)
RDW: 12.2 % (ref 11.5–15.5)
WBC Count: 4.5 10*3/uL (ref 4.0–10.5)
nRBC: 0 % (ref 0.0–0.2)

## 2022-06-21 LAB — FERRITIN: Ferritin: 124 ng/mL (ref 11–307)

## 2022-06-21 MED ORDER — SODIUM CHLORIDE 0.9% FLUSH
10.0000 mL | INTRAVENOUS | Status: DC | PRN
  Administered 2022-06-21: 10 mL

## 2022-06-21 MED ORDER — HEPARIN SOD (PORK) LOCK FLUSH 100 UNIT/ML IV SOLN
500.0000 [IU] | Freq: Once | INTRAVENOUS | Status: AC | PRN
  Administered 2022-06-21: 500 [IU]

## 2022-06-21 NOTE — Progress Notes (Signed)
Hematology and Oncology Follow Up Visit  Heather Thompson [id number] [birth year] 71 y.o. 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 She notes fatigue and feeling sluggish.  No blood loss noted. No bruising or petechiae.  No fever, chills, n/v, cough, rash, dizziness, SOB, chest pain, palpitations, abdominal pain or changes in bowel or bladder habits at this time.  She has swelling in her hands with arthritis. She is wearing a brace on the right wrist.  No falls or syncope reported.  Appetite is good. She states that she is waking in the middle of the night and wanting to binge eat. She plans to speak with her PCP and see if this could be a side effect of her new sleep medication.  Hydration is good. Weight stable at 177 lbs.   ECOG Performance Status: 1 - Symptomatic but completely ambulatory  Medications:  Allergies as of 06/21/2022       Reactions   Compazine [prochlorperazine] Anaphylaxis   Phenothiazines Anaphylaxis, Swelling   Caused her throat to close   Pregabalin Itching, Swelling   Swelling and itching of hands and feet.    Prednisone Rash           Medication List        Accurate as of June 21, 2022 11:09 AM. If you have any questions, ask your nurse or doctor.          buPROPion 300 MG 24 hr tablet Commonly known as: WELLBUTRIN XL Take 1 tablet (300 mg total) by mouth daily.   Cyanocobalamin 1000 MCG/ML Kit Inject as directed every 30 (thirty) days.   desvenlafaxine 100 MG 24 hr tablet Commonly known as: PRISTIQ Take 1 tablet (100 mg total) by mouth daily.   hydrOXYzine 10 MG tablet Commonly known as: ATARAX Take 1 tablet (10 mg total) by mouth at bedtime as needed.   Magnesium 250 MG Tabs Take 1 tablet by mouth daily.   Multi-Vitamin tablet Take 1 tablet by mouth daily.   nystatin  powder Commonly known as: MYCOSTATIN/NYSTOP Apply 1 application. topically 2 (two) times daily.   oxybutynin 5 MG 24 hr tablet Commonly known as: DITROPAN-XL TAKE 1 TABLET(5 MG) BY MOUTH AT BEDTIME   oxyCODONE-acetaminophen 7.5-325 MG tablet Commonly known as: PERCOCET Take 1 tablet by mouth every 8 (eight) hours as needed.   pantoprazole 40 MG tablet Commonly known as: PROTONIX Take 1 tablet (40 mg total) by mouth daily.   pravastatin 10 MG tablet Commonly known as: PRAVACHOL Take 1 tablet (10 mg total) by mouth daily.   propranolol 20 MG tablet Commonly known as: INDERAL TAKE 1 TABLET(20 MG) BY MOUTH DAILY AS NEEDED   temazepam 30 MG capsule Commonly known as: RESTORIL Take 1 capsule (30 mg total) by mouth daily as needed.   traMADol 50 MG tablet Commonly known as: ULTRAM Take 1 tablet (50 mg total) by mouth every 8 (eight) hours as needed for moderate pain.   valsartan-hydrochlorothiazide 80-12.5 MG tablet Commonly known as: DIOVAN-HCT TAKE 1 TABLET BY MOUTH DAILY   Vitamin D (Ergocalciferol) 1.25 MG (50000 UNIT) Caps capsule Commonly known as: DRISDOL Take 1 capsule (50,000 Units total) by mouth once a week.        Allergies:  Allergies  Allergen Reactions   Compazine [Prochlorperazine] Anaphylaxis   Phenothiazines Anaphylaxis  and Swelling    Caused her throat to close     Pregabalin Itching and Swelling    Swelling and itching of hands and feet.      Prednisone Rash          Past Medical History, Surgical history, Social history, and Family History were reviewed and updated.  Review of Systems: All other 10 point review of systems is negative.   Physical Exam:  weight is 177 lb (80.3 kg). Her oral temperature is 98.4 F (36.9 C). Her blood pressure is 106/62 and her pulse is 70. Her respiration is 17 and oxygen saturation is 99%.   Wt Readings from Last 3 Encounters:  06/21/22 177 lb (80.3 kg)  06/05/22 177 lb (80.3 kg)  04/06/22 174 lb 8  oz (79.2 kg)    Ocular: Sclerae unicteric, pupils equal, round and reactive to light Ear-nose-throat: Oropharynx clear, dentition fair Lymphatic: No cervical or supraclavicular adenopathy Lungs no rales or rhonchi, good excursion bilaterally Heart regular rate and rhythm, no murmur appreciated Abd soft, nontender, positive bowel sounds MSK no focal spinal tenderness, no joint edema Neuro: non-focal, well-oriented, appropriate affect Breasts: Deferred  Lab Results  Component Value Date   WBC 4.5 06/21/2022   HGB 12.2 06/21/2022   HCT 37.7 06/21/2022   MCV 94.3 06/21/2022   PLT 226 06/21/2022   Lab Results  Component Value Date   FERRITIN 171 04/11/2022   IRON 101 04/11/2022   TIBC 384 04/11/2022   UIBC 283 04/11/2022   IRONPCTSAT 26 04/11/2022   Lab Results  Component Value Date   RETICCTPCT 1.5 06/21/2022   RBC 4.00 06/21/2022   RBC 3.99 06/21/2022   No results found for: "KPAFRELGTCHN", "LAMBDASER", "KAPLAMBRATIO" No results found for: "IGGSERUM", "IGA", "IGMSERUM" No results found for: "TOTALPROTELP", "ALBUMINELP", "A1GS", "A2GS", "BETS", "BETA2SER", "GAMS", "MSPIKE", "SPEI"   Chemistry      Component Value Date/Time   NA 136 04/06/2022 0000   K 4.5 04/06/2022 0000   CL 97 (L) 04/06/2022 0000   CO2 31 04/06/2022 0000   BUN 13 04/06/2022 0000   CREATININE 0.65 04/06/2022 0000      Component Value Date/Time   CALCIUM 9.5 04/06/2022 0000   ALKPHOS 85 09/07/2021 1433   AST 22 04/06/2022 0000   AST 30 09/07/2021 1433   ALT 22 04/06/2022 0000   ALT 31 09/07/2021 1433   BILITOT 0.4 04/06/2022 0000   BILITOT 0.3 09/07/2021 1433       Impression and Plan: Heather Thompson is a very pleasant 71 yo caucasian female with history of with history of 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  Iron studies pending.  Port flush every 2 months and follow-up in 6 months.   Eileen Stanford, NP 7/26/202311:09 AM

## 2022-06-21 NOTE — Patient Instructions (Signed)

## 2022-06-22 LAB — IRON AND IRON BINDING CAPACITY (CC-WL,HP ONLY)
Iron: 79 ug/dL (ref 28–170)
Saturation Ratios: 21 % (ref 10.4–31.8)
TIBC: 372 ug/dL (ref 250–450)
UIBC: 293 ug/dL (ref 148–442)

## 2022-06-23 ENCOUNTER — Encounter: Payer: Self-pay | Admitting: Medical-Surgical

## 2022-06-23 ENCOUNTER — Telehealth (INDEPENDENT_AMBULATORY_CARE_PROVIDER_SITE_OTHER): Payer: Medicare Other | Admitting: Medical-Surgical

## 2022-06-23 VITALS — Wt 177.0 lb

## 2022-06-23 DIAGNOSIS — E559 Vitamin D deficiency, unspecified: Secondary | ICD-10-CM

## 2022-06-23 DIAGNOSIS — R7303 Prediabetes: Secondary | ICD-10-CM

## 2022-06-23 DIAGNOSIS — R400 Somnolence: Secondary | ICD-10-CM

## 2022-06-23 DIAGNOSIS — E538 Deficiency of other specified B group vitamins: Secondary | ICD-10-CM

## 2022-06-23 DIAGNOSIS — R635 Abnormal weight gain: Secondary | ICD-10-CM

## 2022-06-23 NOTE — Progress Notes (Signed)
Virtual Visit via Telephone   I connected with  Heather Thompson  on 06/23/22 by telephone/telehealth and verified that I am speaking with the correct person using two identifiers.   I discussed the limitations, risks, security and privacy concerns of performing an evaluation and management service by telephone, including the higher likelihood of inaccurate diagnosis and treatment, and the availability of in person appointments.  We also discussed the likely need of an additional face to face encounter for complete and high quality delivery of care.  I also discussed with the patient that there may be a patient responsible charge related to this service. The patient expressed understanding and wishes to proceed.  Provider location is in medical facility. Patient location is at their home, different from provider location. People involved in care of the patient during this telehealth encounter were myself, my nurse/medical assistant, and my front office/scheduling team member.  CC: Tired/sluggish, weight gain  HPI: Pleasant 71 year old female presenting today to discuss issues with feeling sluggish and tired as well as recent weight gain.  Notes that over the past year she has gained approximately 15 to 20 pounds although she is not doing much different with her activity level or general behaviors.  Notes that she does not eat a lot but she has been waking up approximately 2 hours after going to sleep at night and reports writing the kitchen where she will eat large portions of food, usually junk food.  Admits that she does like desserts especially when she goes out to eat.  And she tends to gravitate towards unhealthy options when eating late at night.  Living with significant chronic pain and is not regularly exercising.  Feels that she has no energy and is sluggish some time.  Long history of insomnia treated with several medications.  She is currently taking temazepam 30 mg nightly along with  hydroxyzine 10 mg nightly.  She is also taking oxybutynin 5 mg nightly.  She wonders if her medications are the cause of her feeling sluggish and having no energy.  She feels over sleepy in the mornings and wonders if it could be related to her medications.  Review of Systems: See HPI for pertinent positives and negatives.   Objective Findings:    General: Speaking full sentences, no audible heavy breathing.  Sounds alert and appropriately interactive.    Impression and Recommendations:    1. Vitamin D deficiency Checking vitamin D. - VITAMIN D 25 Hydroxy (Vit-D Deficiency, Fractures)  2. B12 deficiency Checking vitamin B12. - Vitamin B12  3. Weight gain Checking thyroid.  Unfortunately, I do not believe this is related to a particular medication she is taking.  Instead I feel this is more related to her nighttime eating habits and preference for junk food.  Reviewed all of her medicines and the likelihood of weight gain.  Out of all of her medications, the most likely culprit would be Pristiq however she is doing well on this and does not want to consider switching to a different medication or stopping it.  Recommend stocking her house with healthy options rather than junk food and avoiding eating late at night. - TSH  4. Prediabetes Checking hemoglobin A1c.  She will need to have this drawn after August 11 for insurance coverage.  Her last 1 was 6.0% and there is some concern with her recent weight gain that she may be progressing closer to towards type 2 diabetes. - Hemoglobin A1c; Future  5. Drowsiness With her age, she  is treated with moderate dose of temazepam as well as hydroxyzine and oxybutynin.  This could be simply too much medication for her to adequately process.  Would like to see her come down on temazepam if at all possible but she has been on this dose long-term.  For now recommend stopping hydroxyzine at night to see how her response is to the medication if she is still  able to get quality sleep.  I discussed the above assessment and treatment plan with the patient. The patient was provided an opportunity to ask questions and all were answered. The patient agreed with the plan and demonstrated an understanding of the instructions.   The patient was advised to call back or seek an in-person evaluation if the symptoms worsen or if the condition fails to improve as anticipated.  30 minutes of non-face-to-face time was provided during this encounter.  Return for follow up pending labs results. ___________________________________________ Christen Butter, DNP, APRN, FNP-BC Primary Care and Sports Medicine [facility] [place]

## 2022-06-27 ENCOUNTER — Other Ambulatory Visit: Payer: Self-pay

## 2022-06-27 ENCOUNTER — Ambulatory Visit (INDEPENDENT_AMBULATORY_CARE_PROVIDER_SITE_OTHER): Payer: Medicare Other | Admitting: Licensed Clinical Social Worker

## 2022-06-27 DIAGNOSIS — F4321 Adjustment disorder with depressed mood: Secondary | ICD-10-CM

## 2022-06-27 DIAGNOSIS — F332 Major depressive disorder, recurrent severe without psychotic features: Secondary | ICD-10-CM

## 2022-06-27 DIAGNOSIS — F411 Generalized anxiety disorder: Secondary | ICD-10-CM

## 2022-06-27 MED ORDER — BUPROPION HCL ER (XL) 300 MG PO TB24: 300.0000 mg | ORAL_TABLET | Freq: Every day | ORAL | 1 refills | Status: DC

## 2022-06-27 NOTE — Progress Notes (Signed)
 THERAPIST PROGRESS NOTE  Session Time: 3:50 PM to 4:50 PM  Participation Level: Active  Behavioral Response: CasualAlertDepressed  Type of Therapy: Individual Therapy  Treatment Goals addressed: Work on PTSD, depression, anxiety, grief  ProgressTowards Goals: Progressing-patient both reading and talking about emotions helpful and working through grief and depression  Interventions: Solution Focused, Strength-based, Supportive, and Other: grief  Summary: Heather Thompson is a 71 y.o. female who presents with made a vow working on eating better, eating very good since Friday and started walking. She has been down certain things not going to go away. Had to go to the doctor scale said can't let this happen. Has gastric bypass 26 years ago. Gained about 50 pounds back. [name] started to get sick 2019 lost 40 pounds kids said too thin. 2021 he passes 23 gained 23 pounds back. Getting on the scale and over the mark of 20 pounds put herself strict diet. Since Friday doing good 25 minutes every day. Brisk walking. One change that is happening. More comfortable and where want to be when at lowest weight. [name] says in her head doesn't shut off don't feel like sleep mind going constantly vivid dreamer.  Therapist noted that book about stop overthinking may be helpful for her. Not like she has OCD but sees it to a certain degree. Walk past her refrigerator tiny bit off has to fix the magnet. Things have to be perfect. Her mind best it things a certain way. 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 [place] to where couldn't ask friends over embarrassed having friends to house. Met [name] and his house was perfect.  Attributes being more concerned about cleanliness could be COVID and people more focused on cleanliness. Friend would wipe up after patient and patient did not like when she was doing it and do not want to do  it herself. Thinks she needs to be more aware not become friend.  Reviewed book and think she relates to when the book talked about not feeling crazy been in pain for years some point want you to move on and not be a downer. Point where people want you to be over it. Another sentence she read was let me not die when alive feels so dead inside. Dead inside what is the purpose. Powerful to acknowledge your pain and be here with you. People say that but patient wrote no they don't. Helps when other people have some knowledge of loss. Better connection with sister now after what went through with son, losing him.  Patient has a better understanding from losing her husband.  Elephant in the room chapter means for her patient says grief is in the room and people don't want to acknowledge doesn't leave the room ever meaning always with you. Identified with being a ghost when grief in room and nobody acknowledges. Trying to move on hope that it can happen continues to have a hard time with this.   Continue to utilize book option B to help patient processed through issues related to depression and grief.  Therapist noted a turn in therapy in her perspective as patient focuses on some positive things and not just focusing on depressive feelings including dieting and exercise that she feels she needs to do at this point.  Therapist noted this can help create more momentum and a turnaround as she focuses on positive.  Patient utilizing book to   express how she feels with her supports there is an elephant in the room that is not acknowledged.  Therapist pointed to sections of the chapters of expressed how silence is suffering but having to find the right people who can share things with The book talks about openers and like no one question asking friends openers ask a lot of questions and listen to the answers without judging they enjoyed learning about and feeling connected to others openers can make a big difference in time  of crisis especially for those were normally reticent.  Openers or not always her closest friends people who have faced diversity tend to express more compaction towards others who are suffering.  Writer [name] observes that grief is discussed among those "those of us who recognize in one another a kindred chasm deep in the center of who we." Patient noted her sister having lost her son can meet her at a place of understanding though limited as sister avoids talking about it. In some ways though sister is supportive as the book says the most helpful support usually comes from those who have suffered similar losses. The book goes on to say that even though option A was gone for many of us we were not alone.  There is still powerful evidence that opening up about traumatic events can improve mental and physical health.  Speaking to a friend or family member often helps people understand her own emotions and feel understood.  Patient utilized chapter though did express family not acknowledging deeper level grief that makes it hard.  Think it is helpful to patient have a outlet to express her feelings  Suicidal/Homicidal: No  Plan: Return again in 1 week.2.  Continue to look at option be also stop overthinking and depression handbook  Diagnosis: major depressive disorder, recurrent, severe, generalized anxiety disorder  Collaboration of Care: Other none needed  Patient/Guardian was advised Release of Information must be obtained prior to any record release in order to collaborate their care with an outside provider. Patient/Guardian was advised if they have not already done so to contact the registration department to sign all necessary forms in order for us to release information regarding their care.   Consent: Patient/Guardian gives verbal consent for treatment and assignment of benefits for services provided during this visit. Patient/Guardian expressed understanding and agreed to proceed.   Mary  Bowman, LCSW 06/27/2022  

## 2022-07-03 ENCOUNTER — Other Ambulatory Visit: Payer: Self-pay | Admitting: Medical-Surgical

## 2022-07-04 ENCOUNTER — Ambulatory Visit (INDEPENDENT_AMBULATORY_CARE_PROVIDER_SITE_OTHER): Payer: Medicare Other | Admitting: Licensed Clinical Social Worker

## 2022-07-04 DIAGNOSIS — F411 Generalized anxiety disorder: Secondary | ICD-10-CM

## 2022-07-04 DIAGNOSIS — F332 Major depressive disorder, recurrent severe without psychotic features: Secondary | ICD-10-CM

## 2022-07-04 DIAGNOSIS — F4321 Adjustment disorder with depressed mood: Secondary | ICD-10-CM

## 2022-07-04 NOTE — Progress Notes (Signed)
THERAPIST PROGRESS NOTE  Session Time: 9:00 AM to 9:52 AM  Participation Level: Active  Behavioral Response: CasualAlertDepressed  Type of Therapy: Individual Therapy  Treatment Goals addressed: Continue to look at option be also stop overthinking and depression handbook, look at book on ruminating thoughts  ProgressTowards Goals: Progressing-patient has active symptoms but actively working on them through bibliotherapy and processing feelings in session  Interventions: Solution Focused, Strength-based, Supportive, Reframing, and Other: Grief, coping  Summary: Heather Thompson is a 71 y.o. female who presents with counselor at hospice said that patient is in head don't sleep all night brian doesn't shut off. Therapist asks how she is doing and she says "yes and no". Noin that his week this Friday is second anniversary of [name]'s passing and trying not to live two years ago can't change what happened. If could rewrite time she would but can't. Would change those couple of years yes, can she no. Yes part vowed to herself not to feel so bored and lonely. Dong some reach out. Called hospice support group in [place]. When moved here last year determined to get a job. Beyond tired of being born and lonely. Found out about [place] works find employment for seniors. Found out just an employment agency for anybody. Then heard about senior care. Years ago worked for [employer]. 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 Did call agency. Found out another person who worked there did well financially. Made another phone call a volunteer agency at [facility]. 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 Need to find a purpose. Feels like family doesn't need her. Looked at pros and cons of moving close to family. Patient says is that even home now? Feels like nowhere is home. [place] is home because where born.Even going back where family is feels  doesn't belong there doesn't feel belong anywhere. A bad feeling. Trying to make those steps will anything come of it? Will anything fill void feels empty lost and shattered and completely at a loss of what to do. Can say things to [place]. Can't bring up to [place] with him too many issues talking about it. Elephant in the room when talking to [place]. Still want to be part of the family even though feel like the outsider. They not doing to her and but she doing to herself. Not sure want to go for the holidays to see family sharing experience of feeling like the outsider because things different don't want to feel that way.   Therapist enthusiastic for patient taking steps to address feeling bored and lonely patient taking agency to make changes as progress.  At the same time validated patient on the struggles of connecting with resources not hearing back can be disheartening.  Thus being more active will help patient with mood and help what we are working on which is moving on and creating a new life and new chapter for herself.  Abided patient with another resource with [facility].  Also provided patient with another resource to work on mental health issues as patient identifies one of her core issues needs to work on is over thinking so recommended "stop overthinking that we can work on.  Talk about some of the difficulties of going for family holidays and with family in general feeling like the outsider.  Therapist talked about incorporating old traditions as well as creating new traditions patient having a place in the family and will have  a role though it may have changed.  Noted at the same time people can not be thoughtful and not realize the need to reach out and make want a part of activities specially in patient's position where she is on her own now.  Talked about approaching holidays with enjoying time together and trying to do this as even though she has lost she can value family she still has.  Therapist  looked at option be and provided some quotes that she thought would be helpful for patient some that would help her move toward feeling better.  In the literature talked about people needing to talk about their loss and not avoiding the subject that it can be lonely and wants thoughts and validating for the level and to be talked about.  Noted why therapy helpful and why will probably be helpful for patient to join hospice group.  Therapist provided space and support for patient to talk about thoughts and feelings in session  Suicidal/Homicidal: No  Plan: Return again in 1 week.2.  Continue to look at option be look at ruminating thoughts book, depression handbook processed feelings in session  Diagnosis: major depressive disorder, recurrent, severe, generalized anxiety disorder  Collaboration of Care: Other none needed  Patient/Guardian was advised Release of Information must be obtained prior to any record release in order to collaborate their care with an outside provider. Patient/Guardian was advised if they have not already done so to contact the registration department to sign all necessary forms in order for [place] to release information regarding their care.   Consent: Patient/Guardian gives verbal consent for treatment and assignment of benefits for services provided during this visit. Patient/Guardian expressed understanding and agreed to proceed.   Coolidge Breeze, LCSW 07/04/2022

## 2022-07-06 ENCOUNTER — Other Ambulatory Visit: Payer: Self-pay | Admitting: Medical-Surgical

## 2022-07-06 ENCOUNTER — Ambulatory Visit: Payer: Medicare Other

## 2022-07-10 ENCOUNTER — Other Ambulatory Visit: Payer: Medicare Other

## 2022-07-10 ENCOUNTER — Other Ambulatory Visit: Payer: Self-pay | Admitting: Medical-Surgical

## 2022-07-10 ENCOUNTER — Ambulatory Visit (INDEPENDENT_AMBULATORY_CARE_PROVIDER_SITE_OTHER): Payer: Medicare Other | Admitting: Medical-Surgical

## 2022-07-10 ENCOUNTER — Ambulatory Visit (INDEPENDENT_AMBULATORY_CARE_PROVIDER_SITE_OTHER): Payer: Medicare Other | Admitting: Licensed Clinical Social Worker

## 2022-07-10 VITALS — BP 146/65 | HR 54

## 2022-07-10 DIAGNOSIS — F411 Generalized anxiety disorder: Secondary | ICD-10-CM | POA: Diagnosis not present

## 2022-07-10 DIAGNOSIS — E538 Deficiency of other specified B group vitamins: Secondary | ICD-10-CM

## 2022-07-10 DIAGNOSIS — F332 Major depressive disorder, recurrent severe without psychotic features: Secondary | ICD-10-CM | POA: Diagnosis not present

## 2022-07-10 DIAGNOSIS — F4321 Adjustment disorder with depressed mood: Secondary | ICD-10-CM | POA: Diagnosis not present

## 2022-07-10 DIAGNOSIS — G4700 Insomnia, unspecified: Secondary | ICD-10-CM

## 2022-07-10 MED ORDER — CYANOCOBALAMIN 1000 MCG/ML IJ SOLN
1000.0000 ug | Freq: Once | INTRAMUSCULAR | Status: AC
  Administered 2022-07-10: 1000 ug via INTRAMUSCULAR

## 2022-07-10 NOTE — Progress Notes (Signed)
Pt is here for a vitamin B12 injection.  Pt denies gastrointestinal problems or dizziness.  Injection administered LD.  Pt tolerated well.  Advised to return in 1 month.  Tiajuana Amass, CMA

## 2022-07-10 NOTE — Progress Notes (Addendum)
In person visit-both patient and therapist were in the office  THERAPIST PROGRESS NOTE  Session Time: 9:00 AM to 9:52 AM  Participation Level: Active  Behavioral Response: CasualAlertDepressed  Type of Therapy: Individual Therapy  Treatment Goals addressed: Continue to look at option be also stop overthinking and depression handbook, look at book on ruminating thoughts  ProgressTowards Goals: Progressing-patient expressing severe depression but utilizing therapy processed feelings and identify helpful coping  Interventions: Solution Focused, Strength-based, Supportive, Reframing, and Other: Coping, grief  Summary: Latoyna Herget is a 71 y.o. female who presents with nobody answers her, messages left to be a senior companion nobody calls her back.  Not sure why if people are being rude and don't care. Inconsiderate.  Therapist agrees with that perspective with people who do not answer calls. People don't take time to do what they say they are going to do. Looking for a dog for a new companion. [place] one, no job, no volunteering, no dog. So discouraging. On such a merry go round one minute doors are open and another doors are slammed in her face. So hard to feel making progress when doors slammed in her face. Question whether she is meant to be here. Feels doesn't have a home. Don't know where feel belong anymore. Everyone seems like has their life and doesn't fit in anymore. Last week difficult anniversary of [name] passing. Her head was back to when she was taking care of him. Last Wednesday pity party rebound on Thursday asked friend a widow from a support group to spend time with her since Friday was the date. Spent the rest of the day by herself not sure if being with friend was the healthiest choice. Decided want to talk to [name]'s only living relative. Had a nice chat being widow and their experience that there is couples and there is [place]. Talked to daughter Andrey Campanile and shares her heart breaks her  heart is grieving can't be there when she needs Mom but daughter has other times in her life where doesn't need Mom. Supposed to be there for her. Daughter said to patient have to find where happy. Patient always taken care of and now taking care of herself and not doing a good job. Not liking taking care care of herself. Dad didn't do favors taking care of you all of these years. Trying to realize that she can. Daughter talked about being happy within herself and patient says not happy anywhere. Doesn't know where she can be to be happy doesn't know what happy is. Daughter knows she has depression. Feels like dust in wind asks where to be? What supposed to be?Marland Kitchen Feel in coss roads don't know what road to go.  Therapist says let that be what it is for now.  Therapist continues to work with patient on grief issues providing different perspectives that may help the process including being patient with the process things are not going to suddenly get better but take time for her to feel better.  Patient does not know where she wants to be and therapist responds that may be where she is right now and let that be until it comes to her what would make her happy.  This related patient coming to realize that a significant change in her life and no matter where she goes she will have to create a new life to it, but again eating patients and it accepting it is a process.  Therapist noted what she will bring to this new  life is herself discovering the things that make her happy and will bring that to what ever decision she makes but no matter what she decides she is starting a new chapter with new dynamics.  Patient herself said wants to look into a book it is okay not to be okay and therapist said this says it perfectly accepting makes sense that nothing is okay right now.  Noted patient learning to take care of herself as a challenge but also coming to realize her ability to to take care of herself will be very rewarding.   Therapist provided space and support for patient to talk about thoughts and feelings in session.  Therapist validated patient on process can be tough while reaching out to people's particularly in this area but encouraged patient with persistence what will [place] something break for her.     Suicidal/Homicidal: No  Plan: Return again in 1 week.2.  Continue to look at option be, look at book stop overthinking, processed feelings identify helpful coping  Diagnosis: major depressive disorder, recurrent, severe, generalized anxiety disorder  Collaboration of Care: Other none needed  Patient/Guardian was advised Release of Information must be obtained prior to any record release in order to collaborate their care with an outside provider. Patient/Guardian was advised if they have not already done so to contact the registration department to sign all necessary forms in order for [place] to release information regarding their care.   Consent: Patient/Guardian gives verbal consent for treatment and assignment of benefits for services provided during this visit. Patient/Guardian expressed understanding and agreed to proceed.   Coolidge Breeze, LCSW 07/10/2022

## 2022-07-10 NOTE — Progress Notes (Signed)
Agree with documentation as below.  ___________________________________________ Keyton Bhat L. Job Holtsclaw, DNP, APRN, FNP-BC Primary Care and Sports Medicine [facility]  

## 2022-07-11 LAB — TSH: TSH: 2.11 mIU/L (ref 0.40–4.50)

## 2022-07-11 LAB — VITAMIN D 25 HYDROXY (VIT D DEFICIENCY, FRACTURES): Vit D, 25-Hydroxy: 78 ng/mL (ref 30–100)

## 2022-07-11 LAB — VITAMIN B12: Vitamin B-12: 1361 pg/mL — ABNORMAL HIGH (ref 200–1100)

## 2022-07-12 ENCOUNTER — Other Ambulatory Visit: Payer: Self-pay

## 2022-07-12 ENCOUNTER — Ambulatory Visit (HOSPITAL_COMMUNITY): Payer: Medicare Other | Admitting: Licensed Clinical Social Worker

## 2022-07-12 MED ORDER — TEMAZEPAM 30 MG PO CAPS: ORAL_CAPSULE | ORAL | 0 refills | Status: DC

## 2022-07-12 MED ORDER — PRAVASTATIN SODIUM 10 MG PO TABS: ORAL_TABLET | ORAL | 0 refills | Status: DC

## 2022-07-12 NOTE — Telephone Encounter (Signed)
Last filled 04/10/2022  Last office visit 06/05/2022 with Dr. Linford Arnold  Last office visit 04/06/2022 with you

## 2022-07-12 NOTE — Telephone Encounter (Signed)
Refill sent however with her being on chronic pain medications prescribed by Dr. Oneal Grout, would recommend switching to a nonbenzodiazepine medication for sleep. There is an increased risk of death when using opioids and benzodiazepines concurrently so in order to mitigate that risk, we need to look at other options.

## 2022-07-18 ENCOUNTER — Ambulatory Visit (INDEPENDENT_AMBULATORY_CARE_PROVIDER_SITE_OTHER): Payer: Medicare Other | Admitting: Licensed Clinical Social Worker

## 2022-07-18 DIAGNOSIS — F4321 Adjustment disorder with depressed mood: Secondary | ICD-10-CM | POA: Diagnosis not present

## 2022-07-18 DIAGNOSIS — F332 Major depressive disorder, recurrent severe without psychotic features: Secondary | ICD-10-CM | POA: Diagnosis not present

## 2022-07-18 DIAGNOSIS — F411 Generalized anxiety disorder: Secondary | ICD-10-CM

## 2022-07-18 NOTE — Progress Notes (Signed)
THERAPIST PROGRESS NOTE  Session Time: 11:00 AM to 11:54 AM  Participation Level: Active  Behavioral Response: CasualAlertDepressed  Type of Therapy: Individual Therapy  Treatment Goals addressed: Work on PTSD, depression, anxiety, grief ProgressTowards Goals: Progressing-patient still has significant symptoms but proactively working on strategies to help make progress including joining different groups receptive to therapist suggestions of the new depression handbook on top of book on grief  Interventions: Solution Focused, Strength-based, Supportive, and Other: Coping  Summary: Heather Thompson is a 71 y.o. female who presents with doing ok. She says she wants a reason to get up, take a shower and do make- up too many days where don't have that don't do anything and where depression comes in. Says doing ok. Signed up for hospice spouse group. [facility]. Seeing a young lady, a Firefighter in [place]. Only have one more session. Only can see her once a month. Starting another group with sister a loss grief for any type of loss. Widow's group started it at church last year a social group. These women have known each other for generations. It is ok not what thought it was going to be women who have to learn to take care of themselves.  Talk about things like where do you go to get car fixed? These women have houses here. They have gone through grieving process.  Known each other for years. Out of that adapted a new thought process. She is no longer going to try to talk to people and deeper issues. Think people are done with people hearing her grief, if she breaks down crying, if they want to have friendship they are done hearing the "Debbie downer". For patient to have these acquaintances have to stop being down person. Beyond that and tired of it. This past Sunday went to church patient said with her interacting she would cal it cold and fake didn't even stick around to chat. Now  decided will be acquaintances, if they don't want to be true friends. Hopes that in these group joining swill find true friends. This Sunday doing a painting group. [employer].  Taking a class that guides people and in meditative mind  and paint what comes up on the canvas.  Patient said term is neurographic Therapeutic art. Trying to branch out from the acquaintances on top of that want a volunteer or paying job not happened yet and very discouraged haven't happened yet.  In reviewing session patient again reinforced disappointed with the people at church.  Therapist added people at church expect them to be good people yet sometimes can be the most hurtful people.  Processed patient's feelings and validated her on how she is feeling related to church group who will only interact on a very superficial level needing something more than that not investing as much time and looking for other avenues to find connection help with her grief.  Therapist provided positive feedback for patient joining to more grief groups that may be able to offer this.  Therapist even encouraged her to do more to find other groups to keep her busier as she herself says wakes up does not feel sense of purpose being more active will help with that.  Provided positive feedback for patient going to pain in class and encouraging her doing more of this.  Patient still disappointed about not hearing back from things she feels she needs as well whether it is work or Financial planner agrees with patient as additional activities  that would be really helpful to put in her schedule and continues to encourage patient with patience with this at the same time when sees patient hoping for something to break which therapist believes will at some point.  Therapist pointed out will take her time to find her people so she can go to these different groups till she finds a connection.  Introduced depression handbook for patient as this also will  aid in helping patient to start to work on her sense of self identity creating the next chapter.  Therapist provided space and support for patient to talk about thoughts and feelings in session. Suicidal/Homicidal: No  Plan: Return again in 1 week.2.  Look at option B chapter 3, depression handbook and other topics patient needs to process in session  Diagnosis: major depressive disorder, recurrent, severe, generalized anxiety disorder  Collaboration of Care: Other none needed  Patient/Guardian was advised Release of Information must be obtained prior to any record release in order to collaborate their care with an outside provider. Patient/Guardian was advised if they have not already done so to contact the registration department to sign all necessary forms in order for [place] to release information regarding their care.   Consent: Patient/Guardian gives verbal consent for treatment and assignment of benefits for services provided during this visit. Patient/Guardian expressed understanding and agreed to proceed.   Coolidge Breeze, [place] 07/18/2022

## 2022-07-25 ENCOUNTER — Ambulatory Visit (INDEPENDENT_AMBULATORY_CARE_PROVIDER_SITE_OTHER): Payer: Medicare Other | Admitting: Licensed Clinical Social Worker

## 2022-07-25 DIAGNOSIS — F4321 Adjustment disorder with depressed mood: Secondary | ICD-10-CM | POA: Diagnosis not present

## 2022-07-25 DIAGNOSIS — F411 Generalized anxiety disorder: Secondary | ICD-10-CM | POA: Diagnosis not present

## 2022-07-25 DIAGNOSIS — F332 Major depressive disorder, recurrent severe without psychotic features: Secondary | ICD-10-CM | POA: Diagnosis not present
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
## 2022-08-01 ENCOUNTER — Other Ambulatory Visit: Payer: Self-pay | Admitting: Medical-Surgical

## 2022-08-01 DIAGNOSIS — F322 Major depressive disorder, single episode, severe without psychotic features: Secondary | ICD-10-CM

## 2022-08-03 ENCOUNTER — Other Ambulatory Visit: Payer: Self-pay

## 2022-08-07 ENCOUNTER — Other Ambulatory Visit (HOSPITAL_COMMUNITY): Payer: Self-pay

## 2022-08-07 ENCOUNTER — Other Ambulatory Visit: Payer: Self-pay | Admitting: Medical-Surgical

## 2022-08-07 ENCOUNTER — Ambulatory Visit (INDEPENDENT_AMBULATORY_CARE_PROVIDER_SITE_OTHER): Payer: Medicare Other | Admitting: Medical-Surgical

## 2022-08-07 VITALS — BP 141/47 | HR 63

## 2022-08-07 DIAGNOSIS — E538 Deficiency of other specified B group vitamins: Secondary | ICD-10-CM

## 2022-08-07 DIAGNOSIS — Z23 Encounter for immunization: Secondary | ICD-10-CM

## 2022-08-07 MED ORDER — CYANOCOBALAMIN 1000 MCG/ML IJ SOLN
1000.0000 ug | Freq: Once | INTRAMUSCULAR | Status: AC
  Administered 2022-08-07: 1000 ug via INTRAMUSCULAR

## 2022-08-07 MED ORDER — HYDROXYZINE HCL 10 MG PO TABS: 10.0000 mg | ORAL_TABLET | Freq: Every evening | ORAL | 0 refills | Status: DC | PRN

## 2022-08-07 NOTE — Progress Notes (Signed)
Agree with documentation as below.  ___________________________________________ Verlee Pope L. Iman Reinertsen, DNP, APRN, FNP-BC Primary Care and Sports Medicine [facility]  

## 2022-08-07 NOTE — Progress Notes (Signed)
Established Patient Office Visit  Subjective   Patient ID: Heather Thompson, female    DOB: 1951-05-14  Age: 71 y.o. MRN: [id number]  Chief Complaint  Patient presents with   Pernicious Anemia    HPI  Patient here for B12 injection. Denies irregular heartbeat or GI upset.  ROS    Objective:     BP (!) 156/61   Pulse 72   SpO2 100%    Physical Exam   No results found for any visits on 08/07/22.    The 10-year ASCVD risk score (Arnett DK, et al., 2019) is: 22%    Assessment & Plan:  B12 injection- pt tolerated IM injection without any complications. Return in 4 weeks for B12 injection .  Problem List Items Addressed This Visit   None Visit Diagnoses     B12 deficiency    -  Primary   Relevant Medications   cyanocobalamin (VITAMIN B12) injection 1,000 mcg (Completed) (Start on 08/07/2022  3:30 PM)       Return in about 4 weeks (around 09/04/2022) for B12 injection.    Elizabeth Palau, LPN

## 2022-08-10 ENCOUNTER — Encounter (HOSPITAL_COMMUNITY): Payer: Self-pay | Admitting: Psychiatry

## 2022-08-10 ENCOUNTER — Other Ambulatory Visit: Payer: Self-pay | Admitting: Medical-Surgical

## 2022-08-10 ENCOUNTER — Other Ambulatory Visit: Payer: Self-pay

## 2022-08-10 ENCOUNTER — Ambulatory Visit (INDEPENDENT_AMBULATORY_CARE_PROVIDER_SITE_OTHER): Payer: Medicare Other | Admitting: Psychiatry

## 2022-08-10 VITALS — BP 150/82 | Temp 97.4°F | Ht 64.0 in | Wt 178.0 lb

## 2022-08-10 DIAGNOSIS — F4321 Adjustment disorder with depressed mood: Secondary | ICD-10-CM

## 2022-08-10 DIAGNOSIS — F5102 Adjustment insomnia: Secondary | ICD-10-CM | POA: Diagnosis not present

## 2022-08-10 DIAGNOSIS — G4700 Insomnia, unspecified: Secondary | ICD-10-CM

## 2022-08-10 DIAGNOSIS — F411 Generalized anxiety disorder: Secondary | ICD-10-CM | POA: Diagnosis not present

## 2022-08-10 DIAGNOSIS — F332 Major depressive disorder, recurrent severe without psychotic features: Secondary | ICD-10-CM

## 2022-08-10 MED ORDER — TEMAZEPAM 30 MG PO CAPS: ORAL_CAPSULE | ORAL | 0 refills | Status: DC

## 2022-08-10 MED ORDER — FLUOXETINE HCL 10 MG PO CAPS: 10.0000 mg | ORAL_CAPSULE | Freq: Every day | ORAL | 0 refills | Status: DC
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
## 2022-08-22 ENCOUNTER — Inpatient Hospital Stay: Payer: Medicare Other | Attending: Hematology & Oncology

## 2022-08-22 VITALS — BP 154/68 | HR 70 | Temp 97.8°F | Resp 17

## 2022-08-22 DIAGNOSIS — Z452 Encounter for adjustment and management of vascular access device: Secondary | ICD-10-CM | POA: Insufficient documentation

## 2022-08-22 DIAGNOSIS — K909 Intestinal malabsorption, unspecified: Secondary | ICD-10-CM | POA: Diagnosis present

## 2022-08-22 DIAGNOSIS — D508 Other iron deficiency anemias: Secondary | ICD-10-CM | POA: Insufficient documentation

## 2022-08-22 DIAGNOSIS — Z95828 Presence of other vascular implants and grafts: Secondary | ICD-10-CM

## 2022-08-22 MED ORDER — SODIUM CHLORIDE 0.9% FLUSH
10.0000 mL | Freq: Once | INTRAVENOUS | Status: AC
  Administered 2022-08-22: 10 mL via INTRAVENOUS

## 2022-08-22 MED ORDER — HEPARIN SOD (PORK) LOCK FLUSH 100 UNIT/ML IV SOLN
500.0000 [IU] | Freq: Once | INTRAVENOUS | Status: AC
  Administered 2022-08-22: 500 [IU] via INTRAVENOUS

## 2022-08-22 NOTE — Patient Instructions (Signed)

## 2022-09-04 ENCOUNTER — Ambulatory Visit (INDEPENDENT_AMBULATORY_CARE_PROVIDER_SITE_OTHER): Payer: Medicare Other | Admitting: Medical-Surgical

## 2022-09-04 VITALS — BP 142/58 | HR 74 | Temp 99.5°F | Ht 64.0 in | Wt 182.5 lb

## 2022-09-04 DIAGNOSIS — E538 Deficiency of other specified B group vitamins: Secondary | ICD-10-CM | POA: Diagnosis not present

## 2022-09-04 MED ORDER — CYANOCOBALAMIN 1000 MCG/ML IJ SOLN
1000.0000 ug | Freq: Once | INTRAMUSCULAR | Status: AC
  Administered 2022-09-04: 1000 ug via INTRAMUSCULAR

## 2022-09-04 NOTE — Progress Notes (Signed)
Patient is here for a vitamin B 12 injection. Denies gastrointestinal problems or dizziness.   Patient tolerated injection well without complications on the. Patient advised to schedule next injection in 30 days.

## 2022-09-04 NOTE — Progress Notes (Signed)
Agree with documentation as below.  ___________________________________________ Nickalus Thornsberry L. Gracieann Stannard, DNP, APRN, FNP-BC Primary Care and Sports Medicine [facility] [place]  

## 2022-09-05 ENCOUNTER — Ambulatory Visit (INDEPENDENT_AMBULATORY_CARE_PROVIDER_SITE_OTHER): Payer: Medicare Other | Admitting: Licensed Clinical Social Worker

## 2022-09-05 DIAGNOSIS — F411 Generalized anxiety disorder: Secondary | ICD-10-CM | POA: Diagnosis not present

## 2022-09-05 DIAGNOSIS — M4692 Unspecified inflammatory spondylopathy, cervical region: Secondary | ICD-10-CM | POA: Insufficient documentation

## 2022-09-05 DIAGNOSIS — F332 Major depressive disorder, recurrent severe without psychotic features: Secondary | ICD-10-CM | POA: Diagnosis not present

## 2022-09-05 DIAGNOSIS — F4321 Adjustment disorder with depressed mood: Secondary | ICD-10-CM | POA: Diagnosis not present

## 2022-09-05 HISTORY — DX: Unspecified inflammatory spondylopathy, cervical region: M46.92

## 2022-09-05 NOTE — Progress Notes (Signed)
THERAPIST PROGRESS NOTE  Session Time: 11:03 AM to 11:56 AM  Participation Level: Active  Behavioral Response: CasualAlertDepressed-reports situations that caused depression recently appropriate in session  Type of Therapy: Individual Therapy  Treatment Goals addressed: Work on PTSD, depression, anxiety, grief  ProgressTowards Goals: Progressing-patient using session for processing feelings to help with coping helping her make healthy choices for herself  Interventions: Solution Focused, Strength-based, Supportive, and Other: coping  Summary: Heather Thompson is a 71 y.o. female who presents with went to [facility] hospice for a spousal grieving group. With [place] sister got a job didn't end up going. Has had some really down days. Feeling now even the hospice place that was supposed to be so good and not turning out well and what it was meant to be. Patient asks does that say nobody cares? [employer] as a great caring organization caring families but they don't "bummed me out". Very hurt about learning about granddaughter's engagement. Upset with son.  He went to [employer] at the same time that granddaughter and her boyfriend went did not tell there were plans for engagement but told her brother.  Therapist validated patient explored reason one thing is that he can be cold and selfish. Can't think of last time gave a hug, can be not caring about her feelings. He did call a couple days later saying I am sorry but that was it. Patient made a pretty big decision, if can happen next year made a decision to move back where her family is.  Not sure if feasible but tentative plan have to see financially if she is able to do it. Has hand surgery on Thursday. Already had brace on right. Hope to go back home Thanksgiving and Christmas depending on how hand goes.  If she does go home [place]. Shared her concern go back to be with family be places where she [name] was  always there. Not sure how much is going to bother her. They never had a chance to have family things without him. Now living in this area without him. There is a good aspect not having reminders all the time. Patient shared about helpfulness of seeing psychiatrist encouraging her to make a choice about what she wants to do. Concern with medical health deteriorating and what that would mean patient did make the comment probably be better to be near family if that would happen.   Therapist processed recent events with patient providing space and support for patient to talk about her feelings processing thoughts and feelings help with insight as well as coping.  Validated patient on disappointments with grief group.  The experience with this group has led patient to making further decisions for herself, if possible moving back to where her family is in [place].  Noted some of the concerns but also some of the benefits for her rash significant that she is felt she has missed out family celebrations and feeling disconnected being further away from them.  Therapist provided her feedback that is noted patient disappointment with being far away from family so sees this as a positive choice potentially.  Also positive she is going to go to the holidays therapist assesses that this is a good first step for her although still not absolutely sure so we will have to see how things develop, different things like financial concerns may be a hindrance.  Process patient's feelings related to feeling hurt about son therapist validating patient  about this also exploring some of the underlying reasons patient saying that he can be insensitive and cold fits with what he recently did but still does not make what he did in the last heart full for patient therapist provided active listening open questions supportive interventions. Suicidal/Homicidal: No  Plan: Return again in 1 week.2.  Complete treatment plan. 3.  Look at  books for grief and depression  Diagnosis: major depressive disorder, recurrent, severe, generalized anxiety disorder  Collaboration of Care: Other none needed  Patient/Guardian was advised Release of Information must be obtained prior to any record release in order to collaborate their care with an outside provider. Patient/Guardian was advised if they have not already done so to contact the registration department to sign all necessary forms in order for [place] to release information regarding their care.   Consent: Patient/Guardian gives verbal consent for treatment and assignment of benefits for services provided during this visit. Patient/Guardian expressed understanding and agreed to proceed.   Cordella Register, LCSW 09/05/2022

## 2022-09-06 ENCOUNTER — Other Ambulatory Visit (HOSPITAL_COMMUNITY): Payer: Self-pay | Admitting: Psychiatry

## 2022-09-06 ENCOUNTER — Other Ambulatory Visit: Payer: Self-pay | Admitting: Medical-Surgical

## 2022-09-06 DIAGNOSIS — G4700 Insomnia, unspecified: Secondary | ICD-10-CM

## 2022-09-07 NOTE — Telephone Encounter (Signed)
Pt was going into surgery and will call us back at a later date to schedule this appointment.-tvt

## 2022-09-07 NOTE — Telephone Encounter (Signed)
Due for follow up. Please contact patient to schedule an in office appointment.

## 2022-09-08 ENCOUNTER — Other Ambulatory Visit: Payer: Self-pay | Admitting: Medical-Surgical

## 2022-09-12 ENCOUNTER — Ambulatory Visit (INDEPENDENT_AMBULATORY_CARE_PROVIDER_SITE_OTHER): Payer: Medicare Other | Admitting: Licensed Clinical Social Worker

## 2022-09-12 DIAGNOSIS — F411 Generalized anxiety disorder: Secondary | ICD-10-CM | POA: Diagnosis not present

## 2022-09-12 DIAGNOSIS — F4321 Adjustment disorder with depressed mood: Secondary | ICD-10-CM

## 2022-09-12 DIAGNOSIS — F332 Major depressive disorder, recurrent severe without psychotic features: Secondary | ICD-10-CM
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
## 2022-09-12 NOTE — Progress Notes (Signed)
THERAPIST PROGRESS NOTE  Session Time: 9:00 AM to 9:54 AM  Participation Level: Active  Behavioral Response: CasualAlertDepressed  Type of Therapy: Individual Therapy  Treatment Goals addressed: Work on PTSD, depression, anxiety, grief  ProgressTowards Goals: Progressing-reviewed treatment plan patient does endorse feeling stuck but therapist assesses actively working on treatment goals in session guiding patient in beginning to live her life for herself as she works through grief transitioning in her life  Interventions: Solution Focused, Strength-based, Supportive, Reframing, and Other: coping  Summary: Zurielle Kohrs is a 71 y.o. female who presents with surgery on left arm. "It's ok". Without having a partner doing things on own very challenging.  Therapist reinforced this as challenging. Thinks the Prozac helping a little not as many meltdowns as did.  Shared one meltdown she did have. Nobody asked if she needed help with left arm that hurt. That is what patient would do. They know how she bends over backwards for them(family) and they didn't even think to acknowledge to say I hope you have help. Patient explored and wondered how daughter is that [place] sometimes treats her like her clients hardened by what put her through seen her at worst bothers her also with her own clients that she doesn't have the compassion that she should or would. Perhaps toughened through work and have seen patient have mental issues having been hospitalized. One grandson son's son called her a couple days ago he calls once in awhile to say what is going on his life. Are they still too young to think and consider how is grandmother is doing. Saying move back there be there to be near them but do they care patient asks? Do they deep down care that there or just Mom is here there so let's invite her over but not like really want to spend time and really want to be with her. Will it be obligation? As long as physical and  mental healthy want her own place marriage is another thing. Therapist pointed out can be advantageous to travel on own. Friend said live her life for herself. Patient has never done that. Wants to be in family's life but two way [address] do they want to be in her life? Is it going to matter that patient is closer to them in life. Patient is exploring how they feel to make her choices.  Therapist said her treatment intervention is to guide patient in living her life for herself what ever that may be that will make her happy.  Therapist provided space and support for patient to talk about thoughts and feelings in session processing her feelings related to feeling her heart family was not thinking about her that she may need help after her surgery.  Therapist labeling this is selfishness on their part.  Explored with patient whether this would continue if she moved closer to them that patient wants them in her life but would they reciprocate that and feel the same way.  Part of her decision making and moving closer.  She will utilize Thanksgiving and Christmas as chance to figure that out.  Therapist reframes to encourage to live life for her what ever that might entail helpful when patient shared that message that her friend gave her to reinforce this concept that therapist assesses will improve patient's wellbeing.  Reviewed treatment plan patient gave her verbal consent to complete in office.  Suicidal/Homicidal: No  Plan: Return again in 1 week.2.Look at books for grief and depression .  Diagnosis:  major depressive disorder, recurrent, severe, generalized anxiety disorder, grief   Collaboration of Care: Other none needed  Patient/Guardian was advised Release of Information must be obtained prior to any record release in order to collaborate their care with an outside provider. Patient/Guardian was advised if they have not already done so to contact the registration department to sign all necessary forms  in order for [place] to release information regarding their care.   Consent: Patient/Guardian gives verbal consent for treatment and assignment of benefits for services provided during this visit. Patient/Guardian expressed understanding and agreed to proceed.   Cordella Register, LCSW 09/12/2022

## 2022-09-19 ENCOUNTER — Ambulatory Visit (INDEPENDENT_AMBULATORY_CARE_PROVIDER_SITE_OTHER): Payer: Medicare Other | Admitting: Licensed Clinical Social Worker

## 2022-09-19 DIAGNOSIS — F411 Generalized anxiety disorder: Secondary | ICD-10-CM

## 2022-09-19 DIAGNOSIS — F332 Major depressive disorder, recurrent severe without psychotic features: Secondary | ICD-10-CM

## 2022-09-19 DIAGNOSIS — F4321 Adjustment disorder with depressed mood: Secondary | ICD-10-CM

## 2022-09-19 NOTE — Progress Notes (Signed)
THERAPIST PROGRESS NOTE  Session Time: 11:00 AM to 11:54 AM  Participation Level: Active  Behavioral Response: CasualAlertDepressed  Type of Therapy: Individual Therapy  Treatment Goals addressed: Work on PTSD, depression, anxiety, grief  ProgressTowards Goals: Progressing-patient using therapy for processing of feelings and thoughts therapist intervention is to utilize reframing and challenge thoughts that could be distorted  Interventions: CBT, Solution Focused, Strength-based, Supportive, Reframing, and Other: coping  Summary: Heather Thompson is a 71 y.o. female who presents with frustrations with arm.  Therapist validating patient knowing injuring a body part cannot be a struggle.  Patient sharing having trouble putting on a blouse that fits around the cast, not being able to wear a bra lots of different struggles. She hasn't  gone anywhere but did go [place]. She identifies cabin fever.  Notes some progress not having the big meltdowns minor meltdowns.   Discouraged with her family. Wonders if they care if she moves back there. Grandkids are excited she is coming back at least 3 out 4 who she has talked to. Adult kids not showing the same enthusiasm. Talked with her son getting a duplex likes to put decorations in the yard. That are some of the things she misses. Told son keep in mind coming and looking. He said she would have to find somebody take care of the yard never said would come help. Patient asks what is up with them? They have gotten so selfish all care about is their lives don't care about mom anymore. [name] continues to do her thing when patient there patient said what is the point, why there? No one asking her about her arm very discouraging. Sandy calls every evening from work. No caring or asking how getting along.  Just basic daily life things. Has new health concern not sure if can go see family now or not.Something wrong with esophagus. Have had a  couple tests that show not working, affecting her voice. She had a regular barium swallow read wrong read normal. ENT sent her for another test modified where found esophagus and voicebox inflamed patient explains if talk too much clear her throat has had that problem. Seems like getting worse past couple of years. PCP took her off of lisinopril can cause chronic coughing did that and didn't make a difference got meds for GERD and that did not help, speech pathologist able to see that esphagus not pulling food down, bacteria building up. Said need a Geophysical data processor. Got caught in the middle when GI said she did not need a stomach doctor.  All of this affecting her voice. Told her she is high risk for bacterial pneumonia. See her this afternoon don't know what is going on with esophagus. Not eating or drinking only way voicebox better don't have to clear her throat.  But also has to swallow when needs to clear her throat Another issue kids have not said is it going to get straightened out can you came can she still come. Feels completely alone. Feels like nobody cares anymore.  Therapist utilize reframing to note brothers and sisters care friend cares grandkids care and she is still not sure we will need to check her thoughts when she visits her family for assessment of her concerns.  Daughter did say she tends to catastrophize therapist noting not help for sympathetic when dealing with this health issue.  Sister planning trips with her husband led to patient saying not feeling like anything is there her.  Therapist challenged her reframed noted there are people that Care Plus it is a struggle but she will start to build a life so she will be there for herself.  Patient went on to talk about brothers and sisters she is close with them can stay with them as well as friend prefers to be in [place] rather than here does not feel being here has worked out.       Therapist reviewed symptoms, facilitated expression  of thoughts and feeling as helpful for patient to have an outlet to talk about some of her discouragements processing helping with coping.  Therapist utilizing reframing more possible when therapist identified patient framing things to negatively.  Same time validating patient how she felt kids not shower the caring that she would expect from them.  Therapist pointed out patient being able to stay with friend brothers and sisters noting some of the positive people who care about her therapist likes the plan of patient going to spend time in [place] with family and friends.  Therapist worked on giving patient perspective of the journey she is on is a difficult one eventually she will find a life people she can connect with.  Processed patient's feelings related to recent medical issues again therapist says helpful for patient to have an outlet to talk about her feelings and thoughts.  Reviewed whether she would want to stay here as home base or [place] so patient able to frame a little more positively that she would rather be in [place] as she would at least be able to spend time with grandchildren.  Therapist also encouraged patient to go on holiday and see whether her concerns are accurate or whether they are not accurate and she is thinking of things and to negative the light.  Therapist provided active listening open questions supportive interventions. Suicidal/Homicidal: No  Plan: 1.patient has not scheduled any more appointments but advised to come back 1 to 2 weeks work on cognitive distortions, grief issues, coping  Diagnosis: major depressive disorder, recurrent, severe, generalized anxiety disorder, grief  Collaboration of Care: Other none needed  Patient/Guardian was advised Release of Information must be obtained prior to any record release in order to collaborate their care with an outside provider. Patient/Guardian was advised if they have not already done so to contact the  registration department to sign all necessary forms in order for [place] to release information regarding their care.   Consent: Patient/Guardian gives verbal consent for treatment and assignment of benefits for services provided during this visit. Patient/Guardian expressed understanding and agreed to proceed.   Coolidge Breeze, LCSW 09/19/2022

## 2022-09-20 ENCOUNTER — Other Ambulatory Visit: Payer: Self-pay | Admitting: Medical-Surgical

## 2022-09-20 DIAGNOSIS — E559 Vitamin D deficiency, unspecified: Secondary | ICD-10-CM

## 2022-09-27 ENCOUNTER — Other Ambulatory Visit: Payer: Self-pay | Admitting: Medical-Surgical

## 2022-09-27 DIAGNOSIS — E559 Vitamin D deficiency, unspecified: Secondary | ICD-10-CM

## 2022-10-02 ENCOUNTER — Ambulatory Visit (INDEPENDENT_AMBULATORY_CARE_PROVIDER_SITE_OTHER): Payer: Medicare Other | Admitting: Medical-Surgical

## 2022-10-02 VITALS — BP 145/51 | HR 65 | Ht 64.0 in | Wt 182.5 lb

## 2022-10-02 DIAGNOSIS — E559 Vitamin D deficiency, unspecified: Secondary | ICD-10-CM

## 2022-10-02 DIAGNOSIS — E538 Deficiency of other specified B group vitamins: Secondary | ICD-10-CM

## 2022-10-02 MED ORDER — VITAMIN D (ERGOCALCIFEROL) 1.25 MG (50000 UNIT) PO CAPS: 50000.0000 [IU] | ORAL_CAPSULE | ORAL | 1 refills | Status: DC

## 2022-10-02 MED ORDER — CYANOCOBALAMIN 1000 MCG/ML IJ SOLN
1000.0000 ug | Freq: Once | INTRAMUSCULAR | Status: AC
  Administered 2022-10-02: 1000 ug via INTRAMUSCULAR

## 2022-10-02 NOTE — Progress Notes (Signed)
Patient is here for a vitamin B12 injection. Denies GI problems or dizziness.   Patient tolerated injection well without complications. Pt is advised to schedule next injection in 30 days.  Location:  RD 

## 2022-10-02 NOTE — Progress Notes (Signed)
Agree with documentation as below.  ___________________________________________ Heather Tiano [name], Heather Thompson, Heather Thompson, Heather Thompson Primary Care and Sports Medicine [facility]  

## 2022-10-03 ENCOUNTER — Encounter: Payer: Self-pay | Admitting: Medical-Surgical

## 2022-10-05 ENCOUNTER — Other Ambulatory Visit (HOSPITAL_COMMUNITY): Payer: Self-pay | Admitting: Psychiatry

## 2022-10-05 ENCOUNTER — Ambulatory Visit (INDEPENDENT_AMBULATORY_CARE_PROVIDER_SITE_OTHER): Payer: Medicare Other | Admitting: Sports Medicine

## 2022-10-05 ENCOUNTER — Ambulatory Visit (INDEPENDENT_AMBULATORY_CARE_PROVIDER_SITE_OTHER): Payer: Medicare Other

## 2022-10-05 DIAGNOSIS — M180 Bilateral primary osteoarthritis of first carpometacarpal joints: Secondary | ICD-10-CM | POA: Diagnosis not present

## 2022-10-05 DIAGNOSIS — M170 Bilateral primary osteoarthritis of knee: Secondary | ICD-10-CM

## 2022-10-05 MED ORDER — TRIAMCINOLONE ACETONIDE 40 MG/ML IJ SUSP
80.0000 mg | Freq: Once | INTRAMUSCULAR | Status: AC
  Administered 2022-10-05: 80 mg via INTRAMUSCULAR

## 2022-10-05 MED ORDER — TRAMADOL HCL 50 MG PO TABS: 50.0000 mg | ORAL_TABLET | Freq: Three times a day (TID) | ORAL | 0 refills | Status: AC | PRN

## 2022-10-05 NOTE — Assessment & Plan Note (Signed)
Heather Thompson returns, she is a pleasant 71 year old female, she has bilateral knee osteoarthritis, last injection right knee in April, lasted about 6 months. Repeat bilateral injections today. We can also get her approved for viscosupplementation at a follow-up visit if insufficient improvement.

## 2022-10-05 NOTE — Assessment & Plan Note (Signed)
Status post [facility] arthroplasty. Still with significant pain at the interphalangeal joints of the fingers, thumb is doing okay. Oral NSAIDs have not been effective, adding tramadol which I am happy to do long-term if it works. She understands if we need to escalate to hydrocodone or oxycodone or any other scheduled to controlled substance we would need pain management involved.

## 2022-10-05 NOTE — Progress Notes (Signed)
    Procedures performed today:    Procedure: Real-time Ultrasound Guided injection of the left knee Device: Samsung HS60  Verbal informed consent obtained.  Time-out conducted.  Noted no overlying erythema, induration, or other signs of local infection.  Skin prepped in a sterile fashion.  Local anesthesia: Topical Ethyl chloride.  With sterile technique and under real time ultrasound guidance: Trace effusion noted 1 cc Kenalog 40, 2 cc lidocaine, 2 cc bupivacaine injected easily Completed without difficulty  Advised to call if fevers/chills, erythema, induration, drainage, or persistent bleeding.  Images permanently stored and available for review in PACS.  Impression: Technically successful ultrasound guided injection.  Procedure: Real-time Ultrasound Guided injection of the right knee Device: Samsung HS60  Verbal informed consent obtained.  Time-out conducted.  Noted no overlying erythema, induration, or other signs of local infection.  Skin prepped in a sterile fashion.  Local anesthesia: Topical Ethyl chloride.  With sterile technique and under real time ultrasound guidance: Trace effusion noted 1 cc Kenalog 40, 2 cc lidocaine, 2 cc bupivacaine injected easily Completed without difficulty  Advised to call if fevers/chills, erythema, induration, drainage, or persistent bleeding.  Images permanently stored and available for review in PACS.  Impression: Technically successful ultrasound guided injection.  Independent interpretation of notes and tests performed by another provider:   None.  Brief History, Exam, Impression, and Recommendations:    Primary osteoarthritis of both knees Heather Thompson returns, she is a pleasant 71 year old female, she has bilateral knee osteoarthritis, last injection right knee in April, lasted about 6 months. Repeat bilateral injections today. We can also get her approved for viscosupplementation at a follow-up visit if insufficient  improvement.  Primary osteoarthritis of both first carpometacarpal joints Status post [facility] arthroplasty. Still with significant pain at the interphalangeal joints of the fingers, thumb is doing okay. Oral NSAIDs have not been effective, adding tramadol which I am happy to do long-term if it works. She understands if we need to escalate to hydrocodone or oxycodone or any other scheduled to controlled substance we would need pain management involved.    ____________________________________________ Ihor Austin. Benjamin Stain, M.D., ABFM., CAQSM., AME. Primary Care and Sports Medicine [facility]  Adjunct Professor of Family Medicine  [place] [employer] [facility] [employer]  Restaurant manager, fast food

## 2022-10-05 NOTE — Addendum Note (Signed)
Addended by: Carren Rang A on: 10/05/2022 02:36 PM   Modules accepted: Orders

## 2022-10-09 ENCOUNTER — Other Ambulatory Visit (HOSPITAL_COMMUNITY): Payer: Self-pay | Admitting: Psychiatry

## 2022-10-09 ENCOUNTER — Other Ambulatory Visit: Payer: Self-pay | Admitting: Medical-Surgical

## 2022-10-09 DIAGNOSIS — G4700 Insomnia, unspecified: Secondary | ICD-10-CM

## 2022-10-10 ENCOUNTER — Ambulatory Visit (INDEPENDENT_AMBULATORY_CARE_PROVIDER_SITE_OTHER): Payer: Medicare Other | Admitting: Psychiatry

## 2022-10-10 ENCOUNTER — Encounter (HOSPITAL_COMMUNITY): Payer: Self-pay | Admitting: Psychiatry

## 2022-10-10 VITALS — BP 134/72 | Temp 98.6°F | Ht 64.0 in | Wt 180.0 lb

## 2022-10-10 DIAGNOSIS — F411 Generalized anxiety disorder: Secondary | ICD-10-CM

## 2022-10-10 DIAGNOSIS — F322 Major depressive disorder, single episode, severe without psychotic features: Secondary | ICD-10-CM

## 2022-10-10 DIAGNOSIS — F4321 Adjustment disorder with depressed mood: Secondary | ICD-10-CM | POA: Diagnosis not present

## 2022-10-10 DIAGNOSIS — F332 Major depressive disorder, recurrent severe without psychotic features: Secondary | ICD-10-CM | POA: Diagnosis not present

## 2022-10-10 DIAGNOSIS — F5102 Adjustment insomnia: Secondary | ICD-10-CM

## 2022-10-10 MED ORDER — DESVENLAFAXINE SUCCINATE ER 100 MG PO TB24: 100.0000 mg | ORAL_TABLET | Freq: Every day | ORAL | 0 refills | Status: DC

## 2022-10-10 MED ORDER — FLUOXETINE HCL 10 MG PO CAPS: 10.0000 mg | ORAL_CAPSULE | Freq: Every day | ORAL | 0 refills | Status: DC

## 2022-10-10 NOTE — Telephone Encounter (Signed)
Pt stated that she was in a doctor's appointment, and would be out of town during the holiday's, and would call back to schedule her appointment at a later date. Tvt

## 2022-10-10 NOTE — Telephone Encounter (Signed)
Patient due for an office follow-up with PCP for chronic conditions.  Please contact her to facilitate scheduling.
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
## 2022-10-12 ENCOUNTER — Inpatient Hospital Stay: Payer: Medicare Other

## 2022-10-12 ENCOUNTER — Inpatient Hospital Stay: Payer: Medicare Other | Attending: Hematology & Oncology

## 2022-10-12 VITALS — BP 136/70 | HR 75 | Temp 98.1°F | Resp 18

## 2022-10-12 DIAGNOSIS — Z452 Encounter for adjustment and management of vascular access device: Secondary | ICD-10-CM | POA: Diagnosis not present

## 2022-10-12 DIAGNOSIS — D508 Other iron deficiency anemias: Secondary | ICD-10-CM | POA: Diagnosis present

## 2022-10-12 DIAGNOSIS — Z95828 Presence of other vascular implants and grafts: Secondary | ICD-10-CM

## 2022-10-12 DIAGNOSIS — K909 Intestinal malabsorption, unspecified: Secondary | ICD-10-CM | POA: Insufficient documentation

## 2022-10-12 MED ORDER — HEPARIN SOD (PORK) LOCK FLUSH 100 UNIT/ML IV SOLN
500.0000 [IU] | Freq: Once | INTRAVENOUS | Status: AC | PRN
  Administered 2022-10-12: 500 [IU]

## 2022-10-12 MED ORDER — SODIUM CHLORIDE 0.9% FLUSH
10.0000 mL | INTRAVENOUS | Status: DC | PRN
  Administered 2022-10-12: 10 mL

## 2022-10-12 NOTE — Patient Instructions (Signed)

## 2022-11-08 ENCOUNTER — Other Ambulatory Visit: Payer: Self-pay

## 2022-11-08 DIAGNOSIS — G4700 Insomnia, unspecified: Secondary | ICD-10-CM

## 2022-11-08 NOTE — Telephone Encounter (Signed)
I cannot send controlled substances across state lines.

## 2022-11-08 NOTE — Telephone Encounter (Signed)
Patient is in [place] for the holidays with family and she needs a refill on her Temazepam refilled.   [facility] of [address] in [place].

## 2022-11-09 NOTE — Telephone Encounter (Signed)
She has 2 left and is staying with her daughter till January 6th.  She wants to know if you can send something that is similar to Temazepam that is not a controlled substance.  If not what is her other options?

## 2022-11-10 ENCOUNTER — Telehealth: Payer: Self-pay

## 2022-11-10 MED ORDER — TRAZODONE HCL 50 MG PO TABS: 50.0000 mg | ORAL_TABLET | Freq: Every day | ORAL | 0 refills | Status: DC

## 2022-11-10 NOTE — Telephone Encounter (Signed)
Updated pharmacy

## 2022-11-10 NOTE — Addendum Note (Signed)
Addended byChristen Butter on: 11/10/2022 05:32 PM   Modules accepted: Orders

## 2022-11-10 NOTE — Telephone Encounter (Signed)
Updated pharmacy information for [facility]

## 2022-11-10 NOTE — Telephone Encounter (Signed)
I contacted patient and she is okay with trying Trazodone. She would like 30 days [facility] to [id number] [place], [place] - [address]   I have updated the pharmacy in her chart.

## 2022-11-16 MED ORDER — TEMAZEPAM 30 MG PO CAPS: ORAL_CAPSULE | ORAL | 0 refills | Status: DC

## 2022-11-16 NOTE — Telephone Encounter (Signed)
Patient called today stating that she picked up the Trazodone and she thinks that she is allergic to it, because after taking it it every nerve in her body was twitching and she was itching and was not able to sleep.  She wants to know if you can send in her Temazepam to the [facility] pharmacy and her sister can go pick it up and send it to her overnight through the mail.

## 2022-11-16 NOTE — Telephone Encounter (Signed)
Refill of temazepam sent to [facility].

## 2022-11-16 NOTE — Addendum Note (Signed)
Addended byChristen Butter on: 11/16/2022 05:02 PM   Modules accepted: Orders

## 2022-11-22 ENCOUNTER — Other Ambulatory Visit: Payer: Self-pay

## 2022-11-22 MED ORDER — VALSARTAN-HYDROCHLOROTHIAZIDE 80-12.5 MG PO TABS: 1.0000 | ORAL_TABLET | Freq: Every day | ORAL | 0 refills | Status: DC

## 2022-11-28 ENCOUNTER — Ambulatory Visit: Payer: Medicare Other | Admitting: Medical-Surgical

## 2022-11-28 ENCOUNTER — Ambulatory Visit (HOSPITAL_COMMUNITY): Payer: Medicare Other | Admitting: Licensed Clinical Social Worker

## 2022-12-04 ENCOUNTER — Other Ambulatory Visit: Payer: Self-pay | Admitting: Medical-Surgical

## 2022-12-07 ENCOUNTER — Telehealth (HOSPITAL_COMMUNITY): Payer: Self-pay | Admitting: *Deleted

## 2022-12-07 MED ORDER — FLUOXETINE HCL 10 MG PO CAPS: 10.0000 mg | ORAL_CAPSULE | Freq: Every day | ORAL | 0 refills | Status: DC

## 2022-12-07 NOTE — Telephone Encounter (Signed)
Patient called requested refill & to change Rx [id number] [place], [place] - [address]    FLUoxetine (PROZAC) 10 MG capsule  Next appt 01/21/23 Last appt 10/10/22

## 2022-12-07 NOTE — Addendum Note (Signed)
Addended by: Renne Cornick on: 12/07/2022 09:48 AM   Modules accepted: Orders  

## 2022-12-11 ENCOUNTER — Ambulatory Visit (INDEPENDENT_AMBULATORY_CARE_PROVIDER_SITE_OTHER): Payer: Medicare Other | Admitting: Licensed Clinical Social Worker

## 2022-12-11 DIAGNOSIS — F4321 Adjustment disorder with depressed mood: Secondary | ICD-10-CM | POA: Diagnosis not present

## 2022-12-11 DIAGNOSIS — F332 Major depressive disorder, recurrent severe without psychotic features: Secondary | ICD-10-CM

## 2022-12-11 DIAGNOSIS — F411 Generalized anxiety disorder: Secondary | ICD-10-CM | POA: Diagnosis not present

## 2022-12-11 NOTE — Progress Notes (Signed)
THERAPIST PROGRESS NOTE  Session Time: 1:00 PM to 1:50 PM  Participation Level: Active  Behavioral Response: CasualAlertappropriate processing through feelings of visiting family disappointments and acceptance  Type of Therapy: Individual Therapy  Treatment Goals addressed: Work on PTSD, depression, anxiety, grief  ProgressTowards Goals: Progressing-patient gaining more insight to decisions that we will help her move forward and laying foundation for herself  Interventions: Solution Focused, Strength-based, Supportive, and Other: Coping  Summary: Heather Thompson is a 72 y.o. female who presents with up and down last time session in October. Went to see kids for Thanksgiving and Christmas. Realized that can't go home again. Realizes that she has to have her own place. Can't afford to have two places. It is not for her to live like a gypsy don't like don't like living at someone else's place need her own place. They went on their normal day and spent a lot of time alone not why went up there wanted to spend time with them, quality time and not the way life goes now. Asks herself why here nobody has time for her. Seeing son and his family had two Christmas didn't have in past nobody could arrange their schedule. Christmas morning was fun at [facility]. Then had to pack up and go to [employer] have to stay over night. Didn't like two Christmas had to sleep on the couch and wait for everybody to go to sleep. Wanted to figure out whether she belonged and she didn't. Supposed to go up and rest take care of hand most of the time took care of them. No signs of appreciation. Decided to stay here not stay for long periods with her family. Felt more missed from hand full of friends here glad she is staying here. Decided maybe not home maybe a home that can say in heart say home isn't anymore. Realize that home is where she is. Sees the same when thing when she went to grandparents. Have to come to  realization accept  after the disappointment of being up, not home not belong. Heather Thompson was with her everywhere with her before. Miss him more too many memories to the surface not there he should have been.  Tearful in describing this to therapist harder to be there. Hard here never been here. Only can tell him about being here. Got to accept not home but here in [place] is where she lives. Couple of medical things to deal with and then visit friends.  Going to [place] on cruise in the summer daughter-in-law's mom.  In reviewing session biggest thing realizing couldn't go back home that was a real disappointment slap in the face. Mom (patient) is not as important as wanted to be. Have their own life don't need her but love her. Loved feeling needed. Now she knows where going to be move forward. Unsettled for past two years where she wants to be. Next Wednesday a big esophagus test has issues clear throat and voice not being right at [facility].  Has to get hand better not better because used it. Go backwards with hands still healing preventing her to live still painful. Still have to take care of herself. Disappointment woke her up that don't belong don't need if go back want to be needed. Don't need her have their own life.     Reviewed significant events changes in symptoms as patient last seen in October.  Reviewed significant insight that has decided does not want to live part of the year where  family is living in the [place].  Through experience of visiting them has learned that her life has moved on not as much time for her.  Patient herself summarized cannot go home again she relies.  Therapist noted difficulty of change can be painful reframe to look at well now she knows where she is going to settle down and therapist guided patient in continuing to work on developing a life for herself in different ways helps when she knows where she is going to be for a while.  She appreciates the positives a friend she is made here she also  will make plans which therapist is encouraging to create a life a new chapter.  Therapist provided space and support for patient to talk about thoughts and feelings in session.  Suicidal/Homicidal: No  Plan: Return again in 2  weeks.2.weeks work on cognitive distortions, grief issues, copin  Diagnosis: major depressive disorder, recurrent, severe, generalized anxiety disorder, grief  Collaboration of Care: Other none needed  Patient/Guardian was advised Release of Information must be obtained prior to any record release in order to collaborate their care with an outside provider. Patient/Guardian was advised if they have not already done so to contact the registration department to sign all necessary forms in order for [place] to release information regarding their care.   Consent: Patient/Guardian gives verbal consent for treatment and assignment of benefits for services provided during this visit. Patient/Guardian expressed understanding and agreed to proceed.   Cordella Register, LCSW 12/11/2022

## 2022-12-12 ENCOUNTER — Ambulatory Visit (INDEPENDENT_AMBULATORY_CARE_PROVIDER_SITE_OTHER): Payer: Medicare Other | Admitting: Medical-Surgical

## 2022-12-12 VITALS — BP 156/62 | HR 80

## 2022-12-12 DIAGNOSIS — E538 Deficiency of other specified B group vitamins: Secondary | ICD-10-CM

## 2022-12-12 MED ORDER — LISINOPRIL-HYDROCHLOROTHIAZIDE 10-12.5 MG PO TABS: 1.0000 | ORAL_TABLET | Freq: Every day | ORAL | 3 refills | Status: DC

## 2022-12-12 MED ORDER — CYANOCOBALAMIN 1000 MCG/ML IJ SOLN
1000.0000 ug | Freq: Once | INTRAMUSCULAR | Status: AC
  Administered 2022-12-12: 1000 ug via INTRAMUSCULAR

## 2022-12-12 NOTE — Progress Notes (Signed)
Established Patient Office Visit  Subjective   Patient ID: Heather Thompson, female    DOB: 16-Apr-1951  Age: 72 y.o. MRN: [id number]  Chief Complaint  Patient presents with   Pernicious Anemia    HPI  Heather Thompson is here for a vitamin B 12 injection. Denies muscle cramps, weakness or irregular heart rate.   ROS    Objective:     BP (!) 156/62   Pulse 80    Physical Exam   No results found for any visits on 12/12/22.    The 10-year ASCVD risk score (Arnett DK, et al., 2019) is: 22%    Assessment & Plan:  B12 injection - Patient tolerated injection well without complications. Patient advised to schedule next injection 30 days from today.    Hypertension - blood pressure elevated. Heather Thompson is requesting to go back on Lisinopril. She uses [facility] in [place].   Problem List Items Addressed This Visit   None Visit Diagnoses     B12 deficiency    -  Primary   Relevant Medications   cyanocobalamin (VITAMIN B12) injection 1,000 mcg (Completed)       Return in about 30 days (around 01/11/2023) for B12 injection. Heather Thompson, Heather Thompson, CMA

## 2022-12-12 NOTE — Progress Notes (Signed)
Agree with documentation as below.  On evaluation, patient's blood pressure still elevated.  He desires to switch back to lisinopril-hydrochlorothiazide 10-12.5 mg daily that she was previously taking.  This was discontinued as a suspected contributor to her cough but her cough has been resolved and found to be related to something else.  Switching back today.  Discontinue valsartan-hydrochlorothiazide.  New prescription sent to the pharmacy.  ___________________________________________ Clearnce Sorrel, DNP, APRN, FNP-BC Primary Care and Sports Medicine [place]

## 2022-12-16 ENCOUNTER — Other Ambulatory Visit: Payer: Self-pay | Admitting: Medical-Surgical

## 2022-12-19 ENCOUNTER — Ambulatory Visit (INDEPENDENT_AMBULATORY_CARE_PROVIDER_SITE_OTHER): Payer: Medicare Other | Admitting: Medical-Surgical

## 2022-12-19 ENCOUNTER — Encounter: Payer: Self-pay | Admitting: Medical-Surgical

## 2022-12-19 VITALS — BP 143/76 | HR 80 | Resp 20 | Ht 64.0 in | Wt 181.0 lb

## 2022-12-19 DIAGNOSIS — E785 Hyperlipidemia, unspecified: Secondary | ICD-10-CM

## 2022-12-19 DIAGNOSIS — G4700 Insomnia, unspecified: Secondary | ICD-10-CM

## 2022-12-19 DIAGNOSIS — G43909 Migraine, unspecified, not intractable, without status migrainosus: Secondary | ICD-10-CM | POA: Diagnosis not present

## 2022-12-19 DIAGNOSIS — I10 Essential (primary) hypertension: Secondary | ICD-10-CM

## 2022-12-19 DIAGNOSIS — E039 Hypothyroidism, unspecified: Secondary | ICD-10-CM | POA: Diagnosis not present

## 2022-12-19 DIAGNOSIS — R7303 Prediabetes: Secondary | ICD-10-CM

## 2022-12-19 DIAGNOSIS — F5104 Psychophysiologic insomnia: Secondary | ICD-10-CM

## 2022-12-19 DIAGNOSIS — R42 Dizziness and giddiness: Secondary | ICD-10-CM

## 2022-12-19 DIAGNOSIS — F322 Major depressive disorder, single episode, severe without psychotic features: Secondary | ICD-10-CM

## 2022-12-19 DIAGNOSIS — E538 Deficiency of other specified B group vitamins: Secondary | ICD-10-CM

## 2022-12-19 DIAGNOSIS — E611 Iron deficiency: Secondary | ICD-10-CM

## 2022-12-19 DIAGNOSIS — Z1231 Encounter for screening mammogram for malignant neoplasm of breast: Secondary | ICD-10-CM

## 2022-12-19 MED ORDER — NYSTATIN 100000 UNIT/GM EX POWD: CUTANEOUS | 0 refills | Status: DC

## 2022-12-19 MED ORDER — OXYBUTYNIN CHLORIDE ER 5 MG PO TB24: 5.0000 mg | ORAL_TABLET | Freq: Every day | ORAL | 0 refills | Status: DC

## 2022-12-19 MED ORDER — BUPROPION HCL ER (XL) 300 MG PO TB24: 300.0000 mg | ORAL_TABLET | Freq: Every day | ORAL | 0 refills | Status: DC

## 2022-12-19 MED ORDER — TEMAZEPAM 22.5 MG PO CAPS: ORAL_CAPSULE | ORAL | 1 refills | Status: DC

## 2022-12-19 NOTE — Progress Notes (Signed)
Established Patient Office Visit  Subjective   Patient ID: Heather Thompson, female   DOB: Dec 07, 1950 Age: 72 y.o. MRN: [id number]   Chief Complaint  Patient presents with   Follow-up   Hypertension   HPI This is a pleasant 72 year old female presenting today for the following:  Hypertension: Switched back to lisinopril-hydrochlorothiazide 20-25 mg daily, tolerating well without side effects.  Feels that her blood pressure has come down some although it is still a little higher than the recommended goal of 130/80 or less.  Following a low-sodium diet.  Exercise limited by extreme fatigue and severe pain. Denies CP, SOB, palpitations, lower extremity edema, dizziness, headaches, or vision changes.  Mood: Followed by psychiatry.  Has an upcoming appointment with him but is almost out of Wellbutrin.  Would like to continue this medication as she feels it is beneficial.  Also taking Pristiq 100 mg daily along with hydroxyzine 10 mg nightly.  She is doing counseling as recommended.  Continues to have issues with extreme fatigue and oversleeping.  Plans to discuss her symptoms with psychiatry as she feels that she may be overmedicated.  Chronic pain: She is currently followed by pain management has a pain contract.  Using Dilaudid 2 mg once daily along with tizanidine 2 mg as needed.  Reports that she is still in continuous pain all the time and various areas of her body.  Has questions about CBD oil/Gummies and if they would be appropriate for her to consider.  As noted above, she has difficulty with oversleeping and notes that some days she is unable to get out of bed because she is just too tired.  She has extreme chronic fatigue and has found herself falling asleep extremely easily in the afternoon.  She is taking afternoon naps almost daily.  She does sleep fairly well with the use of temazepam and hydroxyzine.  Wakes several times to void but not excessively so.  She reports that she has canceled  going places because she feels she is too tired.  She does have a history of urge incontinence and reports that she was previously taking Ditropan.  It looks like she did not refill it recently and so she is out.  Having issues with feeling the urge to urinate and walking to the bathroom immediately but before she can get on the toilet, she has urine leakage.  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  At that weight, her family and friends told her she looked very sick and encouraged her to gain some weight back.  She gained back to 166 and felt a little better.  Now she has put on approximately 20 pounds and would like my opinion on what a healthy weight for her is.  Has been occasionally dizzy off and on and feels off balance at times.  This only last for a second and is most noticeable when changing from a sitting to a standing position.  She has not had any falls or injuries.  No loss of consciousness.  Reports staying very well-hydrated.  Feels that she has gotten quite weak and has lost strength overall.  She had bilateral hand surgeries and has been limited in her ability to use her upper extremities.  Had to move to a different location and noted that it was very difficult for her to lift and move boxes that had previously been moved with ease.  Wonders if this is related to  her surgeries, her osteoarthritis, or her chronic pain.   Objective:    Vitals:   12/19/22 1029  BP: (!) 143/76  Pulse: 80  Resp: 20  Height: 5\' 4"  (1.626 m)  Weight: 181 lb (82.1 kg)  SpO2: 98%  BMI (Calculated): 31.05    Physical Exam Vitals and nursing note reviewed.  Constitutional:      General: She is not in acute distress.    Appearance: Normal appearance. She is obese. She is not ill-appearing.  HENT:     Head: Normocephalic and atraumatic.  Cardiovascular:     Rate and Rhythm: Normal rate and regular rhythm.     Pulses: Normal pulses.     Heart  sounds: Normal heart sounds.  Pulmonary:     Effort: Pulmonary effort is normal. No respiratory distress.     Breath sounds: Normal breath sounds. No wheezing, rhonchi or rales.  Skin:    General: Skin is warm and dry.  Neurological:     Mental Status: She is alert and oriented to person, place, and time.  Psychiatric:        Mood and Affect: Mood normal.        Behavior: Behavior normal.        Thought Content: Thought content normal.        Judgment: Judgment normal.      No results found for this or any previous visit (from the past 24 hour(s)).     The 10-year ASCVD risk score (Arnett DK, et al., 2019) is: 18.8%   Values used to calculate the score:     Age: 43 years     Sex: Female     Is Non-Hispanic African American: No     Diabetic: No     Tobacco smoker: No     Systolic Blood Pressure: 143 mmHg     Is BP treated: Yes     HDL Cholesterol: 91 mg/dL     Total Cholesterol: 246 mg/dL   Assessment & Plan:   1. Primary hypertension Checking labs as below.  Blood pressure slightly elevated today.  Continue lisinopril-hydrochlorothiazide 10-12.5 mg daily.  Monitor blood pressure at home with a goal of 130/80.  If it continues to run higher than that, we will need to increase her dosage of the medication.  Low-sodium diet recommended.  Weight loss to around 160 pounds recommended.  Recommend increased activity as tolerated. - CBC - COMPLETE METABOLIC PANEL WITH GFR - Lipid Panel w/reflex Direct LDL  2. Migraine without status migrainosus, not intractable, unspecified migraine type Continue propranolol as prescribed.  3. Acquired hypothyroidism Checking TSH. - TSH  4. Hyperlipidemia, unspecified hyperlipidemia type Checking lipids.  Continue Pravachol 10 mg daily.  5. Vitamin B12 deficiency Checking vitamin B12. - Vitamin B12  6. Low serum iron Checking iron panel. - Iron, TIBC and Ferritin Panel  7.  Urgent continence Resume Ditropan XL 5 mg nightly.  8.  Prediabetes Checking hemoglobin A1c. - Hemoglobin A1c  9. Insomnia, unspecified type With her complaint of oversleeping and difficulty getting up in the morning, would like her to hold off on hydroxyzine unless absolutely necessary for racing thoughts.  Decreasing temazepam to 22.5 mg nightly. - temazepam (RESTORIL) 22.5 MG capsule; TAKE ONE CAPSULE BY MOUTH AT NIGHT  Dispense: 30 capsule; Refill: 1  10. Encounter for screening mammogram for malignant neoplasm of breast Mammogram ordered. - MM DIGITAL SCREENING BILATERAL; Future  11. Dizzy spells Unclear etiology.  This is intermittent and only last  for about a second so it is difficult to pinpoint a possible cause.  Work stay very well-hydrated.  Monitor blood pressure to make sure it is not dropping too low with position changes.  Monitor for any additional symptoms that could help pinpoint what is going on.  12.  Severe major depression Managed by psychiatry.  Refilling Wellbutrin at patient request.  Return in about 6 months (around 06/19/2023) for chronic disease follow up.  ___________________________________________ Thayer Ohm, DNP, APRN, FNP-BC Primary Care and Sports Medicine [facility] [employer]

## 2022-12-20 LAB — COMPLETE METABOLIC PANEL WITH GFR
AG Ratio: 1.6 (calc) (ref 1.0–2.5)
ALT: 25 U/L (ref 6–29)
AST: 28 U/L (ref 10–35)
Albumin: 4.7 g/dL (ref 3.6–5.1)
Alkaline phosphatase (APISO): 96 U/L (ref 37–153)
BUN: 13 mg/dL (ref 7–25)
CO2: 30 mmol/L (ref 20–32)
Calcium: 9.8 mg/dL (ref 8.6–10.4)
Chloride: 97 mmol/L — ABNORMAL LOW (ref 98–110)
Creat: 0.69 mg/dL (ref 0.60–1.00)
Globulin: 2.9 g/dL (calc) (ref 1.9–3.7)
Glucose, Bld: 103 mg/dL — ABNORMAL HIGH (ref 65–99)
Potassium: 4.1 mmol/L (ref 3.5–5.3)
Sodium: 136 mmol/L (ref 135–146)
Total Bilirubin: 0.5 mg/dL (ref 0.2–1.2)
Total Protein: 7.6 g/dL (ref 6.1–8.1)
eGFR: 92 mL/min/{1.73_m2} (ref >=60)

## 2022-12-20 LAB — TSH: TSH: 2.59 mIU/L (ref 0.40–4.50)

## 2022-12-20 LAB — HEMOGLOBIN A1C
Hgb A1c MFr Bld: 6.2 % of total Hgb — ABNORMAL HIGH (ref <5.7)
Mean Plasma Glucose: 131 mg/dL
eAG (mmol/L): 7.3 mmol/L

## 2022-12-20 LAB — IRON,TIBC AND FERRITIN PANEL
%SAT: 25 % (calc) (ref 16–45)
Ferritin: 149 ng/mL (ref 16–288)
Iron: 102 ug/dL (ref 45–160)
TIBC: 411 mcg/dL (calc) (ref 250–450)

## 2022-12-20 LAB — LIPID PANEL W/REFLEX DIRECT LDL
Cholesterol: 230 mg/dL — ABNORMAL HIGH (ref <200)
HDL: 95 mg/dL (ref >=50)
LDL Cholesterol (Calc): 111 mg/dL (calc) — ABNORMAL HIGH
Non-HDL Cholesterol (Calc): 135 mg/dL (calc) — ABNORMAL HIGH (ref <130)
Total CHOL/HDL Ratio: 2.4 (calc) (ref <5.0)
Triglycerides: 128 mg/dL (ref <150)

## 2022-12-20 LAB — CBC
HCT: 39.8 % (ref 35.0–45.0)
Hemoglobin: 13.3 g/dL (ref 11.7–15.5)
MCH: 30.4 pg (ref 27.0–33.0)
MCHC: 33.4 g/dL (ref 32.0–36.0)
MCV: 90.9 fL (ref 80.0–100.0)
MPV: 11.2 fL (ref 7.5–12.5)
Platelets: 232 10*3/uL (ref 140–400)
RBC: 4.38 10*6/uL (ref 3.80–5.10)
RDW: 11.8 % (ref 11.0–15.0)
WBC: 4.7 10*3/uL (ref 3.8–10.8)

## 2022-12-20 LAB — VITAMIN B12: Vitamin B-12: 1409 pg/mL — ABNORMAL HIGH (ref 200–1100)

## 2022-12-21 MED ORDER — PRAVASTATIN SODIUM 20 MG PO TABS: 20.0000 mg | ORAL_TABLET | Freq: Every day | ORAL | 1 refills | Status: DC

## 2022-12-21 NOTE — Addendum Note (Signed)
Addended bySamuel Bouche on: 12/21/2022 12:50 PM   Modules accepted: Orders

## 2022-12-22 ENCOUNTER — Inpatient Hospital Stay: Payer: Medicare Other

## 2022-12-22 ENCOUNTER — Inpatient Hospital Stay: Payer: Medicare Other | Admitting: Family

## 2022-12-25 ENCOUNTER — Ambulatory Visit (HOSPITAL_COMMUNITY): Payer: Medicare Other | Admitting: Licensed Clinical Social Worker

## 2022-12-26 ENCOUNTER — Ambulatory Visit (HOSPITAL_COMMUNITY): Payer: Medicare Other | Admitting: Licensed Clinical Social Worker

## 2022-12-28 ENCOUNTER — Telehealth: Payer: Self-pay

## 2022-12-28 NOTE — Telephone Encounter (Signed)
The patient called asking for a referral or suggestions for a dietician or a nutritionist based on her newest blood work. She needs help with meal planning.

## 2023-01-08 ENCOUNTER — Ambulatory Visit (HOSPITAL_COMMUNITY): Payer: Medicare Other | Admitting: Licensed Clinical Social Worker

## 2023-01-09 ENCOUNTER — Ambulatory Visit (INDEPENDENT_AMBULATORY_CARE_PROVIDER_SITE_OTHER): Payer: Medicare Other | Admitting: Medical-Surgical

## 2023-01-09 VITALS — BP 139/68 | HR 57 | Ht 64.0 in | Wt 181.0 lb

## 2023-01-09 DIAGNOSIS — E538 Deficiency of other specified B group vitamins: Secondary | ICD-10-CM

## 2023-01-09 MED ORDER — CYANOCOBALAMIN 1000 MCG/ML IJ SOLN
1000.0000 ug | Freq: Once | INTRAMUSCULAR | Status: AC
  Administered 2023-01-09: 1000 ug via INTRAMUSCULAR

## 2023-01-09 NOTE — Progress Notes (Signed)
Agree with documentation as below.  ___________________________________________ Janaria Mccammon L. Jeremy Mclamb, DNP, APRN, FNP-BC Primary Care and Sports Medicine [facility]  

## 2023-01-09 NOTE — Progress Notes (Signed)
Subjective:    Patient ID: Heather Thompson, female    DOB: 10/24/1951, 72 y.o.   MRN: [id number]  HPI Pt here for b12 injection, pt denies muscle cramps, weakness or irregular heart rate.    Review of Systems     Objective:   Physical Exam        Assessment & Plan:  Pt tolerated injection well in right deltoid, pt advised to schedule next injection in 1 month.

## 2023-01-10 ENCOUNTER — Ambulatory Visit: Payer: Medicare Other

## 2023-01-11 ENCOUNTER — Ambulatory Visit (INDEPENDENT_AMBULATORY_CARE_PROVIDER_SITE_OTHER): Payer: Medicare Other | Admitting: Psychiatry

## 2023-01-11 ENCOUNTER — Encounter (HOSPITAL_COMMUNITY): Payer: Self-pay | Admitting: Psychiatry

## 2023-01-11 VITALS — BP 136/74 | HR 70 | Temp 98.2°F | Ht 64.0 in | Wt 180.0 lb

## 2023-01-11 DIAGNOSIS — F5102 Adjustment insomnia: Secondary | ICD-10-CM | POA: Diagnosis not present

## 2023-01-11 DIAGNOSIS — F4321 Adjustment disorder with depressed mood: Secondary | ICD-10-CM | POA: Diagnosis not present

## 2023-01-11 DIAGNOSIS — F411 Generalized anxiety disorder: Secondary | ICD-10-CM

## 2023-01-11 DIAGNOSIS — F322 Major depressive disorder, single episode, severe without psychotic features: Secondary | ICD-10-CM

## 2023-01-11 DIAGNOSIS — F332 Major depressive disorder, recurrent severe without psychotic features: Secondary | ICD-10-CM

## 2023-01-11 MED ORDER — HYDROXYZINE HCL 10 MG PO TABS: 10.0000 mg | ORAL_TABLET | Freq: Every evening | ORAL | 0 refills | Status: DC | PRN

## 2023-01-11 MED ORDER — DESVENLAFAXINE SUCCINATE ER 100 MG PO TB24: 100.0000 mg | ORAL_TABLET | Freq: Every day | ORAL | 0 refills | Status: DC
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
## 2023-01-15 ENCOUNTER — Ambulatory Visit (HOSPITAL_COMMUNITY): Payer: Medicare Other | Admitting: Licensed Clinical Social Worker

## 2023-01-18 ENCOUNTER — Ambulatory Visit (INDEPENDENT_AMBULATORY_CARE_PROVIDER_SITE_OTHER): Payer: Medicare Other

## 2023-01-18 DIAGNOSIS — Z1231 Encounter for screening mammogram for malignant neoplasm of breast: Secondary | ICD-10-CM | POA: Diagnosis not present

## 2023-01-23 ENCOUNTER — Ambulatory Visit
Admission: EM | Admit: 2023-01-23 | Discharge: 2023-01-23 | Disposition: A | Discharge status: Home / Self Care | Payer: Medicare Other | Attending: Family Medicine | Admitting: Family Medicine

## 2023-01-23 ENCOUNTER — Telehealth: Payer: Self-pay

## 2023-01-23 ENCOUNTER — Ambulatory Visit (INDEPENDENT_AMBULATORY_CARE_PROVIDER_SITE_OTHER): Payer: Medicare Other

## 2023-01-23 ENCOUNTER — Encounter: Payer: Self-pay | Admitting: Emergency Medicine

## 2023-01-23 ENCOUNTER — Other Ambulatory Visit: Payer: Self-pay | Admitting: Medical-Surgical

## 2023-01-23 DIAGNOSIS — S61214A Laceration without foreign body of right ring finger without damage to nail, initial encounter: Secondary | ICD-10-CM | POA: Diagnosis not present

## 2023-01-23 DIAGNOSIS — Z23 Encounter for immunization: Secondary | ICD-10-CM

## 2023-01-23 DIAGNOSIS — S61219A Laceration without foreign body of unspecified finger without damage to nail, initial encounter: Secondary | ICD-10-CM

## 2023-01-23 MED ORDER — TETANUS-DIPHTH-ACELL PERTUSSIS 5-2.5-18.5 LF-MCG/0.5 IM SUSY
0.5000 mL | PREFILLED_SYRINGE | Freq: Once | INTRAMUSCULAR | Status: AC
  Administered 2023-01-23: 0.5 mL via INTRAMUSCULAR

## 2023-01-23 NOTE — Telephone Encounter (Signed)
Patient was seen for her B12 injection and the nurse had the wrong patient and then gave her the B12 injection and she requested a refill on her Vitamin D at that visit but the nurse got patients mixed up and she's not sure that the message was relayed to you.  Patient called requesting a refill on her Vitamin D.   4 capsules once a week.

## 2023-01-23 NOTE — Discharge Instructions (Addendum)
I did not find glass on examination.  There is no glass that shows on the x-ray.  Keep clean, put bandages on open areas.  Return as needed  Tetanus updated

## 2023-01-23 NOTE — ED Provider Notes (Signed)
[facility]    CSN: [id number] Arrival date & time: 01/23/23  1121      History   Chief Complaint Chief Complaint  Patient presents with   Laceration    HPI Heather Thompson is a 72 y.o. female.   HPI  Patient was pulling a large crystal punch bowl off of a shelf and it shattered.  She has multiple small lacerations on her right hand.  She is worried there may be glass shards in the wounds.  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[place]) 09/05/2022   Primary osteoarthritis of both first carpometacarpal joints 12/21/2021   Primary osteoarthritis of both knees 12/21/2021   Port-A-Cath in place 07/29/2021   Acquired hypothyroidism 07/29/2021   Chronic fatigue 07/29/2021   Vitamin B12 deficiency 07/29/2021   Vitamin D deficiency 07/29/2021   Postmenopausal estrogen deficiency 07/29/2021   IBS (irritable bowel syndrome) 04/06/2021   Retention of urine 04/06/2021   Herniated lumbar intervertebral disc 11/12/2020   Skin sensation disturbance 11/12/2020   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   Spondylopathy, unspecified 11/12/2020   Trochanteric bursitis, left hip 11/19/2019   Trochanteric bursitis, right hip 11/19/2019   Low serum iron 04/23/2019   Osteomalacia 04/23/2019   Abnormality of gait 04/23/2019   Presence of right artificial hip joint 06/26/2018   Gastroesophageal reflux disease 06/18/2018   Hypertension 06/18/2018   Intertrigo 04/30/2018   Lentigines 04/30/2018   Hyponatremia 01/17/2018   History of left hip replacement 01/14/2018   History of cervical spinal surgery 01/01/2018   Obesity (BMI 30-39.9)  01/01/2018   SK (seborrheic keratosis) 04/18/2017   Pain management contract agreement 11/17/2016   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   Allergic rhinitis due to animal hair and dander 12/02/2015   Low back pain 03/03/2015   Urinary incontinence 10/30/2013   H/O gastric bypass 0000000   Neutrophilic leukocytosis 0000000   Thrombocytosis 08/12/2012   Osteoporosis, senile 11/15/2010   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   Hyperlipidemia 08/19/2010   Impaired fasting glucose 08/19/2010   Polyarthritis 09/28/2009   Degenerative disc disease 04/07/2009   Depression with anxiety 04/07/2009   Myopathy 04/07/2009   Insomnia 04/07/2009   Migraine 04/07/2009   Scoliosis 04/07/2009   Seasonal allergies 04/07/2009   Vitamin B12 deficiency anemia 04/07/2009    Past Surgical History:  Procedure Laterality Date   ABDOMINAL HYSTERECTOMY     APPENDECTOMY     CERVICAL FUSION     CHOLECYSTECTOMY     FOOT SURGERY Bilateral    fusion   TOTAL HIP ARTHROPLASTY Bilateral    11/2016 & 05/2017    OB History   No obstetric history on file.      Home Medications    Prior to Admission medications   Medication Sig Start Date End Date Taking? Authorizing Provider  buPROPion (WELLBUTRIN XL) 300 MG 24 hr tablet Take 1 tablet (300 mg total) by mouth daily. 12/19/22   Samuel Bouche, NP  desvenlafaxine (PRISTIQ) 100 MG 24 hr tablet Take 1 tablet (100 mg total) by mouth daily. 01/11/23   Merian Capron, MD  FLUoxetine (PROZAC) 10  MG capsule Take 1 capsule (10 mg total) by mouth daily. 12/07/22 12/07/23  Merian Capron, MD  HYDROmorphone (DILAUDID) 2 MG tablet Take 2 mg by mouth daily. 07/12/22   [provider]  hydrOXYzine (ATARAX) 10 MG tablet Take 1 tablet (10 mg total) by mouth at bedtime as needed. 01/11/23   Merian Capron, MD  lisinopril-hydrochlorothiazide (ZESTORETIC) 10-12.5 MG tablet Take 1 tablet by mouth daily. 12/12/22   Samuel Bouche, NP  Magnesium 250 MG TABS Take 1 tablet by mouth daily.     [provider]  melatonin 5 MG TABS Take 10 mg by mouth at bedtime.    [provider]  nystatin (MYCOSTATIN/NYSTOP) powder APPLY TO THE AFFECTED AREA(S) EXTERNALLY TWICE DAILY 12/19/22   Samuel Bouche, NP  oxybutynin (DITROPAN-XL) 5 MG 24 hr tablet Take 1 tablet (5 mg total) by mouth at bedtime. 12/19/22   Samuel Bouche, NP  pantoprazole (PROTONIX) 40 MG tablet Take 40 mg by mouth 2 (two) times daily before a meal. 05/10/22   [provider]  pravastatin (PRAVACHOL) 20 MG tablet Take 1 tablet (20 mg total) by mouth daily. TAKE 1 TABLET(10 MG) BY MOUTH DAILY 12/21/22   Samuel Bouche, NP  propranolol (INDERAL) 20 MG tablet TAKE 1 TABLET(20 MG) BY MOUTH DAILY AS NEEDED Patient not taking: Reported on 01/23/2023 02/03/22   Samuel Bouche, NP  temazepam (RESTORIL) 22.5 MG capsule TAKE ONE CAPSULE BY MOUTH AT NIGHT 12/19/22   Samuel Bouche, NP  tiZANidine (ZANAFLEX) 2 MG tablet Take 2 mg by mouth in the morning and at bedtime. 07/12/22   [provider]    Family History Family History  Problem Relation Age of Onset   Hypertension Mother    Heart attack Mother    Lung cancer Father    Prostate cancer Father    Kidney cancer Brother    Healthy Son    Healthy Daughter     Social History Social History   Tobacco Use   Smoking status: Never    Passive exposure: Past   Smokeless tobacco: Never  Vaping Use   Vaping Use: Never used  Substance Use Topics   Alcohol use: Not Currently    Comment: rarely   Drug use: Never     Allergies   Compazine [prochlorperazine], Phenothiazines, Pregabalin, and Prednisone   Review of Systems Review of Systems See HPI  Physical Exam Triage Vital Signs ED Triage Vitals  Enc Vitals Group     BP 01/23/23 1235 138/81     Pulse Rate 01/23/23 1235 (!) 56     Resp 01/23/23 1235 18     Temp 01/23/23 1235 98.6 F (37 C)     Temp Source 01/23/23 1235 Oral     SpO2 01/23/23 1235 99 %     Weight 01/23/23 1240 181 lb (82.1 kg)      Height --      Head Circumference --      Peak Flow --      Pain Score 01/23/23 1240 3     Pain Loc --      Pain Edu? --      Excl. in [place]? --    No data found.  Updated Vital Signs BP 138/81 (BP Location: Left Arm)   Pulse (!) 56   Temp 98.6 F (37 C) (Oral)   Resp 18   Wt 82.1 kg   SpO2 99%   BMI 31.07 kg/m     Physical Exam Constitutional:  General: She is not in acute distress.    Appearance: She is well-developed.  HENT:     Head: Normocephalic and atraumatic.  Eyes:     Conjunctiva/sclera: Conjunctivae normal.     Pupils: Pupils are equal, round, and reactive to light.  Cardiovascular:     Rate and Rhythm: Normal rate.  Pulmonary:     Effort: Pulmonary effort is normal. No respiratory distress.  Abdominal:     General: There is no distension.     Palpations: Abdomen is soft.  Musculoskeletal:        General: Normal range of motion.     Cervical back: Normal range of motion.  Skin:    General: Skin is warm and dry.     Comments: Right palm has multiple small lacerations, the longest measures 5 mm.  Superficial probing with forceps does not reveal any foreign bodies.  X-rays performed and are negative.  Neurological:     Mental Status: She is alert.      UC Treatments / Results  Labs (all labs ordered are listed, but only abnormal results are displayed) Labs Reviewed - No data to display  EKG   Radiology DG Hand 2 View Right  Result Date: 01/23/2023 CLINICAL DATA:  Broken glass in lacerations. Distal ring finger, rule out foreign body EXAM: RIGHT HAND - 2 VIEW COMPARISON:  None Available. FINDINGS: There is no evidence of fracture or dislocation. No radiopaque foreign body identified. There is arthritis involving the interphalangeal joints and first carpometacarpal joint with central erosions and ankylosis. IMPRESSION: 1. No radiopaque foreign body identified. 2. No acute fracture or dislocation. 3. Erosive osteoarthritis. Electronically Signed   By:  Keane Police D.O.   On: 01/23/2023 13:38    Procedures Procedures (including critical care time)  Medications Ordered in UC Medications  Tdap (BOOSTRIX) injection 0.5 mL (0.5 mLs Intramuscular Given 01/23/23 1318)    Initial Impression / Assessment and Plan / UC Course  I have reviewed the triage vital signs and the nursing notes.  Pertinent labs & imaging results that were available during my care of the patient were reviewed by me and considered in my medical decision making (see chart for details).     Final Clinical Impressions(s) / UC Diagnoses   Final diagnoses:  Laceration of finger of right hand without damage to nail, initial encounter     Discharge Instructions      I did not find glass on examination.  There is no glass that shows on the x-ray.  Keep clean, put bandages on open areas.  Return as needed  Tetanus updated     ED Prescriptions   None    PDMP not reviewed this encounter.   Raylene Everts, MD 01/23/23 [phone]

## 2023-01-23 NOTE — ED Triage Notes (Signed)
Lacerations x 4 to right hand  Pt states a  crystal bowl shattered at home 1 hour pta when she was pulling it off the shelf at home  Pt believes there is glass in the lacerations Bleeding is controlled Tdap within  10 years

## 2023-01-24 ENCOUNTER — Telehealth: Payer: Self-pay | Admitting: Emergency Medicine

## 2023-01-24 NOTE — Telephone Encounter (Signed)
Call to see how Heather Thompson was today - no questions or concerns- lacerations on hand are healing. Thanked Therapist, sports for the call

## 2023-01-28 IMAGING — DX DG FOOT COMPLETE 3+V*R*
3 series · 3 of 3 positions shown · non-contrast
Comparison: None.

CLINICAL DATA: Trauma to the right great toe.

EXAM:
RIGHT FOOT COMPLETE - 3+ VIEW

[foot ap]
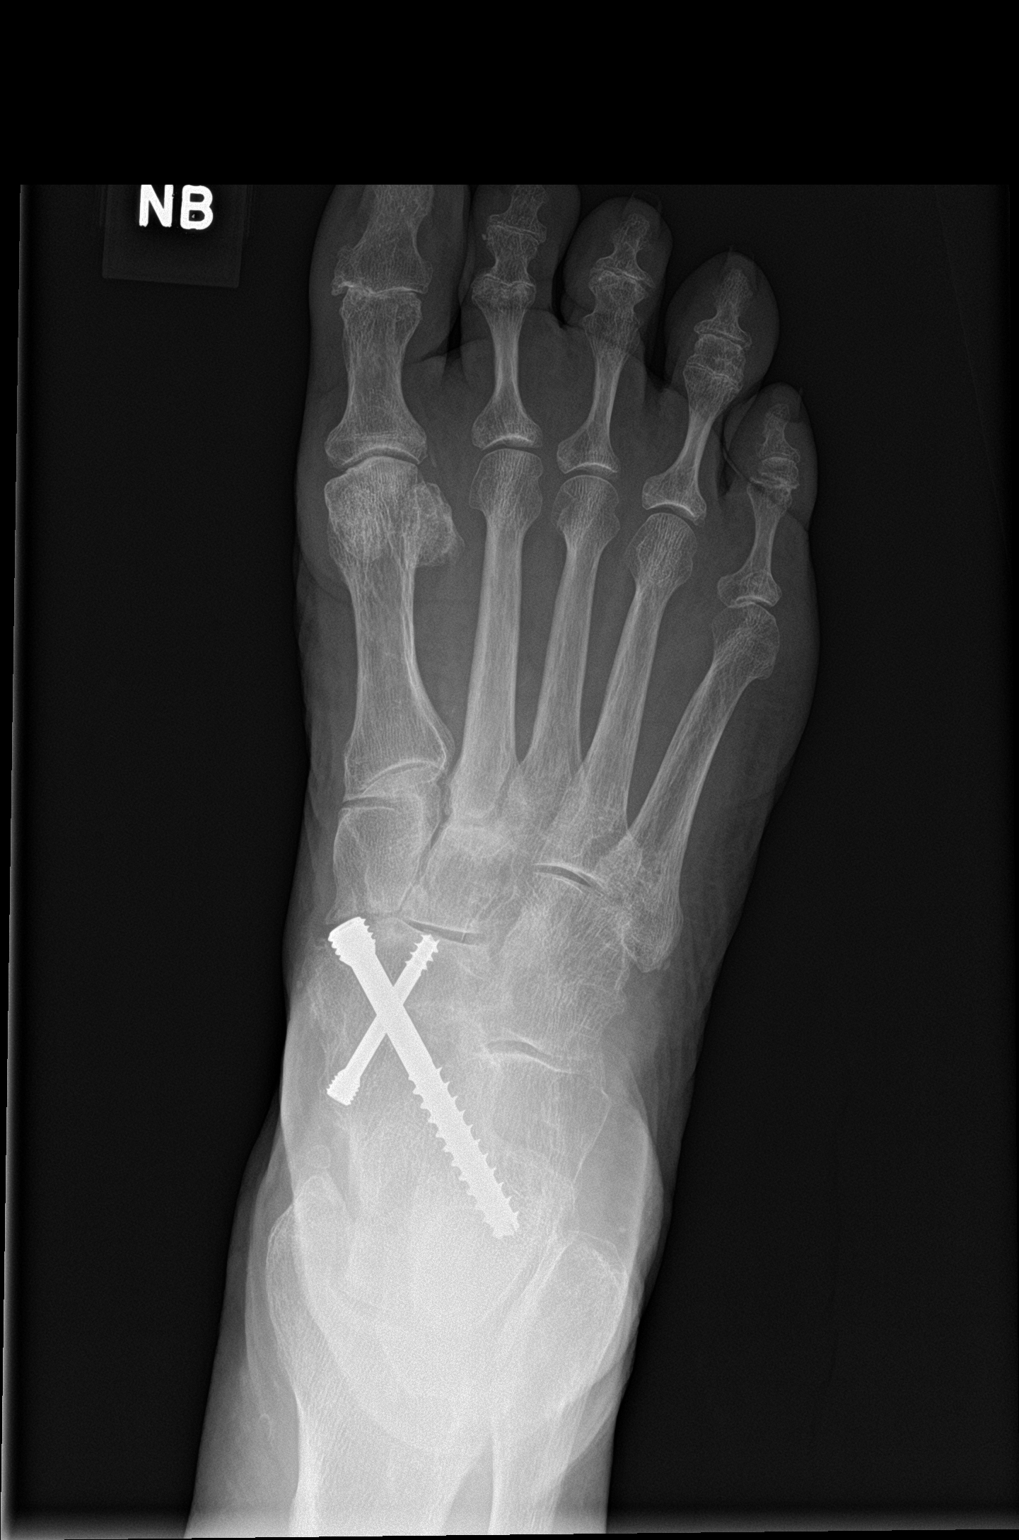

[foot obl]
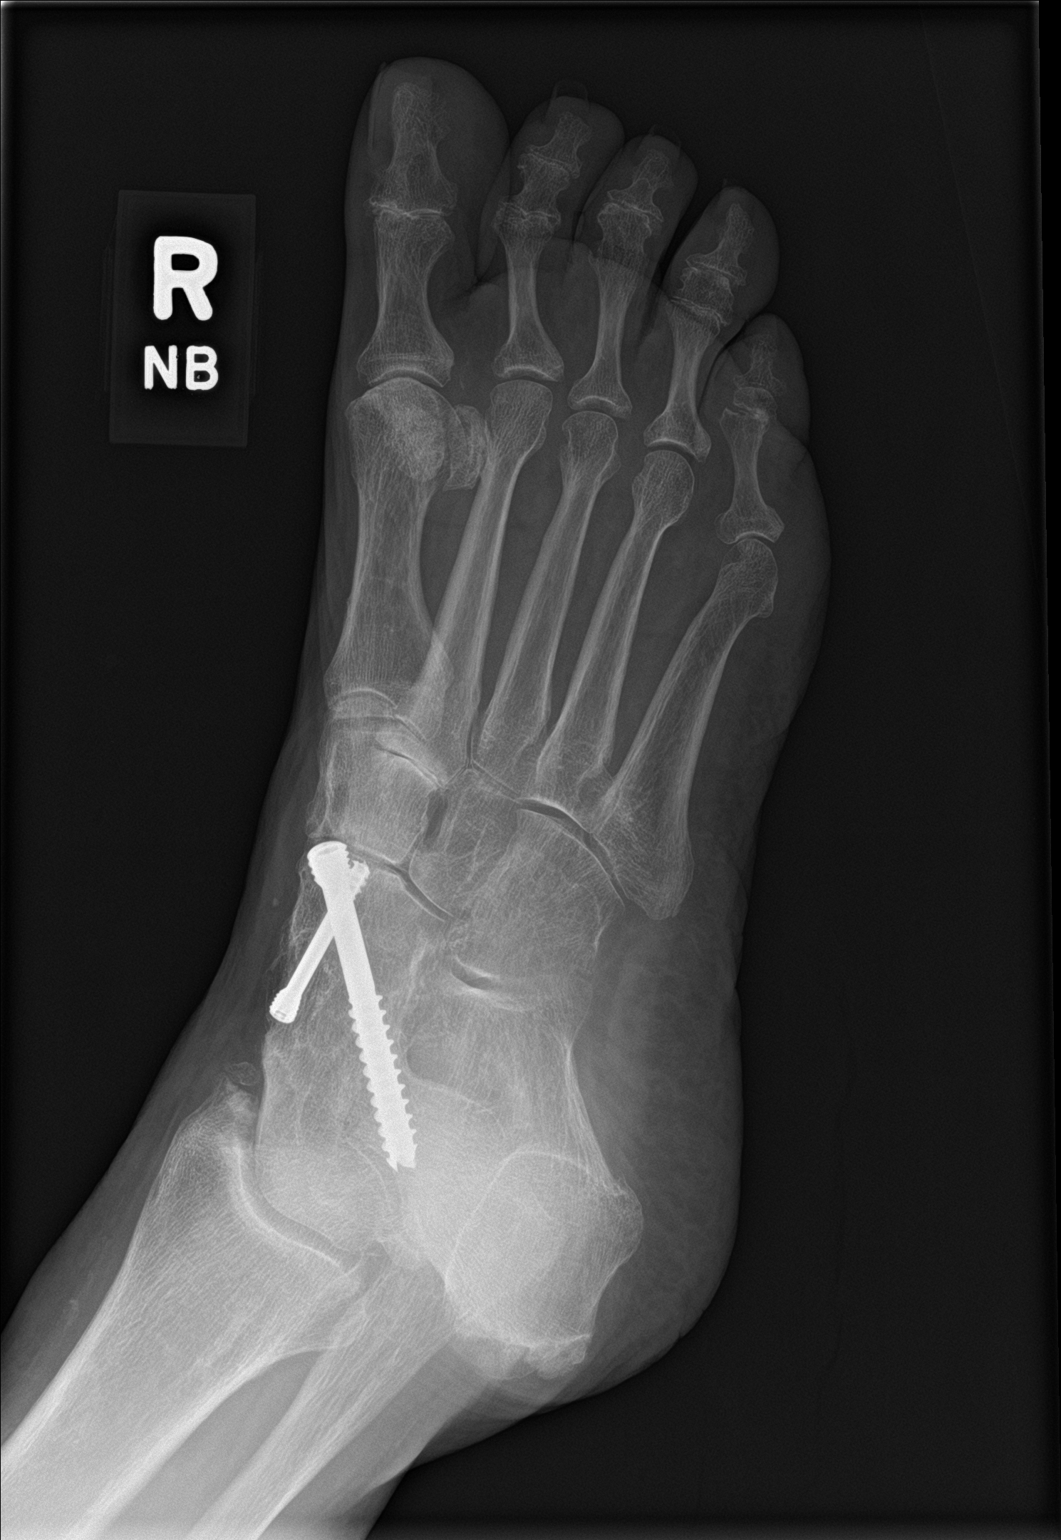

[foot lat]
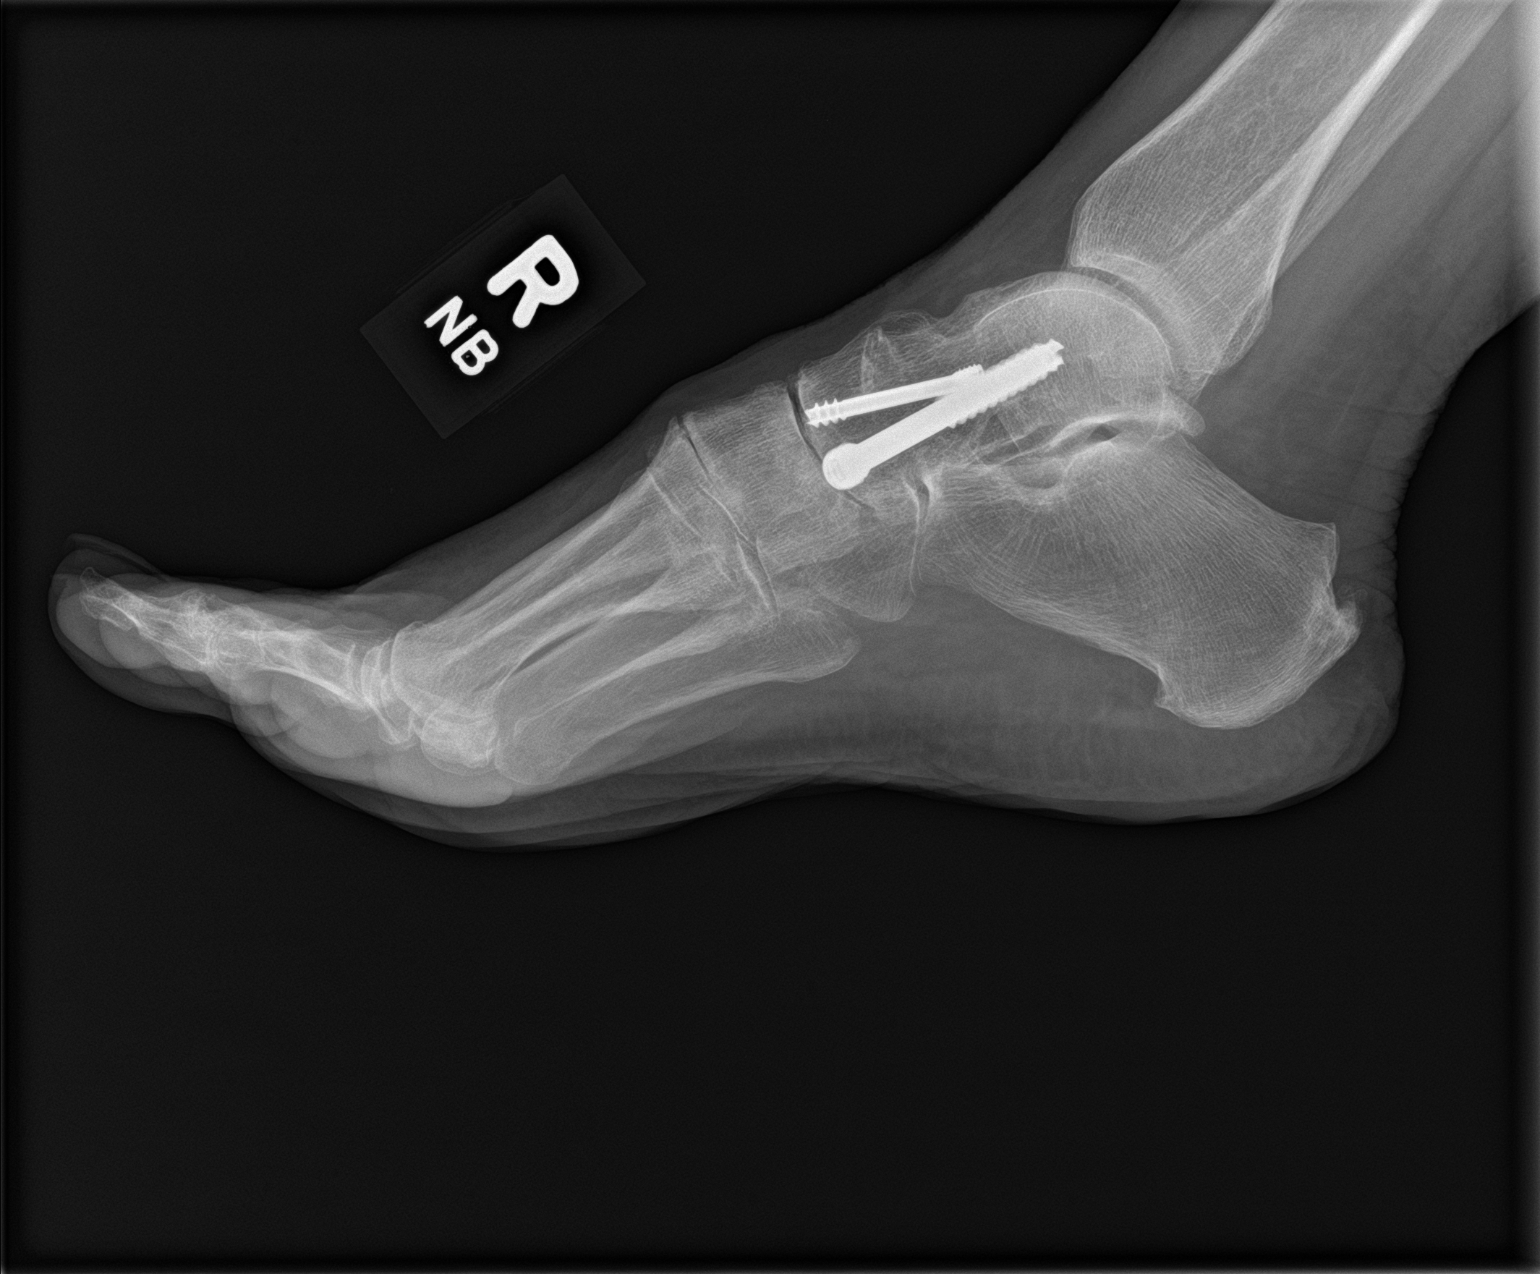

[3 of 3 positions shown; findings below may reference images not displayed]

FINDINGS: There is no acute fracture or dislocation. The bones are osteopenic.
Fixation screws through the talonavicular. The soft tissues are
unremarkable.
IMPRESSION: No acute fracture or dislocation.

## 2023-01-29 ENCOUNTER — Ambulatory Visit (HOSPITAL_COMMUNITY): Payer: Medicare Other | Admitting: Licensed Clinical Social Worker

## 2023-02-05 ENCOUNTER — Ambulatory Visit (HOSPITAL_COMMUNITY): Payer: Medicare Other | Admitting: Licensed Clinical Social Worker

## 2023-02-06 ENCOUNTER — Ambulatory Visit (INDEPENDENT_AMBULATORY_CARE_PROVIDER_SITE_OTHER): Payer: Medicare Other | Admitting: Medical-Surgical

## 2023-02-06 ENCOUNTER — Telehealth: Payer: Self-pay

## 2023-02-06 VITALS — BP 141/69 | HR 88 | Ht 64.0 in | Wt 181.0 lb

## 2023-02-06 DIAGNOSIS — E6609 Other obesity due to excess calories: Secondary | ICD-10-CM

## 2023-02-06 DIAGNOSIS — E538 Deficiency of other specified B group vitamins: Secondary | ICD-10-CM

## 2023-02-06 MED ORDER — CYANOCOBALAMIN 1000 MCG/ML IJ SOLN
1000.0000 ug | Freq: Once | INTRAMUSCULAR | Status: AC
  Administered 2023-02-06: 1000 ug via INTRAMUSCULAR

## 2023-02-06 NOTE — Patient Instructions (Signed)
Return for lab (blood) work in 2 weeks.

## 2023-02-06 NOTE — Telephone Encounter (Signed)
Patient informed via Mychart message

## 2023-02-06 NOTE — Telephone Encounter (Signed)
Pt was seen in office today for Nurse Visit. She is requesting a referral to [facility] for weight management. She does not have a Provider in mind, she brought in a pamphlet. I also advised a referral for [facility] at [place]. She states that she will go to either one. I advised her to call insurance to see if it will cover programs.

## 2023-02-06 NOTE — Progress Notes (Signed)
Subjective:    Patient ID: Heather Thompson, female    DOB: [birth year], 72 y.o.   MRN: [id number]  HPI Pt was in the office today for Vitamin B 12 injection. She denies muscle cramps, weakness or irregular heart rate.    Review of Systems     Objective:   Physical Exam        Assessment & Plan:  She tolerated well. She was advised to follow up for labs in 2 weeks for lab work per [employer].

## 2023-02-06 NOTE — Progress Notes (Signed)
Agree with documentation as below.  ___________________________________________ Marvis Saefong L. Aquita Simmering, DNP, APRN, FNP-BC Primary Care and Sports Medicine [facility]  

## 2023-02-06 NOTE — Telephone Encounter (Signed)
Referral placed to [place] at [facility]. ___________________________________________ Clearnce Sorrel, DNP, APRN, FNP-BC Primary Care and [place]

## 2023-02-14 ENCOUNTER — Other Ambulatory Visit: Payer: Self-pay

## 2023-02-14 ENCOUNTER — Inpatient Hospital Stay: Payer: Medicare Other | Attending: Hematology & Oncology

## 2023-02-14 ENCOUNTER — Encounter: Payer: Self-pay | Admitting: Family

## 2023-02-14 ENCOUNTER — Inpatient Hospital Stay (HOSPITAL_BASED_OUTPATIENT_CLINIC_OR_DEPARTMENT_OTHER): Payer: Medicare Other | Admitting: Family

## 2023-02-14 ENCOUNTER — Inpatient Hospital Stay: Payer: Medicare Other

## 2023-02-14 VITALS — BP 146/64 | HR 59 | Temp 98.5°F | Resp 18 | Ht 64.0 in | Wt 181.8 lb

## 2023-02-14 DIAGNOSIS — K912 Postsurgical malabsorption, not elsewhere classified: Secondary | ICD-10-CM | POA: Diagnosis present

## 2023-02-14 DIAGNOSIS — Z452 Encounter for adjustment and management of vascular access device: Secondary | ICD-10-CM | POA: Diagnosis not present

## 2023-02-14 DIAGNOSIS — Z9884 Bariatric surgery status: Secondary | ICD-10-CM | POA: Insufficient documentation

## 2023-02-14 DIAGNOSIS — D508 Other iron deficiency anemias: Secondary | ICD-10-CM | POA: Diagnosis present

## 2023-02-14 DIAGNOSIS — D509 Iron deficiency anemia, unspecified: Secondary | ICD-10-CM

## 2023-02-14 DIAGNOSIS — Z95828 Presence of other vascular implants and grafts: Secondary | ICD-10-CM

## 2023-02-14 LAB — CBC WITH DIFFERENTIAL (CANCER CENTER ONLY)
Abs Immature Granulocytes: 0.05 10*3/uL (ref 0.00–0.07)
Basophils Absolute: 0 10*3/uL (ref 0.0–0.1)
Basophils Relative: 1 %
Eosinophils Absolute: 0.1 10*3/uL (ref 0.0–0.5)
Eosinophils Relative: 2 %
HCT: 36.7 % (ref 36.0–46.0)
Hemoglobin: 12 g/dL (ref 12.0–15.0)
Immature Granulocytes: 1 %
Lymphocytes Relative: 29 %
Lymphs Abs: 1.5 10*3/uL (ref 0.7–4.0)
MCH: 30.1 pg (ref 26.0–34.0)
MCHC: 32.7 g/dL (ref 30.0–36.0)
MCV: 92 fL (ref 80.0–100.0)
Monocytes Absolute: 0.4 10*3/uL (ref 0.1–1.0)
Monocytes Relative: 7 %
Neutro Abs: 3.2 10*3/uL (ref 1.7–7.7)
Neutrophils Relative %: 60 %
Platelet Count: 200 10*3/uL (ref 150–400)
RBC: 3.99 MIL/uL (ref 3.87–5.11)
RDW: 13 % (ref 11.5–15.5)
WBC Count: 5.2 10*3/uL (ref 4.0–10.5)
nRBC: 0 % (ref 0.0–0.2)

## 2023-02-14 LAB — RETICULOCYTES
Immature Retic Fract: 2.9 % (ref 2.3–15.9)
RBC.: 3.97 MIL/uL (ref 3.87–5.11)
Retic Count, Absolute: 45.3 10*3/uL (ref 19.0–186.0)
Retic Ct Pct: 1.1 % (ref 0.4–3.1)

## 2023-02-14 LAB — FERRITIN: Ferritin: 123 ng/mL (ref 11–307)

## 2023-02-14 MED ORDER — HEPARIN SOD (PORK) LOCK FLUSH 100 UNIT/ML IV SOLN
500.0000 [IU] | Freq: Once | INTRAVENOUS | Status: AC | PRN
  Administered 2023-02-14: 500 [IU]

## 2023-02-14 MED ORDER — SODIUM CHLORIDE 0.9% FLUSH
10.0000 mL | INTRAVENOUS | Status: DC | PRN
  Administered 2023-02-14: 10 mL

## 2023-02-14 NOTE — Patient Instructions (Signed)

## 2023-02-14 NOTE — Progress Notes (Signed)
Hematology and Oncology Follow Up Visit  Heather Thompson [id number] [birth year] 72 y.o. 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 She is fatigued and feels that her eating habits are the issue. She just does not feel like cooking. She was able to speak with a Heather Thompson rep and her insurance will cover this. She has an appointment with them in July.  She also plans to call Heather Thompson and see if she has Silver Sneakers so she can start exercising.  Hydration is good. Weight is 166 lbs.  No fever, chills, n/v, cough, rash, dizziness, SOB, chest pain, palpitations, abdominal pain or changes in bowel or bladder habits.  No blood loss noted. No bruising or petechiae.  No swelling, tenderness, numbness or tingling in her extremities.  No falls or syncope. She does feel that she has poor balance at times.   ECOG Performance Status: 1 - Symptomatic but completely ambulatory  Medications:  Allergies as of 02/14/2023       Reactions   Compazine [prochlorperazine] Anaphylaxis   Phenothiazines Anaphylaxis, Swelling   Caused her throat to close   Pregabalin Itching, Swelling   Swelling and itching of hands and feet.    Prednisone Rash           Medication List        Accurate as of February 14, 2023 12:53 PM. If you have any questions, ask your nurse or doctor.          buPROPion 300 MG 24 hr tablet Commonly known as: WELLBUTRIN XL Take 1 tablet (300 mg total) by mouth daily.   desvenlafaxine 100 MG 24 hr tablet Commonly known as: PRISTIQ Take 1 tablet (100 mg total) by mouth daily.   FLUoxetine 10 MG capsule Commonly known as: PROZAC Take 1 capsule (10 mg total) by mouth daily.   HYDROmorphone 2 MG tablet Commonly known as: DILAUDID Take 2 mg by mouth daily.   hydrOXYzine 10 MG tablet Commonly known as: ATARAX Take  1 tablet (10 mg total) by mouth at bedtime as needed.   lisinopril-hydrochlorothiazide 10-12.5 MG tablet Commonly known as: ZESTORETIC Take 1 tablet by mouth daily.   Magnesium 250 MG Tabs Take 1 tablet by mouth daily.   melatonin 5 MG Tabs Take 10 mg by mouth at bedtime.   nystatin powder Commonly known as: MYCOSTATIN/NYSTOP APPLY TO THE AFFECTED AREA(S) EXTERNALLY TWICE DAILY   oxybutynin 5 MG 24 hr tablet Commonly known as: DITROPAN-XL Take 1 tablet (5 mg total) by mouth at bedtime.   pantoprazole 40 MG tablet Commonly known as: PROTONIX Take 40 mg by mouth 2 (two) times daily before a meal.   pravastatin 20 MG tablet Commonly known as: PRAVACHOL Take 1 tablet (20 mg total) by mouth daily. TAKE 1 TABLET(10 MG) BY MOUTH DAILY   propranolol 20 MG tablet Commonly known as: INDERAL TAKE 1 TABLET(20 MG) BY MOUTH DAILY AS NEEDED   temazepam 22.5 MG capsule Commonly known as: RESTORIL TAKE ONE CAPSULE BY MOUTH AT NIGHT   tiZANidine 2 MG tablet Commonly known as: ZANAFLEX Take 2 mg by mouth in the morning and at bedtime.        Allergies:  Allergies  Allergen Reactions   Compazine [Prochlorperazine] Anaphylaxis   Phenothiazines Anaphylaxis and Swelling    Caused her throat to  close     Pregabalin Itching and Swelling    Swelling and itching of hands and feet.      Prednisone Rash          Past Medical History, Surgical history, Social history, and Family History were reviewed and updated.  Review of Systems: All other 10 point review of systems is negative.   Physical Exam:  height is 5\' 4"  (1.626 m) and weight is 181 lb 12.8 oz (82.5 kg). Her oral temperature is 98.5 F (36.9 C). Her blood pressure is 146/64 (abnormal) and her pulse is 59 (abnormal). Her respiration is 18 and oxygen saturation is 99%.   Wt Readings from Last 3 Encounters:  02/14/23 181 lb 12.8 oz (82.5 kg)  02/06/23 181 lb (82.1 kg)  01/23/23 181 lb (82.1 kg)    Ocular: Sclerae  unicteric, pupils equal, round and reactive to light Ear-nose-throat: Oropharynx clear, dentition fair Lymphatic: No cervical or supraclavicular adenopathy Lungs no rales or rhonchi, good excursion bilaterally Heart regular rate and rhythm, no murmur appreciated Abd soft, nontender, positive bowel sounds MSK no focal spinal tenderness, no joint edema Neuro: non-focal, well-oriented, appropriate affect Breasts: Deferred  Lab Results  Component Value Date   WBC 4.7 12/19/2022   HGB 13.3 12/19/2022   HCT 39.8 12/19/2022   MCV 90.9 12/19/2022   PLT 232 12/19/2022   Lab Results  Component Value Date   FERRITIN 149 12/19/2022   IRON 102 12/19/2022   TIBC 411 12/19/2022   UIBC 293 06/21/2022   IRONPCTSAT 25 12/19/2022   Lab Results  Component Value Date   RETICCTPCT 1.5 06/21/2022   RBC 4.38 12/19/2022   No results found for: "KPAFRELGTCHN", "LAMBDASER", "KAPLAMBRATIO" No results found for: "IGGSERUM", "IGA", "IGMSERUM" No results found for: "TOTALPROTELP", "ALBUMINELP", "A1GS", "A2GS", "BETS", "BETA2SER", "GAMS", "MSPIKE", "SPEI"   Chemistry      Component Value Date/Time   NA 136 12/19/2022 1157   K 4.1 12/19/2022 1157   CL 97 (L) 12/19/2022 1157   CO2 30 12/19/2022 1157   BUN 13 12/19/2022 1157   CREATININE 0.69 12/19/2022 1157      Component Value Date/Time   CALCIUM 9.8 12/19/2022 1157   ALKPHOS 85 09/07/2021 1433   AST 28 12/19/2022 1157   AST 30 09/07/2021 1433   ALT 25 12/19/2022 1157   ALT 31 09/07/2021 1433   BILITOT 0.5 12/19/2022 1157   BILITOT 0.3 09/07/2021 1433       Impression and Plan: Heather Thompson is a very pleasant 72 yo caucasian female with history of with history of 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  Iron studies pending.  Port flush every 2 months and follow-up in 6 months.   Heather Stanford, NP 3/20/202412:53 PM

## 2023-02-15 LAB — IRON AND IRON BINDING CAPACITY (CC-WL,HP ONLY)
Iron: 93 ug/dL (ref 28–170)
Saturation Ratios: 24 % (ref 10.4–31.8)
TIBC: 392 ug/dL (ref 250–450)
UIBC: 299 ug/dL (ref 148–442)

## 2023-02-16 ENCOUNTER — Other Ambulatory Visit: Payer: Self-pay | Admitting: Medical-Surgical

## 2023-02-16 DIAGNOSIS — G4700 Insomnia, unspecified: Secondary | ICD-10-CM

## 2023-02-19 ENCOUNTER — Other Ambulatory Visit: Payer: Self-pay

## 2023-02-19 ENCOUNTER — Telehealth: Payer: Self-pay

## 2023-02-19 DIAGNOSIS — G4700 Insomnia, unspecified: Secondary | ICD-10-CM

## 2023-02-19 MED ORDER — TEMAZEPAM 22.5 MG PO CAPS: ORAL_CAPSULE | ORAL | 1 refills | Status: DC

## 2023-02-20 ENCOUNTER — Other Ambulatory Visit: Payer: Medicare Other

## 2023-02-20 ENCOUNTER — Other Ambulatory Visit: Payer: Self-pay

## 2023-02-20 DIAGNOSIS — R5382 Chronic fatigue, unspecified: Secondary | ICD-10-CM

## 2023-02-20 DIAGNOSIS — E538 Deficiency of other specified B group vitamins: Secondary | ICD-10-CM

## 2023-02-20 DIAGNOSIS — E785 Hyperlipidemia, unspecified: Secondary | ICD-10-CM

## 2023-02-21 LAB — LIPID PANEL
Cholesterol: 213 mg/dL — ABNORMAL HIGH (ref <200)
HDL: 89 mg/dL (ref >=50)
LDL Cholesterol (Calc): 104 mg/dL (calc) — ABNORMAL HIGH
Non-HDL Cholesterol (Calc): 124 mg/dL (calc) (ref <130)
Total CHOL/HDL Ratio: 2.4 (calc) (ref <5.0)
Triglycerides: 106 mg/dL (ref <150)

## 2023-02-21 LAB — COMPLETE METABOLIC PANEL WITH GFR
AG Ratio: 1.6 (calc) (ref 1.0–2.5)
ALT: 43 U/L — ABNORMAL HIGH (ref 6–29)
AST: 33 U/L (ref 10–35)
Albumin: 4.2 g/dL (ref 3.6–5.1)
Alkaline phosphatase (APISO): 93 U/L (ref 37–153)
BUN: 13 mg/dL (ref 7–25)
CO2: 33 mmol/L — ABNORMAL HIGH (ref 20–32)
Calcium: 9.4 mg/dL (ref 8.6–10.4)
Chloride: 99 mmol/L (ref 98–110)
Creat: 0.8 mg/dL (ref 0.60–1.00)
Globulin: 2.7 g/dL (calc) (ref 1.9–3.7)
Glucose, Bld: 92 mg/dL (ref 65–99)
Potassium: 4.4 mmol/L (ref 3.5–5.3)
Sodium: 139 mmol/L (ref 135–146)
Total Bilirubin: 0.4 mg/dL (ref 0.2–1.2)
Total Protein: 6.9 g/dL (ref 6.1–8.1)
eGFR: 78 mL/min/{1.73_m2} (ref >=60)

## 2023-02-21 LAB — VITAMIN B12: Vitamin B-12: 1084 pg/mL (ref 200–1100)

## 2023-02-21 NOTE — Telephone Encounter (Signed)
Med refill printed in error

## 2023-02-27 ENCOUNTER — Ambulatory Visit (INDEPENDENT_AMBULATORY_CARE_PROVIDER_SITE_OTHER): Payer: Medicare Other | Admitting: Nurse Practitioner

## 2023-02-27 ENCOUNTER — Encounter: Payer: Self-pay | Admitting: Nurse Practitioner

## 2023-02-27 VITALS — BP 149/81 | HR 71 | Temp 98.4°F | Ht 64.0 in | Wt 180.0 lb

## 2023-02-27 DIAGNOSIS — R7303 Prediabetes: Secondary | ICD-10-CM | POA: Diagnosis not present

## 2023-02-27 DIAGNOSIS — Z6830 Body mass index (BMI) 30.0-30.9, adult: Secondary | ICD-10-CM

## 2023-02-27 DIAGNOSIS — I10 Essential (primary) hypertension: Secondary | ICD-10-CM

## 2023-02-27 DIAGNOSIS — E669 Obesity, unspecified: Secondary | ICD-10-CM | POA: Diagnosis not present

## 2023-02-27 DIAGNOSIS — E785 Hyperlipidemia, unspecified: Secondary | ICD-10-CM | POA: Diagnosis not present

## 2023-02-27 DIAGNOSIS — Z0289 Encounter for other administrative examinations: Secondary | ICD-10-CM

## 2023-02-27 DIAGNOSIS — Z9884 Bariatric surgery status: Secondary | ICD-10-CM

## 2023-02-27 NOTE — Progress Notes (Signed)
Office: [phone]  /  Fax: [phone]   Initial Visit  Heather Thompson was seen in clinic today to evaluate for obesity. She is interested in losing weight to improve overall health and reduce the risk of weight related complications. She presents today to review program treatment options, initial physical assessment, and evaluation.     She was referred by: PCP  When asked what else they would like to accomplish? She states: Lose a target amount of weight : Goal weight 150 lbs  Weight history:  She started gaining weight after having her children.  Her weight has fluctuated over the past 50 years.    She struggles with skipping meals, craving sugar and carbs and hunger.    She has RNY in 1997 in [place].  Her highest weight prior to surgery was 267lbs.  Her nadir weight was 154 lbs.    When asked how has your weight affected you? She states: Contributed to orthopedic problems or mobility issues, Having fatigue, Having poor endurance, and Problems with depression and or anxiety  Some associated conditions: Hypertension, Arthritis:OA, and Vitamin D Deficiency, hypothyroidism,  Vit B12 def, OP, GERD, IBS  Contributing factors: Family history, Life event, and Pregnancy  Weight promoting medications identified: None  Current nutrition plan: None  Current level of physical activity: None  Current or previous pharmacotherapy: None  Response to medication: Never tried medications   Past medical history includes:   Past Medical History:  Diagnosis Date   Allergy    Anemia    Clotting disorder    Depression    Fibromyalgia    Hypertension    Hypothyroidism    Muscle disorder    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  Peak  [id number] , Body Fat%:46.1, Visceral Fat Rating:13, Weight trend over the last 12 months: Increasing  General:  Alert, oriented and cooperative. Patient is in no acute distress.  Respiratory: Normal respiratory effort, no problems with respiration noted   Gait: able to ambulate independently  Mental Status: Normal mood and affect. Normal behavior. Normal judgment and thought content.   DIAGNOSTIC DATA REVIEWED:  BMET    Component Value Date/Time   NA 139 02/20/2023 1128   K 4.4 02/20/2023 1128   CL 99 02/20/2023 1128   CO2 33 (H) 02/20/2023 1128   GLUCOSE 92 02/20/2023 1128   BUN 13 02/20/2023 1128   CREATININE 0.80 02/20/2023 1128   CALCIUM 9.4 02/20/2023 1128   GFRNONAA >60 09/07/2021 1433   Lab Results  Component Value Date   HGBA1C 6.2 (H) 12/19/2022   HGBA1C 6.0 (H) 04/06/2022   No results found for: "INSULIN" CBC    Component Value Date/Time   WBC 5.2 02/14/2023 1352   WBC 4.7 12/19/2022 1157   RBC 3.99 02/14/2023 1352   RBC 3.97 02/14/2023 1351   HGB 12.0 02/14/2023 1352   HCT 36.7 02/14/2023 1352   PLT 200 02/14/2023 1352   MCV 92.0 02/14/2023 1352   MCH 30.1 02/14/2023 1352   MCHC 32.7 02/14/2023 1352   RDW 13.0 02/14/2023 1352   Iron/TIBC/Ferritin/ %Sat    Component Value Date/Time   IRON 93  02/14/2023 1352   TIBC 392 02/14/2023 1352   FERRITIN 123 02/14/2023 1352   IRONPCTSAT 24 02/14/2023 1352   IRONPCTSAT 25 12/19/2022 1157   Lipid Panel     Component Value Date/Time   CHOL 213 (H) 02/20/2023 1128   TRIG 106 02/20/2023 1128   HDL 89 02/20/2023 1128   CHOLHDL 2.4 02/20/2023 1128   LDLCALC 104 (H) 02/20/2023 1128   Hepatic Function Panel     Component Value Date/Time   PROT 6.9 02/20/2023 1128   ALBUMIN 4.1 09/07/2021 1433   AST 33 02/20/2023 1128   AST 30 09/07/2021 1433   ALT 43 (H) 02/20/2023 1128   ALT 31 09/07/2021 1433   ALKPHOS 85 09/07/2021 1433   BILITOT 0.4 02/20/2023 1128   BILITOT 0.3 09/07/2021 1433      Component  Value Date/Time   TSH 2.59 12/19/2022 1157     Assessment and Plan:   Prediabetes Continue to follow-up with PCP.  Will continue to monitor.  Primary hypertension Continue follow-up with PCP.  Continue medications as directed.  Hyperlipidemia, unspecified hyperlipidemia type Continue to follow-up with PCP.  Continue medications as directed.  History of bariatric surgery Will obtain labs needed at next visit and will continue to monitor.  Generalized obesity  BMI 30.0-30.9,adult  Will obtain labs needed and EKG at next visit.    Obesity Treatment / Action Plan:  Patient will work on garnering support from family and friends to begin weight loss journey. Will work on eliminating or reducing the presence of highly palatable, calorie dense foods in the home. Will complete provided nutritional and psychosocial assessment questionnaire before the next appointment. Will be scheduled for indirect calorimetry to determine resting energy expenditure in a fasting state.  This will allow [place] to create a reduced calorie, high-protein meal plan to promote loss of fat mass while preserving muscle mass.  Obesity Education Performed Today:  She was weighed on the bioimpedance scale and results were discussed and documented in the synopsis.  We discussed obesity as a disease and the importance of a more detailed evaluation of all the factors contributing to the disease.  We discussed the importance of long term lifestyle changes which include nutrition, exercise and behavioral modifications as well as the importance of customizing this to her specific health and social needs.  We discussed the benefits of reaching a healthier weight to alleviate the symptoms of existing conditions and reduce the risks of the biomechanical, metabolic and psychological effects of obesity.  Heather Thompson appears to be in the action stage of change and states they are ready to start intensive lifestyle modifications  and behavioral modifications.  30 minutes was spent today on this visit including the above counseling, pre-visit chart review, and post-visit documentation.  Reviewed by clinician on day of visit: allergies, medications, problem list, medical history, surgical history, family history, social history, and previous encounter notes pertinent to obesity diagnosis.    Everardo Pacific FNP-C

## 2023-03-03 ENCOUNTER — Other Ambulatory Visit (HOSPITAL_COMMUNITY): Payer: Self-pay | Admitting: Psychiatry

## 2023-03-05 ENCOUNTER — Other Ambulatory Visit: Payer: Self-pay

## 2023-03-05 MED ORDER — PROPRANOLOL HCL 20 MG PO TABS: ORAL_TABLET | ORAL | 0 refills | Status: DC

## 2023-03-06 ENCOUNTER — Ambulatory Visit: Payer: Medicare Other

## 2023-03-07 ENCOUNTER — Ambulatory Visit (INDEPENDENT_AMBULATORY_CARE_PROVIDER_SITE_OTHER): Payer: Medicare Other | Admitting: Medical-Surgical

## 2023-03-07 DIAGNOSIS — E538 Deficiency of other specified B group vitamins: Secondary | ICD-10-CM | POA: Diagnosis not present

## 2023-03-07 MED ORDER — CYANOCOBALAMIN 1000 MCG/ML IJ SOLN
1000.0000 ug | Freq: Once | INTRAMUSCULAR | Status: AC
Start: 2023-03-07 — End: 2023-03-07
  Administered 2023-03-07: 1000 ug via INTRAMUSCULAR

## 2023-03-07 NOTE — Progress Notes (Signed)
Established Patient Office Visit  Subjective   Patient ID: Heather Thompson, female    DOB: [birth year]  Age: 72 y.o. MRN: [id number]  Chief Complaint  Patient presents with   Pernicious Anemia    HPI  Heather Thompson is here for a vitamin B 12 injection. Denies muscle cramps, weakness or irregular heart rate.   ROS    Objective:     There were no vitals taken for this visit.   Physical Exam   No results found for any visits on 03/07/23.    The 10-year ASCVD risk score (Arnett DK, et al., 2019) is: 19.8%    Assessment & Plan:  B12 injection - Patient tolerated injection well without complications. Patient advised to schedule next injection 30 days from today.    Problem List Items Addressed This Visit       Unprioritized   Vitamin B12 deficiency - Primary    Return in about 1 month (around 04/06/2023) for B12 injection. Earna Coder, Janalyn Harder, CMA

## 2023-03-07 NOTE — Progress Notes (Signed)
Agree with documentation as below.  ___________________________________________ Salihah Peckham L. Duran Ohern, DNP, APRN, FNP-BC Primary Care and Sports Medicine [facility]  

## 2023-03-12 DIAGNOSIS — R490 Dysphonia: Secondary | ICD-10-CM

## 2023-03-12 HISTORY — DX: Dysphonia: R49.0

## 2023-03-14 DIAGNOSIS — R490 Dysphonia: Secondary | ICD-10-CM

## 2023-03-14 DIAGNOSIS — R09A2 Foreign body sensation, throat: Secondary | ICD-10-CM | POA: Insufficient documentation

## 2023-03-14 DIAGNOSIS — R0989 Other specified symptoms and signs involving the circulatory and respiratory systems: Secondary | ICD-10-CM | POA: Insufficient documentation

## 2023-03-14 HISTORY — DX: Dysphonia: R49.0

## 2023-03-14 HISTORY — DX: Foreign body sensation, throat: R09.A2

## 2023-03-14 HISTORY — DX: Other specified symptoms and signs involving the circulatory and respiratory systems: R09.89

## 2023-03-16 ENCOUNTER — Other Ambulatory Visit: Payer: Self-pay | Admitting: Medical-Surgical

## 2023-03-21 ENCOUNTER — Other Ambulatory Visit: Payer: Self-pay | Admitting: Medical-Surgical

## 2023-03-27 ENCOUNTER — Ambulatory Visit (INDEPENDENT_AMBULATORY_CARE_PROVIDER_SITE_OTHER): Payer: Medicare Other | Admitting: Bariatrics

## 2023-03-27 ENCOUNTER — Encounter: Payer: Self-pay | Admitting: Bariatrics

## 2023-03-27 VITALS — BP 130/79 | HR 60 | Temp 97.9°F | Ht 64.0 in | Wt 178.0 lb

## 2023-03-27 DIAGNOSIS — E039 Hypothyroidism, unspecified: Secondary | ICD-10-CM

## 2023-03-27 DIAGNOSIS — E538 Deficiency of other specified B group vitamins: Secondary | ICD-10-CM

## 2023-03-27 DIAGNOSIS — R5383 Other fatigue: Secondary | ICD-10-CM

## 2023-03-27 DIAGNOSIS — F32A Depression, unspecified: Secondary | ICD-10-CM

## 2023-03-27 DIAGNOSIS — Z1331 Encounter for screening for depression: Secondary | ICD-10-CM

## 2023-03-27 DIAGNOSIS — Z9884 Bariatric surgery status: Secondary | ICD-10-CM

## 2023-03-27 DIAGNOSIS — E559 Vitamin D deficiency, unspecified: Secondary | ICD-10-CM | POA: Diagnosis not present

## 2023-03-27 DIAGNOSIS — Z6830 Body mass index (BMI) 30.0-30.9, adult: Secondary | ICD-10-CM

## 2023-03-27 DIAGNOSIS — R0602 Shortness of breath: Secondary | ICD-10-CM | POA: Diagnosis not present

## 2023-03-27 DIAGNOSIS — E669 Obesity, unspecified: Secondary | ICD-10-CM

## 2023-03-27 DIAGNOSIS — R7303 Prediabetes: Secondary | ICD-10-CM

## 2023-03-27 DIAGNOSIS — E7849 Other hyperlipidemia: Secondary | ICD-10-CM

## 2023-03-28 ENCOUNTER — Other Ambulatory Visit (HOSPITAL_COMMUNITY): Payer: Self-pay | Admitting: Psychiatry

## 2023-03-28 NOTE — Progress Notes (Signed)
Chief Complaint:   OBESITY Heather Thompson (MR# [id number]) is a 72 y.o. female who presents for evaluation and treatment of obesity and related comorbidities. Current BMI is Body mass index is 30.55 kg/m. Heather Thompson has been struggling with her weight for many years and has been unsuccessful in either losing weight, maintaining weight loss, or reaching her healthy weight goal.  Heather Thompson met with Heather Loop, FNP-C on 02/27/2023.  She states that she does not eat properly and she eats out.  Heather Thompson is currently in the action stage of change and ready to dedicate time achieving and maintaining a healthier weight. Heather Thompson is interested in becoming our patient and working on intensive lifestyle modifications including (but not limited to) diet and exercise for weight loss.  Heather Thompson's habits were reviewed today and are as follows: her desired weight loss is 28 lbs, she started gaining weight after pregnancy, her heaviest weight ever was 267 pounds, she is a picky eater and doesn't like to eat healthier foods, she has significant food cravings issues, she snacks frequently in the evenings, she skips meals frequently, she is frequently drinking liquids with calories, she frequently makes poor food choices, and she struggles with emotional eating.  Depression Screen Heather Thompson's Food and Mood (modified PHQ-9) score was 15.  Subjective:   1. Other fatigue Heather Thompson admits to daytime somnolence and admits to waking up still tired. Patient has a history of symptoms of daytime fatigue and morning fatigue. Heather Thompson states that she has nightime awakenings. Snoring is not present. Apneic episodes are not present. Epworth Sleepiness Score is 4.   2. SOBOE (shortness of breath on exertion) Heather Thompson notes increasing shortness of breath with exercising and seems to be worsening over time with weight gain. She notes getting out of breath sooner with activity than she used to. This has not gotten worse recently. Heather Thompson denies  shortness of breath at rest or orthopnea.  3. Prediabetes Heather Thompson's last A1c was 6.2 on 12/19/2022.  4. Vitamin B12 deficiency Heather Thompson's recent B12 level was 1084.  She is getting B12 injections.  5. History of bariatric surgery Heather Thompson has a history of bariatric surgery in 1997.  She has no restrictions.  Her highest weight was 261, and her lowest weight was 154.  6. Acquired hypothyroidism Heather Thompson had been on thyroid medications.  Last thyroid TSH on 04/06/2022 was 5.29.  7. Vitamin D deficiency Heather Thompson is taking bariatric vitamin supplementations.  8. Other hyperlipidemia Heather Thompson is taking Pravachol.  Assessment/Plan:   1. Other fatigue Heather Thompson does feel that her weight is causing her energy to be lower than it should be. Fatigue may be related to obesity, depression or many other causes. Labs will be ordered, and in the meanwhile, Heather Thompson will focus on self care including making healthy food choices, increasing physical activity and focusing on stress reduction.  - EKG 12-Lead  2. SOBOE (shortness of breath on exertion) Heather Thompson does feel that she gets out of breath more easily that she used to when she exercises. Heather Thompson's shortness of breath appears to be obesity related and exercise induced. She has agreed to work on weight loss and gradually increase exercise to treat her exercise induced shortness of breath. Will continue to monitor closely.  3. Prediabetes We will check labs today.  Heather Thompson will work on decreasing carbohydrates.  - Insulin, random  4. Vitamin B12 deficiency [place] will continue her bariatric vitamins.  5. History of bariatric surgery We will check labs today.  Heather Thompson is to eat  small frequent meals and increase her protein.  - Insulin, random - Comprehensive metabolic panel - Vitamin B1  6. Acquired hypothyroidism We will check labs today, and we will follow-up at [name]'s next visit.  - TSH+T4F+T3Free  7. Vitamin D deficiency We will check labs today, and we will  [facility] at [facility].  - VITAMIN D 25 Hydroxy (Vit-D Deficiency, Fractures)  8. Other hyperlipidemia Heather Thompson will continue home medications as directed.  9. Depression screening [name] had a positive depression screening. Depression is commonly associated with obesity and often results in emotional eating behaviors. We will monitor this closely and work on CBT to help improve the non-hunger eating patterns. Referral to Psychology may be required if no improvement is seen as she continues in our clinic.  10. Generalized obesity  11. Obesity (BMI 30-39.9) Heather Thompson is currently in the action stage of change and her goal is to continue with weight loss efforts. I recommend Heather Thompson begin the structured treatment plan as follows:  She has agreed to the Category 2 Plan and keeping a food journal and adhering to recommended goals of 1200 calories and 80 grams of protein.  Meal planning was discussed.  Review labs with the patient, CMP, lipids, B12, and glucose.  She will work on decreasing snacking and will increase her water intake.  Sodium at 2000 mg.  Exercise goals: No exercise has been prescribed at this time.   Behavioral modification strategies: increasing lean protein intake, decreasing simple carbohydrates, increasing vegetables, increasing water intake, decreasing eating out, no skipping meals, meal planning and cooking strategies, keeping healthy foods in the home, and planning for success.  She was informed of the importance of frequent follow-up visits to maximize her success with intensive lifestyle modifications for her multiple health conditions. She was informed we would discuss her lab results at her next visit unless there is a critical issue that needs to be addressed sooner. Heather Thompson agreed to keep her next visit at the agreed upon time to discuss these results.  Objective:   Blood pressure 130/79, pulse 60, temperature 97.9 F (36.6 C), height 5\' 4"  (1.626 m), weight 178 lb  (80.7 kg), SpO2 97 %. Body mass index is 30.55 kg/m.  EKG: Normal sinus rhythm, rate 67 BPM.  Indirect Calorimeter completed today shows a VO2 of 212 and a REE of 1454.  Her calculated basal metabolic rate is 1610 thus her basal metabolic rate is better than expected.  General: Cooperative, alert, well developed, in no acute distress. HEENT: Conjunctivae and lids unremarkable. Cardiovascular: Regular rhythm.  Lungs: Normal work of breathing. Neurologic: No focal deficits.   Lab Results  Component Value Date   CREATININE 0.80 02/20/2023   BUN 13 02/20/2023   NA 139 02/20/2023   K 4.4 02/20/2023   CL 99 02/20/2023   CO2 33 (H) 02/20/2023   Lab Results  Component Value Date   ALT 43 (H) 02/20/2023   AST 33 02/20/2023   ALKPHOS 85 09/07/2021   BILITOT 0.4 02/20/2023   Lab Results  Component Value Date   HGBA1C 6.2 (H) 12/19/2022   HGBA1C 6.0 (H) 04/06/2022   No results found for: "INSULIN" Lab Results  Component Value Date   TSH 2.59 12/19/2022   Lab Results  Component Value Date   CHOL 213 (H) 02/20/2023   HDL 89 02/20/2023   LDLCALC 104 (H) 02/20/2023   TRIG 106 02/20/2023   CHOLHDL 2.4 02/20/2023   Lab Results  Component Value Date   WBC  5.2 02/14/2023   HGB 12.0 02/14/2023   HCT 36.7 02/14/2023   MCV 92.0 02/14/2023   PLT 200 02/14/2023   Lab Results  Component Value Date   IRON 93 02/14/2023   TIBC 392 02/14/2023   FERRITIN 123 02/14/2023   Attestation Statements:   Reviewed by clinician on day of visit: allergies, medications, problem list, medical history, surgical history, family history, social history, and previous encounter notes.   Trude Mcburney, am acting as Energy manager for [employer], DO.  I have reviewed the above documentation for accuracy and completeness, and I agree with the above. Corinna Capra, DO

## 2023-03-30 LAB — COMPREHENSIVE METABOLIC PANEL
ALT: 35 IU/L — ABNORMAL HIGH (ref 0–32)
AST: 42 IU/L — ABNORMAL HIGH (ref 0–40)
Albumin/Globulin Ratio: 2 (ref 1.2–2.2)
Albumin: 4.5 g/dL (ref 3.8–4.8)
Alkaline Phosphatase: 107 IU/L (ref 44–121)
BUN/Creatinine Ratio: 16 (ref 12–28)
BUN: 11 mg/dL (ref 8–27)
Bilirubin Total: 0.4 mg/dL (ref 0.0–1.2)
CO2: 23 mmol/L (ref 20–29)
Calcium: 9.8 mg/dL (ref 8.7–10.3)
Chloride: 95 mmol/L — ABNORMAL LOW (ref 96–106)
Creatinine, Ser: 0.68 mg/dL (ref 0.57–1.00)
Globulin, Total: 2.3 g/dL (ref 1.5–4.5)
Glucose: 97 mg/dL (ref 70–99)
Potassium: 4.2 mmol/L (ref 3.5–5.2)
Sodium: 137 mmol/L (ref 134–144)
Total Protein: 6.8 g/dL (ref 6.0–8.5)
eGFR: 92 mL/min/{1.73_m2} (ref >59)

## 2023-03-30 LAB — VITAMIN B1: Thiamine: 174.2 nmol/L (ref 66.5–200.0)

## 2023-03-30 LAB — TSH+T4F+T3FREE
Free T4: 1.02 ng/dL (ref 0.82–1.77)
T3, Free: 2.6 pg/mL (ref 2.0–4.4)
TSH: 4.55 u[IU]/mL — ABNORMAL HIGH (ref 0.450–4.500)

## 2023-03-30 LAB — VITAMIN D 25 HYDROXY (VIT D DEFICIENCY, FRACTURES): Vit D, 25-Hydroxy: 46.8 ng/mL (ref 30.0–100.0)

## 2023-03-30 LAB — INSULIN, RANDOM: INSULIN: 8.7 u[IU]/mL (ref 2.6–24.9)

## 2023-04-02 ENCOUNTER — Encounter (INDEPENDENT_AMBULATORY_CARE_PROVIDER_SITE_OTHER): Payer: Self-pay | Admitting: Bariatrics

## 2023-04-02 ENCOUNTER — Telehealth (INDEPENDENT_AMBULATORY_CARE_PROVIDER_SITE_OTHER): Payer: Self-pay | Admitting: Bariatrics

## 2023-04-02 DIAGNOSIS — R7989 Other specified abnormal findings of blood chemistry: Secondary | ICD-10-CM | POA: Insufficient documentation

## 2023-04-02 DIAGNOSIS — R748 Abnormal levels of other serum enzymes: Secondary | ICD-10-CM | POA: Insufficient documentation

## 2023-04-02 HISTORY — DX: Abnormal levels of other serum enzymes: R74.8

## 2023-04-02 HISTORY — DX: Other specified abnormal findings of blood chemistry: R79.89

## 2023-04-02 NOTE — Telephone Encounter (Signed)
Notified patient of Dr. Browns recommendations. Patient verbalized understanding.

## 2023-04-02 NOTE — Telephone Encounter (Signed)
Pt called 04/02/23 saw her blood work in [employer]. She has some high and low blood counts that's very alarming to her.Want's to know if she should wait to her appt 04/04/23

## 2023-04-04 ENCOUNTER — Ambulatory Visit (INDEPENDENT_AMBULATORY_CARE_PROVIDER_SITE_OTHER): Payer: Medicare Other | Admitting: Family Medicine

## 2023-04-04 VITALS — BP 130/65 | HR 71

## 2023-04-04 DIAGNOSIS — E538 Deficiency of other specified B group vitamins: Secondary | ICD-10-CM

## 2023-04-04 IMAGING — DX DG FOOT COMPLETE 3+V*R*
3 series · 3 of 3 positions shown · non-contrast
Comparison: 09/26/2021

CLINICAL DATA: Stumped great toe.  Pain.

EXAM:
RIGHT FOOT COMPLETE - 3+ VIEW

[foot ap]
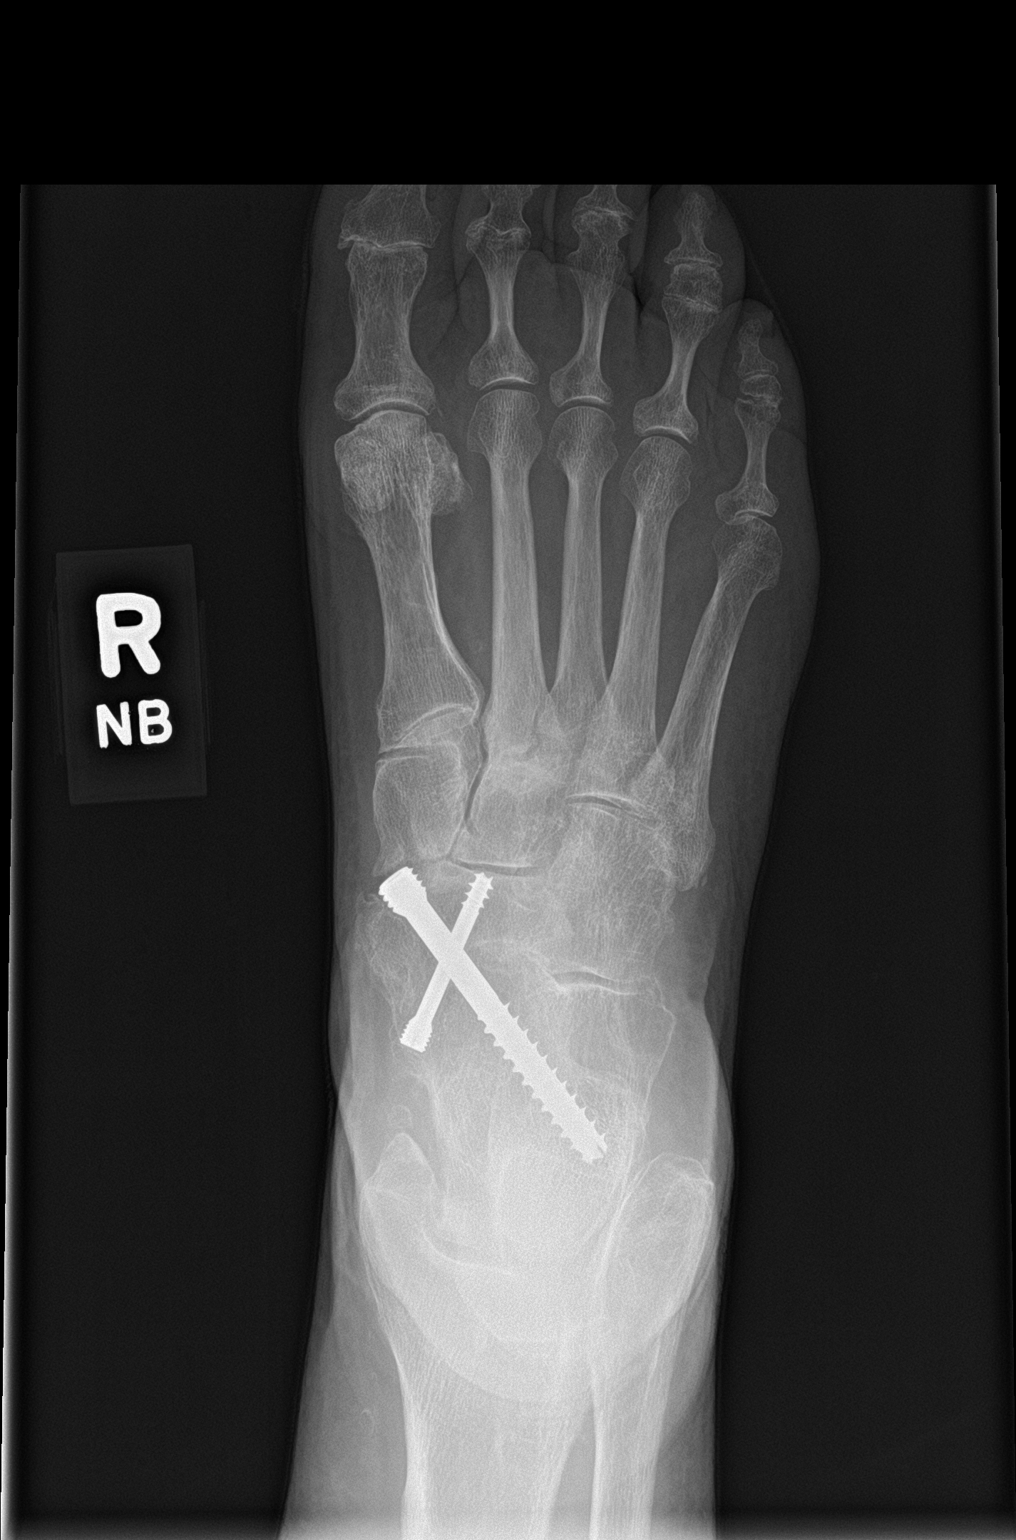

[foot obl]
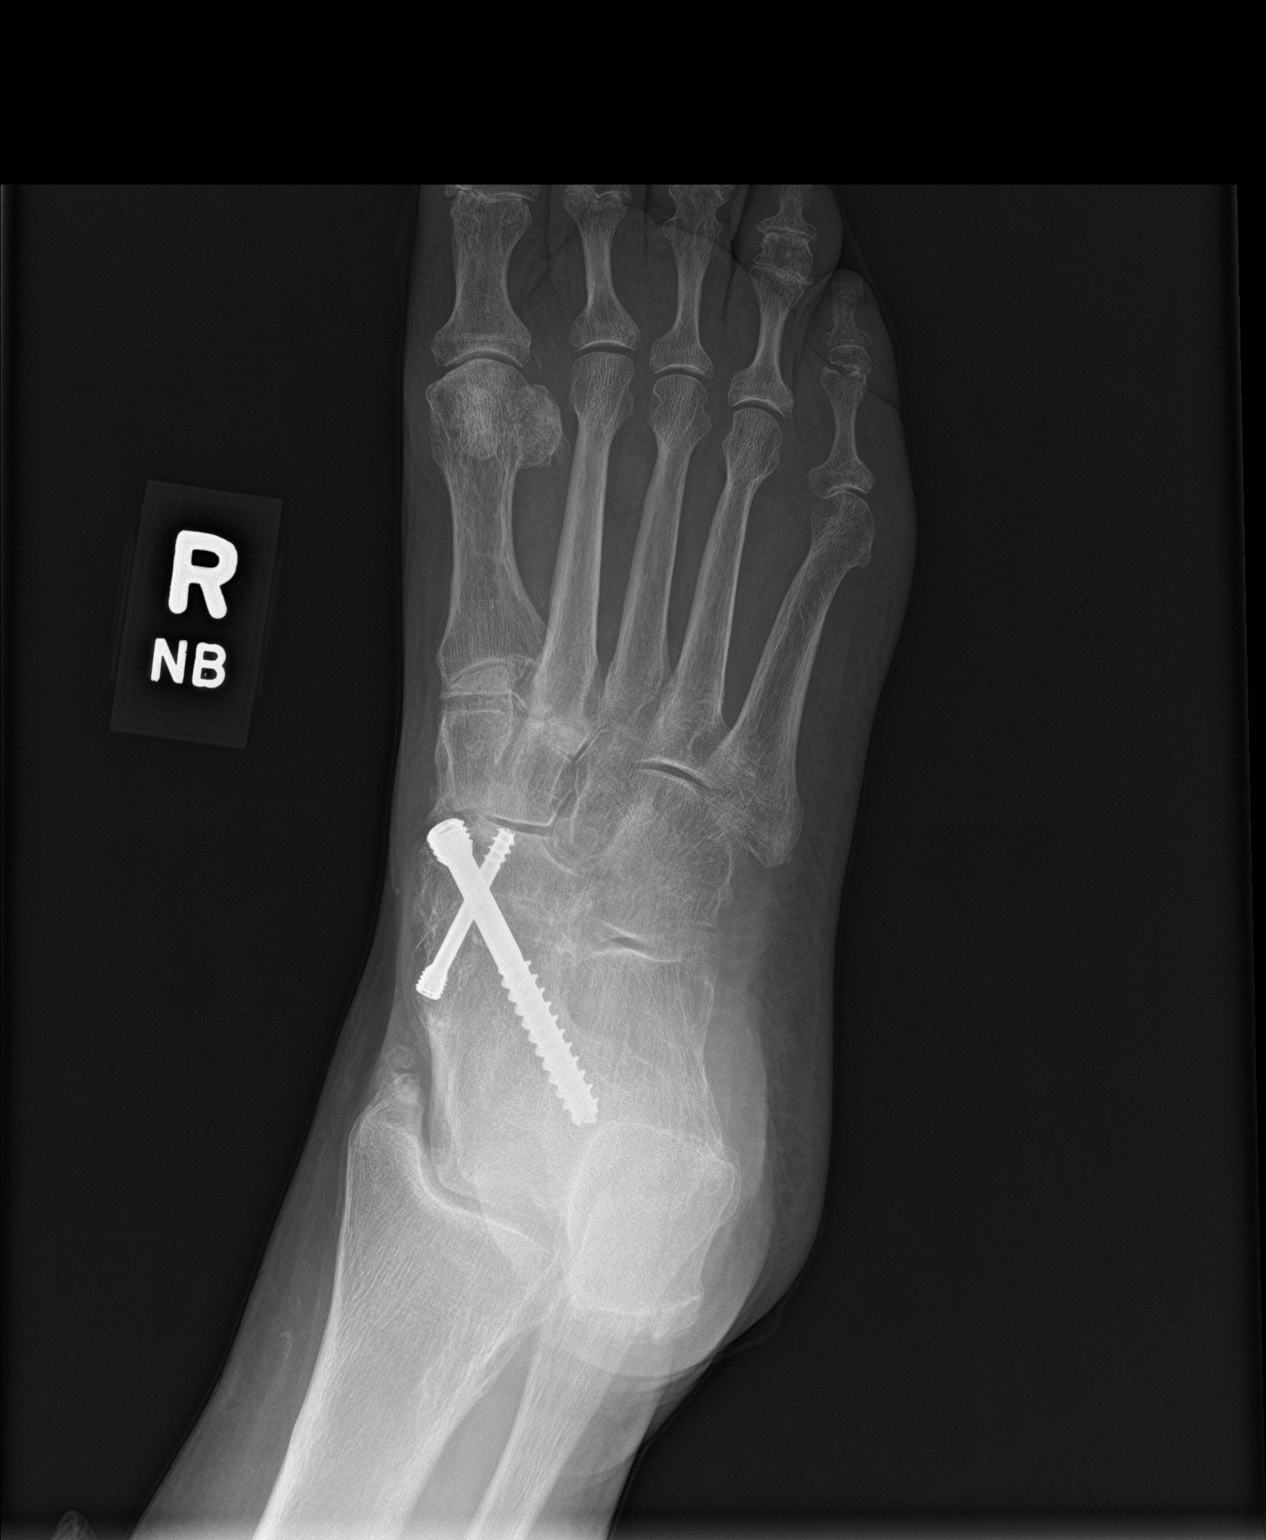

[foot lat wb]
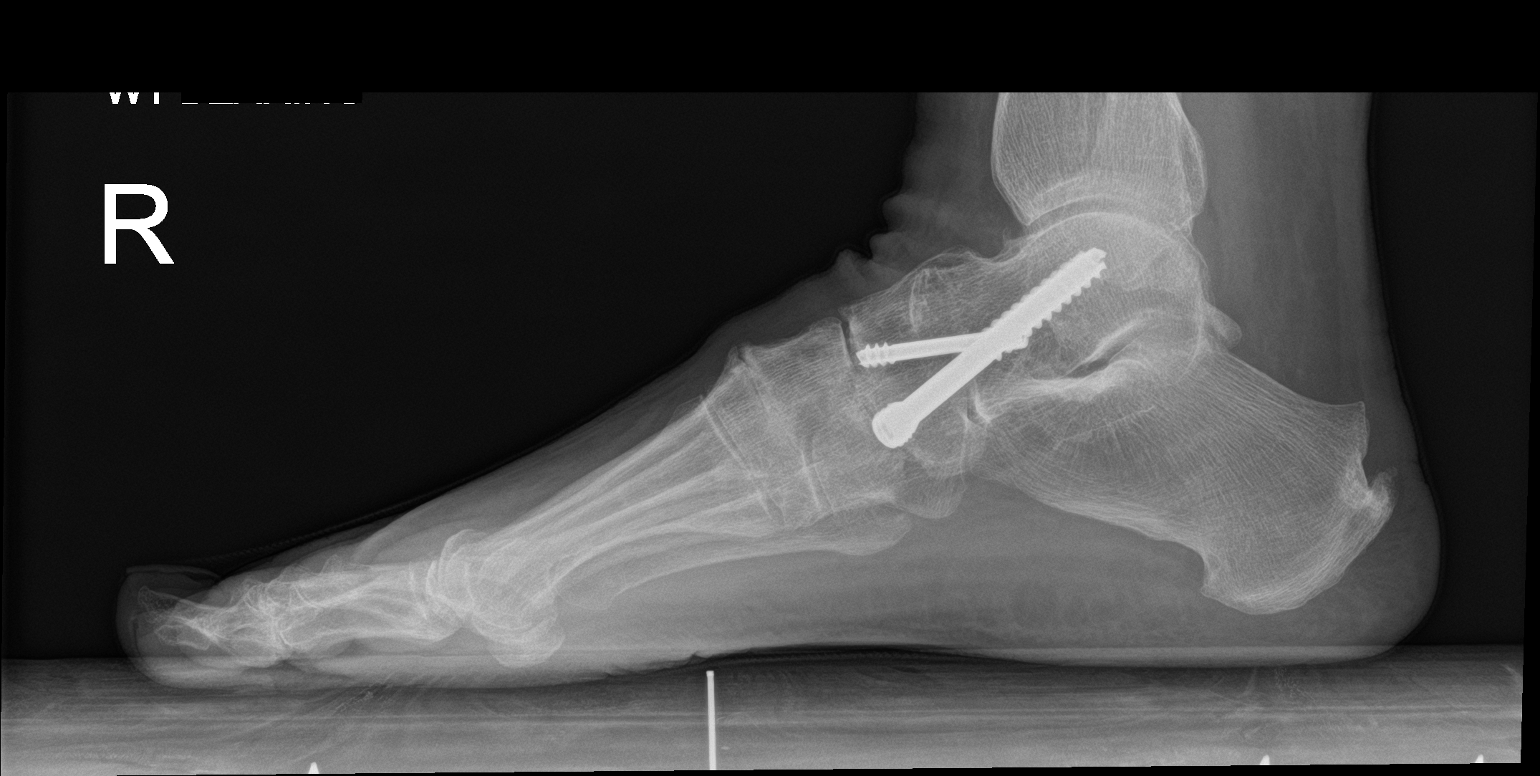

[3 of 3 positions shown; findings below may reference images not displayed]

FINDINGS: There again are 2 screws from prior talonavicular fusion with fusion
again seen. Moderate dorsal tarsometatarsal degenerative
osteophytosis on lateral view likely greatest at the second and
third tarsometatarsal joints. Moderate chronic spurring at the
Achilles insertion on the calcaneus. Tiny calcaneal heel spur. Small
anterior talar head-neck junction degenerative osteophytosis on
lateral view. Severe interphalangeal joint space narrowing
diffusely. Moderate great toe metatarsophalangeal joint space
narrowing.
IMPRESSION: 1. Status post remote talonavicular fusion.
2. Moderate midfoot osteoarthritis.

## 2023-04-04 MED ORDER — CYANOCOBALAMIN 1000 MCG/ML IJ SOLN
1000.0000 ug | Freq: Once | INTRAMUSCULAR | Status: AC
Start: 2023-04-04 — End: 2023-04-04
  Administered 2023-04-04: 1000 ug via INTRAMUSCULAR

## 2023-04-04 NOTE — Progress Notes (Signed)
Medical screening examination/treatment was performed by qualified clinical staff member and as supervising physician I was immediately available for consultation/collaboration. I have reviewed documentation and agree with assessment and plan.  Derrick Tiegs, DO  

## 2023-04-04 NOTE — Progress Notes (Signed)
Pt came in today for B12 injection.   Pt reports no negative side effects from medication. Denies any dizziness, chest pain or palpitations, and no GI problems..  Injection given in LD she tolerated well.  Pt will RTC in 30 days for next injection

## 2023-04-08 ENCOUNTER — Other Ambulatory Visit (HOSPITAL_COMMUNITY): Payer: Self-pay | Admitting: Psychiatry

## 2023-04-08 DIAGNOSIS — F322 Major depressive disorder, single episode, severe without psychotic features: Secondary | ICD-10-CM

## 2023-04-10 ENCOUNTER — Ambulatory Visit (INDEPENDENT_AMBULATORY_CARE_PROVIDER_SITE_OTHER): Payer: Medicare Other | Admitting: Psychiatry

## 2023-04-10 ENCOUNTER — Ambulatory Visit (INDEPENDENT_AMBULATORY_CARE_PROVIDER_SITE_OTHER): Payer: Medicare Other | Admitting: Nurse Practitioner

## 2023-04-10 ENCOUNTER — Encounter: Payer: Self-pay | Admitting: Nurse Practitioner

## 2023-04-10 ENCOUNTER — Encounter (HOSPITAL_COMMUNITY): Payer: Self-pay | Admitting: Psychiatry

## 2023-04-10 VITALS — BP 128/76 | HR 60 | Temp 98.0°F | Ht 64.0 in | Wt 176.0 lb

## 2023-04-10 VITALS — BP 134/70 | HR 62 | Temp 97.6°F | Ht 63.5 in | Wt 179.0 lb

## 2023-04-10 DIAGNOSIS — E538 Deficiency of other specified B group vitamins: Secondary | ICD-10-CM

## 2023-04-10 DIAGNOSIS — F5102 Adjustment insomnia: Secondary | ICD-10-CM | POA: Diagnosis not present

## 2023-04-10 DIAGNOSIS — R748 Abnormal levels of other serum enzymes: Secondary | ICD-10-CM

## 2023-04-10 DIAGNOSIS — F332 Major depressive disorder, recurrent severe without psychotic features: Secondary | ICD-10-CM

## 2023-04-10 DIAGNOSIS — F411 Generalized anxiety disorder: Secondary | ICD-10-CM

## 2023-04-10 DIAGNOSIS — Z6830 Body mass index (BMI) 30.0-30.9, adult: Secondary | ICD-10-CM

## 2023-04-10 DIAGNOSIS — R7303 Prediabetes: Secondary | ICD-10-CM

## 2023-04-10 DIAGNOSIS — F4321 Adjustment disorder with depressed mood: Secondary | ICD-10-CM | POA: Diagnosis not present

## 2023-04-10 DIAGNOSIS — E669 Obesity, unspecified: Secondary | ICD-10-CM | POA: Diagnosis not present
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
## 2023-04-10 NOTE — Progress Notes (Signed)
Office: [phone]  /  Fax: [phone]  WEIGHT SUMMARY AND BIOMETRICS  Weight Lost Since Last Visit: 2lb  No data recorded  Vitals Temp: 98 F (36.7 C) BP: 128/76 Pulse Rate: 60 SpO2: 96 %   Anthropometric Measurements Height: 5\' 4"  (1.626 m) Weight: 176 lb (79.8 kg) BMI (Calculated): 30.2 Weight at Last Visit: 178lb Weight Lost Since Last Visit: 2lb Starting Weight: 178lb Total Weight Loss (lbs): 2 lb (0.907 kg)   Body Composition  Body Fat %: 45.3 % Fat Mass (lbs): 80 lbs Muscle Mass (lbs): 91.8 lbs Total Body Water (lbs): 66.8 lbs Visceral Fat Rating : 13   Other Clinical Data Fasting: No Labs: No Today's Visit #: 2 Starting Date: 03/27/23     HPI  Chief Complaint: OBESITY  Heather Thompson is here to discuss her progress with her obesity treatment plan. She is on the the Category 2 Plan and states she is following her eating plan approximately 90-100 % of the time. She states she is exercising 25 minutes 3 days per week.   Interval History:  Since last office visit she has lost 2 pounds.  She doesn't feel she is eating enough. BF:  skips, snacks:  occ toast, juice, breakfast bars,  Lunch: sandwich with [place] and mayo, or protein shake or eggs and dinner: leftovers from eating out, cereal, cottage cheese. Snacks: jello cups, 2 cheese sticks, crackers.    She reports her intake is bad and she knows it.  Has struggled with this for years.  She doesn't finish anything. She doesn't eat anymore.  Since her husband passed away 3 years ago, she doesn't want or has the desire to cook. Doesn't sit down for meals.   Has stopped snacking on baked/sweets. She is drinking water and protein shakes.    She tracked one day and her calories were 625 and protein 31 grams.     Pharmacotherapy for weight loss: She is not currently taking medications  for medical weight loss.   Previous pharmacotherapy for medical weight loss:  None  Bariatric surgery:  Patient had gastric  bypass 27 years ago.  Her highest weight was 261 lbs and nadir weight was 154 lbs.      Vit B12 def Getting Vit B12 injections with Dr. Letta Median Vit B12 injection 04/04/23.  She has been getting monthly Vit B12 injections since 1997.    Elevated liver enzymes She is taking MV and magnesium.  Denies NSAIDS.  Denies alcohol intake.  Denies abd pain. Was told years ago that she has fatty liver.   Prediabetes Last A1c was 6.2  Medication(s): not currently on meds  Lab Results  Component Value Date   HGBA1C 6.2 (H) 12/19/2022   HGBA1C 6.0 (H) 04/06/2022   Lab Results  Component Value Date   INSULIN 8.7 03/27/2023    PHYSICAL EXAM:  Blood pressure 128/76, pulse 60, temperature 98 F (36.7 C), height 5\' 4"  (1.626 m), weight 176 lb (79.8 kg), SpO2 96 %. Body mass index is 30.21 kg/m.  General: She is overweight, cooperative, alert, well developed, and in no acute distress. PSYCH: Has normal mood, affect and thought process.   Extremities: No edema.  Neurologic: No gross sensory or motor deficits. No tremors or fasciculations noted.    DIAGNOSTIC DATA REVIEWED:  BMET    Component Value Date/Time   NA 137 03/27/2023 0957   K 4.2 03/27/2023 0957   CL 95 (L) 03/27/2023 0957   CO2 23 03/27/2023 0957   GLUCOSE  97 03/27/2023 0957   GLUCOSE 92 02/20/2023 1128   BUN 11 03/27/2023 0957   CREATININE 0.68 03/27/2023 0957   CREATININE 0.80 02/20/2023 1128   CALCIUM 9.8 03/27/2023 0957   GFRNONAA >60 09/07/2021 1433   Lab Results  Component Value Date   HGBA1C 6.2 (H) 12/19/2022   HGBA1C 6.0 (H) 04/06/2022   Lab Results  Component Value Date   INSULIN 8.7 03/27/2023   Lab Results  Component Value Date   TSH 4.550 (H) 03/27/2023   CBC    Component Value Date/Time   WBC 5.2 02/14/2023 1352   WBC 4.7 12/19/2022 1157   RBC 3.99 02/14/2023 1352   RBC 3.97 02/14/2023 1351   HGB 12.0 02/14/2023 1352   HCT 36.7 02/14/2023 1352   PLT 200 02/14/2023 1352   MCV 92.0  02/14/2023 1352   MCH 30.1 02/14/2023 1352   MCHC 32.7 02/14/2023 1352   RDW 13.0 02/14/2023 1352   Iron Studies    Component Value Date/Time   IRON 93 02/14/2023 1352   TIBC 392 02/14/2023 1352   FERRITIN 123 02/14/2023 1352   IRONPCTSAT 24 02/14/2023 1352   IRONPCTSAT 25 12/19/2022 1157   Lipid Panel     Component Value Date/Time   CHOL 213 (H) 02/20/2023 1128   TRIG 106 02/20/2023 1128   HDL 89 02/20/2023 1128   CHOLHDL 2.4 02/20/2023 1128   LDLCALC 104 (H) 02/20/2023 1128   Hepatic Function Panel     Component Value Date/Time   PROT 6.8 03/27/2023 0957   ALBUMIN 4.5 03/27/2023 0957   AST 42 (H) 03/27/2023 0957   AST 30 09/07/2021 1433   ALT 35 (H) 03/27/2023 0957   ALT 31 09/07/2021 1433   ALKPHOS 107 03/27/2023 0957   BILITOT 0.4 03/27/2023 0957   BILITOT 0.3 09/07/2021 1433      Component Value Date/Time   TSH 4.550 (H) 03/27/2023 0957   Nutritional Lab Results  Component Value Date   VD25OH 46.8 03/27/2023   VD25OH 78 07/10/2022     ASSESSMENT AND PLAN  TREATMENT PLAN FOR OBESITY:  Recommended Dietary Goals  Madhavi is currently in the action stage of change. As such, her goal is to continue weight management plan. She has agreed to the Category 2 Plan.  Behavioral Intervention  We discussed the following Behavioral Modification Strategies today: increasing lean protein intake, decreasing simple carbohydrates , increasing vegetables, increasing lower glycemic fruits, increasing fiber rich foods, avoiding skipping meals, increasing water intake, continue to practice mindfulness when eating, and planning for success.  Additional resources provided today: NA  Recommended Physical Activity Goals  Ruth has been advised to work up to 150 minutes of moderate intensity aerobic activity a week and strengthening exercises 2-3 times per week for cardiovascular health, weight loss maintenance and preservation of muscle mass.   She has agreed to Think about  ways to increase daily physical activity and overcoming barriers to exercise   ASSOCIATED CONDITIONS ADDRESSED TODAY  Action/Plan  Vitamin B12 deficiency Continue to follow up with PCP  Elevated liver enzymes -     Hepatic function panel  Based upon lab results, will refer to GI or have her follow up with her PCP.    Pre-diabetes -     Hemoglobin A1c  Annajane will continue to work on weight loss, exercise, and decreasing simple carbohydrates to help decrease the risk of diabetes.    Generalized obesity  BMI 30.0-30.9,adult      Labs reviewed in chart  from 03/27/23.  All questions answered.    She has an appt with her PCP in July.  Please keep follow up appt.    Return in about 2 weeks (around 04/24/2023).Marland Kitchen She was informed of the importance of frequent follow up visits to maximize her success with intensive lifestyle modifications for her multiple health conditions.   ATTESTASTION STATEMENTS:  Reviewed by clinician on day of visit: allergies, medications, problem list, medical history, surgical history, family history, social history, and previous encounter notes.   Time spent on visit including pre-visit chart review and post-visit care and charting was 40+ minutes.    Theodis Sato. Kalee Mcclenathan FNP-C

## 2023-04-11 LAB — HEPATIC FUNCTION PANEL
ALT: 38 IU/L — ABNORMAL HIGH (ref 0–32)
AST: 29 IU/L (ref 0–40)
Albumin: 4.6 g/dL (ref 3.8–4.8)
Alkaline Phosphatase: 98 IU/L (ref 44–121)
Bilirubin Total: 0.4 mg/dL (ref 0.0–1.2)
Bilirubin, Direct: 0.12 mg/dL (ref 0.00–0.40)
Total Protein: 7.2 g/dL (ref 6.0–8.5)

## 2023-04-11 LAB — HEMOGLOBIN A1C
Est. average glucose Bld gHb Est-mCnc: 131 mg/dL
Hgb A1c MFr Bld: 6.2 % — ABNORMAL HIGH (ref 4.8–5.6)

## 2023-04-12 ENCOUNTER — Ambulatory Visit (HOSPITAL_COMMUNITY): Payer: Medicare Other | Admitting: Psychiatry

## 2023-04-16 ENCOUNTER — Inpatient Hospital Stay: Payer: Medicare Other

## 2023-04-17 DIAGNOSIS — R1310 Dysphagia, unspecified: Secondary | ICD-10-CM

## 2023-04-17 HISTORY — DX: Dysphagia, unspecified: R13.10

## 2023-04-18 ENCOUNTER — Other Ambulatory Visit: Payer: Self-pay | Admitting: Medical-Surgical

## 2023-04-18 DIAGNOSIS — G4700 Insomnia, unspecified: Secondary | ICD-10-CM

## 2023-04-19 ENCOUNTER — Ambulatory Visit
Admission: EM | Admit: 2023-04-19 | Discharge: 2023-04-19 | Disposition: A | Discharge status: Home / Self Care | Payer: Medicare Other

## 2023-04-19 ENCOUNTER — Other Ambulatory Visit: Payer: Self-pay

## 2023-04-19 ENCOUNTER — Inpatient Hospital Stay: Payer: Medicare Other | Attending: Hematology & Oncology

## 2023-04-19 ENCOUNTER — Emergency Department (HOSPITAL_BASED_OUTPATIENT_CLINIC_OR_DEPARTMENT_OTHER): Payer: Medicare Other

## 2023-04-19 ENCOUNTER — Emergency Department (HOSPITAL_BASED_OUTPATIENT_CLINIC_OR_DEPARTMENT_OTHER)
Admission: EM | Admit: 2023-04-19 | Discharge: 2023-04-19 | Disposition: A | Discharge status: Home / Self Care | Payer: Medicare Other | Attending: Emergency Medicine | Admitting: Emergency Medicine

## 2023-04-19 ENCOUNTER — Encounter (HOSPITAL_BASED_OUTPATIENT_CLINIC_OR_DEPARTMENT_OTHER): Payer: Self-pay | Admitting: Emergency Medicine

## 2023-04-19 DIAGNOSIS — M79605 Pain in left leg: Secondary | ICD-10-CM

## 2023-04-19 NOTE — Patient Instructions (Signed)

## 2023-04-19 NOTE — ED Notes (Signed)
Patient is being discharged from the Urgent Care and sent to the Emergency Department via POV. Per Trevor Iha FNP, patient is in need of higher level of care due to need for DVT study to r/o blood clot. Patient is aware and verbalizes understanding of plan of care. There were no vitals filed for this visit.

## 2023-04-19 NOTE — ED Notes (Signed)
ED Provider at bedside. 

## 2023-04-19 NOTE — ED Notes (Signed)
Patient transported to X-ray 

## 2023-04-19 NOTE — ED Provider Notes (Signed)
[facility]    CSN: [id number] Arrival date & time: 04/19/23  1804      History   Chief Complaint Chief Complaint  Patient presents with   Leg Pain    LT    HPI Heather Thompson is a 72 y.o. female.   HPI Pleasant 72 year old female presents with left superior leg pain since earlier today.  Patient reports squeezing pain around upper thigh and not in front of her left leg PMH significant for clotting disorder, edema of both lower extremities and history of blood clots.  Past Medical History:  Diagnosis Date   Allergy    Anemia    Anemia    Anxiety    B12 deficiency    Back pain    Chronic fatigue syndrome    Clotting disorder (HCC)    Constipation    Depression    Depression    Edema of both lower extremities    Fatty liver    Fibromyalgia    Gallbladder problem    High cholesterol    History of blood clots    History of Roux-en-Y gastric bypass    Hypertension    Hypothyroidism    Hypothyroidism    Infertility, female    Joint pain    Kidney stones    Muscle disorder    Muscle disorder    Myoadenylate deaminase deficiency (HCC)    Osteoarthritis    Prediabetes    Salzmann nodular degeneration    SOBOE (shortness of breath on exertion)    Swallowing difficulty    Vitamin D deficiency     Patient Active Problem List   Diagnosis Date Noted   Elevated liver enzymes 04/02/2023   Elevated TSH 04/02/2023   Unspecified inflammatory spondylopathy, 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   Primary osteoarthritis of both first carpometacarpal joints 12/21/2021   Primary osteoarthritis of both knees 12/21/2021   Port-A-Cath in place 07/29/2021   Acquired hypothyroidism 07/29/2021   Chronic fatigue 07/29/2021   Vitamin B12 deficiency 07/29/2021   Vitamin D deficiency 07/29/2021   Postmenopausal estrogen deficiency 07/29/2021   IBS (irritable bowel syndrome) 04/06/2021   Retention of urine 04/06/2021   Herniated lumbar intervertebral disc 11/12/2020    Skin sensation disturbance 11/12/2020   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   Spondylopathy, unspecified 11/12/2020   Trochanteric bursitis, left hip 11/19/2019   Trochanteric bursitis, right hip 11/19/2019   Low serum iron 04/23/2019   Osteomalacia 04/23/2019   Abnormality of gait 04/23/2019   Presence of right artificial hip joint 06/26/2018   Gastroesophageal reflux disease 06/18/2018   Hypertension 06/18/2018   Intertrigo 04/30/2018   Lentigines 04/30/2018   Hyponatremia 01/17/2018   History of left hip replacement 01/14/2018   History of cervical spinal surgery 01/01/2018   Obesity (BMI 30-39.9) 01/01/2018   SK (seborrheic keratosis) 04/18/2017   Pain management contract agreement 11/17/2016   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   Allergic rhinitis due to animal hair and dander 12/02/2015   Low back pain 03/03/2015   Urinary incontinence 10/30/2013   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   Neutrophilic leukocytosis 08/12/2012   Thrombocytosis 08/12/2012   Osteoporosis, senile 11/15/2010   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   Hyperlipidemia 08/19/2010   Impaired fasting glucose 08/19/2010   Polyarthritis 09/28/2009   Degenerative disc disease 04/07/2009   Depression with anxiety 04/07/2009   Myopathy 04/07/2009   Insomnia 04/07/2009   Migraine 04/07/2009   Scoliosis 04/07/2009   Seasonal allergies 04/07/2009   Vitamin B12 deficiency anemia 04/07/2009  Past Surgical History:  Procedure Laterality Date   ABDOMINAL HYSTERECTOMY     APPENDECTOMY     CERVICAL FUSION     CHOLECYSTECTOMY     FOOT SURGERY Bilateral    fusion   GASTRIC BYPASS     TOTAL HIP ARTHROPLASTY Bilateral    11/2016 & 05/2017    OB History     Gravida  2   Para  2   Term      Preterm      AB      Living         SAB      IAB      Ectopic      Multiple      Live Births               Home Medications    Prior to Admission medications   Medication Sig Start Date End Date Taking?  Authorizing Provider  buPROPion (WELLBUTRIN XL) 300 MG 24 hr tablet TAKE 1 TABLET BY MOUTH EVERY DAY 03/16/23   Christen Butter, NP  desvenlafaxine (PRISTIQ) 100 MG 24 hr tablet TAKE 1 TABLET BY MOUTH EVERY DAY 04/09/23   Thresa Ross, MD  famotidine (PEPCID) 40 MG tablet Take 40 mg by mouth daily.    [provider]  FLUoxetine (PROZAC) 10 MG capsule TAKE 1 CAPSULE BY MOUTH EVERY DAY 03/05/23   Thresa Ross, MD  HYDROmorphone (DILAUDID) 2 MG tablet Take 2 mg by mouth daily. 07/12/22   [provider]  hydrOXYzine (ATARAX) 10 MG tablet TAKE 1 TABLET BY MOUTH AT BEDTIME AS NEEDED. 03/29/23   Thresa Ross, MD  lisinopril-hydrochlorothiazide (ZESTORETIC) 10-12.5 MG tablet Take 1 tablet by mouth daily. 12/12/22   Christen Butter, NP  Magnesium 250 MG TABS Take 1 tablet by mouth daily.    [provider]  melatonin 5 MG TABS Take 10 mg by mouth at bedtime.    [provider]  Multiple Vitamin (MULTI-VITAMIN) tablet Take 1 tablet by mouth daily.    [provider]  nystatin (MYCOSTATIN/NYSTOP) powder APPLY TO THE AFFECTED AREA(S) EXTERNALLY TWICE DAILY 12/19/22   Christen Butter, NP  oxybutynin (DITROPAN-XL) 5 MG 24 hr tablet TAKE 1 TABLET BY MOUTH EVERYDAY AT BEDTIME 03/21/23   Christen Butter, NP  pantoprazole (PROTONIX) 40 MG tablet Take 40 mg by mouth 2 (two) times daily before a meal. 05/10/22   [provider]  pravastatin (PRAVACHOL) 20 MG tablet Take 1 tablet (20 mg total) by mouth daily. TAKE 1 TABLET(10 MG) BY MOUTH DAILY 12/21/22   Christen Butter, NP  propranolol (INDERAL) 20 MG tablet TAKE 1 TABLET(20 MG) BY MOUTH DAILY AS NEEDED 03/05/23   Christen Butter, NP  temazepam (RESTORIL) 22.5 MG capsule TAKE ONE CAPSULE BY MOUTH AT NIGHT 04/18/23   Christen Butter, NP  tiZANidine (ZANAFLEX) 2 MG tablet Take 2 mg by mouth in the morning and at bedtime. 07/12/22   [provider]    Family History Family History  Problem Relation Age of Onset   Hyperlipidemia Mother     Hypertension Mother    Heart attack Mother    Heart disease Mother    Sudden death Mother    Depression Mother    Anxiety disorder Mother    Obesity Mother    Anxiety disorder Father    Depression Father    Cancer Father    Hyperlipidemia Father    Hypertension Father    Lung cancer Father    Prostate  cancer Father    Kidney cancer Brother    Healthy Daughter    Healthy Son     Social History Social History   Tobacco Use   Smoking status: Never    Passive exposure: Past   Smokeless tobacco: Never  Vaping Use   Vaping Use: Never used  Substance Use Topics   Alcohol use: Not Currently    Comment: rarely   Drug use: Never     Allergies   Compazine [prochlorperazine], Phenothiazines, Pregabalin, and Prednisone   Review of Systems Review of Systems  Musculoskeletal:        Left leg pain  All other systems reviewed and are negative.    Physical Exam Triage Vital Signs ED Triage Vitals [04/19/23 1821]  Enc Vitals Group     BP      Pulse      Resp      Temp      Temp src      SpO2      Weight      Height      Head Circumference      Peak Flow      Pain Score 6     Pain Loc      Pain Edu?      Excl. in GC?    No data found.  Updated Vital Signs BP (!) 177/90 (BP Location: Right Arm)   Pulse 60   Temp 98.4 F (36.9 C)   Resp 17   SpO2 98%     Physical Exam Vitals and nursing note reviewed.  Constitutional:      Appearance: Normal appearance. She is obese.  HENT:     Head: Normocephalic and atraumatic.     Right Ear: Tympanic membrane, ear canal and external ear normal.     Left Ear: Tympanic membrane, ear canal and external ear normal.     Mouth/Throat:     Mouth: Mucous membranes are moist.     Pharynx: Oropharynx is clear.  Eyes:     Extraocular Movements: Extraocular movements intact.     Conjunctiva/sclera: Conjunctivae normal.     Pupils: Pupils are equal, round, and reactive to light.  Cardiovascular:     Rate and Rhythm:  Normal rate and regular rhythm.     Pulses: Normal pulses.     Heart sounds: Normal heart sounds.     Comments: Left leg pulses: Femoral, popliteal, PT, and DP +2 and bounding Pulmonary:     Effort: Pulmonary effort is normal.     Breath sounds: Normal breath sounds. No wheezing, rhonchi or rales.  Musculoskeletal:        General: Normal range of motion.     Cervical back: Normal range of motion and neck supple.     Comments: Left upper leg(superior anterior aspect): Non-tender palpable cyst/muscular adhesion/other noted  Skin:    General: Skin is warm and dry.  Neurological:     General: No focal deficit present.     Mental Status: She is alert and oriented to person, place, and time. Mental status is at baseline.  Psychiatric:        Mood and Affect: Mood normal.        Behavior: Behavior normal.        Thought Content: Thought content normal.      UC Treatments / Results  Labs (all labs ordered are listed, but only abnormal results are displayed) Labs Reviewed - No data to display  EKG  Radiology No results found.  Procedures Procedures (including critical care time)  Medications Ordered in UC Medications - No data to display  Initial Impression / Assessment and Plan / UC Course  I have reviewed the triage vital signs and the nursing notes.  Pertinent labs & imaging results that were available during my care of the patient were reviewed by me and considered in my medical decision making (see chart for details).     MDM: 1.  Left leg pain-advised patient to go to [facility] ED now for further evaluation of left leg pain and to rule out DVT patient agreed and verbalized understanding of these instructions and this plan of care this evening.  Patient discharged to ED, hemodynamically stable. Final Clinical Impressions(s) / UC Diagnoses   Final diagnoses:  Left leg pain     Discharge Instructions      Advised patient to go to [facility] ED now for further evaluation of left leg pain and to rule out possible DVT.     ED Prescriptions   None    PDMP not reviewed this encounter.   Trevor Iha, FNP 04/19/23 [phone]

## 2023-04-19 NOTE — Discharge Instructions (Signed)
Advised patient to go to [facility] ED now for further evaluation of left leg pain and to rule out possible DVT.

## 2023-04-19 NOTE — ED Provider Notes (Signed)
[facility] Provider Note   CSN: [id number] Arrival date & time: 04/19/23  1859     History Chief Complaint  Patient presents with   Leg Pain    HPI Heather Thompson is a 72 y.o. female presenting for chief complaint of left leg . Patient endorses severe left thigh pain which is acute and chronic.  Endorses history of orthopedic injury at the location as well as a history of neuropathy neuralgia follows with chronic pain, multiple subspecialist for her pain.  Endorses a squeezing sensation in her Extremity sent in for DVT rule out  Patient's recorded medical, surgical, social, medication list and allergies were reviewed in the Snapshot window as part of the initial history.   Review of Systems   Review of Systems  Constitutional:  Negative for chills and fever.  HENT:  Negative for ear pain and sore throat.   Eyes:  Negative for pain and visual disturbance.  Respiratory:  Negative for cough and shortness of breath.   Cardiovascular:  Positive for leg swelling. Negative for chest pain and palpitations.  Gastrointestinal:  Negative for abdominal pain and vomiting.  Genitourinary:  Negative for dysuria and hematuria.  Musculoskeletal:  Positive for joint swelling and myalgias. Negative for arthralgias and back pain.  Skin:  Negative for color change and rash.  Neurological:  Negative for seizures and syncope.  All other systems reviewed and are negative.   Physical Exam Updated Vital Signs BP (!) 169/85 (BP Location: Right Arm)   Pulse 97   Temp 98.6 F (37 C) (Oral)   Resp 16   Ht 5\' 4"  (1.626 m)   Wt 81.6 kg   SpO2 100%   BMI 30.90 kg/m  Physical Exam Vitals and nursing note reviewed.  Constitutional:      General: She is not in acute distress.    Appearance: She is well-developed.  HENT:     Head: Normocephalic and atraumatic.  Eyes:     Conjunctiva/sclera: Conjunctivae normal.  Cardiovascular:     Rate and Rhythm:  Normal rate and regular rhythm.     Heart sounds: No murmur heard. Pulmonary:     Effort: Pulmonary effort is normal. No respiratory distress.     Breath sounds: Normal breath sounds.  Abdominal:     General: There is no distension.     Palpations: Abdomen is soft.     Tenderness: There is no abdominal tenderness. There is no right CVA tenderness or left CVA tenderness.  Musculoskeletal:        General: Tenderness present. No swelling or signs of injury. Normal range of motion.     Cervical back: Neck supple.  Skin:    General: Skin is warm and dry.  Neurological:     General: No focal deficit present.     Mental Status: She is alert and oriented to person, place, and time. Mental status is at baseline.     Cranial Nerves: No cranial nerve deficit.      ED Course/ Medical Decision Making/ A&P    Procedures Procedures   Medications Ordered in ED Medications - No data to display  Medical Decision Making:    Heather Thompson is a 72 y.o. female who presented to the ED today with left lower extremity pain detailed above.     Complete initial physical exam performed, notably the patient  was hemodynamically stable in no acute distress.      Reviewed and confirmed nursing documentation for  past medical history, family history, social history.    Initial Assessment:   With the patient's presentation of single lower extremity pain, most likely diagnosis is musculoskeletal etiology. Other diagnoses were considered including (but not limited to) orthopedic injury, metabolic abnormality, vascular etiology. These are considered less likely due to history of present illness and physical exam findings.   This is most consistent with an acute life/limb threatening illness complicated by underlying chronic conditions.  Initial Plan:  DVT study Pelvis ultrasound to evaluate for orthopedic injury Objective evaluation as below reviewed with plan for close reassessment  Initial Study Results:    .    Radiology  All images reviewed independently. Agree with radiology report at this time.   DG HIP UNILAT W OR W/O PELVIS 2-3 VIEWS LEFT  Result Date: 04/19/2023 CLINICAL DATA:  Leg pain, history of sciatica. EXAM: DG HIP (WITH OR WITHOUT PELVIS) 2-3V LEFT COMPARISON:  None Available. FINDINGS: Total hip arthroplasty changes are present bilaterally. There is no evidence of hardware loosening. No acute fracture or dislocation is seen. Degenerative changes are noted in the lower lumbar spine. Vascular calcifications are present in the left lower extremity. IMPRESSION: 1. Total hip arthroplasty changes bilaterally without evidence of acute fracture, dislocation, or hardware loosening. 2. Degenerative changes in the lower lumbar spine. Electronically Signed   By: Thornell Sartorius M.D.   On: 04/19/2023 21:45   US Venous Img Lower Unilateral Left  Result Date: 04/19/2023 CLINICAL DATA:  Left lower extremity pain EXAM: Left LOWER EXTREMITY VENOUS DOPPLER ULTRASOUND TECHNIQUE: Gray-scale sonography with compression, as well as color and duplex ultrasound, were performed to evaluate the deep venous system(s) from the level of the common femoral vein through the popliteal and proximal calf veins. COMPARISON:  None Available. FINDINGS: VENOUS Normal compressibility of the common femoral, superficial femoral, and popliteal veins, as well as the visualized calf veins. Visualized portions of profunda femoral vein and great saphenous vein unremarkable. No filling defects to suggest DVT on grayscale or color Doppler imaging. Doppler waveforms show normal direction of venous flow, normal respiratory plasticity and response to augmentation. Limited views of the contralateral common femoral vein are unremarkable. OTHER Limited sonography of the posterior left thigh/inferior left buttocks performed in the region of pain demonstrating no specific abnormality. Limitations: none IMPRESSION: Negative. Electronically Signed    By: Jasmine Pang M.D.   On: 04/19/2023 19:54     Final Assessment and Plan:   No acute pathology detected.  Patient is ambulatory tolerating p.o. intake in no acute distress.  Uncertain underlying etiology of her pain.  Recommend she follow-up with her chronic pain providers for further diagnostic care and management.   Disposition:  I have considered need for hospitalization, however, considering all of the above, I believe this patient is stable for discharge at this time.  Patient/family educated about specific return precautions for given chief complaint and symptoms.  Patient/family educated about follow-up with PCP.  Patient/family expressed understanding of return precautions and need for follow-up. Patient spoken to regarding all imaging and laboratory results and appropriate follow up for these results. All education provided in verbal form with additional information in written form. Time was allowed for answering of patient questions. Patient discharged.    Emergency Department Medication Summary:   Medications - No data to display       Clinical Impression:  1. Left leg pain      Discharge   Final Clinical Impression(s) / ED Diagnoses Final diagnoses:  Left leg  pain    Rx / DC Orders ED Discharge Orders     None         Glyn Ade, MD 04/19/23 2306

## 2023-04-19 NOTE — ED Notes (Signed)
Patient ambulatory to BR and back.

## 2023-04-19 NOTE — ED Triage Notes (Addendum)
Pt c/o LT leg pain since earlier today. Sees pain mgt for spine issues. States the pain feels different than sciatica. Squeezing pain around upper thigh, and a knot in the front of her leg. Pain 6/10 Taking hydromorphone and tizanidine prn.

## 2023-04-19 NOTE — ED Triage Notes (Signed)
Patient POV from [facility] in [place] to rule out a possible blood clot in the L leg. Patient states this afternoon she developed pain in the posterior region just under the buttocks/ upper thigh. States this pain wraps around to the front of the thigh and she feels like she has a knot in her thigh. Patient has hx of sciatica but states this feels different than the usual pain. Denies any known or recent trauma. States she had a DVT when she was a teenager but hasn't had one since. Aox4. Denies SHOB or chest pain. Not anticoagulated.

## 2023-04-24 ENCOUNTER — Encounter: Payer: Self-pay | Admitting: Medical-Surgical

## 2023-04-24 ENCOUNTER — Ambulatory Visit (INDEPENDENT_AMBULATORY_CARE_PROVIDER_SITE_OTHER): Payer: Medicare Other | Admitting: Medical-Surgical

## 2023-04-24 VITALS — BP 129/75 | HR 68 | Resp 20 | Ht 64.0 in | Wt 186.0 lb

## 2023-04-24 DIAGNOSIS — M60852 Other myositis, left thigh: Secondary | ICD-10-CM

## 2023-04-24 DIAGNOSIS — M5416 Radiculopathy, lumbar region: Secondary | ICD-10-CM

## 2023-04-24 DIAGNOSIS — M461 Sacroiliitis, not elsewhere classified: Secondary | ICD-10-CM | POA: Insufficient documentation

## 2023-04-24 DIAGNOSIS — R32 Unspecified urinary incontinence: Secondary | ICD-10-CM

## 2023-04-24 DIAGNOSIS — R29898 Other symptoms and signs involving the musculoskeletal system: Secondary | ICD-10-CM | POA: Diagnosis not present

## 2023-04-24 HISTORY — DX: Sacroiliitis, not elsewhere classified: M46.1

## 2023-04-24 HISTORY — DX: Other myositis, left thigh: M60.852

## 2023-04-24 MED ORDER — KETOROLAC TROMETHAMINE 60 MG/2ML IM SOLN
60.0000 mg | Freq: Once | INTRAMUSCULAR | Status: AC
Start: 2023-04-24 — End: 2023-04-24
  Administered 2023-04-24: 60 mg via INTRAMUSCULAR

## 2023-04-24 NOTE — Progress Notes (Signed)
        Established patient visit  History, exam, impression, and plan:  1. Lumbar radiculopathy 2. Urinary incontinence, unspecified type 3. Weakness of both lower extremities Very pleasant 72 year old female presenting today with a history of diffuse chronic pain now managed by Dr. Oneal Grout with pain management.  Has been doing fairly well overall however on Thursday evening she notes that she had a severe "pop" in the left buttock that happened when she went to step up using her left leg.  A pop was accompanied by a very sharp pain which then changed to a feeling of an elastic band squeezing off the top of her left leg.  At the time of the pain was crushing as well as squeezing.  She went to see urgent care who sent her to the ED to rule out DVT.  She had an ultrasound as well as hip x-rays with no acute findings.  Today, she reports that the stabbing pain is better in the elastic band feeling around the top of the leg is better however it has now moved around to the low back and left buttock.  She has had some worsening of her bilateral lower extremity weakness and is now having difficulty stepping up and down with her right leg.  History of bilateral hip and thigh numbness after bilateral hip replacements.  Reports that her current pain in the low back as well is in the legs feels much different than her typical baseline level of pain.  Feels that her balance is not right and levels more often than she was.  No falls yet.  Over the weekend, she reached out to her pain management provider however there was no one on call and no backup noted.  They will not open until tomorrow morning.  On exam, intermittently unsteady gait with worsened weakness of the right lower extremity hindering ability to step up.  Slow to get started with rising and walking.  Reports new onset urinary incontinence last night in conjunction with lower extremity weakness.  Last MRI of the lumbar spine in 2023 but with recent changes  and concerning red flag symptoms, updating MRI of the lumbar spine today.  Advised her to reach out to her pain management provider first thing in the morning for any recommendations they may have.  Toradol 60 mg IM x 1 given in office today.  Patient worried that this may affect her pain management contract so a letter providing rationale was provided today for her peace of mind. - MR Lumbar Spine Wo Contrast; Future - ketorolac (TORADOL) injection 60 mg   Procedures performed this visit: None.  Return if symptoms worsen or fail to improve.  __________________________________ Thayer Ohm, DNP, APRN, FNP-BC Primary Care and Sports Medicine [facility] [place]

## 2023-04-25 ENCOUNTER — Telehealth: Payer: Self-pay | Admitting: Medical-Surgical

## 2023-04-25 ENCOUNTER — Ambulatory Visit (HOSPITAL_BASED_OUTPATIENT_CLINIC_OR_DEPARTMENT_OTHER)
Admission: RE | Admit: 2023-04-25 | Discharge: 2023-04-25 | Disposition: A | Discharge status: Home / Self Care | Payer: Medicare Other | Source: Ambulatory Visit | Attending: Medical-Surgical | Admitting: Medical-Surgical

## 2023-04-25 DIAGNOSIS — M5416 Radiculopathy, lumbar region: Secondary | ICD-10-CM

## 2023-04-25 DIAGNOSIS — R29898 Other symptoms and signs involving the musculoskeletal system: Secondary | ICD-10-CM

## 2023-04-25 DIAGNOSIS — M180 Bilateral primary osteoarthritis of first carpometacarpal joints: Secondary | ICD-10-CM

## 2023-04-25 DIAGNOSIS — M4692 Unspecified inflammatory spondylopathy, cervical region: Secondary | ICD-10-CM

## 2023-04-25 DIAGNOSIS — R32 Unspecified urinary incontinence: Secondary | ICD-10-CM

## 2023-04-25 DIAGNOSIS — M170 Bilateral primary osteoarthritis of knee: Secondary | ICD-10-CM

## 2023-04-25 DIAGNOSIS — M4302 Spondylolysis, cervical region: Secondary | ICD-10-CM

## 2023-04-25 NOTE — Telephone Encounter (Signed)
Patient called she would like to start physical therapy Please advise

## 2023-04-25 NOTE — Telephone Encounter (Signed)
Referral placed for physical therapy here in our [place] location.

## 2023-04-25 NOTE — Telephone Encounter (Signed)
Pt called.  Legs are weak/they're no better.

## 2023-04-25 NOTE — Telephone Encounter (Signed)
Patient informed and will await call from PT to schedule.

## 2023-04-30 ENCOUNTER — Encounter: Payer: Self-pay | Admitting: Sports Medicine

## 2023-04-30 ENCOUNTER — Ambulatory Visit (INDEPENDENT_AMBULATORY_CARE_PROVIDER_SITE_OTHER): Payer: Medicare Other | Admitting: Sports Medicine

## 2023-04-30 DIAGNOSIS — M7061 Trochanteric bursitis, right hip: Secondary | ICD-10-CM

## 2023-04-30 DIAGNOSIS — E538 Deficiency of other specified B group vitamins: Secondary | ICD-10-CM

## 2023-04-30 DIAGNOSIS — M24852 Other specific joint derangements of left hip, not elsewhere classified: Secondary | ICD-10-CM | POA: Insufficient documentation

## 2023-04-30 DIAGNOSIS — M7062 Trochanteric bursitis, left hip: Secondary | ICD-10-CM

## 2023-04-30 DIAGNOSIS — R32 Unspecified urinary incontinence: Secondary | ICD-10-CM

## 2023-04-30 HISTORY — DX: Other specific joint derangements of left hip, not elsewhere classified: M24.852

## 2023-04-30 MED ORDER — CYANOCOBALAMIN 1000 MCG/ML IJ SOLN
1000.0000 ug | Freq: Once | INTRAMUSCULAR | Status: AC
Start: 2023-04-30 — End: 2023-04-30
  Administered 2023-04-30: 1000 ug via INTRAMUSCULAR

## 2023-04-30 NOTE — Assessment & Plan Note (Signed)
Has had a couple of episodes of increasing incontinence, her PCP has referred her to urology, I think this is appropriate, we will get a urinalysis and urine culture today, based on her symptoms this is likely overactive bladder/detrusor instability.

## 2023-04-30 NOTE — Progress Notes (Signed)
    Procedures performed today:    None.  Independent interpretation of notes and tests performed by another provider:   None.  Brief History, Exam, Impression, and Recommendations:    Trochanteric bursitis of both hips Heather Thompson is a pleasant 72 year old female, she presents with multiple problems, she does have bilateral lateral hip pain, known history of trochanteric bursitis, explained to her the importance of hip abductor strengthening, her hip abductor's are in fact quite weak today and she has tenderness at the greater trochanter. I explained the importance of aggressive physical therapy. She is going to be starting in [place] rehab specialists, I am going to send an updated referral with instructions on aggressive hip abductor strengthening. She is status post bilateral hip arthroplasty.  Snapping hip syndrome, left The patient also endorses an episode of taking a step and feeling a pop within her buttock and anterior thigh, she then had pain anterior thigh with a bandlike sensation around the upper thigh, she was seen in the ED, she had DVT ultrasound that was unrevealing, she had hip x-rays that showed uncomplicated bilateral hip prostheses.  She was seen by her PCP, she did mention some urinary incontinence, considering the upper thigh pain and the urinary incontinence an MRI was ordered that showed multiple pathologic findings, she has also had multiple decompressions. She has been referred to neurosurgery, she is also following up with her pain management provider. On exam I am able to reproduce the bandlike sensation with resisted hip flexion consistent with a iliopsoas tendon pain generator, this also explains the bandlike pain and popping sensation. Would like her physical therapist to work aggressively on her hip flexor tendon on the left.  Incontinence Has had a couple of episodes of increasing incontinence, her PCP has referred her to urology, I think this is appropriate,  we will get a urinalysis and urine culture today, based on her symptoms this is likely overactive bladder/detrusor instability.  I spent 30 minutes of total time managing this patient today, this includes chart review, face to face, and non-face to face time.  ____________________________________________ Heather Thompson. Heather Thompson, M.D., ABFM., CAQSM., AME. Primary Care and Sports Medicine [facility]  Adjunct Professor of Family Medicine  [employer]  Restaurant manager, fast food

## 2023-04-30 NOTE — Assessment & Plan Note (Signed)
Heather Thompson is a pleasant 72 year old female, she presents with multiple problems, she does have bilateral lateral hip pain, known history of trochanteric bursitis, explained to her the importance of hip abductor strengthening, her hip abductor's are in fact quite weak today and she has tenderness at the greater trochanter. I explained the importance of aggressive physical therapy. She is going to be starting in [facility], I am going to send an updated referral with instructions on aggressive hip abductor strengthening. She is status post bilateral hip arthroplasty.

## 2023-04-30 NOTE — Addendum Note (Signed)
Addended by: Carren Rang A on: 04/30/2023 04:33 PM   Modules accepted: Orders

## 2023-04-30 NOTE — Assessment & Plan Note (Signed)
The patient also endorses an episode of taking a step and feeling a pop within her buttock and anterior thigh, she then had pain anterior thigh with a bandlike sensation around the upper thigh, she was seen in the ED, she had DVT ultrasound that was unrevealing, she had hip x-rays that showed uncomplicated bilateral hip prostheses.  She was seen by her PCP, she did mention some urinary incontinence, considering the upper thigh pain and the urinary incontinence an MRI was ordered that showed multiple pathologic findings, she has also had multiple decompressions. She has been referred to neurosurgery, she is also following up with her pain management provider. On exam I am able to reproduce the bandlike sensation with resisted hip flexion consistent with a iliopsoas tendon pain generator, this also explains the bandlike pain and popping sensation. Would like her physical therapist to work aggressively on her hip flexor tendon on the left.

## 2023-05-01 LAB — URINALYSIS W MICROSCOPIC + REFLEX CULTURE
Bacteria, UA: NONE SEEN /HPF
Bilirubin Urine: NEGATIVE
Glucose, UA: NEGATIVE
Hgb urine dipstick: NEGATIVE
Hyaline Cast: NONE SEEN /LPF
Ketones, ur: NEGATIVE
Leukocyte Esterase: NEGATIVE
Nitrites, Initial: NEGATIVE
Protein, ur: NEGATIVE
Specific Gravity, Urine: 1.014 (ref 1.001–1.035)
Squamous Epithelial / HPF: NONE SEEN /HPF (ref <=5)
WBC, UA: NONE SEEN /HPF (ref 0–5)
pH: 7 (ref 5.0–8.0)

## 2023-05-01 LAB — NO CULTURE INDICATED

## 2023-05-02 ENCOUNTER — Ambulatory Visit: Payer: Medicare Other

## 2023-05-07 NOTE — Therapy (Signed)
OUTPATIENT PHYSICAL THERAPY THORACOLUMBAR EVALUATION   Patient Name: Heather Thompson MRN: [id number] DOB:[birth year], 72 y.o., female Today's Date: 05/08/2023  END OF SESSION:  PT End of Session - 05/08/23 0805     Visit Number 1    Date for PT Re-Evaluation 07/31/23    Authorization Type MCR    Progress Note Due on Visit 10    PT Start Time 0805    PT Stop Time 0845    PT Time Calculation (min) 40 min    Activity Tolerance Patient tolerated treatment well    Behavior During Therapy WFL for tasks assessed/performed             Past Medical History:  Diagnosis Date   Allergy    Anemia    Anemia    Anxiety    B12 deficiency    Back pain    Chronic fatigue syndrome    Clotting disorder (HCC)    Constipation    Depression    Depression    Edema of both lower extremities    Fatty liver    Fibromyalgia    Gallbladder problem    High cholesterol    History of blood clots    History of Roux-en-Y gastric bypass    Hypertension    Hypothyroidism    Hypothyroidism    Infertility, female    Joint pain    Kidney stones    Muscle disorder    Muscle disorder    Myoadenylate deaminase deficiency (HCC)    Osteoarthritis    Prediabetes    Salzmann nodular degeneration    SOBOE (shortness of breath on exertion)    Swallowing difficulty    Vitamin D deficiency    Past Surgical History:  Procedure Laterality Date   ABDOMINAL HYSTERECTOMY     APPENDECTOMY     CERVICAL FUSION     CHOLECYSTECTOMY     FOOT SURGERY Bilateral    fusion   GASTRIC BYPASS     TOTAL HIP ARTHROPLASTY Bilateral    11/2016 & 05/2017   Patient Active Problem List   Diagnosis Date Noted   Snapping hip syndrome, 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   Other myositis, left thigh 04/24/2023   Sacroiliitis, not elsewhere classified (HCC) 04/24/2023   Dysphagia 04/17/2023   Elevated liver enzymes 04/02/2023   Elevated TSH 04/02/2023   Chronic throat clearing 03/14/2023   Globus sensation 03/14/2023   Hoarseness  03/14/2023   Muscle tension dysphonia 03/12/2023   Unspecified inflammatory spondylopathy, 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   Primary osteoarthritis of both first carpometacarpal joints 12/21/2021   Primary osteoarthritis of both knees 12/21/2021   Port-A-Cath in place 07/29/2021   Acquired hypothyroidism 07/29/2021   Chronic fatigue 07/29/2021   Vitamin B12 deficiency 07/29/2021   Vitamin D deficiency 07/29/2021   Postmenopausal estrogen deficiency 07/29/2021   IBS (irritable bowel syndrome) 04/06/2021   Retention of urine 04/06/2021   Herniated lumbar intervertebral disc 11/12/2020   Skin sensation disturbance 11/12/2020   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   Spondylopathy, unspecified 11/12/2020   Trochanteric bursitis of both hips 11/19/2019   Low serum iron 04/23/2019   Osteomalacia 04/23/2019   Abnormality of gait 04/23/2019   Presence of right artificial hip joint 06/26/2018   Gastroesophageal reflux disease 06/18/2018   Hypertension 06/18/2018   Intertrigo 04/30/2018   Lentigines 04/30/2018   Hyponatremia 01/17/2018   History of left hip replacement 01/14/2018   History of cervical spinal surgery 01/01/2018   Obesity (BMI 30-39.9) 01/01/2018   SK (seborrheic  keratosis) 04/18/2017   Pain management contract agreement 11/17/2016   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   Allergic rhinitis due to animal hair and dander 12/02/2015   Low back pain 03/03/2015   Incontinence 10/30/2013   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   Neutrophilic leukocytosis 08/12/2012   Thrombocytosis 08/12/2012   Osteoporosis, senile 11/15/2010   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   Hyperlipidemia 08/19/2010   Impaired fasting glucose 08/19/2010   Polyarthritis 09/28/2009   Degenerative disc disease 04/07/2009   Depression with anxiety 04/07/2009   Myopathy 04/07/2009   Insomnia 04/07/2009   Migraine 04/07/2009   Scoliosis 04/07/2009   Seasonal allergies 04/07/2009   Vitamin B12 deficiency anemia  04/07/2009    PCP: Christen Butter, NP   REFERRING PROVIDER: Christen Butter, NP   REFERRING DIAG:  54.16 (ICD-10-CM) - Lumbar radiculopathy  M17.0 (ICD-10-CM) - Primary osteoarthritis of both knees  M18.0 (ICD-10-CM) - Primary osteoarthritis of both first carpometacarpal joints  M46.92 (ICD-10-CM) - Unspecified inflammatory spondylopathy, cervical region (HCC)  M43.02 (ICD-10-CM) - Spondylolysis of cervical spine    Rationale for Evaluation and Treatment: Rehabilitation  THERAPY DIAG:  Other low back pain  Bilateral hip pain  Muscle weakness (generalized)  Cramp and spasm  ONSET DATE: 3 weeks ago  SUBJECTIVE:                                                                                                                                                                                           SUBJECTIVE STATEMENT: Three weeks ago started getting snapping hip syndrome on L side. Pain moved to groin. Has had chronic pain for years. Got approved yesterday for spinal cord stimulator. Has weakness in B knees. Just started using cane last week. I have a walker for long distance walking. Leaving for 3 weeks for a long trip to [place]. Balance has not been great lately. Gets quick tipsy feel or if I turn around.   PERTINENT HISTORY:  B trochanteric bursitis, B hip THA, B ankle replacements, neck fusion, lumbar laminectomy, CTS B, fibromyalgia, Myoadenylate deaminase deficiency, Long-term issues with chronic pain in the spinal column as well as peripheral joints. Low back pain with significant deconditioning of the lumbar spine muscles   PAIN:  Are you having pain? Yes: NPRS scale: 4 in morning up to 8+/10 Pain location: low back and into legs to knees Pain description: tenderness, numbness Aggravating factors: activity Relieving factors: rest  PRECAUTIONS: None  WEIGHT BEARING RESTRICTIONS: No  FALLS:  Has patient fallen in last 6 months? No  LIVING ENVIRONMENT: Lives with:  lives alone Lives in: House/apartment Stairs: Yes: External: 1 steps; can reach both Has following  equipment at home: Single point cane and Walker - 2 wheeled  OCCUPATION: retired  PLOF: Independent  PATIENT GOALS: decrease pain  NEXT MD VISIT: none scheduled  OBJECTIVE:   DIAGNOSTIC FINDINGS:  MRI LUMBAR 04/25/23 IMPRESSION: 1. Status post posterior decompression from L2-L3 through L4-L5 without significant spinal canal stenosis. Up to moderate right neural foraminal stenosis at L3-L4 and mild-to-moderate right neural foraminal stenosis at L4-L5. These findings are not significantly changed since 2022. 2. S shaped curvature with degenerative endplate edema eccentric to the right at L3-L4, increased since 2022. 3. No acute osseous abnormality.  HIP 04/19/23 IMPRESSION: 1. Total hip arthroplasty changes bilaterally without evidence of acute fracture, dislocation, or hardware loosening. 2. Degenerative changes in the lower lumbar spine.  PATIENT SURVEYS:  FOTO TBD  SCREENING FOR RED FLAGS: Bowel or bladder incontinence: Yes: seeing urologist; back ruled out Spinal tumors: No Cauda equina syndrome: No Compression fracture: No Abdominal aneurysm: No  COGNITION: Overall cognitive status: Within functional limits for tasks assessed     SENSATION: Reports numbness in bil hips/thighs  MUSCLE LENGTH: WNL B  POSTURE:  left ilia higher, R scapula retracted  PALPATION: Marked in bil gluteals, lumbar, quads, ITB, tender in gastroc  LUMBAR ROM:   AROM eval  Flexion 50  Extension 12  Right lateral flexion 5  Left lateral flexion 7  Right rotation 25%  Left rotation 75%   (Blank rows = not tested)  LOWER EXTREMITY MMT:       Right eval Left eval  Hip flexion 4* 4*  Hip extension 4 4  Hip abduction 5 4+  Hip adduction 5 5  Hip internal rotation    Hip external rotation    Knee flexion 4- 4-  Knee extension 4 4+  Ankle dorsiflexion 4+ 5  Ankle  plantarflexion    Ankle inversion    Ankle eversion     (Blank rows = not tested)  LOWER EXTREMITY ROM  WNL  FUNCTIONAL TESTS:  5 times sit to stand: 48 sec intermittent use of hands Dynamic Gait Index: TBD  GAIT: Distance walked: 50 FT Assistive device utilized: Single point cane Level of assistance: Modified independence Comments: antalgic gait  TODAY'S TREATMENT:                                                                                                                              DATE:   05/08/23 See pt ed and HEP  PATIENT EDUCATION:  Education details: PT eval findings, anticipated POC, need for further assessment of balance, initial HEP, and aquatic PT Person educated: Patient Education method: Explanation, Demonstration, and Handouts Education comprehension: verbalized understanding and returned demonstration  HOME EXERCISE PROGRAM: Access Code: [email or web address] URL: [email or web address] Date: 05/08/2023 Prepared by: Raynelle Fanning  Exercises - Supine Bridge  - 2 x daily - 7 x weekly - 1-3 sets - 10 reps - Hooklying Clamshell with Resistance  - 1 x daily - 4 x weekly - 1-3  sets - 10 reps  ASSESSMENT:  CLINICAL IMPRESSION: Patient is a 72 y.o. female who was seen today for physical therapy evaluation and treatment for low back and radicular pain in B knees and hips as well as LE weakness. Her pain is chronic but has worsened in the past three weeks especially in the hips where she has been diagnosed with B trochanteric bursitis. She also reports balance problems. She started walking with a cane this week. She has deficits in lumbar ROM and B LE strength affecting her ability to walk, stand and sleep. She will benefit from skilled PT to address these deficits.    OBJECTIVE IMPAIRMENTS: decreased activity tolerance, decreased balance, difficulty walking, decreased ROM, decreased strength, increased muscle spasms, postural dysfunction, and pain.   ACTIVITY  LIMITATIONS: bending, standing, sleeping, transfers, and locomotion level  PARTICIPATION LIMITATIONS: church  PERSONAL FACTORS: Age, Fitness, Time since onset of injury/illness/exacerbation, and 3+ comorbidities: B trochanteric bursitis, B hip THA, B ankle replacements, neck fusion, lumbar laminectomy, CTS B, fibromyalgia, Myoadenylate deaminase deficiency,   are also affecting patient's functional outcome.   REHAB POTENTIAL: Good  CLINICAL DECISION MAKING: Evolving/moderate complexity  EVALUATION COMPLEXITY: Moderate   GOALS: Goals reviewed with patient? Yes  SHORT TERM GOALS: Target date: 06/19/2023   Patient will be independent with initial HEP.  Baseline:  Goal status: INITIAL  2.  Patient will report decreased hip pain by 25% to improve QOL.  Baseline:  Goal status: INITIAL   LONG TERM GOALS: Target date: 07/31/2023   Patient will be independent with advanced/ongoing HEP to improve outcomes and carryover.  Baseline:  Goal status: INITIAL  2.  Patient will report 50% improvement in low back and LE pain to improve QOL.  Baseline:  Goal status: INITIAL  3.  Patient will be able to stand in church without difficulty. Baseline:  Goal status: INITIAL  4.  Patient will be able to sleep on her side without increased hip pain. Baseline:  Goal status: INITIAL  5.  Patient will demonstrate improved functional strength as demonstrated by improved 5XSTS to 30 sec or less. Baseline: 48 sec Goal status: INITIAL  6.  Patient will report predicted FS score on lumbar FOTO to demonstrate improved functional ability.  Baseline: TBD Goal status: INITIAL   7.  Patient will score > 19 on DGI to decrease fall risk Baseline: TBD Goal status: INITIAL  PLAN:  PT FREQUENCY: 2x/week  PT DURATION: 12 weeks  PLANNED INTERVENTIONS: Therapeutic exercises, Therapeutic activity, Neuromuscular re-education, Balance training, Gait training, Patient/Family education, Self Care, Joint  mobilization, Aquatic Therapy, Dry Needling, Electrical stimulation, Spinal mobilization, Cryotherapy, Moist heat, Taping, Ultrasound, Ionotophoresis 4mg /ml Dexamethasone, and Manual therapy.  PLAN FOR NEXT SESSION: Complete FOTO, DGI, give aquatics h/o, functional strengthening, ionto for hips, DN/MT   Gal Smolinski, PT 05/08/2023, 12:37 PM

## 2023-05-08 ENCOUNTER — Ambulatory Visit: Payer: Medicare Other | Attending: Medical-Surgical | Admitting: Physical Therapy

## 2023-05-08 ENCOUNTER — Encounter: Payer: Self-pay | Admitting: Physical Therapy

## 2023-05-08 DIAGNOSIS — M5416 Radiculopathy, lumbar region: Secondary | ICD-10-CM | POA: Diagnosis not present

## 2023-05-08 DIAGNOSIS — M180 Bilateral primary osteoarthritis of first carpometacarpal joints: Secondary | ICD-10-CM | POA: Insufficient documentation

## 2023-05-08 DIAGNOSIS — M25551 Pain in right hip: Secondary | ICD-10-CM | POA: Insufficient documentation

## 2023-05-08 DIAGNOSIS — M5459 Other low back pain: Secondary | ICD-10-CM | POA: Insufficient documentation

## 2023-05-08 DIAGNOSIS — M6281 Muscle weakness (generalized): Secondary | ICD-10-CM | POA: Diagnosis present

## 2023-05-08 DIAGNOSIS — M4692 Unspecified inflammatory spondylopathy, cervical region: Secondary | ICD-10-CM | POA: Insufficient documentation

## 2023-05-08 DIAGNOSIS — M170 Bilateral primary osteoarthritis of knee: Secondary | ICD-10-CM | POA: Insufficient documentation

## 2023-05-08 DIAGNOSIS — M4302 Spondylolysis, cervical region: Secondary | ICD-10-CM | POA: Diagnosis not present

## 2023-05-08 DIAGNOSIS — R252 Cramp and spasm: Secondary | ICD-10-CM | POA: Diagnosis present

## 2023-05-08 DIAGNOSIS — M25552 Pain in left hip: Secondary | ICD-10-CM | POA: Insufficient documentation

## 2023-05-09 ENCOUNTER — Ambulatory Visit (INDEPENDENT_AMBULATORY_CARE_PROVIDER_SITE_OTHER): Payer: Medicare Other | Admitting: Nurse Practitioner

## 2023-05-09 ENCOUNTER — Encounter: Payer: Self-pay | Admitting: Nurse Practitioner

## 2023-05-09 VITALS — BP 140/76 | HR 76 | Temp 98.0°F | Ht 64.0 in | Wt 177.0 lb

## 2023-05-09 DIAGNOSIS — E669 Obesity, unspecified: Secondary | ICD-10-CM | POA: Diagnosis not present

## 2023-05-09 DIAGNOSIS — E7849 Other hyperlipidemia: Secondary | ICD-10-CM

## 2023-05-09 DIAGNOSIS — R748 Abnormal levels of other serum enzymes: Secondary | ICD-10-CM | POA: Diagnosis not present

## 2023-05-09 DIAGNOSIS — Z6830 Body mass index (BMI) 30.0-30.9, adult: Secondary | ICD-10-CM | POA: Diagnosis not present

## 2023-05-09 NOTE — Therapy (Signed)
OUTPATIENT PHYSICAL THERAPY THORACOLUMBAR EVALUATION   Patient Name: Heather Thompson MRN: [id number] DOB:11-18-1951, 72 y.o., female Today's Date: 05/10/2023  END OF SESSION:  PT End of Session - 05/10/23 1159     Visit Number 2    Date for PT Re-Evaluation 07/31/23    Authorization Type MCR    Progress Note Due on Visit 10    PT Start Time 1159    PT Stop Time 1232    PT Time Calculation (min) 33 min    Activity Tolerance Patient tolerated treatment well    Behavior During Therapy WFL for tasks assessed/performed              Past Medical History:  Diagnosis Date   Allergy    Anemia    Anemia    Anxiety    B12 deficiency    Back pain    Chronic fatigue syndrome    Clotting disorder (HCC)    Constipation    Depression    Depression    Edema of both lower extremities    Fatty liver    Fibromyalgia    Gallbladder problem    High cholesterol    History of blood clots    History of Roux-en-Y gastric bypass    Hypertension    Hypothyroidism    Hypothyroidism    Infertility, female    Joint pain    Kidney stones    Muscle disorder    Muscle disorder    Myoadenylate deaminase deficiency (HCC)    Osteoarthritis    Prediabetes    Salzmann nodular degeneration    SOBOE (shortness of breath on exertion)    Swallowing difficulty    Vitamin D deficiency    Past Surgical History:  Procedure Laterality Date   ABDOMINAL HYSTERECTOMY     APPENDECTOMY     CERVICAL FUSION     CHOLECYSTECTOMY     FOOT SURGERY Bilateral    fusion   GASTRIC BYPASS     TOTAL HIP ARTHROPLASTY Bilateral    11/2016 & 05/2017   Patient Active Problem List   Diagnosis Date Noted   Snapping hip syndrome, 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   Other myositis, left thigh 04/24/2023   Sacroiliitis, not elsewhere classified (HCC) 04/24/2023   Dysphagia 04/17/2023   Elevated liver enzymes 04/02/2023   Elevated TSH 04/02/2023   Chronic throat clearing 03/14/2023   Globus sensation 03/14/2023   Hoarseness  03/14/2023   Muscle tension dysphonia 03/12/2023   Unspecified inflammatory spondylopathy, 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   Primary osteoarthritis of both first carpometacarpal joints 12/21/2021   Primary osteoarthritis of both knees 12/21/2021   Port-A-Cath in place 07/29/2021   Acquired hypothyroidism 07/29/2021   Chronic fatigue 07/29/2021   Vitamin B12 deficiency 07/29/2021   Vitamin D deficiency 07/29/2021   Postmenopausal estrogen deficiency 07/29/2021   IBS (irritable bowel syndrome) 04/06/2021   Retention of urine 04/06/2021   Herniated lumbar intervertebral disc 11/12/2020   Skin sensation disturbance 11/12/2020   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   Spondylopathy, unspecified 11/12/2020   Trochanteric bursitis of both hips 11/19/2019   Low serum iron 04/23/2019   Osteomalacia 04/23/2019   Abnormality of gait 04/23/2019   Presence of right artificial hip joint 06/26/2018   Gastroesophageal reflux disease 06/18/2018   Hypertension 06/18/2018   Intertrigo 04/30/2018   Lentigines 04/30/2018   Hyponatremia 01/17/2018   History of left hip replacement 01/14/2018   History of cervical spinal surgery 01/01/2018   Obesity (BMI 30-39.9) 01/01/2018   SK (  seborrheic keratosis) 04/18/2017   Pain management contract agreement 11/17/2016   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   Allergic rhinitis due to animal hair and dander 12/02/2015   Low back pain 03/03/2015   Incontinence 10/30/2013   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   Neutrophilic leukocytosis 08/12/2012   Thrombocytosis 08/12/2012   Osteoporosis, senile 11/15/2010   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   Hyperlipidemia 08/19/2010   Impaired fasting glucose 08/19/2010   Polyarthritis 09/28/2009   Degenerative disc disease 04/07/2009   Depression with anxiety 04/07/2009   Myopathy 04/07/2009   Insomnia 04/07/2009   Migraine 04/07/2009   Scoliosis 04/07/2009   Seasonal allergies 04/07/2009   Vitamin B12 deficiency anemia  04/07/2009    PCP: Christen Butter, NP   REFERRING PROVIDER: Christen Butter, NP   REFERRING DIAG:  54.16 (ICD-10-CM) - Lumbar radiculopathy  M17.0 (ICD-10-CM) - Primary osteoarthritis of both knees  M18.0 (ICD-10-CM) - Primary osteoarthritis of both first carpometacarpal joints  M46.92 (ICD-10-CM) - Unspecified inflammatory spondylopathy, cervical region (HCC)  M43.02 (ICD-10-CM) - Spondylolysis of cervical spine    Rationale for Evaluation and Treatment: Rehabilitation  THERAPY DIAG:  Other low back pain  Bilateral hip pain  Muscle weakness (generalized)  Cramp and spasm  ONSET DATE: 3 weeks ago  SUBJECTIVE:                                                                                                                                                                                           SUBJECTIVE STATEMENT: New MRI for thoracic scheduled so she can get the stimulator afterwards.  PERTINENT HISTORY:  B trochanteric bursitis, B hip THA, B ankle replacements, neck fusion, lumbar laminectomy, CTS B, fibromyalgia, Myoadenylate deaminase deficiency, Long-term issues with chronic pain in the spinal column as well as peripheral joints. Low back pain with significant deconditioning of the lumbar spine muscles   PAIN:  Are you having pain? Yes: NPRS scale: 4 in morning up to 8+/10 Pain location: low back and into legs to knees Pain description: tenderness, numbness Aggravating factors: activity Relieving factors: rest  PRECAUTIONS: None  WEIGHT BEARING RESTRICTIONS: No  FALLS:  Has patient fallen in last 6 months? No  LIVING ENVIRONMENT: Lives with: lives alone Lives in: House/apartment Stairs: Yes: External: 1 steps; can reach both Has following equipment at home: Single point cane and Walker - 2 wheeled  OCCUPATION: retired  PLOF: Independent  PATIENT GOALS: decrease pain  NEXT MD VISIT: none scheduled  OBJECTIVE:   DIAGNOSTIC FINDINGS:  MRI LUMBAR 04/25/23  IMPRESSION: 1. Status post posterior decompression from L2-L3 through L4-L5 without significant spinal canal stenosis. Up to moderate right neural foraminal stenosis at  L3-L4 and mild-to-moderate right neural foraminal stenosis at L4-L5. These findings are not significantly changed since 2022. 2. S shaped curvature with degenerative endplate edema eccentric to the right at L3-L4, increased since 2022. 3. No acute osseous abnormality.  HIP 04/19/23 IMPRESSION: 1. Total hip arthroplasty changes bilaterally without evidence of acute fracture, dislocation, or hardware loosening. 2. Degenerative changes in the lower lumbar spine.  PATIENT SURVEYS:  FOTO TBD  SCREENING FOR RED FLAGS: Bowel or bladder incontinence: Yes: seeing urologist; back ruled out Spinal tumors: No Cauda equina syndrome: No Compression fracture: No Abdominal aneurysm: No  COGNITION: Overall cognitive status: Within functional limits for tasks assessed     SENSATION: Reports numbness in bil hips/thighs  MUSCLE LENGTH: WNL B  POSTURE:  left ilia higher, R scapula retracted  PALPATION: Marked in bil gluteals, lumbar, quads, ITB, tender in gastroc  LUMBAR ROM:   AROM eval  Flexion 50  Extension 12  Right lateral flexion 5  Left lateral flexion 7  Right rotation 25%  Left rotation 75%   (Blank rows = not tested)  LOWER EXTREMITY MMT:       Right eval Left eval  Hip flexion 4* 4*  Hip extension 4 4  Hip abduction 5 4+  Hip adduction 5 5  Hip internal rotation    Hip external rotation    Knee flexion 4- 4-  Knee extension 4 4+  Ankle dorsiflexion 4+ 5  Ankle plantarflexion    Ankle inversion    Ankle eversion     (Blank rows = not tested)  LOWER EXTREMITY ROM  WNL  FUNCTIONAL TESTS:  5 times sit to stand: 48 sec intermittent use of hands Berg Balance Scale: 50/56 Dynamic Gait Index: 18 SLS < 2 sec B   Feet together on Foam EO - 20 sec GAIT: Distance walked: 50 FT Assistive device  utilized: Single point cane Level of assistance: Modified independence Comments: antalgic gait  TODAY'S TREATMENT:                                                                                                                              DATE:   05/10/23 Nustep L6 x 5.5 min Therapeutic Activities:  FOTO,DGI, BERG, SLS, partial MCTSIB   05/08/23 See pt ed and HEP  PATIENT EDUCATION:  Education details: PT eval findings, anticipated POC, need for further assessment of balance, initial HEP, and aquatic PT Person educated: Patient Education method: Explanation, Demonstration, and Handouts Education comprehension: verbalized understanding and returned demonstration  HOME EXERCISE PROGRAM: Access Code: [email or web address] URL: [email or web address] Date: 05/08/2023 Prepared by: Raynelle Fanning  Exercises - Supine Bridge  - 2 x daily - 7 x weekly - 1-3 sets - 10 reps - Hooklying Clamshell with Resistance  - 1 x daily - 4 x weekly - 1-3 sets - 10 reps  ASSESSMENT:  CLINICAL IMPRESSION: Norvelle arrived 14 min late today, so session focused on remaining assessment of balance and FOTO. Her scores  on the BERG 50/56 and the DGI indicate that she is at moderate risk for falls. HEP was progressed with semi-tandem static stance and sit to stands. Patient refused h/o. She will be on vacation for 3 weeks and then return to PT.   OBJECTIVE IMPAIRMENTS: decreased activity tolerance, decreased balance, difficulty walking, decreased ROM, decreased strength, increased muscle spasms, postural dysfunction, and pain.   ACTIVITY LIMITATIONS: bending, standing, sleeping, transfers, and locomotion level  PARTICIPATION LIMITATIONS: church  PERSONAL FACTORS: Age, Fitness, Time since onset of injury/illness/exacerbation, and 3+ comorbidities: B trochanteric bursitis, B hip THA, B ankle replacements, neck fusion, lumbar laminectomy, CTS B, fibromyalgia, Myoadenylate deaminase deficiency,   are also affecting  patient's functional outcome.   REHAB POTENTIAL: Good  CLINICAL DECISION MAKING: Evolving/moderate complexity  EVALUATION COMPLEXITY: Moderate   GOALS: Goals reviewed with patient? Yes  SHORT TERM GOALS: Target date: 06/19/2023   Patient will be independent with initial HEP.  Baseline:  Goal status: INITIAL  2.  Patient will report decreased hip pain by 25% to improve QOL.  Baseline:  Goal status: INITIAL   LONG TERM GOALS: Target date: 07/31/2023   Patient will be independent with advanced/ongoing HEP to improve outcomes and carryover.  Baseline:  Goal status: INITIAL  2.  Patient will report 50% improvement in low back and LE pain to improve QOL.  Baseline:  Goal status: INITIAL  3.  Patient will be able to stand in church without difficulty. Baseline:  Goal status: INITIAL  4.  Patient will be able to sleep on her side without increased hip pain. Baseline:  Goal status: INITIAL  5.  Patient will demonstrate improved functional strength as demonstrated by improved 5XSTS to 30 sec or less. Baseline: 48 sec Goal status: INITIAL  6.  Patient will report predicted FS score (45) on lumbar FOTO to demonstrate improved functional ability.  Baseline: 40 Goal status: INITIAL   7.  Patient will score > 19 on DGI to decrease fall risk Baseline: 18 Goal status: INITIAL  PLAN:  PT FREQUENCY: 2x/week  PT DURATION: 12 weeks  PLANNED INTERVENTIONS: Therapeutic exercises, Therapeutic activity, Neuromuscular re-education, Balance training, Gait training, Patient/Family education, Self Care, Joint mobilization, Aquatic Therapy, Dry Needling, Electrical stimulation, Spinal mobilization, Cryotherapy, Moist heat, Taping, Ultrasound, Ionotophoresis 4mg /ml Dexamethasone, and Manual therapy.  PLAN FOR NEXT SESSION:  Assess MCTSIB, functional strengthening, ionto for hips, DN/MT, balance  Solon Palm, PT 05/10/23 12:53 PM

## 2023-05-09 NOTE — Progress Notes (Signed)
Office: [phone]  /  Fax: [phone]  WEIGHT SUMMARY AND BIOMETRICS  No data recorded Weight Gained Since Last Visit: 1lb   Vitals Temp: 98 F (36.7 C) BP: (!) 140/76 Pulse Rate: 76 SpO2: 97 %   Anthropometric Measurements Height: 5\' 4"  (1.626 m) Weight: 177 lb (80.3 kg) BMI (Calculated): 30.37 Weight at Last Visit: 176lb Weight Gained Since Last Visit: 1lb Starting Weight: 178ln Total Weight Loss (lbs): 1 lb (0.454 kg)   Body Composition  Body Fat %: 46.6 % Fat Mass (lbs): 82.4 lbs Muscle Mass (lbs): 89.8 lbs Total Body Water (lbs): 73 lbs Visceral Fat Rating : 13   Other Clinical Data Fasting: No Labs: No Today's Visit #: 3 Starting Date: 03/27/23     HPI  Chief Complaint: OBESITY  [name] is here to discuss her progress with her obesity treatment plan. She is on the the Category 2 Plan and states she is following her eating plan approximately 50 % of the time. She states she is exercising 0 minutes 0 days per week.   Interval History:  Since last office visit she has gained 1 pound. She is planning to have a spinal cord stimulated placed sometime in August.  She is struggling with eating and not eating due to nausea.  Today she had a protein shake and soup. She doesn't feel that she can eat anymore today.  This is a typical food intake day for her.  She will try to snack on sugar free jello, sugar free pudding, fruit pops, protein shake, yogurt, cheese sticks and then has to force herself to eat.   She knows that she is not eating enough.  She has seen GI and has had testing.  She saw speech language pathology today and plans to see another GI soon for a second opinion.  Last EGD was 12/20/22.     Pharmacotherapy for weight loss: She is not currently taking medications  for medical weight loss.      Previous pharmacotherapy for medical weight loss:  has not tried weight loss meds in the past   Bariatric surgery:  Patient had gastric bypass 27 years  ago.  Her highest weight was 267 lbs and nadir weight was 154 lbs. Her weight has fluctuated over the years.  Highest weight since having surgery was 216 lbs.    Hyperlipidemia Medication(s): Pravastatin 20mg . Denies side effects.   Cardiovascular risk factors: advanced age (older than 42 for men, 66 for women), dyslipidemia, hypertension, obesity (BMI >= 30 kg/m2), and sedentary lifestyle  Lab Results  Component Value Date   CHOL 213 (H) 02/20/2023   HDL 89 02/20/2023   LDLCALC 104 (H) 02/20/2023   TRIG 106 02/20/2023   CHOLHDL 2.4 02/20/2023   Lab Results  Component Value Date   ALT 38 (H) 04/10/2023   AST 29 04/10/2023   ALKPHOS 98 04/10/2023   BILITOT 0.4 04/10/2023   The 10-year ASCVD risk score (Arnett DK, et al., 2019) is: 17.6%   Values used to calculate the score:     Age: 13 years     Sex: Female     Is Non-Hispanic African American: No     Diabetic: No     Tobacco smoker: No     Systolic Blood Pressure: 140 mmHg     Is BP treated: Yes     HDL Cholesterol: 89 mg/dL     Total Cholesterol: 213 mg/dL   PHYSICAL EXAM:  Blood pressure (!) 140/76, pulse 76, temperature  98 F (36.7 C), height 5\' 4"  (1.626 m), weight 177 lb (80.3 kg), SpO2 97 %. Body mass index is 30.38 kg/m.  General: She is overweight, cooperative, alert, well developed, and in no acute distress. PSYCH: Has normal mood, affect and thought process.   Extremities: No edema.  Neurologic: No gross sensory or motor deficits. No tremors or fasciculations noted.    DIAGNOSTIC DATA REVIEWED:  BMET    Component Value Date/Time   NA 137 03/27/2023 0957   K 4.2 03/27/2023 0957   CL 95 (L) 03/27/2023 0957   CO2 23 03/27/2023 0957   GLUCOSE 97 03/27/2023 0957   GLUCOSE 92 02/20/2023 1128   BUN 11 03/27/2023 0957   CREATININE 0.68 03/27/2023 0957   CREATININE 0.80 02/20/2023 1128   CALCIUM 9.8 03/27/2023 0957   GFRNONAA >60 09/07/2021 1433   Lab Results  Component Value Date   HGBA1C 6.2 (H)  04/10/2023   HGBA1C 6.0 (H) 04/06/2022   Lab Results  Component Value Date   INSULIN 8.7 03/27/2023   Lab Results  Component Value Date   TSH 4.550 (H) 03/27/2023   CBC    Component Value Date/Time   WBC 5.2 02/14/2023 1352   WBC 4.7 12/19/2022 1157   RBC 3.99 02/14/2023 1352   RBC 3.97 02/14/2023 1351   HGB 12.0 02/14/2023 1352   HCT 36.7 02/14/2023 1352   PLT 200 02/14/2023 1352   MCV 92.0 02/14/2023 1352   MCH 30.1 02/14/2023 1352   MCHC 32.7 02/14/2023 1352   RDW 13.0 02/14/2023 1352   Iron Studies    Component Value Date/Time   IRON 93 02/14/2023 1352   TIBC 392 02/14/2023 1352   FERRITIN 123 02/14/2023 1352   IRONPCTSAT 24 02/14/2023 1352   IRONPCTSAT 25 12/19/2022 1157   Lipid Panel     Component Value Date/Time   CHOL 213 (H) 02/20/2023 1128   TRIG 106 02/20/2023 1128   HDL 89 02/20/2023 1128   CHOLHDL 2.4 02/20/2023 1128   LDLCALC 104 (H) 02/20/2023 1128   Hepatic Function Panel     Component Value Date/Time   PROT 7.2 04/10/2023 1624   ALBUMIN 4.6 04/10/2023 1624   AST 29 04/10/2023 1624   AST 30 09/07/2021 1433   ALT 38 (H) 04/10/2023 1624   ALT 31 09/07/2021 1433   ALKPHOS 98 04/10/2023 1624   BILITOT 0.4 04/10/2023 1624   BILITOT 0.3 09/07/2021 1433   BILIDIR 0.12 04/10/2023 1624      Component Value Date/Time   TSH 4.550 (H) 03/27/2023 0957   Nutritional Lab Results  Component Value Date   VD25OH 46.8 03/27/2023   VD25OH 78 07/10/2022     ASSESSMENT AND PLAN  TREATMENT PLAN FOR OBESITY:  Recommended Dietary Goals  Heather Thompson is currently in the action stage of change. As such, her goal is to continue weight management plan. She has agreed to the Category 2 Plan.  Behavioral Intervention  We discussed the following Behavioral Modification Strategies today: increasing lean protein intake, decreasing simple carbohydrates , increasing vegetables, increasing lower glycemic fruits, increasing water intake, work on meal planning and  preparation, reading food labels , keeping healthy foods at home, continue to practice mindfulness when eating, and planning for success.  Additional resources provided today: multiple   Recommended Physical Activity Goals  Shantall has been advised to work up to 150 minutes of moderate intensity aerobic activity a week and strengthening exercises 2-3 times per week for cardiovascular health, weight loss maintenance and  preservation of muscle mass.   She has agreed to Think about ways to increase daily physical activity and overcoming barriers to exercise and Increase physical activity in their day and reduce sedentary time (increase NEAT).   Pharmacotherapy We discussed various medication options to help [facility] with her weight loss efforts and we both agreed to that she is not a candidate for a weight loss medication due to her significant limited calories and protein intake. She asked me about [facility] today and I will not prescribe it for her and explained extensively why I will not prescribe it.      ASSOCIATED CONDITIONS ADDRESSED TODAY  Action/Plan  Elevated liver enzymes Last labs looked better.  Waiting for appt with GI  Other hyperlipidemia Continue to follow up with PCP. Continue meds as directed.    Generalized obesity  BMI 30.0-30.9,adult     Labs reviewed in chart with patient from 04/10/23. Patient is waiting for appt with GI  She has an appt with her PCP in July.  Please keep follow up appt   Return in about 4 weeks (around 06/06/2023).Marland Kitchen She was informed of the importance of frequent follow up visits to maximize her success with intensive lifestyle modifications for her multiple health conditions.   ATTESTASTION STATEMENTS:  Reviewed by clinician on day of visit: allergies, medications, problem list, medical history, surgical history, family history, social history, and previous encounter notes.   Time spent on visit including pre-visit chart review and post-visit care  and charting was 30 minutes.    Theodis Sato. Sharian Delia FNP-C

## 2023-05-10 ENCOUNTER — Telehealth: Payer: Self-pay | Admitting: Medical-Surgical

## 2023-05-10 ENCOUNTER — Encounter: Payer: Self-pay | Admitting: Physical Therapy

## 2023-05-10 ENCOUNTER — Other Ambulatory Visit: Payer: Self-pay | Admitting: Medical-Surgical

## 2023-05-10 ENCOUNTER — Ambulatory Visit: Payer: Medicare Other | Admitting: Physical Therapy

## 2023-05-10 DIAGNOSIS — G4700 Insomnia, unspecified: Secondary | ICD-10-CM

## 2023-05-10 DIAGNOSIS — M5459 Other low back pain: Secondary | ICD-10-CM | POA: Diagnosis not present

## 2023-05-10 DIAGNOSIS — M6281 Muscle weakness (generalized): Secondary | ICD-10-CM

## 2023-05-10 DIAGNOSIS — M25551 Pain in right hip: Secondary | ICD-10-CM

## 2023-05-10 DIAGNOSIS — R252 Cramp and spasm: Secondary | ICD-10-CM

## 2023-05-10 NOTE — Telephone Encounter (Signed)
Please contact patient's pharmacy.  Okay to give verbal order to fill temazepam early so that she can go out of town. ___________________________________________ Thayer Ohm, DNP, APRN, FNP-BC Primary Care and Sports Medicine [facility] [place]

## 2023-05-10 NOTE — Telephone Encounter (Signed)
Patient called requesting a refill of temazepam (RESTORIL) 22.5 MG capsule [[id number]] early. The pharmacy said they wold refill it early if there is an explanation (patient leaving town) by the doctor.  Patient is leaving town in two days and would like the medication before then.  Pharmacy ;  [facility]

## 2023-05-10 NOTE — Telephone Encounter (Signed)
Contacted Heather Thompson to give the verbal authorization to fill Temazepam early.

## 2023-05-10 NOTE — Telephone Encounter (Signed)
Refill request was for the 30mg  dose so was denied. We are currently on the 22.5mg  dose and there is already a refill at her pharmacy.

## 2023-05-11 ENCOUNTER — Other Ambulatory Visit: Payer: Self-pay | Admitting: Medical-Surgical

## 2023-05-11 ENCOUNTER — Telehealth: Payer: Self-pay | Admitting: Physician Assistant

## 2023-05-11 DIAGNOSIS — G4700 Insomnia, unspecified: Secondary | ICD-10-CM

## 2023-05-11 MED ORDER — TIZANIDINE HCL 2 MG PO TABS: 2.0000 mg | ORAL_TABLET | Freq: Two times a day (BID) | ORAL | 0 refills | Status: DC

## 2023-05-11 MED ORDER — TEMAZEPAM 22.5 MG PO CAPS: ORAL_CAPSULE | ORAL | 0 refills | Status: DC

## 2023-05-11 MED ORDER — TEMAZEPAM 22.5 MG PO CAPS
ORAL_CAPSULE | ORAL | 0 refills | Status: DC
Start: 2023-05-11 — End: 2023-05-11

## 2023-05-11 NOTE — Telephone Encounter (Signed)
Refill request refused because this is the incorrect dose of temazepam.  There is a refill of temazepam 22.5 mg already on file that was sent previously and has been okayed for early pickup.  If she does not want to pick it up from where it was originally sent, we will need to cancel that prescription there and then be sent over to [facility] pharmacy.

## 2023-05-11 NOTE — Telephone Encounter (Signed)
Patient called she would like her tizandine 2mg  please resubmit to [facility] in [place] [phone]

## 2023-05-11 NOTE — Telephone Encounter (Signed)
Pharmacy called requesting an order for temazepam 22.5 mg provider is not available Please submit to [facility] [place]   [phone]

## 2023-06-01 ENCOUNTER — Other Ambulatory Visit: Payer: Self-pay | Admitting: Medical-Surgical

## 2023-06-01 ENCOUNTER — Other Ambulatory Visit (HOSPITAL_COMMUNITY): Payer: Self-pay | Admitting: Psychiatry

## 2023-06-04 ENCOUNTER — Inpatient Hospital Stay: Payer: Medicare Other | Attending: Hematology & Oncology

## 2023-06-04 ENCOUNTER — Ambulatory Visit (INDEPENDENT_AMBULATORY_CARE_PROVIDER_SITE_OTHER): Payer: Medicare Other | Admitting: Nurse Practitioner

## 2023-06-04 ENCOUNTER — Encounter: Payer: Self-pay | Admitting: Nurse Practitioner

## 2023-06-04 ENCOUNTER — Ambulatory Visit (INDEPENDENT_AMBULATORY_CARE_PROVIDER_SITE_OTHER): Payer: Medicare Other | Admitting: Medical-Surgical

## 2023-06-04 VITALS — BP 128/53 | HR 68 | Ht 64.0 in | Wt 177.0 lb

## 2023-06-04 VITALS — BP 145/80 | HR 68 | Temp 98.4°F | Ht 64.0 in | Wt 177.0 lb

## 2023-06-04 VITALS — BP 146/60 | HR 65 | Temp 98.4°F | Resp 19

## 2023-06-04 DIAGNOSIS — Z6830 Body mass index (BMI) 30.0-30.9, adult: Secondary | ICD-10-CM

## 2023-06-04 DIAGNOSIS — Z95828 Presence of other vascular implants and grafts: Secondary | ICD-10-CM

## 2023-06-04 DIAGNOSIS — I10 Essential (primary) hypertension: Secondary | ICD-10-CM | POA: Diagnosis not present

## 2023-06-04 DIAGNOSIS — D508 Other iron deficiency anemias: Secondary | ICD-10-CM | POA: Insufficient documentation

## 2023-06-04 DIAGNOSIS — Z452 Encounter for adjustment and management of vascular access device: Secondary | ICD-10-CM | POA: Diagnosis present

## 2023-06-04 DIAGNOSIS — E538 Deficiency of other specified B group vitamins: Secondary | ICD-10-CM | POA: Diagnosis not present

## 2023-06-04 DIAGNOSIS — K912 Postsurgical malabsorption, not elsewhere classified: Secondary | ICD-10-CM | POA: Insufficient documentation

## 2023-06-04 DIAGNOSIS — E669 Obesity, unspecified: Secondary | ICD-10-CM | POA: Diagnosis not present

## 2023-06-04 MED ORDER — HEPARIN SOD (PORK) LOCK FLUSH 100 UNIT/ML IV SOLN
500.0000 [IU] | Freq: Once | INTRAVENOUS | Status: AC
  Administered 2023-06-04: 500 [IU] via INTRAVENOUS

## 2023-06-04 MED ORDER — SODIUM CHLORIDE 0.9% FLUSH
10.0000 mL | Freq: Once | INTRAVENOUS | Status: AC
  Administered 2023-06-04: 10 mL via INTRAVENOUS

## 2023-06-04 MED ORDER — CYANOCOBALAMIN 1000 MCG/ML IJ SOLN
1000.00 ug | Freq: Once | INTRAMUSCULAR | Status: AC
Start: 2023-06-04 — End: 2023-06-04
  Administered 2023-06-04: 1000 ug via INTRAMUSCULAR

## 2023-06-04 NOTE — Progress Notes (Signed)
Pt is here today for vitamin B-12 injection due to B-12 deficiency. Pt denies cramps, weakness, or irregular heart rate.   Injection was given in RD ant pt tolerated well.   Pt will return in about 4 weeks for another B-12 injection.

## 2023-06-04 NOTE — Progress Notes (Signed)
Medical screening examination/treatment was performed by qualified clinical staff member and as supervising provider I was immediately available for consultation/collaboration. I have reviewed documentation and agree with assessment and plan. ° °Jadin Kagel L. Sabian Kuba, DNP, APRN, FNP-BC °[facility] °Primary Care and Sports Medicine ° °

## 2023-06-04 NOTE — Progress Notes (Signed)
Office: [phone]  /  Fax: [phone]  WEIGHT SUMMARY AND BIOMETRICS  Weight Lost Since Last Visit: 0lb  Weight Gained Since Last Visit: 0lb   Vitals Temp: 98.4 F (36.9 C) BP: (!) 145/80 Pulse Rate: 68 SpO2: 97 %   Anthropometric Measurements Height: 5\' 4"  (1.626 m) Weight: 177 lb (80.3 kg) BMI (Calculated): 30.37 Weight at Last Visit: 177lb Weight Lost Since Last Visit: 0lb Weight Gained Since Last Visit: 0lb Starting Weight: 178lb Total Weight Loss (lbs): 1 lb (0.454 kg)   Body Composition  Body Fat %: 44.8 % Fat Mass (lbs): 79.2 lbs Muscle Mass (lbs): 92.8 lbs Total Body Water (lbs): 74 lbs Visceral Fat Rating : 13   Other Clinical Data Fasting: No Labs: No Today's Visit #: 4 Starting Date: 03/27/23     HPI  Chief Complaint: OBESITY  Heather Thompson is here to discuss her progress with her obesity treatment plan. She is on the the Category 2 Plan and states she is following her eating plan approximately 50 % of the time. She states she is exercising as tolerated 7 days per week.   Interval History:  Since last office visit she has maintained her weight.  She continues to struggle with eating and not eating due to nausea. She will try to snack on sugar free jello, sugar free pudding, fruit pops, protein shake, yogurt, cheese sticks.  She is scheduled to see GI November 5th.  She is scheduled to see a swallowing specialist tomorrow and next week seeing the speech therapist.  She is having a MRI tomorrow and psych eval next week for spinal cord stimulator.    Today food recall: 2 protein shakes and 3 cheese sticks   Pharmacotherapy for weight loss: She is not currently taking medications  for medical weight loss.   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  Her highest weight was 267 lbs and nadir weight was 154 lbs. Her weight has fluctuated over the years.  Highest weight since  having surgery was 216 lbs.  Hypertension Hypertension needs improvement.  Medication(s): Lisinopril HCT, propranolol  Denies chest pain, palpitations and SOB.  BP Readings from Last 3 Encounters:  06/04/23 (!) 145/80  06/04/23 (!) 128/53  06/04/23 (!) 146/60   Lab Results  Component Value Date   CREATININE 0.68 03/27/2023   CREATININE 0.80 02/20/2023   CREATININE 0.69 12/19/2022       PHYSICAL EXAM:  Blood pressure (!) 145/80, pulse 68, temperature 98.4 F (36.9 C), height 5\' 4"  (1.626 m), weight 177 lb (80.3 kg), SpO2 97 %. Body mass index is 30.38 kg/m.  General: She is overweight, cooperative, alert, well developed, and in no acute distress. PSYCH: Has normal mood, affect and thought process.   Extremities: No edema.  Neurologic: No gross sensory or motor deficits. No tremors or fasciculations noted.    DIAGNOSTIC DATA REVIEWED:  BMET    Component Value Date/Time   NA 137 03/27/2023 0957   K 4.2 03/27/2023 0957   CL 95 (L) 03/27/2023 0957   CO2 23 03/27/2023 0957   GLUCOSE 97 03/27/2023 0957   GLUCOSE 92 02/20/2023 1128   BUN 11 03/27/2023 0957   CREATININE 0.68 03/27/2023 0957   CREATININE 0.80 02/20/2023 1128   CALCIUM 9.8 03/27/2023 0957   GFRNONAA >60 09/07/2021 1433   Lab Results  Component Value Date   HGBA1C 6.2 (H) 04/10/2023   HGBA1C 6.0 (H) 04/06/2022  Lab Results  Component Value Date   INSULIN 8.7 03/27/2023   Lab Results  Component Value Date   TSH 4.550 (H) 03/27/2023   CBC    Component Value Date/Time   WBC 5.2 02/14/2023 1352   WBC 4.7 12/19/2022 1157   RBC 3.99 02/14/2023 1352   RBC 3.97 02/14/2023 1351   HGB 12.0 02/14/2023 1352   HCT 36.7 02/14/2023 1352   PLT 200 02/14/2023 1352   MCV 92.0 02/14/2023 1352   MCH 30.1 02/14/2023 1352   MCHC 32.7 02/14/2023 1352   RDW 13.0 02/14/2023 1352   Iron Studies    Component Value Date/Time   IRON 93 02/14/2023 1352   TIBC 392 02/14/2023 1352   FERRITIN 123 02/14/2023 1352    IRONPCTSAT 24 02/14/2023 1352   IRONPCTSAT 25 12/19/2022 1157   Lipid Panel     Component Value Date/Time   CHOL 213 (H) 02/20/2023 1128   TRIG 106 02/20/2023 1128   HDL 89 02/20/2023 1128   CHOLHDL 2.4 02/20/2023 1128   LDLCALC 104 (H) 02/20/2023 1128   Hepatic Function Panel     Component Value Date/Time   PROT 7.2 04/10/2023 1624   ALBUMIN 4.6 04/10/2023 1624   AST 29 04/10/2023 1624   AST 30 09/07/2021 1433   ALT 38 (H) 04/10/2023 1624   ALT 31 09/07/2021 1433   ALKPHOS 98 04/10/2023 1624   BILITOT 0.4 04/10/2023 1624   BILITOT 0.3 09/07/2021 1433   BILIDIR 0.12 04/10/2023 1624      Component Value Date/Time   TSH 4.550 (H) 03/27/2023 0957   Nutritional Lab Results  Component Value Date   VD25OH 46.8 03/27/2023   VD25OH 78 07/10/2022     ASSESSMENT AND PLAN  TREATMENT PLAN FOR OBESITY:  Recommended Dietary Goals  Heather Thompson is currently in the action stage of change. As such, her goal is to continue weight management plan. She has agreed to the Category 2 Plan.  Behavioral Intervention  We discussed the following Behavioral Modification Strategies today: increasing lean protein intake, decreasing simple carbohydrates , increasing vegetables, increasing lower glycemic fruits, increasing fiber rich foods, avoiding skipping meals, increasing water intake, keeping healthy foods at home, work on managing stress, creating time for self-care and relaxation measures, continue to practice mindfulness when eating, and planning for success.  Additional resources provided today: NA  Recommended Physical Activity Goals  Heather Thompson has been advised to work up to 150 minutes of moderate intensity aerobic activity a week and strengthening exercises 2-3 times per week for cardiovascular health, weight loss maintenance and preservation of muscle mass.   She has agreed to Continue current level of physical activity  and Think about ways to increase daily physical activity and  overcoming barriers to exercise   ASSOCIATED CONDITIONS ADDRESSED TODAY  Action/Plan  Primary hypertension Follow up with PCP.  Continue meds as directed.    Generalized obesity  BMI 30.0-30.9,adult      Keep schedled follow up appts   Return in about 6 weeks (around 07/16/2023).Marland Kitchen She was informed of the importance of frequent follow up visits to maximize her success with intensive lifestyle modifications for her multiple health conditions.   ATTESTASTION STATEMENTS:  Reviewed by clinician on day of visit: allergies, medications, problem list, medical history, surgical history, family history, social history, and previous encounter notes.   Time spent on visit including pre-visit chart review and post-visit care and charting was 30 minutes.    Theodis Sato. Indiyah Paone FNP-C

## 2023-06-05 ENCOUNTER — Telehealth (INDEPENDENT_AMBULATORY_CARE_PROVIDER_SITE_OTHER): Payer: Self-pay | Admitting: Nurse Practitioner

## 2023-06-05 NOTE — Telephone Encounter (Signed)
She stated she is not eating proper and can't eat due to her recent surgery.  The swallowing therapist suggested anti-nausea medication.She is not sure if she needs to speak with her PCP or [facility] about this situation.I suggested for her to speak with her PCP.

## 2023-06-06 ENCOUNTER — Telehealth: Payer: Self-pay | Admitting: Medical-Surgical

## 2023-06-06 NOTE — Telephone Encounter (Signed)
Pt called.  Nutritionist is recommending that pcp prescribe an anti-nausea medication because she is not able to eat enough since having her gastric bi-pass. She is allergic to Compazine.

## 2023-06-06 NOTE — Telephone Encounter (Signed)
Patient advised.

## 2023-06-11 ENCOUNTER — Ambulatory Visit: Payer: Medicare Other | Admitting: Sports Medicine

## 2023-06-14 ENCOUNTER — Ambulatory Visit: Payer: Medicare Other | Admitting: Sports Medicine

## 2023-06-15 ENCOUNTER — Encounter: Payer: Self-pay | Admitting: Medical-Surgical

## 2023-06-15 ENCOUNTER — Other Ambulatory Visit: Payer: Self-pay | Admitting: Medical-Surgical

## 2023-06-15 ENCOUNTER — Ambulatory Visit (INDEPENDENT_AMBULATORY_CARE_PROVIDER_SITE_OTHER): Payer: Medicare Other | Admitting: Medical-Surgical

## 2023-06-15 VITALS — BP 148/78 | HR 82 | Ht 64.0 in | Wt 178.0 lb

## 2023-06-15 DIAGNOSIS — R110 Nausea: Secondary | ICD-10-CM | POA: Diagnosis not present

## 2023-06-15 MED ORDER — ONDANSETRON 8 MG PO TBDP: 8.0000 mg | ORAL_TABLET | Freq: Three times a day (TID) | ORAL | 3 refills | Status: DC | PRN

## 2023-06-15 MED ORDER — SUCRALFATE 1 G PO TABS: 1.0000 g | ORAL_TABLET | Freq: Three times a day (TID) | ORAL | 0 refills | Status: DC

## 2023-06-15 NOTE — Progress Notes (Signed)
        Established patient visit  History, exam, impression, and plan:  1. Nausea Plan 72 year old female presenting with a history of gastric bypass approximately 20 years ago and complaints of severe nausea that limits her ability to obtain adequate nutrition.  Notes that she previously lived to eat and was able to eat a full plate of food however recently, she has had difficulty getting more than a few bites down at each meal.  She is currently working with healthy weight and wellness on weight loss efforts however has been advised that she is not eating enough calories.  She is focusing on a high-protein diet but her inability to tolerate larger meals has been a hindrance.  Notes that after a few bites, she has feelings of early satiety and very quickly develops nausea.  She has had no vomiting but does have abdominal discomfort.  She is currently on medications to help treat reflux as this was thought to be contributing to her difficulty with throat clearing.  Under the care of speech therapy and has a gastroenterology appointment scheduled for November.  Her nutritionist has recommended we provide her with a medication to help manage her nausea.  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  We discussed various options and we will plan to proceed with Zofran 3 times daily as needed.  Per her report, there are times her esophagus feels irritated and she wonders if the throat clearing is coming from that.  Plan add Carafate 1 g 4 times a day for a few weeks to see if this is helpful with her symptoms.   Procedures performed this visit: None.  Return in about 3 months (around 09/15/2023) for chronic disease follow up.  __________________________________ Thayer Ohm, DNP, APRN, FNP-BC Primary Care and Sports Medicine [facility] [place]

## 2023-06-17 ENCOUNTER — Other Ambulatory Visit: Payer: Self-pay | Admitting: Medical-Surgical

## 2023-06-18 ENCOUNTER — Telehealth: Payer: Self-pay | Admitting: Medical-Surgical

## 2023-06-18 ENCOUNTER — Other Ambulatory Visit: Payer: Self-pay | Admitting: Medical-Surgical

## 2023-06-18 DIAGNOSIS — G4700 Insomnia, unspecified: Secondary | ICD-10-CM

## 2023-06-18 MED ORDER — TEMAZEPAM 22.5 MG PO CAPS
ORAL_CAPSULE | ORAL | 3 refills | Status: DC
Start: 2023-06-18 — End: 2023-10-11

## 2023-06-18 NOTE — Telephone Encounter (Signed)
Pt called the office requesting that Rx temazepam (RESTORIL) 22.5 MG capsule [facility]. Pt would like Rx sent to [facility] pharmacy. Pt states she has not picked up Rx due to error. Please contact patient back at [phone].

## 2023-06-18 NOTE — Telephone Encounter (Signed)
Temazepam refills have been sent to [facility] pharmacy as requested.  [facility] [place] to cancel temazepam prescriptions that are on file. ___________________________________________ Thayer Ohm, DNP, APRN, FNP-BC Primary Care and Sports Medicine [facility] [place]

## 2023-06-20 NOTE — Telephone Encounter (Signed)
Patient called back today states she needs a PA for her temazepam 22.5mg  please advise

## 2023-06-21 ENCOUNTER — Telehealth: Payer: Self-pay

## 2023-06-21 NOTE — Telephone Encounter (Signed)
Patient called back today states she needs a PA for her temazepam 22.5mg  please advise

## 2023-06-21 NOTE — Telephone Encounter (Signed)
Hailey with [facility] pharmacy called and states the patient is upset. She cannot get her Temazepam because it needs a PA. The PA message was sent this morning to [facility].   The pharmacy wanted to know if Joy could send in a different strength. They believe the hold up is for the 22.5 mg strength.

## 2023-06-21 NOTE — Telephone Encounter (Signed)
Initiated Prior authorization [employer]:[id number] 22.5MG  capsules Via: Covermymeds Case/Key:[id number] Status: approved as of 06/21/23 Reason:This approval authorizes your coverage from 11/27/2022 - 06/20/2024 Notified Pt via: Mychart

## 2023-06-21 NOTE — Telephone Encounter (Signed)
This has been resolved. The PA has been approved.

## 2023-06-22 ENCOUNTER — Other Ambulatory Visit: Payer: Self-pay | Admitting: Physician Assistant

## 2023-06-27 ENCOUNTER — Telehealth: Payer: Self-pay | Admitting: Medical-Surgical

## 2023-06-27 NOTE — Telephone Encounter (Signed)
Pt called. She states she has body flare up and wants to get back on Gabapentin.  She saw Joy on 7/19 and wants to know if she can just send script. Pharmacy:  [facility]

## 2023-06-28 MED ORDER — GABAPENTIN 300 MG PO CAPS: 300.0000 mg | ORAL_CAPSULE | Freq: Three times a day (TID) | ORAL | 3 refills | Status: DC

## 2023-06-28 NOTE — Telephone Encounter (Signed)
Prescription sent for gabapentin 300 mg 3 times daily to [facility] [place].

## 2023-06-28 NOTE — Addendum Note (Signed)
Addended byChristen Butter on: 06/28/2023 12:31 PM   Modules accepted: Orders

## 2023-06-28 NOTE — Telephone Encounter (Signed)
I will be happy to send in a prescription for her but it looks like this was never provided by our office.  Can we verify what dose and frequency she was taking so I know to send the right thing? ___________________________________________ Thayer Ohm, DNP, APRN, FNP-BC Primary Care and Sports Medicine [facility] [place]

## 2023-07-02 ENCOUNTER — Ambulatory Visit: Payer: Medicare Other

## 2023-07-03 ENCOUNTER — Ambulatory Visit (INDEPENDENT_AMBULATORY_CARE_PROVIDER_SITE_OTHER): Payer: Medicare Other | Admitting: Medical-Surgical

## 2023-07-03 VITALS — BP 164/72 | HR 63

## 2023-07-03 DIAGNOSIS — E538 Deficiency of other specified B group vitamins: Secondary | ICD-10-CM | POA: Diagnosis not present

## 2023-07-03 MED ORDER — CYANOCOBALAMIN 1000 MCG/ML IJ SOLN
1000.0000 ug | Freq: Once | INTRAMUSCULAR | Status: AC
Start: 2023-07-03 — End: 2023-07-03
  Administered 2023-07-03: 1000 ug via INTRAMUSCULAR

## 2023-07-03 NOTE — Progress Notes (Signed)
Established Patient Office Visit  Subjective   Patient ID: Heather Thompson, female    DOB: 1951-09-02  Age: 72 y.o. MRN: [id number]  Chief Complaint  Patient presents with   Pernicious Anemia    HPI  Heather Thompson is here for a vitamin B 12 injection. Denies muscle cramps, weakness or irregular heart rate.   ROS    Objective:     BP (!) 148/72   Pulse 63    Physical Exam   No results found for any visits on 07/03/23.    The 10-year ASCVD risk score (Arnett DK, et al., 2019) is: 21.9%    Assessment & Plan:  Vitamin B 12 injection - Patient tolerated injection well without complications. Patient advised to schedule next injection 30 days from today.    HM - patient advised to schedule a Medicare Wellness visit.   Blood pressure was elevated today. She states she is having back pain.   Problem List Items Addressed This Visit       Unprioritized   Vitamin B12 deficiency - Primary    Return in about 1 month (around 08/03/2023) for B12 injection. Also needs a Medicare Wellness visit. Earna Coder, Janalyn Harder, CMA

## 2023-07-03 NOTE — Progress Notes (Signed)
Medical screening examination/treatment was performed by qualified clinical staff member and as supervising provider I was immediately available for consultation/collaboration. I have reviewed documentation and agree with assessment and plan. ° °Ila Landowski L. Byanca Kasper, DNP, APRN, FNP-BC °[facility] °Primary Care and Sports Medicine ° °

## 2023-07-04 ENCOUNTER — Other Ambulatory Visit (HOSPITAL_COMMUNITY): Payer: Self-pay | Admitting: Psychiatry

## 2023-07-05 ENCOUNTER — Other Ambulatory Visit (HOSPITAL_COMMUNITY): Payer: Self-pay | Admitting: Psychiatry

## 2023-07-05 DIAGNOSIS — F322 Major depressive disorder, single episode, severe without psychotic features: Secondary | ICD-10-CM

## 2023-07-06 DIAGNOSIS — M961 Postlaminectomy syndrome, not elsewhere classified: Secondary | ICD-10-CM

## 2023-07-06 HISTORY — DX: Postlaminectomy syndrome, not elsewhere classified: M96.1

## 2023-07-10 ENCOUNTER — Ambulatory Visit (HOSPITAL_COMMUNITY): Payer: Medicare Other | Admitting: Psychiatry

## 2023-07-18 ENCOUNTER — Encounter: Payer: Self-pay | Admitting: Nurse Practitioner

## 2023-07-23 ENCOUNTER — Ambulatory Visit: Payer: Medicare Other | Admitting: Nurse Practitioner

## 2023-07-23 ENCOUNTER — Telehealth (HOSPITAL_COMMUNITY): Payer: Self-pay | Admitting: *Deleted

## 2023-07-23 NOTE — Telephone Encounter (Signed)
Rx 90 Day Request--  [id number] [place] [place] - [address]       hydrOXYzine (ATARAX) 10 MG tablet           && desvenlafaxine (PRISTIQ) 100 MG 24 hr tablet .  Next Appt 07/24/23 Last Appt 04/10/23

## 2023-07-24 ENCOUNTER — Ambulatory Visit (INDEPENDENT_AMBULATORY_CARE_PROVIDER_SITE_OTHER): Payer: Medicare Other | Admitting: Psychiatry

## 2023-07-24 ENCOUNTER — Encounter (HOSPITAL_COMMUNITY): Payer: Self-pay | Admitting: Psychiatry

## 2023-07-24 VITALS — BP 135/72 | HR 63 | Ht 64.0 in | Wt 182.0 lb

## 2023-07-24 DIAGNOSIS — F332 Major depressive disorder, recurrent severe without psychotic features: Secondary | ICD-10-CM | POA: Diagnosis not present

## 2023-07-24 DIAGNOSIS — F411 Generalized anxiety disorder: Secondary | ICD-10-CM

## 2023-07-24 DIAGNOSIS — F5102 Adjustment insomnia: Secondary | ICD-10-CM

## 2023-07-24 DIAGNOSIS — F322 Major depressive disorder, single episode, severe without psychotic features: Secondary | ICD-10-CM
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
## 2023-07-28 ENCOUNTER — Other Ambulatory Visit (HOSPITAL_COMMUNITY): Payer: Self-pay | Admitting: Psychiatry

## 2023-07-28 DIAGNOSIS — F322 Major depressive disorder, single episode, severe without psychotic features: Secondary | ICD-10-CM

## 2023-07-29 HISTORY — PX: SPINAL CORD STIMULATOR INSERTION: SHX5378

## 2023-08-01 ENCOUNTER — Other Ambulatory Visit (HOSPITAL_COMMUNITY): Payer: Self-pay | Admitting: Psychiatry

## 2023-08-01 DIAGNOSIS — F322 Major depressive disorder, single episode, severe without psychotic features: Secondary | ICD-10-CM

## 2023-08-07 ENCOUNTER — Telehealth (HOSPITAL_COMMUNITY): Payer: Self-pay | Admitting: *Deleted

## 2023-08-07 NOTE — Telephone Encounter (Signed)
PRIOR AUTHORIZATION RECEIVED -- hydrOXYzine (ATARAX) 10 MG tablet    MEDICARE PART A AND B  -- NOT COVERED         PER Rx-- PATIENT PAID FOR MEDICATION OUT--OF POCKET $18  CALLED LVM FOR RETURN CALL-- TO F/U & CONFIRM THAT PATIENT WILL CONTINUE TO PAY OUT OF POCKET

## 2023-08-09 ENCOUNTER — Ambulatory Visit (INDEPENDENT_AMBULATORY_CARE_PROVIDER_SITE_OTHER): Payer: Medicare Other

## 2023-08-09 VITALS — BP 136/46 | HR 72 | Ht 64.0 in

## 2023-08-09 DIAGNOSIS — E538 Deficiency of other specified B group vitamins: Secondary | ICD-10-CM | POA: Diagnosis not present

## 2023-08-09 MED ORDER — CYANOCOBALAMIN 1000 MCG/ML IJ SOLN
1000.0000 ug | Freq: Once | INTRAMUSCULAR | Status: AC
Start: 2023-08-09 — End: 2023-08-09
  Administered 2023-08-09: 1000 ug via INTRAMUSCULAR

## 2023-08-09 NOTE — Patient Instructions (Signed)
Return in 30 days for B 12 injection as nurse visit.

## 2023-08-09 NOTE — Progress Notes (Signed)
Established Patient Office Visit  Subjective   Patient ID: Eilish Chey, female    DOB: 01/10/1951  Age: 72 y.o. MRN: [id number]  Chief Complaint  Patient presents with   B12 deficiency    B12 injection nurse visit.     HPI  B12 deficiency. B12 injection nurse visit. Patient denies irregular HR or GI problems.   ROS    Objective:     BP (!) 136/46   Pulse 72   Ht 5\' 4"  (1.626 m)   SpO2 98%   BMI 31.24 kg/m    Physical Exam   No results found for any visits on 08/09/23.    The 10-year ASCVD risk score (Arnett DK, et al., 2019) is: 18.8%    Assessment & Plan:  Vit B12 injection administered Right deltoid IM. Patient tolerated injection well without complications. Patient to return in 30 days for next B12 injection as nurse visit.  Problem List Items Addressed This Visit       Other   Vitamin B12 deficiency - Primary    Return in about 30 years (around 08/08/2053) for B12 injection nurse visit. Elizabeth Palau, LPN

## 2023-08-11 ENCOUNTER — Encounter: Payer: Self-pay | Admitting: Medical-Surgical

## 2023-08-11 ENCOUNTER — Encounter: Payer: Self-pay | Admitting: Nurse Practitioner

## 2023-08-13 NOTE — Telephone Encounter (Signed)
Patient scheduled.

## 2023-08-14 ENCOUNTER — Ambulatory Visit (INDEPENDENT_AMBULATORY_CARE_PROVIDER_SITE_OTHER): Payer: Medicare Other | Admitting: Medical-Surgical

## 2023-08-14 ENCOUNTER — Other Ambulatory Visit (HOSPITAL_COMMUNITY): Payer: Self-pay | Admitting: Psychiatry

## 2023-08-14 ENCOUNTER — Telehealth (HOSPITAL_COMMUNITY): Payer: Self-pay | Admitting: Psychiatry

## 2023-08-14 ENCOUNTER — Encounter: Payer: Self-pay | Admitting: Medical-Surgical

## 2023-08-14 VITALS — BP 128/75 | HR 66 | Resp 20 | Ht 64.0 in | Wt 185.0 lb

## 2023-08-14 DIAGNOSIS — R7303 Prediabetes: Secondary | ICD-10-CM | POA: Diagnosis not present

## 2023-08-14 DIAGNOSIS — R32 Unspecified urinary incontinence: Secondary | ICD-10-CM | POA: Diagnosis not present

## 2023-08-14 LAB — POCT URINALYSIS DIP (CLINITEK)
Bilirubin, UA: NEGATIVE
Glucose, UA: NEGATIVE mg/dL
Ketones, POC UA: NEGATIVE mg/dL
Leukocytes, UA: NEGATIVE
Nitrite, UA: NEGATIVE
POC PROTEIN,UA: NEGATIVE
Spec Grav, UA: 1.015 (ref 1.010–1.025)
Urobilinogen, UA: 1 U/dL
pH, UA: 8.5 — AB (ref 5.0–8.0)

## 2023-08-14 LAB — POCT GLYCOSYLATED HEMOGLOBIN (HGB A1C)
HbA1c, POC (prediabetic range): 6.1 % (ref 5.7–6.4)
Hemoglobin A1C: 6.1 % — AB (ref 4.0–5.6)

## 2023-08-14 MED ORDER — SOLIFENACIN SUCCINATE 5 MG PO TABS
5.0000 mg | ORAL_TABLET | Freq: Every day | ORAL | 3 refills | Status: DC
Start: 2023-08-14 — End: 2024-02-28

## 2023-08-14 NOTE — Telephone Encounter (Signed)
Patient walked into the clinic requesting to speak with provider or nurse both of whom were not available. States she dropped her new bottle of Hydroxyzine picked up last week in the sink last night, displayed a large amount of tablets  wrapped in a paper towel, and wants to know if these are safe to take as she had just used the sink. Suggested she call her pharmacy to inquire and agreed to send provider a note. Advised her that provider would likely not be able to respond until tomorrow morning. She stated she had one pill left that did not spill in the sink in case provider recommends a new prescription.

## 2023-08-14 NOTE — Progress Notes (Signed)
        Established patient visit  History, exam, impression, and plan:  1. Urinary incontinence, unspecified type Pleasant 72 year old female presenting today with reports of worsening issues with urinary incontinence.  She was seen recently with reports of an episode of urinary incontinence as well as nocturia.  She started on oxybutynin XL 5 mg nightly has been taking this medication however reports that has not been helpful at all.  Now her urinary symptoms have progressed to 2-3 episodes of urinary incontinence daily and nocturia that occurs around twice nightly.  Reports that she does not feel urgency or frequency and has no burning.  However she does report that if she walks into the bathroom and sees the toilet, she immediately starts to urinate and is unable to hold it.  No recent fevers, chills, flank pain, nausea, vomiting, or altered mental status.  She has an upcoming surgery on Friday and is worried that there may be infection.  POCT urinalysis negative for nitrites, leukocytes, protein, and bilirubin.  Normal specific gravity.  Trace intact blood and urobilinogen of 1.0 noted.  Sending for culture.  Checking kidney function and electrolytes today.  Discontinue Ditropan.  Start Vesicare 5 mg nightly.  Ultimately feel that she needs to be seen and evaluated by urology for further testing.  Referral placed today. - POCT URINALYSIS DIP (CLINITEK) - Urine Culture - solifenacin (VESICARE) 5 MG tablet; Take 1 tablet (5 mg total) by mouth daily.  Dispense: 90 tablet; Refill: 3 - CMP14+EGFR  2. Prediabetes Recent hemoglobin A1c was 6.2%.  Recheck today showing A1c is now at 6.1% which indicates progress.  Discussed recommendations for limiting evening snacking and making sure to choose healthier options rather than those that breakdown into simple sugars. - POCT HgB A1C  Procedures performed this visit: None.  Return in about 4 weeks (around 09/11/2023) for Urinary incontinence follow-up if  unable to get in with the urologist.  __________________________________ Thayer Ohm, DNP, APRN, FNP-BC Primary Care and Sports Medicine [facility] [place]

## 2023-08-14 NOTE — Telephone Encounter (Signed)
LVM FOR PATIENT TO F/U WITH HER Rx & EXPLAIN SITUATION TO THEM & THEY WILL INFORM HER IF HER INSURANCE WILL COVER FOR ANOTHER SCRIPT IN THIS SITUATION

## 2023-08-15 ENCOUNTER — Other Ambulatory Visit (HOSPITAL_COMMUNITY): Payer: Self-pay | Admitting: *Deleted

## 2023-08-15 MED ORDER — HYDROXYZINE HCL 10 MG PO TABS: 10.0000 mg | ORAL_TABLET | Freq: Every day | ORAL | 0 refills | Status: DC

## 2023-08-15 NOTE — Progress Notes (Signed)
Per Provider :  Jennene Downie you can send another script for 30 days, insurance may cover or patient may pay out of pocket if she has spilled her meds   Resent -- hydrOXYzine (ATARAX) 10 MG tablet

## 2023-08-15 NOTE — Telephone Encounter (Signed)
Resent -- Sign Off Please

## 2023-08-16 ENCOUNTER — Encounter: Payer: Self-pay | Admitting: Medical-Surgical

## 2023-08-16 LAB — URINE CULTURE

## 2023-08-17 ENCOUNTER — Inpatient Hospital Stay: Payer: Medicare Other

## 2023-08-17 ENCOUNTER — Inpatient Hospital Stay: Payer: Medicare Other | Admitting: Family

## 2023-08-17 ENCOUNTER — Inpatient Hospital Stay: Payer: Medicare Other | Attending: Hematology & Oncology

## 2023-08-21 ENCOUNTER — Telehealth (HOSPITAL_COMMUNITY): Payer: Self-pay | Admitting: *Deleted

## 2023-08-21 NOTE — Telephone Encounter (Signed)
[employer] Notice of Medicare Prescription Drug Coverage Part D  Member ID # [id number]  hydrOXYzine (ATARAX) 10 MG tablet  Approval Authorized Coverage : 11/27/22 ----08/19/24

## 2023-08-27 ENCOUNTER — Ambulatory Visit (INDEPENDENT_AMBULATORY_CARE_PROVIDER_SITE_OTHER): Payer: Medicare Other | Admitting: Nurse Practitioner

## 2023-08-27 ENCOUNTER — Encounter: Payer: Self-pay | Admitting: Nurse Practitioner

## 2023-08-27 ENCOUNTER — Telehealth: Payer: Self-pay

## 2023-08-27 VITALS — BP 138/80 | HR 68 | Temp 98.4°F | Ht 64.0 in | Wt 184.0 lb

## 2023-08-27 DIAGNOSIS — I10 Essential (primary) hypertension: Secondary | ICD-10-CM

## 2023-08-27 DIAGNOSIS — E669 Obesity, unspecified: Secondary | ICD-10-CM | POA: Diagnosis not present

## 2023-08-27 DIAGNOSIS — Z6831 Body mass index (BMI) 31.0-31.9, adult: Secondary | ICD-10-CM

## 2023-08-27 DIAGNOSIS — R7303 Prediabetes: Secondary | ICD-10-CM | POA: Diagnosis not present

## 2023-08-27 DIAGNOSIS — E7849 Other hyperlipidemia: Secondary | ICD-10-CM | POA: Diagnosis not present

## 2023-08-27 MED ORDER — WEGOVY 0.25 MG/0.5ML ~~LOC~~ SOAJ
0.2500 mg | SUBCUTANEOUS | 0 refills | Status: DC
Start: 2023-08-27 — End: 2023-08-30

## 2023-08-27 NOTE — Progress Notes (Signed)
Office: [phone]  /  Fax: [phone]  WEIGHT SUMMARY AND BIOMETRICS  Weight Lost Since Last Visit: 0lb  Weight Gained Since Last Visit: 7lb   Vitals Temp: 98.4 F (36.9 C) BP: 138/80 Pulse Rate: 68 SpO2: 100 %   Anthropometric Measurements Height: 5\' 4"  (1.626 m) Weight: 184 lb (83.5 kg) BMI (Calculated): 31.57 Weight at Last Visit: 177lb Weight Lost Since Last Visit: 0lb Weight Gained Since Last Visit: 7lb Starting Weight: 178lb Total Weight Loss (lbs): 0 lb (0 kg)   Body Composition  Body Fat %: 46 % Fat Mass (lbs): 84.8 lbs Muscle Mass (lbs): 94.6 lbs Total Body Water (lbs): 72 lbs Visceral Fat Rating : 13   Other Clinical Data Fasting: No Labs: No Today's Visit #: 5 Starting Date: 03/27/23     HPI  Chief Complaint: OBESITY  Keidy is here to discuss her progress with her obesity treatment plan. She is on the the Category 2 Plan and states she is following her eating plan approximately 0 % of the time. She states she is exercising 0 minutes 0 days per week.  Patient has co morbidities of: HTN, HLD, pre diabetes, atherosclerotic disease of the abdominal aorta per CT scan, Vit B12 def, Vit D def, back pain, DDD, OA, OP.   Interval History:  Since last office visit on 06/04/23 she has gained 7 pounds.  She had a spinal cord stimulator placed on Sept 20th.  Is overall doing well with pain. She notes she is not doing well with the meal plan. She is either eating too much or not enough. She has been home for the past 9 days and hasn't been able to drive.  She has been relying on friends to bring her food.  She is drinking water daily.  Unable to exercise due to pain.   Pharmacotherapy for weight loss: She is not currently taking medications  for medical weight loss.    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 Her highest weight was 267 lbs and nadir weight was 154 lbs. Her  weight has fluctuated over the years. Highest weight since having surgery was 216 lbs.   Hypertension Hypertension looks better.  Medication(s): Zestoretic 10-25mg . Denies side effects.  Denies chest pain, palpitations and SOB.  BP Readings from Last 3 Encounters:  08/27/23 138/80  08/14/23 128/75  08/09/23 (!) 136/46   Lab Results  Component Value Date   CREATININE 0.68 08/14/2023   CREATININE 0.68 03/27/2023   CREATININE 0.80 02/20/2023     Hyperlipidemia Medication(s): Pravastatin 20mg . Denies side effects.   Cardiovascular risk factors: advanced age (older than 67 for men, 68 for women), dyslipidemia, family history of premature cardiovascular disease, hypertension, obesity (BMI >= 30 kg/m2), and sedentary lifestyle FH:  mother-71 yo MI, father 42s MI & CVA  Lab Results  Component Value Date   CHOL 213 (H) 02/20/2023   HDL 89 02/20/2023   LDLCALC 104 (H) 02/20/2023   TRIG 106 02/20/2023   CHOLHDL 2.4 02/20/2023   Lab Results  Component Value Date   ALT 29 08/14/2023   AST 30 08/14/2023   ALKPHOS 107 08/14/2023   BILITOT 0.4 08/14/2023   The 10-year ASCVD risk score (Arnett DK, et al., 2019) is: 19.3%   Values used to calculate the score:     Age: 81 years     Sex: Female     Is Non-Hispanic African American: No  Diabetic: No     Tobacco smoker: No     Systolic Blood Pressure: 138 mmHg     Is BP treated: Yes     HDL Cholesterol: 89 mg/dL     Total Cholesterol: 213 mg/dL   Prediabetes Last Y8M was 6.1  Medication(s): none Polyphagia:Yes FH:  brother with DMT2  Lab Results  Component Value Date   HGBA1C 6.1 (A) 08/14/2023   HGBA1C 6.1 08/14/2023   HGBA1C 6.2 (H) 04/10/2023   HGBA1C 6.2 (H) 12/19/2022   HGBA1C 6.0 (H) 04/06/2022   Lab Results  Component Value Date   INSULIN 8.7 03/27/2023   PHYSICAL EXAM:  Blood pressure 138/80, pulse 68, temperature 98.4 F (36.9 C), height 5\' 4"  (1.626 m), weight 184 lb (83.5 kg), SpO2 100%. Body mass  index is 31.58 kg/m.  General: She is overweight, cooperative, alert, well developed, and in no acute distress. PSYCH: Has normal mood, affect and thought process.   Extremities: No edema.  Neurologic: No gross sensory or motor deficits. No tremors or fasciculations noted.    DIAGNOSTIC DATA REVIEWED:  BMET    Component Value Date/Time   NA 136 08/14/2023 1357   K 4.8 08/14/2023 1357   CL 95 (L) 08/14/2023 1357   CO2 26 08/14/2023 1357   GLUCOSE 101 (H) 08/14/2023 1357   GLUCOSE 92 02/20/2023 1128   BUN 17 08/14/2023 1357   CREATININE 0.68 08/14/2023 1357   CREATININE 0.80 02/20/2023 1128   CALCIUM 9.6 08/14/2023 1357   GFRNONAA >60 09/07/2021 1433   Lab Results  Component Value Date   HGBA1C 6.1 (A) 08/14/2023   HGBA1C 6.1 08/14/2023   HGBA1C 6.0 (H) 04/06/2022   Lab Results  Component Value Date   INSULIN 8.7 03/27/2023   Lab Results  Component Value Date   TSH 4.550 (H) 03/27/2023   CBC    Component Value Date/Time   WBC 5.2 02/14/2023 1352   WBC 4.7 12/19/2022 1157   RBC 3.99 02/14/2023 1352   RBC 3.97 02/14/2023 1351   HGB 12.0 02/14/2023 1352   HCT 36.7 02/14/2023 1352   PLT 200 02/14/2023 1352   MCV 92.0 02/14/2023 1352   MCH 30.1 02/14/2023 1352   MCHC 32.7 02/14/2023 1352   RDW 13.0 02/14/2023 1352   Iron Studies    Component Value Date/Time   IRON 93 02/14/2023 1352   TIBC 392 02/14/2023 1352   FERRITIN 123 02/14/2023 1352   IRONPCTSAT 24 02/14/2023 1352   IRONPCTSAT 25 12/19/2022 1157   Lipid Panel     Component Value Date/Time   CHOL 213 (H) 02/20/2023 1128   TRIG 106 02/20/2023 1128   HDL 89 02/20/2023 1128   CHOLHDL 2.4 02/20/2023 1128   LDLCALC 104 (H) 02/20/2023 1128   Hepatic Function Panel     Component Value Date/Time   PROT 6.9 08/14/2023 1357   ALBUMIN 4.2 08/14/2023 1357   AST 30 08/14/2023 1357   AST 30 09/07/2021 1433   ALT 29 08/14/2023 1357   ALT 31 09/07/2021 1433   ALKPHOS 107 08/14/2023 1357   BILITOT 0.4  08/14/2023 1357   BILITOT 0.3 09/07/2021 1433   BILIDIR 0.12 04/10/2023 1624      Component Value Date/Time   TSH 4.550 (H) 03/27/2023 0957   Nutritional Lab Results  Component Value Date   VD25OH 46.8 03/27/2023   VD25OH 78 07/10/2022     ASSESSMENT AND PLAN  TREATMENT PLAN FOR OBESITY:  Recommended Dietary Goals  Redonna is currently  in the action stage of change. As such, her goal is to continue weight management plan. She has agreed to the Category 2 Plan.  Behavioral Intervention  We discussed the following Behavioral Modification Strategies today: increasing lean protein intake, decreasing simple carbohydrates , increasing vegetables, increasing lower glycemic fruits, increasing water intake, work on meal planning and preparation, reading food labels , keeping healthy foods at home, continue to practice mindfulness when eating, and planning for success.  Additional resources provided today: NA  Recommended Physical Activity Goals  Gwenn has been advised to work up to 150 minutes of moderate intensity aerobic activity a week and strengthening exercises 2-3 times per week for cardiovascular health, weight loss maintenance and preservation of muscle mass.   She has agreed to Unable to participate in physical activity at present due to medical conditions    Pharmacotherapy We discussed various medication options to help [facility] with her weight loss efforts and we both agreed to start [facility] 0.25mg . Side effects discussed. Patient would benefit due to her co morbidities.  See SELECT study.    Contraindications: Pancreatitis (active gallstones) Medullary thyroid cancer High triglycerides (>500)-will need labs prior to starting Multiple Endocrine Neoplasia syndrome type 2 (MEN 2) Trying to get pregnant Breastfeeding Use with caution with taking insulin or sulfonylureas (will need to monitor blood sugars for hypoglycemia)  ASSOCIATED CONDITIONS ADDRESSED  TODAY  Action/Plan  Primary hypertension -     HYQMVH; Inject 0.25 mg into the skin once a week.  Dispense: 2 mL; Refill: 0  Continue to follow up with PCP. Continue meds as directed  Other hyperlipidemia -     Wegovy; Inject 0.25 mg into the skin once a week.  Dispense: 2 mL; Refill: 0  Continue to follow up with PCP. Continue meds as directed  Prediabetes -     QIONGE; Inject 0.25 mg into the skin once a week.  Dispense: 2 mL; Refill: 0  Abbegale will continue to work on weight loss, exercise, and decreasing simple carbohydrates to help decrease the risk of diabetes.    Generalized obesity -     [id number]; Inject 0.25 mg into the skin once a week.  Dispense: 2 mL; Refill: 0  BMI 31.0-31.9,adult -     XBMWUX; Inject 0.25 mg into the skin once a week.  Dispense: 2 mL; Refill: 0      Return in about 3 weeks (around 09/17/2023).Marland Kitchen She was informed of the importance of frequent follow up visits to maximize her success with intensive lifestyle modifications for her multiple health conditions.   ATTESTASTION STATEMENTS:  Reviewed by clinician on day of visit: allergies, medications, problem list, medical history, surgical history, family history, social history, and previous encounter notes.     Theodis Sato. Jacyln Carmer FNP-C

## 2023-08-27 NOTE — Telephone Encounter (Signed)
PA submitted through Cover My Meds for [facility]. Awaiting insurance determination. Key: [id number]

## 2023-08-27 NOTE — Patient Instructions (Signed)

## 2023-08-28 ENCOUNTER — Encounter: Payer: Self-pay | Admitting: Urology

## 2023-08-28 ENCOUNTER — Ambulatory Visit (INDEPENDENT_AMBULATORY_CARE_PROVIDER_SITE_OTHER): Payer: Medicare Other | Admitting: Urology

## 2023-08-28 VITALS — BP 146/83 | HR 81 | Ht 64.0 in | Wt 188.0 lb

## 2023-08-28 DIAGNOSIS — N3281 Overactive bladder: Secondary | ICD-10-CM

## 2023-08-28 DIAGNOSIS — N3941 Urge incontinence: Secondary | ICD-10-CM

## 2023-08-28 HISTORY — DX: Urge incontinence: N39.41

## 2023-08-28 HISTORY — DX: Overactive bladder: N32.81

## 2023-08-28 LAB — BLADDER SCAN AMB NON-IMAGING

## 2023-08-28 NOTE — Progress Notes (Signed)
Assessment: 1. Urge incontinence   2. OAB (overactive bladder)     Plan: I personally reviewed the patient's chart including provider notes, lab results. Diagnosis and management of overactive bladder and urge incontinence discussed with the patient today.  Options for management including avoidance of dietary irritants, behavioral modification, medical therapy, neuromodulation, and chemodenervation discussed. Bladder diet sheet provided. Continue Solifenacin 5 mg daily as this is controlling her symptoms very well at the present time. Return to office in 6 months.  Chief Complaint:  Chief Complaint  Patient presents with   Urinary Incontinence    History of Present Illness:  Heather Thompson is a 72 y.o. female who is seen in consultation from Christen Butter, NP for evaluation of OAB and urge incontinence.   She has a long history of overactive bladder symptoms with some associated incontinence.  She was previously treated at the [facility] clinic and 2018 with oxybutynin and Myrbetriq.  She reports a recent increase in her frequency, urgency, and urge incontinence for several months.  She was previously on oxybutynin which was not working well in controlling her symptoms.  She was recently changed to Solifenacin 5 mg daily.  She has noted a significant improvement in her symptoms with this medication.  She reports some dry mouth but no problems with constipation.  No incontinence at the present time.  She has noted a decrease in her frequency and urgency as well.  No dysuria or gross hematuria.  No recent UTIs.   Past Medical History:  Past Medical History:  Diagnosis Date   Allergy    Anemia    Anemia    Anxiety    B12 deficiency    Back pain    Chronic fatigue syndrome    Clotting disorder (HCC)    Constipation    Depression    Depression    Edema of both lower extremities    Fatty liver    Fibromyalgia    Gallbladder problem    High cholesterol    History of blood clots     History of Roux-en-Y gastric bypass    Hypertension    Hypothyroidism    Hypothyroidism    Infertility, female    Joint pain    Kidney stones    Muscle disorder    Muscle disorder    Myoadenylate deaminase deficiency (HCC)    Osteoarthritis    Prediabetes    Salzmann nodular degeneration    SOBOE (shortness of breath on exertion)    Swallowing difficulty    Vitamin D deficiency     Past Surgical History:  Past Surgical History:  Procedure Laterality Date   ABDOMINAL HYSTERECTOMY     APPENDECTOMY     CERVICAL FUSION     CHOLECYSTECTOMY     FOOT SURGERY Bilateral    fusion   GASTRIC BYPASS     TOTAL HIP ARTHROPLASTY Bilateral    11/2016 & 05/2017    Allergies:  Allergies  Allergen Reactions   Compazine [Prochlorperazine] Anaphylaxis   Phenothiazines Anaphylaxis and Swelling    Caused her throat to close     Pregabalin Itching and Swelling    Swelling and itching of hands and feet.      Prednisone Rash          Family History:  Family History  Problem Relation Age of Onset   Hyperlipidemia Mother    Hypertension Mother    Heart attack Mother    Heart disease Mother    Sudden death  Mother    Depression Mother    Anxiety disorder Mother    Obesity Mother    Anxiety disorder Father    Depression Father    Cancer Father    Hyperlipidemia Father    Hypertension Father    Lung cancer Father    Prostate cancer Father    Kidney cancer Brother    Healthy Daughter    Healthy Son     Social History:  Social History   Tobacco Use   Smoking status: Never    Passive exposure: Past   Smokeless tobacco: Never  Vaping Use   Vaping status: Never Used  Substance Use Topics   Alcohol use: Not Currently    Comment: rarely   Drug use: Never    Review of symptoms:  Constitutional:  Negative for unexplained weight loss, night sweats, fever, chills ENT:  Negative for nose bleeds, sinus pain, painful swallowing CV:  Negative for chest pain, shortness of  breath, exercise intolerance, palpitations, loss of consciousness Resp:  Negative for cough, wheezing, shortness of breath GI:  Negative for nausea, vomiting, diarrhea, bloody stools GU:  Positives noted in HPI; otherwise negative for gross hematuria, dysuria Neuro:  Negative for seizures, poor balance, limb weakness, slurred speech Psych:  Negative for lack of energy, depression, anxiety Endocrine:  Negative for polydipsia, polyuria, symptoms of hypoglycemia (dizziness, hunger, sweating) Hematologic:  Negative for anemia, purpura, petechia, prolonged or excessive bleeding, use of anticoagulants  Allergic:  Negative for difficulty breathing or choking as a result of exposure to anything; no shellfish allergy; no allergic response (rash/itch) to materials, foods  Physical exam: BP (!) 146/83   Pulse 81   Ht 5\' 4"  (1.626 m)   Wt 188 lb (85.3 kg)   BMI 32.27 kg/m  GENERAL APPEARANCE:  Well appearing, well developed, well nourished, NAD HEENT: Atraumatic, Normocephalic, oropharynx clear. NECK: Supple without lymphadenopathy or thyromegaly. LUNGS: Clear to auscultation bilaterally. HEART: Regular Rate and Rhythm without murmurs, gallops, or rubs. ABDOMEN: Soft, non-tender, No Masses. EXTREMITIES: Moves all extremities well.  Without clubbing, cyanosis, or edema. NEUROLOGIC:  Alert and oriented x 3, normal gait, CN II-XII grossly intact.  MENTAL STATUS:  Appropriate. BACK:  Non-tender to palpation.  No CVAT SKIN:  Warm, dry and intact.    Results: U/A:  negative  Results for orders placed or performed in visit on 08/28/23 (from the past 24 hour(s))  Bladder Scan (Post Void Residual) in office   Collection Time: 08/28/23  1:45 PM  Result Value Ref Range   Scan Result 0mL

## 2023-08-29 LAB — URINALYSIS, ROUTINE W REFLEX MICROSCOPIC
Bilirubin, UA: NEGATIVE
Glucose, UA: NEGATIVE
Ketones, UA: NEGATIVE
Leukocytes,UA: NEGATIVE
Nitrite, UA: NEGATIVE
Protein,UA: NEGATIVE
RBC, UA: NEGATIVE
Specific Gravity, UA: 1.02 (ref 1.005–1.030)
Urobilinogen, Ur: 0.2 mg/dL (ref 0.2–1.0)
pH, UA: 7.5 (ref 5.0–7.5)

## 2023-08-29 NOTE — Telephone Encounter (Signed)
Wegovy approved 11/27/22-08/27/24.

## 2023-08-30 ENCOUNTER — Telehealth (INDEPENDENT_AMBULATORY_CARE_PROVIDER_SITE_OTHER): Payer: Self-pay | Admitting: Nurse Practitioner

## 2023-08-30 ENCOUNTER — Other Ambulatory Visit: Payer: Self-pay | Admitting: Nurse Practitioner

## 2023-08-30 ENCOUNTER — Encounter: Payer: Self-pay | Admitting: Nurse Practitioner

## 2023-08-30 DIAGNOSIS — I10 Essential (primary) hypertension: Secondary | ICD-10-CM

## 2023-08-30 DIAGNOSIS — E669 Obesity, unspecified: Secondary | ICD-10-CM

## 2023-08-30 DIAGNOSIS — E7849 Other hyperlipidemia: Secondary | ICD-10-CM

## 2023-08-30 DIAGNOSIS — Z6831 Body mass index (BMI) 31.0-31.9, adult: Secondary | ICD-10-CM

## 2023-08-30 DIAGNOSIS — R7303 Prediabetes: Secondary | ICD-10-CM

## 2023-08-30 MED ORDER — WEGOVY 0.25 MG/0.5ML ~~LOC~~ SOAJ
0.2500 mg | SUBCUTANEOUS | 0 refills | Status: DC
Start: 2023-08-30 — End: 2023-09-13

## 2023-08-30 NOTE — Telephone Encounter (Signed)
Pt called stating that she cannot not find Semaglutide-Weight Management (WEGOVY) 0.25 MG/0.5ML SOAJ anywhere. Pt states that she has called every place that [place] suggested. Pt also states that her insurance has approved the medication, but that she cannot locate it anywhere. Patient would like a call back as soon as possible.

## 2023-08-30 NOTE — Telephone Encounter (Signed)
Patient called in stating she finally got her Reginal Lutes approved through her Insurance, however now she cannot find a pharmacy that has [place] in stock.  She stated she has called [facility], [facility], [facility] and [facility] and none had this medication in stock.  Thank  You

## 2023-08-30 NOTE — Telephone Encounter (Signed)
Message sent to patient

## 2023-08-31 ENCOUNTER — Other Ambulatory Visit (HOSPITAL_COMMUNITY): Payer: Self-pay | Admitting: Psychiatry

## 2023-08-31 DIAGNOSIS — F322 Major depressive disorder, single episode, severe without psychotic features: Secondary | ICD-10-CM

## 2023-09-03 ENCOUNTER — Telehealth (INDEPENDENT_AMBULATORY_CARE_PROVIDER_SITE_OTHER): Payer: Self-pay | Admitting: Nurse Practitioner

## 2023-09-03 NOTE — Telephone Encounter (Signed)
Patient called stating that the [facility] would be $900.00 which is the price after the insurance covers half per month. Pt states that she cannot afford this. Please call patient once you can. Pt wants to speak with Judeth Cornfield.

## 2023-09-04 ENCOUNTER — Encounter: Payer: Self-pay | Admitting: Sports Medicine

## 2023-09-04 ENCOUNTER — Other Ambulatory Visit (INDEPENDENT_AMBULATORY_CARE_PROVIDER_SITE_OTHER): Payer: Medicare Other

## 2023-09-04 ENCOUNTER — Ambulatory Visit (INDEPENDENT_AMBULATORY_CARE_PROVIDER_SITE_OTHER): Payer: Medicare Other | Admitting: Sports Medicine

## 2023-09-04 DIAGNOSIS — M170 Bilateral primary osteoarthritis of knee: Secondary | ICD-10-CM | POA: Diagnosis not present

## 2023-09-04 MED ORDER — TRIAMCINOLONE ACETONIDE 40 MG/ML IJ SUSP
40.0000 mg | Freq: Once | INTRAMUSCULAR | Status: AC
Start: 2023-09-04 — End: 2023-09-04
  Administered 2023-09-04: 40 mg via INTRAMUSCULAR

## 2023-09-04 NOTE — Assessment & Plan Note (Signed)
Pleasant 72 year old female, bilateral knee osteoarthritis, last injection was November 2023, left knee is doing okay, only minimal pain, right knee is hurting, repeat right steroid injection, return to see me as needed.

## 2023-09-04 NOTE — Progress Notes (Signed)
    Procedures performed today:    Procedure: Real-time Ultrasound Guided injection of the right knee Device: Samsung HS60  Verbal informed consent obtained.  Time-out conducted.  Noted no overlying erythema, induration, or other signs of local infection.  Skin prepped in a sterile fashion.  Local anesthesia: Topical Ethyl chloride.  With sterile technique and under real time ultrasound guidance: mild effusion noted 1 cc Kenalog 40, 2 cc lidocaine, 2 cc bupivacaine injected easily Completed without difficulty  Advised to call if fevers/chills, erythema, induration, drainage, or persistent bleeding.  Images permanently stored and available for review in PACS.  Impression: Technically successful ultrasound guided injection.  Independent interpretation of notes and tests performed by another provider:   None.  Brief History, Exam, Impression, and Recommendations:    Primary osteoarthritis of both knees Pleasant 72 year old female, bilateral knee osteoarthritis, last injection was November 2023, left knee is doing okay, only minimal pain, right knee is hurting, repeat right steroid injection, return to see me as needed.    ____________________________________________ Ihor Austin. Benjamin Stain, M.D., ABFM., CAQSM., AME. Primary Care and Sports Medicine  [facility]  Adjunct Professor of Family Medicine  [employer]  Restaurant manager, fast food

## 2023-09-04 NOTE — Addendum Note (Signed)
Addended by: Carren Rang A on: 09/04/2023 02:14 PM   Modules accepted: Orders

## 2023-09-10 ENCOUNTER — Ambulatory Visit: Payer: Medicare Other

## 2023-09-10 DIAGNOSIS — M19031 Primary osteoarthritis, right wrist: Secondary | ICD-10-CM | POA: Insufficient documentation

## 2023-09-10 HISTORY — DX: Primary osteoarthritis, right wrist: M19.031

## 2023-09-13 ENCOUNTER — Encounter: Payer: Self-pay | Admitting: Nurse Practitioner

## 2023-09-13 ENCOUNTER — Ambulatory Visit: Payer: Medicare Other | Admitting: Nurse Practitioner

## 2023-09-13 VITALS — BP 135/76 | HR 84 | Temp 98.2°F | Ht 64.0 in | Wt 188.0 lb

## 2023-09-13 DIAGNOSIS — R632 Polyphagia: Secondary | ICD-10-CM | POA: Diagnosis not present

## 2023-09-13 DIAGNOSIS — Z6832 Body mass index (BMI) 32.0-32.9, adult: Secondary | ICD-10-CM | POA: Diagnosis not present

## 2023-09-13 DIAGNOSIS — E669 Obesity, unspecified: Secondary | ICD-10-CM | POA: Diagnosis not present

## 2023-09-13 MED ORDER — LIRAGLUTIDE 18 MG/3ML ~~LOC~~ SOPN: 0.6000 mg | PEN_INJECTOR | Freq: Every day | SUBCUTANEOUS | 0 refills | Status: DC

## 2023-09-13 MED ORDER — INSULIN PEN NEEDLE 31G X 5 MM MISC
0 refills | Status: DC
Start: 2023-09-13 — End: 2023-09-17

## 2023-09-13 NOTE — Progress Notes (Signed)
Office: [phone]  /  Fax: [phone]  WEIGHT SUMMARY AND BIOMETRICS  Weight Lost Since Last Visit: 0lb  Weight Gained Since Last Visit: 4lb   Vitals Temp: 98.2 F (36.8 C) BP: 135/76 Pulse Rate: 84 SpO2: 100 %   Anthropometric Measurements Height: 5\' 4"  (1.626 m) Weight: 188 lb (85.3 kg) BMI (Calculated): 32.25 Weight at Last Visit: 184lb Weight Lost Since Last Visit: 0lb Weight Gained Since Last Visit: 4lb Starting Weight: 178lb Total Weight Loss (lbs): 0 lb (0 kg)   Body Composition  Body Fat %: 47.1 % Fat Mass (lbs): 88.6 lbs Muscle Mass (lbs): 94.6 lbs Total Body Water (lbs): 75.2 lbs Visceral Fat Rating : 14   Other Clinical Data Fasting: No Labs: No Today's Visit #: 6 Starting Date: 03/27/23     HPI  Chief Complaint: OBESITY  Heather Thompson is here to discuss her progress with her obesity treatment plan. She is on the the Category 2 Plan and states she is following her eating plan approximately 50 % of the time. She states she is exercising 0 minutes 0 days per week, due to recent back surgery.   Interval History:  Since last office visit she has gained 4 pounds.  She was planning to have knee surgery this year but decided to hold off until next year.  She is struggling with polyphagia and cravings. Wasn't able to start [facility] due to cost.    Pharmacotherapy for weight loss: She is not currently taking medications  for medical weight loss.    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 Her highest weight was 267 lbs and nadir weight was 154 lbs. Her weight has fluctuated over the years. Highest weight since having surgery was 216 lbs.   PHYSICAL EXAM:  Blood pressure 135/76, pulse 84, temperature 98.2 F (36.8 C), height 5\' 4"  (1.626 m), weight 188 lb (85.3 kg), SpO2 100%. Body mass index is 32.27 kg/m.  General: She is overweight, cooperative, alert, well developed,  and in no acute distress. PSYCH: Has normal mood, affect and thought process.   Extremities: No edema.  Neurologic: No gross sensory or motor deficits. No tremors or fasciculations noted.    DIAGNOSTIC DATA REVIEWED:  BMET    Component Value Date/Time   NA 136 08/14/2023 1357   K 4.8 08/14/2023 1357   CL 95 (L) 08/14/2023 1357   CO2 26 08/14/2023 1357   GLUCOSE 101 (H) 08/14/2023 1357   GLUCOSE 92 02/20/2023 1128   BUN 17 08/14/2023 1357   CREATININE 0.68 08/14/2023 1357   CREATININE 0.80 02/20/2023 1128   CALCIUM 9.6 08/14/2023 1357   GFRNONAA >60 09/07/2021 1433   Lab Results  Component Value Date   HGBA1C 6.1 (A) 08/14/2023   HGBA1C 6.1 08/14/2023   HGBA1C 6.0 (H) 04/06/2022   Lab Results  Component Value Date   INSULIN 8.7 03/27/2023   Lab Results  Component Value Date   TSH 4.550 (H) 03/27/2023   CBC    Component Value Date/Time   WBC 5.2 02/14/2023 1352   WBC 4.7 12/19/2022 1157   RBC 3.99 02/14/2023 1352   RBC 3.97 02/14/2023 1351   HGB 12.0 02/14/2023 1352   HCT 36.7 02/14/2023 1352   PLT 200 02/14/2023 1352   MCV 92.0 02/14/2023 1352   MCH 30.1 02/14/2023 1352   MCHC 32.7 02/14/2023 1352   RDW 13.0 02/14/2023 1352   Iron Studies  Component Value Date/Time   IRON 93 02/14/2023 1352   TIBC 392 02/14/2023 1352   FERRITIN 123 02/14/2023 1352   IRONPCTSAT 24 02/14/2023 1352   IRONPCTSAT 25 12/19/2022 1157   Lipid Panel     Component Value Date/Time   CHOL 213 (H) 02/20/2023 1128   TRIG 106 02/20/2023 1128   HDL 89 02/20/2023 1128   CHOLHDL 2.4 02/20/2023 1128   LDLCALC 104 (H) 02/20/2023 1128   Hepatic Function Panel     Component Value Date/Time   PROT 6.9 08/14/2023 1357   ALBUMIN 4.2 08/14/2023 1357   AST 30 08/14/2023 1357   AST 30 09/07/2021 1433   ALT 29 08/14/2023 1357   ALT 31 09/07/2021 1433   ALKPHOS 107 08/14/2023 1357   BILITOT 0.4 08/14/2023 1357   BILITOT 0.3 09/07/2021 1433   BILIDIR 0.12 04/10/2023 1624       Component Value Date/Time   TSH 4.550 (H) 03/27/2023 0957   Nutritional Lab Results  Component Value Date   VD25OH 46.8 03/27/2023   VD25OH 78 07/10/2022     ASSESSMENT AND PLAN  TREATMENT PLAN FOR OBESITY:  Recommended Dietary Goals  Heather Thompson is currently in the action stage of change. As such, her goal is to continue weight management plan. She has agreed to the Category 2 Plan.  Behavioral Intervention  We discussed the following Behavioral Modification Strategies today: increasing lean protein intake to established goals, increasing vegetables, increasing water intake , reading food labels , keeping healthy foods at home, and continue to work on maintaining a reduced calorie state, getting the recommended amount of protein, incorporating whole foods, making healthy choices, staying well hydrated and practicing mindfulness when eating..  Additional resources provided today: NA  Recommended Physical Activity Goals  Heather Thompson has been advised to work up to 150 minutes of moderate intensity aerobic activity a week and strengthening exercises 2-3 times per week for cardiovascular health, weight loss maintenance and preservation of muscle mass.   She is Unable to participate in physical activity at present due to medical conditions    Pharmacotherapy We discussed various medication options to help Heather Thompson with her weight loss efforts and we both agreed to start Victoza 0.6mg  off label for weight loss.  Side effects discussed.  ASSOCIATED CONDITIONS ADDRESSED TODAY  Action/Plan  Polyphagia -     Liraglutide; Inject 0.6 mg into the skin daily.  Dispense: 3 mL; Refill: 0 -     Insulin Pen Needle; Take as directed with Victoza  Dispense: 50 each; Refill: 0  Intensive lifestyle modifications are the first line treatment for this issue. We discussed several lifestyle modifications today and she will continue to work on diet, exercise and weight loss efforts. Orders and follow up as  documented in patient record.  Counseling Polyphagia is excessive hunger. Causes can include: low blood sugars, hypERthyroidism, PMS, lack of sleep, stress, insulin resistance, diabetes, certain medications, and diets that are deficient in protein and fiber.    Generalized obesity -     Liraglutide; Inject 0.6 mg into the skin daily.  Dispense: 3 mL; Refill: 0 -     Insulin Pen Needle; Take as directed with Victoza  Dispense: 50 each; Refill: 0  BMI 32.0-32.9,adult         Return in about 4 weeks (around 10/11/2023).Marland Kitchen She was informed of the importance of frequent follow up visits to maximize her success with intensive lifestyle modifications for her multiple health conditions.   ATTESTASTION STATEMENTS:  Reviewed by  clinician on day of visit: allergies, medications, problem list, medical history, surgical history, family history, social history, and previous encounter notes.     Theodis Sato. Dut[place]ss Crosland FNP-C

## 2023-09-13 NOTE — Patient Instructions (Signed)

## 2023-09-14 ENCOUNTER — Telehealth: Payer: Self-pay | Admitting: Sports Medicine

## 2023-09-14 NOTE — Telephone Encounter (Signed)
Patient seen on October 8th for a cortisone shot she said that it didn't work just [employer]

## 2023-09-17 ENCOUNTER — Ambulatory Visit (INDEPENDENT_AMBULATORY_CARE_PROVIDER_SITE_OTHER): Payer: Medicare Other | Admitting: Medical-Surgical

## 2023-09-17 ENCOUNTER — Encounter: Payer: Self-pay | Admitting: Medical-Surgical

## 2023-09-17 ENCOUNTER — Encounter: Payer: Self-pay | Admitting: Sports Medicine

## 2023-09-17 ENCOUNTER — Telehealth (INDEPENDENT_AMBULATORY_CARE_PROVIDER_SITE_OTHER): Payer: Self-pay | Admitting: Nurse Practitioner

## 2023-09-17 VITALS — BP 139/74 | HR 65 | Resp 20 | Ht 64.0 in | Wt 189.4 lb

## 2023-09-17 DIAGNOSIS — I10 Essential (primary) hypertension: Secondary | ICD-10-CM

## 2023-09-17 DIAGNOSIS — F418 Other specified anxiety disorders: Secondary | ICD-10-CM

## 2023-09-17 DIAGNOSIS — E792 Myoadenylate deaminase deficiency: Secondary | ICD-10-CM

## 2023-09-17 DIAGNOSIS — E538 Deficiency of other specified B group vitamins: Secondary | ICD-10-CM

## 2023-09-17 DIAGNOSIS — Z6832 Body mass index (BMI) 32.0-32.9, adult: Secondary | ICD-10-CM

## 2023-09-17 MED ORDER — CYANOCOBALAMIN 1000 MCG/ML IJ SOLN
1000.0000 ug | Freq: Once | INTRAMUSCULAR | Status: AC
Start: 2023-09-17 — End: 2023-09-17
  Administered 2023-09-17: 1000 ug via INTRAMUSCULAR

## 2023-09-17 NOTE — Telephone Encounter (Signed)
Patient called in to say Heather Thompson gave her a script for Victoza stating it should only be around $117.00 out of pocket expense for her, however both [facility] and her pharmacy [facility]) both told her the out of pocket expense is going to be well over $200.00.  Also patient stated Heather Thompson wanted her to speak with her PCP in regards to any of her meds contributing to her weight gain  and her pcp said she would look into it and see. Patient is asking for a call back in reference to the Victoza.

## 2023-09-17 NOTE — Progress Notes (Signed)
        Established patient visit  History, exam, impression, and plan:  1. Primary hypertension Very pleasant 72 year old female presenting today with a history of hypertension currently well-managed with lisinopril-hydrochlorothiazide 10-12.5 mg once daily along with propranolol 20 mg daily as needed.  Reports the medication is well-tolerated and she has no concerning side effects.  Frequent doctors appointments showing good control of her blood pressure.  Following low-sodium heart healthy diet.  No regular intentional exercise due to severe limitation with orthopedic concerns and chronic pain.  Up-to-date on recent labs so deferring today.  Blood pressure slightly higher than goal systolically but with her age and current medications, this is permissible.  Continue lisinopril-HCTZ and propranolol as prescribed.  2. Vitamin B12 deficiency History of vitamin B12 deficiency.  Last checked in March.  Giving B12 injection today with instructions to come by in 2 weeks to have B12 level drawn.  Patient verbalized understanding and is agreeable to the plan. - cyanocobalamin (VITAMIN B12) injection 1,000 mcg  3. Depression with anxiety Continues to have significant issues with anxiety and depression.  She is seeing Dr. Gilmore Laroche with psychiatry who is managing her medications.  4. BMI 32.0-32.9,adult Has been working with healthy weight and wellness with an attempt to lose weight.  Has not had any results so far and is very discouraged.  Would like to look over her medication list to determine if there are any medications that could be contributing to inhibiting her weight loss.  We did look them over and that most of her mental health medications have the possibility to affect weight but unable to determine which of them may be an issue.  It may be that there are no medications that are hindering the situation.  She admits to stress eating, mostly in the evening.  Has also been eating at night and is aware  of her behavior however feels that she is watching someone else when this occurs rather than feeling that she has control to stop it.  Suspect that the nighttime eating and snacking is sabotaging her results at this point.  Plan to look into options that may limit this outside of her current medications.  Plan to forward this information over to healthy weight and wellness for their input.  Procedures performed this visit: None.  Return in about 6 months (around 03/17/2024) for chronic disease follow up.  __________________________________ Thayer Ohm, DNP, APRN, FNP-BC Primary Care and Sports Medicine [facility]

## 2023-09-17 NOTE — Telephone Encounter (Signed)
Mychart message sent to patient.

## 2023-09-18 ENCOUNTER — Telehealth: Payer: Self-pay | Admitting: Sports Medicine

## 2023-09-18 NOTE — Telephone Encounter (Signed)
[employer] approval please, right knee only.

## 2023-09-19 NOTE — Telephone Encounter (Signed)
PA information submitted via [email or web address] for [employer] Paperwork has been printed and given to Dr. T for signatures. Once obtained, information will be faxed to [employer] at [phone]  

## 2023-09-20 DIAGNOSIS — E792 Myoadenylate deaminase deficiency: Secondary | ICD-10-CM | POA: Insufficient documentation

## 2023-09-20 NOTE — Addendum Note (Signed)
Addended byChristen Butter on: 09/20/2023 08:00 AM   Modules accepted: Orders

## 2023-09-21 NOTE — Progress Notes (Signed)
Office Visit Note  Patient: Heather Thompson             Date of Birth: 1951-07-01           MRN: [id number]             PCP: Christen Butter, NP Referring: Christen Butter, NP Visit Date: 10/05/2023 Occupation: @GUAROCC @  Subjective:  Pain in multiple joints  History of Present Illness: Heather Thompson is a 72 y.o. female  returns today after her initial visit on March 07, 2022.  At that visit patient complained of pain in multiple joints for many years.  She had a longstanding diagnosis of osteoarthritis and fibromyalgia.  She was followed by a rheumatologist in [place] where all the autoimmune workup was negative.  She also had repeat autoimmune workup in January 2023 which was negative.  She also has osteoarthritis involving multiple joints including her hands, and knee joints.  She had bilateral total hip replacement.  She also had chronic pain due to degenerative disc disease of cervical and lumbar spine.  She gives history of Raynauds.  She had generalized discomfort from fibromyalgia.  At the last visit she was advised to return for follow-up on a as needed basis. Patient states last year she was evaluated by an orthopedic surgeon and underwent right carpal tunnel release and right [facility] surgery in July 2023 and left carpal tunnel release and left [facility] surgery in October 2023.  He did not notice any improvement and continues to have discomfort.  She went back to the orthopedic surgeon who advised further surgery but patient declined as she lives independently by herself.  She also has been told that she has severe osteoarthritis in her knee joints.  She gets cortisone injections.  She is awaiting viscosupplement injections.  She continues to have lower back pain and discomfort.  She had spinal cord stimulator insertion onset in September 2024.  Which helped to some some extent.  She is also planning to get an external stimulator for her cervical spine in the near future.  She states her PCP placed her on  gabapentin 300 mg 3 times daily she has not noticed great improvement.  She is also concerned about weight gain from gabapentin.  She states she is been experiencing Raynaud's phenomenon in her hands and her feet.  Activities of Daily Living:  Patient reports morning stiffness for all day. Patient Reports nocturnal pain.  Difficulty dressing/grooming: Denies Difficulty climbing stairs: Reports Difficulty getting out of chair: Reports Difficulty using hands for taps, buttons, cutlery, and/or writing: Reports  Review of Systems  Constitutional:  Positive for fatigue.  HENT:  Positive for mouth dryness. Negative for mouth sores.   Eyes:  Positive for dryness.  Respiratory:  Negative for difficulty breathing.        On exertion   Cardiovascular:  Negative for chest pain and palpitations.  Gastrointestinal:  Negative for blood in stool, constipation and diarrhea.  Endocrine: Negative for increased urination.  Genitourinary:  Positive for nocturia. Negative for involuntary urination.  Musculoskeletal:  Positive for joint pain, gait problem, joint pain, joint swelling, myalgias, muscle weakness, morning stiffness, muscle tenderness and myalgias.  Skin:  Positive for color change. Negative for rash, hair loss and sensitivity to sunlight.  Allergic/Immunologic: Negative for susceptible to infections.  Neurological:  Positive for dizziness and numbness. Negative for headaches.  Hematological:  Negative for swollen glands.  Psychiatric/Behavioral:  Positive for depressed mood and sleep disturbance. The patient is nervous/anxious.  PMFS History:  Patient Active Problem List   Diagnosis Date Noted   Palpitations 10/04/2023   Cardiac murmur 10/04/2023   Allergy    Anemia    Anxiety    B12 deficiency    Back pain    Chronic fatigue syndrome    Clotting disorder (HCC)    Constipation    Edema of both lower extremities    Fatty liver    Fibromyalgia    Gallbladder problem    High  cholesterol    History of blood clots    History of Roux-en-Y gastric bypass    Hypothyroidism    Infertility, female    Joint pain    Kidney stones    Muscle disorder    Osteoarthritis    Prediabetes    Salzmann nodular degeneration    SOBOE (shortness of breath on exertion)    Swallowing difficulty    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   Arthritis of scaphoid-trapezium-trapezoid joint of right hand 09/10/2023   Urge incontinence 08/28/2023   OAB (overactive bladder) 08/28/2023   Lumbar post-laminectomy syndrome 07/06/2023   Snapping hip syndrome, 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   Other myositis, left thigh 04/24/2023   Sacroiliitis, not elsewhere classified (HCC) 04/24/2023   Dysphagia 04/17/2023   Elevated liver enzymes 04/02/2023   Elevated TSH 04/02/2023   Chronic throat clearing 03/14/2023   Globus sensation 03/14/2023   Hoarseness 03/14/2023   Muscle tension dysphonia 03/12/2023   Unspecified inflammatory spondylopathy, 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   Primary osteoarthritis of both knees 12/21/2021   Port-A-Cath in place 07/29/2021   Acquired hypothyroidism 07/29/2021   Chronic fatigue 07/29/2021   Vitamin B12 deficiency 07/29/2021   Vitamin D deficiency 07/29/2021   Postmenopausal estrogen deficiency 07/29/2021   IBS (irritable bowel syndrome) 04/06/2021   Retention of urine 04/06/2021   Herniated lumbar intervertebral disc 11/12/2020   Skin sensation disturbance 11/12/2020   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   Spondylopathy, unspecified 11/12/2020   Trochanteric bursitis of both hips 11/19/2019   Low serum iron 04/23/2019   Osteomalacia 04/23/2019   Abnormality of gait 04/23/2019   Presence of right artificial hip joint 06/26/2018   Gastroesophageal reflux disease 06/18/2018   Hypertension 06/18/2018   Intertrigo 04/30/2018   Lentigines 04/30/2018   Hyponatremia 01/17/2018   History of left hip replacement 01/14/2018   History of cervical  spinal surgery 01/01/2018   Obesity (BMI 30-39.9) 01/01/2018   SK (seborrheic keratosis) 04/18/2017   Pain management contract agreement 11/17/2016   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   Allergic rhinitis due to animal hair and dander 12/02/2015   Low back pain 03/03/2015   Incontinence 10/30/2013   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   Neutrophilic leukocytosis 08/12/2012   Thrombocytosis 08/12/2012   Osteoporosis, senile 11/15/2010   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   Hyperlipidemia 08/19/2010   Impaired fasting glucose 08/19/2010   Polyarthritis 09/28/2009   Degenerative disc disease 04/07/2009   Depression with anxiety 04/07/2009   Myopathy 04/07/2009   Insomnia 04/07/2009   Migraine 04/07/2009   Scoliosis 04/07/2009   Seasonal allergies 04/07/2009   Vitamin B12 deficiency anemia 04/07/2009    Past Medical History:  Diagnosis Date   Abnormality of gait 04/23/2019   Acquired hypothyroidism 07/29/2021   Allergic rhinitis due to animal hair and dander 12/02/2015   Formatting of this note might be different from the original.  [facility]  Seen by Dr. Rockwell Germany 11/11/15     Plan- Received allergy shots today. Follow up 1 year.  Allergy    Anemia    Anemia    Anxiety    Arthritis of scaphoid-trapezium-trapezoid joint of right hand 09/10/2023   B12 deficiency    Back pain    Chronic fatigue 07/29/2021   Chronic fatigue syndrome    Chronic throat clearing 03/14/2023   Clotting disorder (HCC)    Constipation    Depression with anxiety 04/07/2009   Formatting of this note might be different from the original.  well controlled on current meds, follows with psychiatrist in [place]. Had a   h/o severe depression 5 years ago, and had ECT.     Dysphagia 04/17/2023   Edema of both lower extremities    Elevated liver enzymes 04/02/2023   Elevated TSH 04/02/2023   Fatty liver    Fibromyalgia    Gallbladder problem    Gastroesophageal reflux disease 06/18/2018   Globus  sensation 03/14/2023   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   Formatting of this note might be different from the original.  B12 deficiency   Iron deficiency     Herniated lumbar intervertebral disc 11/12/2020   High cholesterol    History of blood clots    History of cervical spinal surgery 01/01/2018   Formatting of this note might be different from the original.  Fusion C6 and C7 per 2018 MRI report.     History of left hip replacement 01/14/2018   History of Roux-en-Y gastric bypass    Hoarseness 03/14/2023   Hyperlipidemia 08/19/2010   Hypertension    Hyponatremia 01/17/2018   Hypothyroidism    Hypothyroidism    IBS (irritable bowel syndrome) 04/06/2021   Impaired fasting glucose 08/19/2010   Incontinence 10/30/2013   Formatting of this note might be different from the original. Formatting of this note might be different from the original. Urinary Incontinence     Infertility, female    Insomnia 04/07/2009   Formatting of this note might be different from the original.  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  Followed by [facility]  Seen by Dr. Reginia Naas 03/01/15     S/p changing thoracic spinal cord stimulator generator 08/18/14.     Lumbar spine films show spinal cord generator in left flank area. 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  Follows with Rheumatology in [facility] of this note  might be different from the original.  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  Getting iron infusion via port at hematalogy/oncology center at [facility].   Was recently hospitalized in [facility] for severe anemia     Formatting of this note might be different from the original.  on OTC b12 currently     Vitamin D deficiency      Family History  Problem Relation Age of Onset   Hyperlipidemia Mother    Hypertension Mother    Heart attack Mother    Heart disease Mother    Sudden death Mother    Depression Mother    Anxiety disorder Mother    Obesity Mother    Anxiety disorder Father    Depression Father    Cancer Father    Hyperlipidemia Father    Hypertension Father    Lung cancer Father    Prostate cancer Father    Kidney cancer Brother    Healthy Daughter    Healthy Son    Past Surgical History:  Procedure Laterality Date   ABDOMINAL HYSTERECTOMY     APPENDECTOMY     CARPAL TUNNEL RELEASE Bilateral 2023   with thumb joint replacement   CERVICAL FUSION     CHOLECYSTECTOMY     FOOT SURGERY Bilateral    fusion   GASTRIC BYPASS     SPINAL CORD STIMULATOR INSERTION  07/2023   TOTAL HIP ARTHROPLASTY Bilateral    11/2016 & 05/2017   Social History   Social History Narrative   Lives alone. She has two children and 4 grandchildren. She stays active, enjoys doing crafts and painting.    Immunization History  Administered Date(s) Administered   Fluad Quad(high Dose 65+) 07/29/2021, 08/07/2022   H1N1 12/04/2008   Influenza Split 08/22/2012   Influenza Whole 10/04/2007, 09/03/2008, 09/07/2009   Influenza, High Dose Seasonal PF 08/13/2015, 08/25/2020   Influenza,inj,Quad PF,6+ Mos 08/29/2016   Influenza-Unspecified 08/31/2017, 09/03/2018, 09/04/2019, 09/10/2023   PFIZER(Purple Top)SARS-COV-2 Vaccination 01/24/2020, 02/21/2020, 12/10/2020, 09/18/2022   PPD Test 06/12/2007, 10/17/2012, 10/28/2012, 08/29/2013, 09/08/2013   Pfizer(Comirnaty)Fall Seasonal Vaccine 12 years and older 09/18/2022   Pneumococcal Conjugate-13 09/13/2015   Pneumococcal Polysaccharide-23 10/04/2007, 12/01/2011, 02/21/2017   Respiratory Syncytial Virus Vaccine,Recomb Aduvanted(Arexvy) 09/18/2022   Tdap 11/28/1996, 06/09/2009, 01/23/2023   Zoster, Live 10/06/2013     Objective: Vital Signs: BP 121/80 (BP Location: Left Arm,  Patient Position: Sitting, Cuff Size: Large)   Pulse 77   Resp 14   Ht  5\' 4"  (1.626 m)   Wt 180 lb 12.8 oz (82 kg)   BMI 31.03 kg/m    Physical Exam Vitals and nursing note reviewed.  Constitutional:      Appearance: She is well-developed.  HENT:     Head: Normocephalic and atraumatic.  Eyes:     Conjunctiva/sclera: Conjunctivae normal.  Cardiovascular:     Rate and Rhythm: Normal rate and regular rhythm.     Heart sounds: Normal heart sounds.  Pulmonary:     Effort: Pulmonary effort is normal.     Breath sounds: Normal breath sounds.  Abdominal:     General: Bowel sounds are normal.     Palpations: Abdomen is soft.  Musculoskeletal:     Cervical back: Normal range of motion.  Lymphadenopathy:     Cervical: No cervical adenopathy.  Skin:    General: Skin is warm and dry.     Capillary Refill: Capillary refill takes less than 2 seconds.  Neurological:     Mental Status: She is alert and oriented to person, place, and time.  Psychiatric:        Behavior: Behavior normal.      Musculoskeletal Exam: Patient had limited lateral rotation of the cervical spine with discomfort.  She has severe thoracolumbar scoliosis with limited painful range of motion of her thoracic and lumbar spine.  Shoulder joints and elbow joints were in good range of motion.  Wrist joints were in good range of motion without any synovitis.  She is surgical scar from [facility] surgery.  Bilateral PIP and DIP thickening with incomplete fist formation was noted.  She had fusion of several of her PIP and DIP joints.  No synovitis was noted.  Hip joints were in good range of motion.  Knee joints were in good range of motion without any warmth swelling or effusion.  There was no tenderness over ankles.  She had PIP and DIP thickening in her feet but no synovitis.  No plantar fasciitis or Achilles tendinitis was noted.  CDAI Exam: CDAI Score: -- Patient Global: --; Provider Global: -- Swollen: --; Tender: -- Joint  Exam 10/05/2023   No joint exam has been documented for this visit   There is currently no information documented on the homunculus. Go to the Rheumatology activity and complete the homunculus joint exam.  Investigation: No additional findings.  Imaging: No results found.  Recent Labs: Lab Results  Component Value Date   WBC 5.2 02/14/2023   HGB 12.0 02/14/2023   PLT 200 02/14/2023   NA 136 08/14/2023   K 4.8 08/14/2023   CL 95 (L) 08/14/2023   CO2 26 08/14/2023   GLUCOSE 101 (H) 08/14/2023   BUN 17 08/14/2023   CREATININE 0.68 08/14/2023   BILITOT 0.4 08/14/2023   ALKPHOS 107 08/14/2023   AST 30 08/14/2023   ALT 29 08/14/2023   PROT 6.9 08/14/2023   ALBUMIN 4.2 08/14/2023   CALCIUM 9.6 08/14/2023   12/21/21: ANA negative, complements WNL, dsDNA 1, ribosomal P<1, SM ab<1, Ro-, La-, TPO-, Scl-70-, RF<14, CK 60, uric acid 4.1, ESR 14, Anti-CCP<16   Speciality Comments: No specialty comments available.  Procedures:  No procedures performed Allergies: Compazine [prochlorperazine], Phenothiazines, Pregabalin, and Prednisone   Assessment / Plan:     Visit Diagnoses: Polyarthralgia - History of pain and joints and muscles for many years.  All autoimmune workup negative in the past.12/21/21: ANA negative, complements WNL, dsDNA 1, ribosomal P<1, SM ab<1, Ro-, La-, TPO-, Scl-70-, RF<14, CK 60,  uric acid 4.1, ESR 14, Anti-CCP<16   Scalp psoriasis - She uses topical agents and shampoo.  She has not had a scalp psoriasis in many years.  Raynaud's disease without gangrene - History of Raynauds for many years.  She states her Raynaud's symptoms have been more active in the colder weather.  No sclerodactyly, nailbed capillary changes or decreased capillary refill were noted.  She is on propranolol which can make Raynauds worse.  She may benefit from a calcium channel blocker.  She will discuss this further with her cardiologist and PCP.  Keeping core temperature warm and warm clothing  was advised.  Primary osteoarthritis of both hands -she has severe end-stage osteoarthritis of her hands.  Previous x-rays from 2023 showed severe osteoarthritic changes.  X-ray findings were reviewed with the patient.  She did not notice any benefit from bilateral [facility] surgery.  She may benefit from occupational therapy.  I gave her a handout on hand exercises.  I will refer her to occupational therapy.  Detail counseled regarding osteoarthritis was provided and a handout was given.  History of bilateral hip replacements-she good range of motion of bilateral hips.  Primary osteoarthritis of both knees - Previous x-rays from 2023 showed moderate osteoarthritis and moderate chondromalacia patella of the left knee and severe osteoarthritis and moderate chondromalacia patella of the right knee.  Patient states she has been getting cortisone injections by the orthopedic surgeon and also planning to get viscosupplement injections.  I reviewed the x-ray findings with her.  A handout on lower extremity muscle strengthening exercises were discussed.  She is not planning to have total knee replacement as she lives by herself.  DDD (degenerative disc disease), cervical -she continues to have neck pain and stiffness.  She is planning to get a stim unit.  Multilevel spondylosis and facet joint arthropathy was noted on the previous MRI.  Other idiopathic scoliosis, thoracolumbar region-chronic pain.  Lumbar spondylosis - Multilevel spondylosis and facet joint arthropathy was noted on the previous radiographic studies.  MRI findings were reviewed with the patient.  She has severe degenerative disc disease.  She had spinal stimulator unit placed which has helped her to some extent.  She was also placed on gabapentin by her PCP which she recently started.  Fibromyalgia-she continue to have generalized pain and discomfort.  She had hyperalgesia and positive tender points.  Chronic fatigue-related to insomnia and  fibromyalgia.  Osteoporosis, senile - August 31, 2021 showed a T score of -1.9 and BMD 0.710 and radius which was consistent with osteopenia.  Calcium rich diet and vitamin D was discussed.  She is getting DEXA scan by her PCP.  Other medical problems are listed as follows:  Myoadenylate deaminase deficiency (HCC)  Primary hypertension  History of hyperlipidemia  Gastroesophageal reflux disease without esophagitis  H/O gastric bypass  History of IBS  Hx of migraines  Postmenopausal estrogen deficiency  Acquired hypothyroidism  SK (seborrheic keratosis)  Port-A-Cath in place  Vitamin B12 deficiency  Vitamin D deficiency  Depression with anxiety  Orders: Orders Placed This Encounter  Procedures   Ambulatory referral to Occupational Therapy   No orders of the defined types were placed in this encounter.  .  Follow-Up Instructions: Return if symptoms worsen or fail to improve, for Osteoarthritis.   Pollyann Savoy, MD  Note - This record has been created using Animal nutritionist.  Chart creation errors have been sought, but may not always  have been located. Such creation errors do not reflect on  the standard of medical care.

## 2023-09-22 ENCOUNTER — Other Ambulatory Visit: Payer: Self-pay | Admitting: Medical-Surgical

## 2023-09-28 ENCOUNTER — Telehealth: Payer: Self-pay

## 2023-09-28 NOTE — Telephone Encounter (Signed)
For referrals, we will have to complete a visit detailing the issue as Neurology is refusing referrals without up to date office notes. Ok to be virtual or in person.

## 2023-09-28 NOTE — Telephone Encounter (Signed)
Patient came into office to inform that she saw Dr. Abran Cantor, ([facility]) and  reminded Dr. Oneal Grout of balance and dizzy issues, Patient was advised for her to see an Neurlogist or Cardiologist, please advise, thanks.

## 2023-09-29 LAB — VITAMIN B12: Vitamin B-12: >2000 pg/mL — ABNORMAL HIGH (ref 232–1245)

## 2023-10-01 ENCOUNTER — Encounter: Payer: Self-pay | Admitting: Nurse Practitioner

## 2023-10-01 ENCOUNTER — Ambulatory Visit (INDEPENDENT_AMBULATORY_CARE_PROVIDER_SITE_OTHER): Payer: Medicare Other | Admitting: Nurse Practitioner

## 2023-10-01 VITALS — BP 135/78 | HR 78 | Temp 98.2°F | Ht 64.0 in | Wt 182.0 lb

## 2023-10-01 DIAGNOSIS — Z6831 Body mass index (BMI) 31.0-31.9, adult: Secondary | ICD-10-CM | POA: Diagnosis not present

## 2023-10-01 DIAGNOSIS — E669 Obesity, unspecified: Secondary | ICD-10-CM | POA: Diagnosis not present

## 2023-10-01 DIAGNOSIS — R638 Other symptoms and signs concerning food and fluid intake: Secondary | ICD-10-CM | POA: Diagnosis not present

## 2023-10-01 MED ORDER — LIRAGLUTIDE 18 MG/3ML ~~LOC~~ SOPN: 0.6000 mg | PEN_INJECTOR | Freq: Every day | SUBCUTANEOUS | 0 refills | Status: DC

## 2023-10-01 NOTE — Progress Notes (Signed)
Office: [phone]  /  Fax: [phone]  WEIGHT SUMMARY AND BIOMETRICS  Weight Lost Since Last Visit: 6lb  Weight Gained Since Last Visit: 0lb   Vitals Temp: 98.2 F (36.8 C) BP: 135/78 Pulse Rate: 78 SpO2: 97 %   Anthropometric Measurements Height: 5\' 4"  (1.626 m) Weight: 182 lb (82.6 kg) BMI (Calculated): 31.22 Weight at Last Visit: 188lb Weight Lost Since Last Visit: 6lb Weight Gained Since Last Visit: 0lb Starting Weight: 178lb Total Weight Loss (lbs): 0 lb (0 kg)   Body Composition  Body Fat %: 47 % Fat Mass (lbs): 85.6 lbs Muscle Mass (lbs): 91.6 lbs Total Body Water (lbs): 72.2 lbs Visceral Fat Rating : 14   Other Clinical Data Fasting: No Labs: No Today's Visit #: 7 Starting Date: 03/27/23     HPI  Chief Complaint: OBESITY  Heather Thompson is here to discuss her progress with her obesity treatment plan. She is on the the Category 2 Plan and states she is following her eating plan approximately 50 % of the time. She states she is exercising 30 minutes 1 days per week.   Interval History:  Since last office visit she has lost 6 pounds. Since starting Heather Thompson she finds she is feeling full and is skipping breakfast and sometimes lunch-has to make herself eat breakfast and lunch.  Finds that she starting to snack around 8-11pm. She is doing her injection around 10 pm.  She denies polyphagia, reports cravings.  She eats out a couple days per week.  She is drinking water daily.  Denies sugary drinks.    BF 11am:  protein shake or cheese or cereal   Snack:  none Lunch 2-3pm:  salad with chicken or cheese sticks or fruit or tuna fish sandwich Snack:  protein bar or fruit or cheese sticks Dinner 6-7pm:  salad with chicken (sometimes skips) or fruit or cheese or outshine bar Snacks:  outshine bar or cheese sticks or protein bar or sugar free jello (struggles with continued snacking until she goes to bed)  Denies night awakening to eat since starting Heather Thompson.    Has times of feeling like an out of body experience with night time eating.  Like she is watching herself eating.  This has improved since starting Heather Thompson.     Pharmacotherapy for weight loss: She is currently taking  Heather Thompson 0.6mg  x 2 weeks  off label for medical weight loss.  Denies side effects.      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 Her highest weight was 267 lbs and nadir weight was 154 lbs. Her weight has fluctuated over the years. Highest weight since having surgery was 216 lbs.    PHYSICAL EXAM:  Blood pressure 135/78, pulse 78, temperature 98.2 F (36.8 C), height 5\' 4"  (1.626 m), weight 182 lb (82.6 kg), SpO2 97%. Body mass index is 31.24 kg/m.  General: She is overweight, cooperative, alert, well developed, and in no acute distress. PSYCH: Has normal mood, affect and thought process.   Extremities: No edema.  Neurologic: No gross sensory or motor deficits. No tremors or fasciculations noted.    DIAGNOSTIC DATA REVIEWED:  BMET    Component Value Date/Time   NA 136 08/14/2023 1357   K 4.8 08/14/2023 1357   CL 95 (L) 08/14/2023 1357   CO2 26 08/14/2023 1357   GLUCOSE 101 (H) 08/14/2023 1357   GLUCOSE 92 02/20/2023 1128   BUN 17  08/14/2023 1357   CREATININE 0.68 08/14/2023 1357   CREATININE 0.80 02/20/2023 1128   CALCIUM 9.6 08/14/2023 1357   GFRNONAA >60 09/07/2021 1433   Lab Results  Component Value Date   HGBA1C 6.1 (A) 08/14/2023   HGBA1C 6.1 08/14/2023   HGBA1C 6.0 (H) 04/06/2022   Lab Results  Component Value Date   INSULIN 8.7 03/27/2023   Lab Results  Component Value Date   TSH 4.550 (H) 03/27/2023   CBC    Component Value Date/Time   WBC 5.2 02/14/2023 1352   WBC 4.7 12/19/2022 1157   RBC 3.99 02/14/2023 1352   RBC 3.97 02/14/2023 1351   HGB 12.0 02/14/2023 1352   HCT 36.7 02/14/2023 1352   PLT 200 02/14/2023 1352   MCV 92.0 02/14/2023 1352   MCH  30.1 02/14/2023 1352   MCHC 32.7 02/14/2023 1352   RDW 13.0 02/14/2023 1352   Iron Studies    Component Value Date/Time   IRON 93 02/14/2023 1352   TIBC 392 02/14/2023 1352   FERRITIN 123 02/14/2023 1352   IRONPCTSAT 24 02/14/2023 1352   IRONPCTSAT 25 12/19/2022 1157   Lipid Panel     Component Value Date/Time   CHOL 213 (H) 02/20/2023 1128   TRIG 106 02/20/2023 1128   HDL 89 02/20/2023 1128   CHOLHDL 2.4 02/20/2023 1128   LDLCALC 104 (H) 02/20/2023 1128   Hepatic Function Panel     Component Value Date/Time   PROT 6.9 08/14/2023 1357   ALBUMIN 4.2 08/14/2023 1357   AST 30 08/14/2023 1357   AST 30 09/07/2021 1433   ALT 29 08/14/2023 1357   ALT 31 09/07/2021 1433   ALKPHOS 107 08/14/2023 1357   BILITOT 0.4 08/14/2023 1357   BILITOT 0.3 09/07/2021 1433   BILIDIR 0.12 04/10/2023 1624      Component Value Date/Time   TSH 4.550 (H) 03/27/2023 0957   Nutritional Lab Results  Component Value Date   VD25OH 46.8 03/27/2023   VD25OH 78 07/10/2022     ASSESSMENT AND PLAN  TREATMENT PLAN FOR OBESITY:  Recommended Dietary Goals  Heather Thompson is currently in the action stage of change. As such, her goal is to continue weight management plan. She has agreed to the Category 2 Plan.  Behavioral Intervention  We discussed the following Behavioral Modification Strategies today: increasing lean protein intake to established goals, avoiding skipping meals, increasing water intake , work on meal planning and preparation, planning for success, and continue to work on maintaining a reduced calorie state, getting the recommended amount of protein, incorporating whole foods, making healthy choices, staying well hydrated and practicing mindfulness when eating..  Additional resources provided today: snack ideas 100 & 200, protein content of foods  Recommended Physical Activity Goals  Heather Thompson has been advised to work up to 150 minutes of moderate intensity aerobic activity a week and  strengthening exercises 2-3 times per week for cardiovascular health, weight loss maintenance and preservation of muscle mass.   She has agreed to Think about enjoyable ways to increase daily physical activity and overcoming barriers to exercise, Increase physical activity in their day and reduce sedentary time (increase NEAT)., Increase the intensity, frequency or duration of strengthening exercises , and Increase the intensity, frequency or duration of aerobic exercises     Pharmacotherapy We discussed various medication options to help [facility] with her weight loss efforts and we both agreed to continue Heather Thompson 0.6mg  off label for weight loss.  Side effects discussed.  To move her injection  to 7 pm.  Hopefully will continue to help with nighttime awaking and snacking and hopefully to help with evening snacking.   ASSOCIATED CONDITIONS ADDRESSED TODAY  Action/Plan  Abnormal craving -     Liraglutide; Inject 0.6 mg into the skin daily.  Dispense: 3 mL; Refill: 0  Generalized obesity -     Liraglutide; Inject 0.6 mg into the skin daily.  Dispense: 3 mL; Refill: 0  BMI 31.0-31.9,adult       Refer to Dr. Dewaine Conger   I spent 40 minutes with the patient, reviewing her chart before and after her visit.    Return in about 3 weeks (around 10/22/2023).Marland Kitchen She was informed of the importance of frequent follow up visits to maximize her success with intensive lifestyle modifications for her multiple health conditions.   ATTESTASTION STATEMENTS:  Reviewed by clinician on day of visit: allergies, medications, problem list, medical history, surgical history, family history, social history, and previous encounter notes.   Theodis Sato. Bowman Higbie FNP-C

## 2023-10-02 ENCOUNTER — Ambulatory Visit (HOSPITAL_COMMUNITY): Payer: Medicare Other | Admitting: Psychiatry

## 2023-10-02 ENCOUNTER — Ambulatory Visit (INDEPENDENT_AMBULATORY_CARE_PROVIDER_SITE_OTHER): Payer: Medicare Other | Admitting: Medical-Surgical

## 2023-10-02 ENCOUNTER — Encounter: Payer: Self-pay | Admitting: Medical-Surgical

## 2023-10-02 ENCOUNTER — Other Ambulatory Visit: Payer: Self-pay | Admitting: Medical-Surgical

## 2023-10-02 VITALS — BP 123/68 | HR 74 | Resp 20 | Ht 64.0 in | Wt 182.0 lb

## 2023-10-02 DIAGNOSIS — R42 Dizziness and giddiness: Secondary | ICD-10-CM

## 2023-10-02 DIAGNOSIS — E538 Deficiency of other specified B group vitamins: Secondary | ICD-10-CM | POA: Diagnosis not present

## 2023-10-02 DIAGNOSIS — R2681 Unsteadiness on feet: Secondary | ICD-10-CM

## 2023-10-02 NOTE — Progress Notes (Signed)
        Established patient visit  History, exam, impression, and plan:  1. Dizziness 2. Unsteady Pleasant 72 year old female presenting today with reports of prolonged issues with dizziness that affect her on a daily basis.  The dizziness has has begun to affect her physical ability and she often has to pause and will eat when going from sitting to standing in order to center herself before she starts walking.  While she is walking, she has noticed that she is very unbalanced and at times tips over without provocation.  Sometimes she bumps into other people that are nearby.  When she is bending over, she notes that she has to rise to standing very slowly due to feeling dizzy for couple of seconds.  Does not have any issues with dizziness while lying down and has not been able to figure out aggravating or alleviating factors.  She was recently seen by her pain management provider who recommended she be seen by neurology and/or cardiology.  She is interested in further evaluation by both of the specialists.  On exam, her gait is mildly abnormal and she does have to rise from sitting to standing slowly before starting to walk.  She does have a family history of heart disease and her mother passed at age 74 years from an MI.  Has never been evaluated by cardiology.  EKG on file from 02/2023 showing sinus rhythm.  No concerning findings on exam today.  Referrals placed for further evaluation. - Ambulatory referral to Neurology - Ambulatory referral to Cardiology  3. Vitamin B12 deficiency Long history of vitamin B12 deficiency and has been on monthly injections for the last 28 years.  Early in her injection history, she was able to tell a difference when she got her dose but over the last several years, this feeling is faded.  Most recent B12 evaluation showed levels greater than 2000 midway between injections.  Would like to try holding injections then reevaluating.  Okay with a 66-month trial with this.   Advised to cancel her upcoming nurse visit and we will plan to reevaluate her vitamin B12 at the 15-month point.   Procedures performed this visit: None.  Return if symptoms worsen or fail to improve.  __________________________________ Thayer Ohm, DNP, APRN, FNP-BC Primary Care and Sports Medicine [facility] [place]

## 2023-10-03 DIAGNOSIS — K5900 Constipation, unspecified: Secondary | ICD-10-CM | POA: Insufficient documentation

## 2023-10-03 DIAGNOSIS — M629 Disorder of muscle, unspecified: Secondary | ICD-10-CM | POA: Insufficient documentation

## 2023-10-03 DIAGNOSIS — R1310 Dysphagia, unspecified: Secondary | ICD-10-CM | POA: Insufficient documentation

## 2023-10-03 DIAGNOSIS — H18459 Nodular corneal degeneration, unspecified eye: Secondary | ICD-10-CM | POA: Insufficient documentation

## 2023-10-03 DIAGNOSIS — G9332 Myalgic encephalomyelitis/chronic fatigue syndrome: Secondary | ICD-10-CM | POA: Insufficient documentation

## 2023-10-03 DIAGNOSIS — E7800 Pure hypercholesterolemia, unspecified: Secondary | ICD-10-CM | POA: Insufficient documentation

## 2023-10-03 DIAGNOSIS — D649 Anemia, unspecified: Secondary | ICD-10-CM | POA: Insufficient documentation

## 2023-10-03 DIAGNOSIS — N979 Female infertility, unspecified: Secondary | ICD-10-CM | POA: Insufficient documentation

## 2023-10-03 DIAGNOSIS — R7303 Prediabetes: Secondary | ICD-10-CM | POA: Insufficient documentation

## 2023-10-03 DIAGNOSIS — Z86718 Personal history of other venous thrombosis and embolism: Secondary | ICD-10-CM | POA: Insufficient documentation

## 2023-10-03 DIAGNOSIS — Z9884 Bariatric surgery status: Secondary | ICD-10-CM | POA: Insufficient documentation

## 2023-10-03 DIAGNOSIS — F419 Anxiety disorder, unspecified: Secondary | ICD-10-CM | POA: Insufficient documentation

## 2023-10-03 DIAGNOSIS — R0602 Shortness of breath: Secondary | ICD-10-CM | POA: Insufficient documentation

## 2023-10-03 DIAGNOSIS — R600 Localized edema: Secondary | ICD-10-CM | POA: Insufficient documentation

## 2023-10-03 DIAGNOSIS — M2550 Pain in unspecified joint: Secondary | ICD-10-CM | POA: Insufficient documentation

## 2023-10-03 DIAGNOSIS — M797 Fibromyalgia: Secondary | ICD-10-CM | POA: Insufficient documentation

## 2023-10-03 DIAGNOSIS — E538 Deficiency of other specified B group vitamins: Secondary | ICD-10-CM | POA: Insufficient documentation

## 2023-10-03 DIAGNOSIS — E039 Hypothyroidism, unspecified: Secondary | ICD-10-CM | POA: Insufficient documentation

## 2023-10-03 DIAGNOSIS — K760 Fatty (change of) liver, not elsewhere classified: Secondary | ICD-10-CM | POA: Insufficient documentation

## 2023-10-03 DIAGNOSIS — T7840XA Allergy, unspecified, initial encounter: Secondary | ICD-10-CM | POA: Insufficient documentation

## 2023-10-03 DIAGNOSIS — M1990 Unspecified osteoarthritis, unspecified site: Secondary | ICD-10-CM | POA: Insufficient documentation

## 2023-10-03 DIAGNOSIS — N200 Calculus of kidney: Secondary | ICD-10-CM | POA: Insufficient documentation

## 2023-10-03 DIAGNOSIS — K829 Disease of gallbladder, unspecified: Secondary | ICD-10-CM | POA: Insufficient documentation

## 2023-10-03 DIAGNOSIS — D689 Coagulation defect, unspecified: Secondary | ICD-10-CM | POA: Insufficient documentation

## 2023-10-03 DIAGNOSIS — M549 Dorsalgia, unspecified: Secondary | ICD-10-CM | POA: Insufficient documentation

## 2023-10-04 ENCOUNTER — Other Ambulatory Visit (HOSPITAL_COMMUNITY): Payer: Self-pay | Admitting: Psychiatry

## 2023-10-04 ENCOUNTER — Ambulatory Visit: Payer: Medicare Other | Attending: Cardiology | Admitting: Cardiology

## 2023-10-04 ENCOUNTER — Encounter: Payer: Self-pay | Admitting: Cardiology

## 2023-10-04 ENCOUNTER — Ambulatory Visit: Payer: Medicare Other

## 2023-10-04 ENCOUNTER — Ambulatory Visit (INDEPENDENT_AMBULATORY_CARE_PROVIDER_SITE_OTHER): Payer: Medicare Other | Admitting: Psychiatry

## 2023-10-04 ENCOUNTER — Encounter (HOSPITAL_COMMUNITY): Payer: Self-pay | Admitting: Psychiatry

## 2023-10-04 VITALS — BP 132/83 | HR 65 | Ht 64.0 in | Wt 182.0 lb

## 2023-10-04 VITALS — BP 146/80 | HR 87 | Ht 64.0 in | Wt 181.1 lb

## 2023-10-04 DIAGNOSIS — F411 Generalized anxiety disorder: Secondary | ICD-10-CM | POA: Diagnosis not present

## 2023-10-04 DIAGNOSIS — R002 Palpitations: Secondary | ICD-10-CM | POA: Insufficient documentation

## 2023-10-04 DIAGNOSIS — F322 Major depressive disorder, single episode, severe without psychotic features: Secondary | ICD-10-CM

## 2023-10-04 DIAGNOSIS — F4321 Adjustment disorder with depressed mood: Secondary | ICD-10-CM

## 2023-10-04 DIAGNOSIS — F332 Major depressive disorder, recurrent severe without psychotic features: Secondary | ICD-10-CM | POA: Diagnosis not present

## 2023-10-04 DIAGNOSIS — F5102 Adjustment insomnia: Secondary | ICD-10-CM

## 2023-10-04 DIAGNOSIS — E785 Hyperlipidemia, unspecified: Secondary | ICD-10-CM | POA: Insufficient documentation

## 2023-10-04 DIAGNOSIS — I10 Essential (primary) hypertension: Secondary | ICD-10-CM | POA: Diagnosis present

## 2023-10-04 DIAGNOSIS — R011 Cardiac murmur, unspecified: Secondary | ICD-10-CM | POA: Insufficient documentation

## 2023-10-04 MED ORDER — LISINOPRIL 10 MG PO TABS: 10.0000 mg | ORAL_TABLET | Freq: Every day | ORAL | 3 refills | Status: DC
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
## 2023-10-04 NOTE — Addendum Note (Signed)
Addended by: Eleonore Chiquito on: 10/04/2023 02:47 PM   Modules accepted: Orders

## 2023-10-04 NOTE — Progress Notes (Signed)
Cardiology Office Note:    Date:  10/04/2023   ID:  Heather Thompson, DOB Sep 28, 1951, MRN [id number]  PCP:  Christen Butter, NP  Cardiologist:  Garwin Brothers, MD   Referring MD: Christen Butter, NP    ASSESSMENT:    1. Hyperlipidemia, unspecified hyperlipidemia type   2. Primary hypertension   3. Palpitations   4. Cardiac murmur    PLAN:    In order of problems listed above:  Primary prevention stressed with the patient.  Importance of compliance with diet medication stressed and patient verbalized standing. Essential hypertension: I wonder whether she has an element of postural hypotension.  I have discontinued hydrochlorothiazide and she will only take lisinopril instead of the combination.  She will monitor her blood pressures and send it to [place] in a week.  Written instructions were given.  Fall precautions were advised. Palpitations: We will do a 2-week monitor to assess for any significant issues.  Her palpitations do not make her feel dizzy or any such issues. Cardiac murmur: Echocardiogram will be done to assess murmur heard on auscultation. Obesity: Weight reduction stressed diet emphasized and she promises to do better. Patient will be seen in follow-up appointment in 6 months or earlier if the patient has any concerns.    Medication Adjustments/Labs and Tests Ordered: Current medicines are reviewed at length with the patient today.  Concerns regarding medicines are outlined above.  Orders Placed This Encounter  Procedures   EKG 12-Lead   No orders of the defined types were placed in this encounter.    History of Present Illness:    Heather Thompson is a 72 y.o. female who is being seen today for the evaluation of palpitations and elevated blood pressure at the request of Christen Butter, NP.  Patient is a pleasant 72 year old female.  She has past medical history of essential hypertension.  She mentions to me that when she walks she is swaying to the right side and sometimes hitting  the wall or hitting people.  She has never had a falling episode.  For this reason she is referred to neurology and to cardiology.  No chest pain orthopnea or PND.  She gives history suggestive of the possibility of postural hypotension.  I could not elicit this today.  At the time of my evaluation, the patient is alert awake oriented and in no distress.  Past Medical History:  Diagnosis Date   Abnormality of gait 04/23/2019   Acquired hypothyroidism 07/29/2021   Allergic rhinitis due to animal hair and dander 12/02/2015   Formatting of this note might be different from the original.  [facility]  Seen by Dr. Rockwell Germany 11/11/15     Plan- Received allergy shots today. Follow up 1 year.     Allergy    Anemia    Anemia    Anxiety    Arthritis of scaphoid-trapezium-trapezoid joint of right hand 09/10/2023   B12 deficiency    Back pain    Chronic fatigue 07/29/2021   Chronic fatigue syndrome    Chronic throat clearing 03/14/2023   Clotting disorder (HCC)    Constipation    Depression with anxiety 04/07/2009   Formatting of this note might be different from the original.  well controlled on current meds, follows with psychiatrist in [place]. Had a   h/o severe depression 5 years ago, and had ECT.     Dysphagia 04/17/2023   Edema of both lower extremities    Elevated liver enzymes  04/02/2023   Elevated TSH 04/02/2023   Fatty liver    Fibromyalgia    Gallbladder problem    Gastroesophageal reflux disease 06/18/2018   Globus sensation 03/14/2023   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   Formatting of this note might be different from the original.  B12 deficiency   Iron deficiency     Herniated lumbar intervertebral disc 11/12/2020   High cholesterol    History of blood clots    History of cervical spinal surgery 01/01/2018   Formatting of this note might be different from the original.  Fusion C6 and C7 per 2018 MRI report.     History of left hip replacement 01/14/2018    History of Roux-en-Y gastric bypass    Hoarseness 03/14/2023   Hyperlipidemia 08/19/2010   Hypertension    Hyponatremia 01/17/2018   Hypothyroidism    Hypothyroidism    IBS (irritable bowel syndrome) 04/06/2021   Impaired fasting glucose 08/19/2010   Incontinence 10/30/2013   Formatting of this note might be different from the original. Formatting of this note might be different from the original. Urinary Incontinence     Infertility, female    Insomnia 04/07/2009   Formatting of this note might be different from the original.  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  Followed by [facility]  Seen by Dr. Reginia Naas 03/01/15     S/p changing thoracic spinal cord stimulator generator 08/18/14.     Lumbar spine films show spinal cord generator in left flank area. 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  Follows with Rheumatology in [facility] of this note might be different from the original.  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  Getting iron infusion via port at hematalogy/oncology center at [facility].   Was recently hospitalized in [facility] for severe anemia     Formatting of this note might be different from the original.  on OTC b12 currently     Vitamin D deficiency     Past Surgical History:  Procedure Laterality Date   ABDOMINAL HYSTERECTOMY     APPENDECTOMY     CERVICAL FUSION     CHOLECYSTECTOMY     FOOT SURGERY Bilateral    fusion   GASTRIC BYPASS     TOTAL HIP ARTHROPLASTY Bilateral    11/2016 & 05/2017    Current Medications: Current Meds  Medication Sig   buPROPion (WELLBUTRIN XL) 300 MG 24 hr tablet Take 1 tablet (300 mg total) by mouth daily.   desvenlafaxine (PRISTIQ) 100 MG 24 hr  tablet TAKE ONE TABLET BY MOUTH EVERY DAY   FLUoxetine (PROZAC) 10 MG capsule TAKE 1 CAPSULE BY MOUTH EVERY DAY   gabapentin (NEURONTIN) 300 MG capsule Take 1 capsule (300 mg total) by mouth 3 (three) times daily.   hydrOXYzine (ATARAX) 10 MG tablet Take 1 tablet (10 mg total) by mouth at bedtime.   liraglutide (VICTOZA) 18 MG/3ML SOPN Inject 0.6 mg into the skin daily.   lisinopril-hydrochlorothiazide (ZESTORETIC) 10-12.5 MG tablet Take 1 tablet by mouth daily.   Magnesium 250 MG TABS Take 1 tablet by mouth daily.   Multiple Vitamin (MULTI-VITAMIN) tablet Take 1 tablet by mouth daily.   nystatin (MYCOSTATIN/NYSTOP) powder APPLY TO THE AFFECTED AREA(S) EXTERNALLY TWICE DAILY   pravastatin (PRAVACHOL) 20 MG tablet TAKE 1 TABLET BY MOUTH EVERY DAY   propranolol (INDERAL) 20 MG tablet TAKE 1 TABLET(20 MG) BY MOUTH DAILY AS NEEDED   solifenacin (VESICARE) 5 MG tablet Take 1 tablet (5 mg total) by mouth daily.   sucralfate (CARAFATE) 1 g tablet Take 1 tablet (1 g total) by mouth 4 (four) times daily -  with meals and at bedtime.   temazepam (RESTORIL) 22.5 MG capsule TAKE ONE CAPSULE BY MOUTH AT NIGHT   tiZANidine (ZANAFLEX) 2 MG tablet TAKE 1 TABLET (2 MG TOTAL) BY MOUTH IN THE MORNING AND AT BEDTIME.     Allergies:   Compazine [prochlorperazine], Phenothiazines, Pregabalin, and Prednisone   Social History   Socioeconomic History   Marital status: Widowed    Spouse name: Not on file   Number of children: 2   Years of education: 40   Highest education level: 12th grade  Occupational History    Comment: Retired.   Occupation: Retired  Tobacco Use   Smoking status: Never    Passive exposure: Past   Smokeless tobacco: Never  Vaping Use   Vaping status: Never Used  Substance and Sexual Activity   Alcohol use: Not Currently    Comment: rarely   Drug use: Never   Sexual activity: Not Currently  Other Topics Concern   Not on file  Social History Narrative   Lives alone. She has two  children and 4 grandchildren. She stays active, enjoys doing crafts and painting.    Social Determinants of Health  Financial Resource Strain: Low Risk  (08/27/2023)   Received from [facility]   Overall Financial Resource Strain (CARDIA)    Difficulty of Paying Living Expenses: Not hard at all  Food Insecurity: No Food Insecurity (08/27/2023)   Received from [facility]   Hunger Vital Sign    Worried About Running Out of Food in the Last Year: Never true    Ran Out of Food in the Last Year: Never true  Transportation Needs: No Transportation Needs (08/27/2023)   Received from [facility] - Transportation    Lack of Transportation (Medical): No    Lack of Transportation (Non-Medical): No  Physical Activity: Inactive (01/09/2022)   Exercise Vital Sign    Days of Exercise per Week: 0 days    Minutes of Exercise per Session: 0 min  Stress: No Stress Concern Present (08/17/2023)   Received from [facility] of Occupational [facility] - Occupational Stress Questionnaire    Feeling of Stress : Not at all  Social Connections: Unknown (03/26/2023)   Received from [facility]   Social Network    Social Network: Not on file     Family History: The patient's family history includes Anxiety disorder in her father and mother; Cancer in her father; Depression in her father and mother; Healthy in her daughter and son; Heart attack in her mother; Heart disease in her mother; Hyperlipidemia in her father and mother; Hypertension in her father and mother; Kidney cancer in her brother; Lung cancer in her father; Obesity in her mother; Prostate cancer in her father; Sudden death in her mother.  ROS:   Please see the history of present illness.    All other systems reviewed and are negative.  EKGs/Labs/Other Studies Reviewed:    The following studies were reviewed today:  EKG Interpretation Date/Time:  Thursday October 04 2023 13:32:42  EST Ventricular Rate:  87 PR Interval:  138 QRS Duration:  86 QT Interval:  344 QTC Calculation: 413 R Axis:   -54  Text Interpretation: Normal sinus rhythm Left axis deviation Anterior infarct , age undetermined No previous ECGs available Confirmed by Belva Crome [phone]) on 10/04/2023 1:37:00 PM     Recent Labs: 02/14/2023: Hemoglobin 12.0; Platelet Count 200 03/27/2023: TSH 4.550 08/14/2023: ALT 29; BUN 17; Creatinine, Ser 0.68; Potassium 4.8; Sodium 136  Recent Lipid Panel    Component Value Date/Time   CHOL 213 (H) 02/20/2023 1128   TRIG 106 02/20/2023 1128   HDL 89 02/20/2023 1128   CHOLHDL 2.4 02/20/2023 1128   LDLCALC 104 (H) 02/20/2023 1128    Physical Exam:    VS:  BP (!) 146/80   Pulse 87   Ht 5\' 4"  (1.626 m)   Wt 181 lb 1.3 oz (82.1 kg)   SpO2 97%   BMI 31.08 kg/m     Wt Readings from Last 3 Encounters:  10/04/23 181 lb 1.3 oz (82.1 kg)  10/02/23 182 lb (82.6 kg)  10/01/23 182 lb (82.6 kg)     GEN: Patient is in no acute distress HEENT: Normal NECK: No JVD; No carotid bruits LYMPHATICS: No lymphadenopathy CARDIAC: S1 S2 regular, 2/6 systolic murmur at the apex. RESPIRATORY:  Clear to auscultation without rales, wheezing or rhonchi  ABDOMEN: Soft, non-tender, non-distended MUSCULOSKELETAL:  No edema; No deformity  SKIN: Warm and dry NEUROLOGIC:  Alert and oriented x 3 PSYCHIATRIC:  Normal affect    Signed, Garwin Brothers, MD  10/04/2023 1:57 PM  [facility]

## 2023-10-04 NOTE — Patient Instructions (Addendum)
Please keep a BP log for 1 weeks and send by MyChart or mail.  Blood Pressure Record Sheet To take your blood pressure, you will need a blood pressure machine. You can buy a blood pressure machine (blood pressure monitor) at your clinic, drug store, or online. When choosing one, consider: An automatic monitor that has an arm cuff. A cuff that wraps snugly around your upper arm. You should be able to fit only one finger between your arm and the cuff. A device that stores blood pressure reading results. Do not choose a monitor that measures your blood pressure from your wrist or finger. Follow your health care provider's instructions for how to take your blood pressure. To use this form: Get one reading in the morning (a.m.) 1-2 hours after you take any medicines. Get one reading in the evening (p.m.) before supper.   Blood pressure log Date: _______________________  a.m. _____________________(1st reading) HR___________            p.m. _____________________(2nd reading) HR__________  Date: _______________________  a.m. _____________________(1st reading) HR___________            p.m. _____________________(2nd reading) HR__________  Date: _______________________  a.m. _____________________(1st reading) HR___________            p.m. _____________________(2nd reading) HR__________  Date: _______________________  a.m. _____________________(1st reading) HR___________            p.m. _____________________(2nd reading) HR__________  Date: _______________________  a.m. _____________________(1st reading) HR___________            p.m. _____________________(2nd reading) HR__________  Date: _______________________  a.m. _____________________(1st reading) HR___________            p.m. _____________________(2nd reading) HR__________  Date: _______________________  a.m. _____________________(1st reading) HR___________            p.m. _____________________(2nd reading)  HR__________   This information is not intended to replace advice given to you by your health care provider. Make sure you discuss any questions you have with your health care provider. Document Revised: 03/03/2020 Document Reviewed: 03/03/2020 Elsevier Patient Education  2021 Elsevier Inc.   Medication Instructions:  Your physician has recommended you make the following change in your medication:   Stop Lisinopril hydrochlorothiazide  Start Lisinopril 10 mh daily  *If you need a refill on your cardiac medications before your next appointment, please call your pharmacy*   Lab Work: None ordered If you have labs (blood work) drawn today and your tests are completely normal, you will receive your results only by: MyChart Message (if you have MyChart) OR A paper copy in the mail If you have any lab test that is abnormal or we need to change your treatment, we will call you to review the results.   Testing/Procedures: Your physician has requested that you have an echocardiogram. Echocardiography is a painless test that uses sound waves to create images of your heart. It provides your doctor with information about the size and shape of your heart and how well your heart's chambers and valves are working. This procedure takes approximately one hour. There are no restrictions for this procedure. Please do NOT wear cologne, perfume, aftershave, or lotions (deodorant is allowed). Please arrive 15 minutes prior to your appointment time.  A zio monitor was ordered today. It will remain on for 14 days. Remove 10/18/23. You will then return monitor and event diary in provided box. It takes 1-2 weeks for report to be downloaded and returned to [place]. We will call you with the results. If  monitor falls off or has orange flashing light, please call Zio for further instructions.   Follow-Up: At [facility], you and your health needs are our priority.  As part of our continuing mission to provide  you with exceptional heart care, we have created designated Provider Care Teams.  These Care Teams include your primary Cardiologist (physician) and Advanced Practice Providers (APPs -  Physician Assistants and Nurse Practitioners) who all work together to provide you with the care you need, when you need it.  We recommend signing up for the patient portal called "MyChart".  Sign up information is provided on this After Visit Summary.  MyChart is used to connect with patients for Virtual Visits (Telemedicine).  Patients are able to view lab/test results, encounter notes, upcoming appointments, etc.  Non-urgent messages can be sent to your provider as well.   To learn more about what you can do with MyChart, go to [email or web address].    Your next appointment:   6 month(s)  The format for your next appointment:   In Person  Provider:   Belva Crome, MD   Other Instructions Echocardiogram An echocardiogram is a test that uses sound waves (ultrasound) to produce images of the heart. Images from an echocardiogram can provide important information about: Heart size and shape. The size and thickness and movement of your heart's walls. Heart muscle function and strength. Heart valve function or if you have stenosis. Stenosis is when the heart valves are too narrow. If blood is flowing backward through the heart valves (regurgitation). A tumor or infectious growth around the heart valves. Areas of heart muscle that are not working well because of poor blood flow or injury from a heart attack. Aneurysm detection. An aneurysm is a weak or damaged part of an artery wall. The wall bulges out from the normal force of blood pumping through the body. Tell a health care provider about: Any allergies you have. All medicines you are taking, including vitamins, herbs, eye drops, creams, and over-the-counter medicines. Any blood disorders you have. Any surgeries you have had. Any medical conditions  you have. Whether you are pregnant or may be pregnant. What are the risks? Generally, this is a safe test. However, problems may occur, including an allergic reaction to dye (contrast) that may be used during the test. What happens before the test? No specific preparation is needed. You may eat and drink normally. What happens during the test? You will take off your clothes from the waist up and put on a hospital gown. Electrodes or electrocardiogram (ECG)patches may be placed on your chest. The electrodes or patches are then connected to a device that monitors your heart rate and rhythm. You will lie down on a table for an ultrasound exam. A gel will be applied to your chest to help sound waves pass through your skin. A handheld device, called a transducer, will be pressed against your chest and moved over your heart. The transducer produces sound waves that travel to your heart and bounce back (or "echo" back) to the transducer. These sound waves will be captured in real-time and changed into images of your heart that can be viewed on a video monitor. The images will be recorded on a computer and reviewed by your health care provider. You may be asked to change positions or hold your breath for a short time. This makes it easier to get different views or better views of your heart. In some cases, you may receive contrast through an  IV in one of your veins. This can improve the quality of the pictures from your heart. The procedure may vary among health care providers and hospitals.   What can I expect after the test? You may return to your normal, everyday life, including diet, activities, and medicines, unless your health care provider tells you not to do that. Follow these instructions at home: It is up to you to get the results of your test. Ask your health care provider, or the department that is doing the test, when your results will be ready. Keep all follow-up visits. This is  important. Summary An echocardiogram is a test that uses sound waves (ultrasound) to produce images of the heart. Images from an echocardiogram can provide important information about the size and shape of your heart, heart muscle function, heart valve function, and other possible heart problems. You do not need to do anything to prepare before this test. You may eat and drink normally. After the echocardiogram is completed, you may return to your normal, everyday life, unless your health care provider tells you not to do that. This information is not intended to replace advice given to you by your health care provider. Make sure you discuss any questions you have with your health care provider. Document Revised: 07/06/2020 Document Reviewed: 07/06/2020 Elsevier Patient Education  2021 Elsevier Inc.   Important Information About Sugar

## 2023-10-05 ENCOUNTER — Encounter: Payer: Self-pay | Admitting: Rheumatology

## 2023-10-05 ENCOUNTER — Ambulatory Visit: Payer: Medicare Other | Attending: Rheumatology | Admitting: Rheumatology

## 2023-10-05 VITALS — BP 121/80 | HR 77 | Resp 14 | Ht 64.0 in | Wt 180.8 lb

## 2023-10-05 DIAGNOSIS — M797 Fibromyalgia: Secondary | ICD-10-CM

## 2023-10-05 DIAGNOSIS — Z95828 Presence of other vascular implants and grafts: Secondary | ICD-10-CM | POA: Diagnosis present

## 2023-10-05 DIAGNOSIS — E538 Deficiency of other specified B group vitamins: Secondary | ICD-10-CM

## 2023-10-05 DIAGNOSIS — M19041 Primary osteoarthritis, right hand: Secondary | ICD-10-CM | POA: Diagnosis not present

## 2023-10-05 DIAGNOSIS — M5030 Other cervical disc degeneration, unspecified cervical region: Secondary | ICD-10-CM

## 2023-10-05 DIAGNOSIS — Z8669 Personal history of other diseases of the nervous system and sense organs: Secondary | ICD-10-CM | POA: Diagnosis present

## 2023-10-05 DIAGNOSIS — Z8639 Personal history of other endocrine, nutritional and metabolic disease: Secondary | ICD-10-CM | POA: Diagnosis present

## 2023-10-05 DIAGNOSIS — M4125 Other idiopathic scoliosis, thoracolumbar region: Secondary | ICD-10-CM

## 2023-10-05 DIAGNOSIS — M19042 Primary osteoarthritis, left hand: Secondary | ICD-10-CM | POA: Diagnosis present

## 2023-10-05 DIAGNOSIS — I10 Essential (primary) hypertension: Secondary | ICD-10-CM | POA: Diagnosis present

## 2023-10-05 DIAGNOSIS — L821 Other seborrheic keratosis: Secondary | ICD-10-CM | POA: Diagnosis present

## 2023-10-05 DIAGNOSIS — M810 Age-related osteoporosis without current pathological fracture: Secondary | ICD-10-CM

## 2023-10-05 DIAGNOSIS — M47816 Spondylosis without myelopathy or radiculopathy, lumbar region: Secondary | ICD-10-CM | POA: Diagnosis present

## 2023-10-05 DIAGNOSIS — M170 Bilateral primary osteoarthritis of knee: Secondary | ICD-10-CM | POA: Diagnosis present

## 2023-10-05 DIAGNOSIS — K219 Gastro-esophageal reflux disease without esophagitis: Secondary | ICD-10-CM

## 2023-10-05 DIAGNOSIS — Z780 Asymptomatic menopausal state: Secondary | ICD-10-CM | POA: Diagnosis present

## 2023-10-05 DIAGNOSIS — Z96643 Presence of artificial hip joint, bilateral: Secondary | ICD-10-CM

## 2023-10-05 DIAGNOSIS — E559 Vitamin D deficiency, unspecified: Secondary | ICD-10-CM | POA: Diagnosis present

## 2023-10-05 DIAGNOSIS — Z8719 Personal history of other diseases of the digestive system: Secondary | ICD-10-CM | POA: Diagnosis present

## 2023-10-05 DIAGNOSIS — R5382 Chronic fatigue, unspecified: Secondary | ICD-10-CM

## 2023-10-05 DIAGNOSIS — I7300 Raynaud's syndrome without gangrene: Secondary | ICD-10-CM | POA: Diagnosis not present

## 2023-10-05 DIAGNOSIS — M2550 Pain in unspecified joint: Secondary | ICD-10-CM

## 2023-10-05 DIAGNOSIS — F418 Other specified anxiety disorders: Secondary | ICD-10-CM

## 2023-10-05 DIAGNOSIS — Z9884 Bariatric surgery status: Secondary | ICD-10-CM

## 2023-10-05 DIAGNOSIS — L409 Psoriasis, unspecified: Secondary | ICD-10-CM | POA: Diagnosis present

## 2023-10-05 DIAGNOSIS — E039 Hypothyroidism, unspecified: Secondary | ICD-10-CM | POA: Diagnosis present

## 2023-10-05 DIAGNOSIS — E792 Myoadenylate deaminase deficiency: Secondary | ICD-10-CM

## 2023-10-05 NOTE — Patient Instructions (Addendum)
Osteoarthritis  Osteoarthritis is a type of arthritis. It refers to joint pain or joint disease. Osteoarthritis affects tissue that covers the ends of bones in joints (cartilage). Cartilage acts as a cushion between the bones and helps them move smoothly. Osteoarthritis occurs when cartilage in the joints gets worn down. Osteoarthritis is sometimes called "wear and tear" arthritis. Osteoarthritis is the most common form of arthritis. It often occurs in older people. It is a condition that gets worse over time. The joints most often affected by this condition are in the fingers, toes, hips, knees, and spine, including the neck and lower back. What are the causes? This condition is caused by the wearing down of cartilage that covers the ends of bones. What increases the risk? The following factors may make you more likely to develop this condition: Being age 34 or older. Obesity. Overuse of joints. Past injury of a joint. Past surgery on a joint. Family history of osteoarthritis. What are the signs or symptoms? The main symptoms of this condition are pain, swelling, and stiffness in the joint. Other symptoms may include: An enlarged joint. More pain and further damage caused by small pieces of bone or cartilage that break off and float inside of the joint. Small deposits of bone (osteophytes) that grow on the edges of the joint. A grating or scraping feeling inside the joint when you move it. Popping or creaking sounds when you move. Difficulty walking or exercising. An inability to grip items, twist your hand, or control the movements of your hands and fingers. How is this diagnosed? This condition may be diagnosed based on: Your medical history. A physical exam. Your symptoms. X-rays of the affected joints. Blood tests to rule out other types of arthritis. How is this treated? There is no cure for this condition, but treatment can help control pain and improve joint function. Treatment  may include a combination of therapies, such as: Pain relief techniques, such as: Applying heat and cold to the joint. Massage. A form of talk therapy called cognitive behavioral therapy (CBT). This therapy helps you set goals and follow up on the changes that you make. Medicines for pain and inflammation. The medicines can be taken by mouth or applied to the skin. They include: NSAIDs, such as ibuprofen. Prescription medicines. Strong anti-inflammatory medicines (corticosteroids). Certain nutritional supplements. A prescribed exercise program. You may work with a physical therapist. Assistive devices, such as a brace, wrap, splint, specialized glove, or cane. A weight control plan. Surgery, such as: An osteotomy. This is done to reposition the bones and relieve pain or to remove loose pieces of bone and cartilage. Joint replacement surgery. You may need this surgery if you have advanced osteoarthritis. Follow these instructions at home: Activity Rest your affected joints as told by your health care provider. Exercise as told by your provider. The provider may recommend specific types of exercise, such as: Strengthening exercises. These are done to strengthen the muscles that support joints affected by arthritis. Aerobic activities. These are exercises, such as brisk walking or water aerobics, that increase your heart rate. Range-of-motion activities. These help your joints move more easily. Balance and agility exercises. Managing pain, stiffness, and swelling     If told, apply heat to the affected area as often as told by your provider. Use the heat source that your provider recommends, such as a moist heat pack or a heating pad. If you have a removable assistive device, remove it as told by your provider. Place a  towel between your skin and the heat source. If your provider tells you to keep the assistive device on while you apply heat, place a towel between the assistive device and  the heat source. Leave the heat on for 20-30 minutes. If told, put ice on the affected area. If you have a removable assistive device, remove it as told by your provider. Put ice in a plastic bag. Place a towel between your skin and the bag. If your provider tells you to keep the assistive device on during icing, place a towel between the assistive device and the bag. Leave the ice on for 20 minutes, 2-3 times a day. If your skin turns bright red, remove the ice or heat right away to prevent skin damage. The risk of damage is higher if you cannot feel pain, heat, or cold. Move your fingers or toes often to reduce stiffness and swelling. Raise (elevate) the affected area above the level of your heart while you are sitting or lying down. General instructions Take over-the-counter and prescription medicines only as told by your provider. Maintain a healthy weight. Follow instructions from your provider for weight control. Do not use any products that contain nicotine or tobacco. These products include cigarettes, chewing tobacco, and vaping devices, such as e-cigarettes. If you need help quitting, ask your provider. Use assistive devices as told by your provider. Where to find more information [employer] of Arthritis and Musculoskeletal and Skin Diseases: niams.http://www.myers.net/ [employer] on Aging: BaseRingTones.pl American College of Rheumatology: rheumatology.org Contact a health care provider if: You have redness, swelling, or a feeling of warmth in a joint that gets worse. You have a fever along with joint or muscle aches. You develop a rash. You have trouble doing your normal activities. You have pain that gets worse and is not relieved by pain medicine. This information is not intended to replace advice given to you by your health care provider. Make sure you discuss any questions you have with your health care provider. Document Revised: 07/13/2022 Document Reviewed:  07/13/2022 Elsevier Patient Education  2024 [employer]  Hand Exercises Hand exercises can be helpful for almost anyone. They can strengthen your hands and improve flexibility and movement. The exercises can also increase blood flow to the hands. These results can make your work and daily tasks easier for you. Hand exercises can be especially helpful for people who have joint pain from arthritis or nerve damage from using their hands over and over. These exercises can also help people who injure a hand. Exercises Most of these hand exercises are gentle stretching and motion exercises. It is usually safe to do them often throughout the day. Warming up your hands before exercise may help reduce stiffness. You can do this with gentle massage or by placing your hands in warm water for 10-15 minutes. It is normal to feel some stretching, pulling, tightness, or mild discomfort when you begin new exercises. In time, this will improve. Remember to always be careful and stop right away if you feel sudden, very bad pain or your pain gets worse. You want to get better and be safe. Ask your health care provider which exercises are safe for you. Do exercises exactly as told by your provider and adjust them as told. Do not begin these exercises until told by your provider. Knuckle bend or "claw" fist  Stand or sit with your arm, hand, and all five fingers pointed straight up. Make sure to keep your wrist straight. Gently  bend your fingers down toward your palm until the tips of your fingers are touching your palm. Keep your big knuckle straight and only bend the small knuckles in your fingers. Hold this position for 10 seconds. Straighten your fingers back to your starting position. Repeat this exercise 5-10 times with each hand. Full finger fist  Stand or sit with your arm, hand, and all five fingers pointed straight up. Make sure to keep your wrist straight. Gently bend your fingers into your palm until  the tips of your fingers are touching the middle of your palm. Hold this position for 10 seconds. Extend your fingers back to your starting position, stretching every joint fully. Repeat this exercise 5-10 times with each hand. Straight fist  Stand or sit with your arm, hand, and all five fingers pointed straight up. Make sure to keep your wrist straight. Gently bend your fingers at the big knuckle, where your fingers meet your hand, and at the middle knuckle. Keep the knuckle at the tips of your fingers straight and try to touch the bottom of your palm. Hold this position for 10 seconds. Extend your fingers back to your starting position, stretching every joint fully. Repeat this exercise 5-10 times with each hand. Tabletop  Stand or sit with your arm, hand, and all five fingers pointed straight up. Make sure to keep your wrist straight. Gently bend your fingers at the big knuckle, where your fingers meet your hand, as far down as you can. Keep the small knuckles in your fingers straight. Think of forming a tabletop with your fingers. Hold this position for 10 seconds. Extend your fingers back to your starting position, stretching every joint fully. Repeat this exercise 5-10 times with each hand. Finger spread  Place your hand flat on a table with your palm facing down. Make sure your wrist stays straight. Spread your fingers and thumb apart from each other as far as you can until you feel a gentle stretch. Hold this position for 10 seconds. Bring your fingers and thumb tight together again. Hold this position for 10 seconds. Repeat this exercise 5-10 times with each hand. Making circles  Stand or sit with your arm, hand, and all five fingers pointed straight up. Make sure to keep your wrist straight. Make a circle by touching the tip of your thumb to the tip of your index finger. Hold for 10 seconds. Then open your hand wide. Repeat this motion with your thumb and each of your  fingers. Repeat this exercise 5-10 times with each hand. Thumb motion  Sit with your forearm resting on a table and your wrist straight. Your thumb should be facing up toward the ceiling. Keep your fingers relaxed as you move your thumb. Lift your thumb up as high as you can toward the ceiling. Hold for 10 seconds. Bend your thumb across your palm as far as you can, reaching the tip of your thumb for the small finger (pinkie) side of your palm. Hold for 10 seconds. Repeat this exercise 5-10 times with each hand. Grip strengthening  Hold a stress ball or other soft ball in the middle of your hand. Slowly increase the pressure, squeezing the ball as much as you can without causing pain. Think of bringing the tips of your fingers into the middle of your palm. All of your finger joints should bend when doing this exercise. Hold your squeeze for 10 seconds, then relax. Repeat this exercise 5-10 times with each hand. Contact a health  care provider if: Your hand pain or discomfort gets much worse when you do an exercise. Your hand pain or discomfort does not improve within 2 hours after you exercise. If you have either of these problems, stop doing these exercises right away. Do not do them again unless your provider says that you can. Get help right away if: You develop sudden, severe hand pain or swelling. If this happens, stop doing these exercises right away. Do not do them again unless your provider says that you can. This information is not intended to replace advice given to you by your health care provider. Make sure you discuss any questions you have with your health care provider. Document Revised: 11/28/2022 Document Reviewed: 11/28/2022 Elsevier Patient Education  2024 [employer] Exercises for Chronic Knee Pain Chronic knee pain is pain that lasts longer than 3 months. For most people with chronic knee pain, exercise and weight loss is an important part of treatment. Your health care  provider may want you to focus on: Making the muscles that support your knee stronger. This can take pressure off your knee and reduce pain. Preventing knee stiffness. How far you can move your knee, keeping it there or making it farther. Losing weight (if this applies) to take pressure off your knee, lower your risk for injury, and make it easier for you to exercise. Your provider will help you make an exercise program that fits your needs and physical abilities. Below are simple, low-impact exercises you can do at home. Ask your provider or physical therapist how often you should do your exercise program and how many times to repeat each exercise. General safety tips  Get your provider's approval before doing any exercises. Start slowly and stop any time you feel pain. Do not exercise if your knee pain is flaring up. Warm up first. Stretching a cold muscle can cause an injury. Do 5-10 minutes of easy movement or light stretching before beginning your exercises. Do 5-10 minutes of low-impact activity (like walking or cycling) before starting strengthening exercises. Contact your provider any time you have pain during or after exercising. Exercise can cause discomfort but should not be painful. It is normal to be a little stiff or sore after exercising. Stretching and range-of-motion exercises Front thigh stretch  Stand up straight and support your body by holding on to a chair or resting one hand on a wall. With your legs straight and close together, bend one knee to lift your heel up toward your butt. Using one hand for support, grab your ankle with your free hand. Pull your foot up closer toward your butt to feel the stretch in front of your thigh. Hold the stretch for 30 seconds. Repeat __________ times. Complete this exercise __________ times a day. Back thigh stretch  Sit on the floor with your back straight and your legs out straight in front of you. Place the palms of your hands on  the floor and slide them toward your feet as you bend at the hip. Try to touch your nose to your knees and feel the stretch in the back of your thighs. Hold for 30 seconds. Repeat __________ times. Complete this exercise __________ times a day. Calf stretch  Stand facing a wall. Place the palms of your hands flat against the wall, arms extended, and lean slightly against the wall. Get into a lunge position with one leg bent at the knee and the other leg stretched out straight behind you. Keep both feet facing  the wall and increase the bend in your knee while keeping the heel of the other leg flat on the ground. You should feel the stretch in your calf. Hold for 30 seconds. Repeat __________ times. Complete this exercise __________ times a day. Strengthening exercises Straight leg lift  Lie on your back with one knee bent and the other leg out straight. Slowly lift the straight leg without bending the knee. Lift until your foot is about 12 inches (30 cm) off the floor. Hold for 3-5 seconds and slowly lower your leg. Repeat __________ times. Complete this exercise __________ times a day. Single leg dip  Stand between two chairs and put both hands on the backs of the chairs for support. Extend one leg out straight with your body weight resting on the heel of the standing leg. Slowly bend your standing knee to dip your body to the level that is comfortable for you. Hold for 3-5 seconds. Repeat __________ times. Complete this exercise __________ times a day. Hamstring curls  Stand straight, knees close together, facing the back of a chair. Hold on to the back of a chair with both hands. Keep one leg straight. Bend the other knee while bringing the heel up toward the butt until the knee is bent at a 90-degree angle (right angle). Hold for 3-5 seconds. Repeat __________ times. Complete this exercise __________ times a day. Wall squat  Stand straight with your back, hips, and head against  a wall. Step forward one foot at a time with your back still against the wall. Your feet should be 2 feet (61 cm) from the wall at shoulder width. Keeping your back, hips, and head against the wall, slide down the wall to as close to a sitting position as you can get. Hold for 5-10 seconds, then slowly slide back up. Repeat __________ times. Complete this exercise __________ times a day. Step-ups  Stand in front of a sturdy platform or stool that is about 6 inches (15 cm) high. Slowly step up with your left / right foot, keeping your knee in line with your hip and foot. Do not let your knee bend so far that you cannot see your toes. Hold on to a chair for balance, but do not use it for support. Slowly unlock your knee and lower yourself to the starting position. Repeat __________ times. Complete this exercise __________ times a day. Contact a health care provider if: Your exercises cause pain. Your pain is worse after you exercise. Your pain prevents you from doing your exercises. This information is not intended to replace advice given to you by your health care provider. Make sure you discuss any questions you have with your health care provider. Document Revised: 11/28/2022 Document Reviewed: 11/28/2022 Elsevier Patient Education  2024 [employer].

## 2023-10-09 ENCOUNTER — Other Ambulatory Visit: Payer: Self-pay | Admitting: Medical-Surgical

## 2023-10-09 DIAGNOSIS — G4700 Insomnia, unspecified: Secondary | ICD-10-CM

## 2023-10-10 ENCOUNTER — Ambulatory Visit: Payer: Medicare Other | Admitting: Nurse Practitioner

## 2023-10-10 NOTE — Telephone Encounter (Signed)
Patient was approved and was aware of the copay for the shots unable to afford it right k=now but will let us know when she does.

## 2023-10-15 ENCOUNTER — Ambulatory Visit: Payer: Medicare Other

## 2023-10-22 ENCOUNTER — Ambulatory Visit (INDEPENDENT_AMBULATORY_CARE_PROVIDER_SITE_OTHER): Payer: Medicare Other | Admitting: Nurse Practitioner

## 2023-10-22 ENCOUNTER — Encounter: Payer: Self-pay | Admitting: Nurse Practitioner

## 2023-10-22 VITALS — BP 147/83 | HR 68 | Temp 97.9°F | Ht 64.0 in | Wt 176.0 lb

## 2023-10-22 DIAGNOSIS — R638 Other symptoms and signs concerning food and fluid intake: Secondary | ICD-10-CM | POA: Diagnosis not present

## 2023-10-22 DIAGNOSIS — Z6830 Body mass index (BMI) 30.0-30.9, adult: Secondary | ICD-10-CM | POA: Diagnosis not present

## 2023-10-22 DIAGNOSIS — E669 Obesity, unspecified: Secondary | ICD-10-CM | POA: Diagnosis not present

## 2023-10-22 NOTE — Progress Notes (Signed)
Office: [phone]  /  Fax: [phone]  WEIGHT SUMMARY AND BIOMETRICS  Weight Lost Since Last Visit: 6 lb  Weight Gained Since Last Visit: 0   Vitals Temp: 97.9 F (36.6 C) BP: (!) 147/83 Pulse Rate: 68 SpO2: 99 %   Anthropometric Measurements Height: 5\' 4"  (1.626 m) Weight: 176 lb (79.8 kg) BMI (Calculated): 30.2 Weight at Last Visit: 182 lb Weight Lost Since Last Visit: 6 lb Weight Gained Since Last Visit: 0 Starting Weight: 178 lb Total Weight Loss (lbs): 6 lb (2.722 kg)   Body Composition  Body Fat %: 45.4 % Fat Mass (lbs): 80.2 lbs Muscle Mass (lbs): 91.6 lbs Total Body Water (lbs): 69.6 lbs Visceral Fat Rating : 13   Other Clinical Data Fasting: yes Labs: no Today's Visit #: 8 Starting Date: 03/27/23     HPI  Chief Complaint: OBESITY  Heather Thompson is here to discuss her progress with her obesity treatment plan. She is on the the Category 2 Plan and states she is following her eating plan approximately 50-60% of the time. She states she is exercising 25 minutes 3 days per week.   Interval History:  Since last office visit she has lost 6 pounds. She is aiming to eat more protein.  She snacks on cottage cheese, yogurt, cheese, etc.  She feels that she is in a better mindset at the time for weight loss.   She has an appt with Dr. Dewaine Conger on 11/05/23 for "Has times of feeling like an out of body experience with night time eating.  Like she is watching herself eating"   Pharmacotherapy for weight loss: She stopped taking Victoza last week and doesn't want to restart taking it at this time.  She feels that she is overall doing better and doesn't feel she needs it at this time. Since stopping Victoza she denies increased polyphagia or cravings.  Still struggles with nighttime eating.  Did not change while taking Victoza.    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 Her highest  weight was 267 lbs and nadir weight was 154 lbs. Her weight has fluctuated over the years. Highest weight since having surgery was 216 lbs.   High Vit B12 Last B12 injection was in Oct.  She is taking Vit B12 OTC.    PHYSICAL EXAM:  Blood pressure (!) 147/83, pulse 68, temperature 97.9 F (36.6 C), height 5\' 4"  (1.626 m), weight 176 lb (79.8 kg), SpO2 99%. Body mass index is 30.21 kg/m.  General: She is overweight, cooperative, alert, well developed, and in no acute distress. PSYCH: Has normal mood, affect and thought process.   Extremities: No edema.  Neurologic: No gross sensory or motor deficits. No tremors or fasciculations noted.    DIAGNOSTIC DATA REVIEWED:  BMET    Component Value Date/Time   NA 136 08/14/2023 1357   K 4.8 08/14/2023 1357   CL 95 (L) 08/14/2023 1357   CO2 26 08/14/2023 1357   GLUCOSE 101 (H) 08/14/2023 1357   GLUCOSE 92 02/20/2023 1128   BUN 17 08/14/2023 1357   CREATININE 0.68 08/14/2023 1357   CREATININE 0.80 02/20/2023 1128   CALCIUM 9.6 08/14/2023 1357   GFRNONAA >60 09/07/2021 1433   Lab Results  Component Value Date   HGBA1C 6.1 (A) 08/14/2023   HGBA1C 6.1 08/14/2023   HGBA1C 6.0 (H) 04/06/2022   Lab Results  Component Value Date   INSULIN 8.7 03/27/2023  Lab Results  Component Value Date   TSH 4.550 (H) 03/27/2023   CBC    Component Value Date/Time   WBC 5.2 02/14/2023 1352   WBC 4.7 12/19/2022 1157   RBC 3.99 02/14/2023 1352   RBC 3.97 02/14/2023 1351   HGB 12.0 02/14/2023 1352   HCT 36.7 02/14/2023 1352   PLT 200 02/14/2023 1352   MCV 92.0 02/14/2023 1352   MCH 30.1 02/14/2023 1352   MCHC 32.7 02/14/2023 1352   RDW 13.0 02/14/2023 1352   Iron Studies    Component Value Date/Time   IRON 93 02/14/2023 1352   TIBC 392 02/14/2023 1352   FERRITIN 123 02/14/2023 1352   IRONPCTSAT 24 02/14/2023 1352   IRONPCTSAT 25 12/19/2022 1157   Lipid Panel     Component Value Date/Time   CHOL 213 (H) 02/20/2023 1128   TRIG 106  02/20/2023 1128   HDL 89 02/20/2023 1128   CHOLHDL 2.4 02/20/2023 1128   LDLCALC 104 (H) 02/20/2023 1128   Hepatic Function Panel     Component Value Date/Time   PROT 6.9 08/14/2023 1357   ALBUMIN 4.2 08/14/2023 1357   AST 30 08/14/2023 1357   AST 30 09/07/2021 1433   ALT 29 08/14/2023 1357   ALT 31 09/07/2021 1433   ALKPHOS 107 08/14/2023 1357   BILITOT 0.4 08/14/2023 1357   BILITOT 0.3 09/07/2021 1433   BILIDIR 0.12 04/10/2023 1624      Component Value Date/Time   TSH 4.550 (H) 03/27/2023 0957   Nutritional Lab Results  Component Value Date   VD25OH 46.8 03/27/2023   VD25OH 78 07/10/2022     ASSESSMENT AND PLAN  TREATMENT PLAN FOR OBESITY:  Recommended Dietary Goals  Heather Thompson is currently in the action stage of change. As such, her goal is to continue weight management plan. She has agreed to the Category 2 Plan.  Behavioral Intervention  We discussed the following Behavioral Modification Strategies today: continue to work on maintaining a reduced calorie state, getting the recommended amount of protein, incorporating whole foods, making healthy choices, staying well hydrated and practicing mindfulness when eating..  Additional resources provided today: NA  Recommended Physical Activity Goals  Heather Thompson has been advised to work up to 150 minutes of moderate intensity aerobic activity a week and strengthening exercises 2-3 times per week for cardiovascular health, weight loss maintenance and preservation of muscle mass.   She has agreed to Think about enjoyable ways to increase daily physical activity and overcoming barriers to exercise, Increase physical activity in their day and reduce sedentary time (increase NEAT)., Increase the intensity, frequency or duration of strengthening exercises , and Increase the intensity, frequency or duration of aerobic exercises    ASSOCIATED CONDITIONS ADDRESSED TODAY  Action/Plan  Abnormal craving Keep appt with Dr.  Dewaine Conger  Generalized obesity  BMI 30.0-30.9,adult     Keep appt with cardiology on 10/30/23 for echo    Return in about 4 weeks (around 11/19/2023).Marland Kitchen She was informed of the importance of frequent follow up visits to maximize her success with intensive lifestyle modifications for her multiple health conditions.   ATTESTASTION STATEMENTS:  Reviewed by clinician on day of visit: allergies, medications, problem list, medical history, surgical history, family history, social history, and previous encounter notes.   Time spent on visit including pre-visit chart review and post-visit care and charting was 30 minutes.    Theodis Sato. Heather Adolph FNP-C

## 2023-10-30 ENCOUNTER — Ambulatory Visit (HOSPITAL_BASED_OUTPATIENT_CLINIC_OR_DEPARTMENT_OTHER)
Admission: RE | Admit: 2023-10-30 | Discharge: 2023-10-30 | Disposition: A | Discharge status: Home / Self Care | Payer: Medicare Other | Source: Ambulatory Visit | Attending: Cardiology | Admitting: Cardiology

## 2023-10-30 DIAGNOSIS — R011 Cardiac murmur, unspecified: Secondary | ICD-10-CM | POA: Diagnosis not present

## 2023-10-30 LAB — ECHOCARDIOGRAM COMPLETE
AR max vel: 2.08 cm2
AV Area VTI: 1.94 cm2
AV Area mean vel: 1.9 cm2
AV Mean grad: 4 mm[Hg]
AV Peak grad: 6.3 mm[Hg]
Ao pk vel: 1.25 m/s
Area-P 1/2: 2.79 cm2
Calc EF: 62.7 %
S' Lateral: 2.8 cm
Single Plane A2C EF: 64.6 %
Single Plane A4C EF: 60.9 %

## 2023-10-31 ENCOUNTER — Encounter: Payer: Self-pay | Admitting: Nurse Practitioner

## 2023-11-05 ENCOUNTER — Telehealth (INDEPENDENT_AMBULATORY_CARE_PROVIDER_SITE_OTHER): Payer: Medicare Other | Admitting: Psychology

## 2023-11-05 DIAGNOSIS — F4323 Adjustment disorder with mixed anxiety and depressed mood: Secondary | ICD-10-CM

## 2023-11-05 DIAGNOSIS — F5089 Other specified eating disorder: Secondary | ICD-10-CM
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
## 2023-11-08 ENCOUNTER — Other Ambulatory Visit: Payer: Self-pay | Admitting: Medical-Surgical

## 2023-11-09 ENCOUNTER — Telehealth: Payer: Self-pay

## 2023-11-09 DIAGNOSIS — G4700 Insomnia, unspecified: Secondary | ICD-10-CM

## 2023-11-09 MED ORDER — TEMAZEPAM 22.5 MG PO CAPS: ORAL_CAPSULE | ORAL | 3 refills | Status: DC

## 2023-11-09 NOTE — Telephone Encounter (Signed)
Refill sent early with note to pharmacy to fill early.

## 2023-11-09 NOTE — Telephone Encounter (Signed)
Copied from CRM [id number]. Topic: Clinical - Prescription Issue >> Nov 09, 2023 10:36 AM Orinda Kenner C wrote: Reason for CRM: Weston Brass from [facility] [phone] calling about temazepam (RESTORIL) 22.5 MG capsule is leaving of town on Monday and will run of meds, however the rx right is too early to fill, rx is scheduled for 11/21/23, but the pharmacy is closed and the pt will be out of town. The pharmacy can fill the rx, but needs provider to ok it. Pls c/b today.

## 2023-11-09 NOTE — Telephone Encounter (Signed)
Pt is needing to see if Dr. Larinda Buttery can send an early refill request to pharmacy & ok it. Pt states she will be leaving Monday to go spend the holidays with family & will run out of medication while out of town. Pt's CB #: [phone].

## 2023-11-09 NOTE — Telephone Encounter (Signed)
Copied from CRM [id number]. Topic: Clinical - Medication Refill >> Nov 08, 2023 11:29 AM Prudencio Pair wrote: Most Recent Primary Care Visit:  Provider: Cristy Folks  Department: [facility]  Visit Type: MYCHART VIDEO VISIT  Date: 11/05/2023  Medication: Temazepam 22.5 MG capsule  Has the patient contacted their pharmacy? Yes, pt needs to have pcp to "ok" for early refill. (Agent: If no, request that the patient contact the pharmacy for the refill. If patient does not wish to contact the pharmacy document the reason why and proceed with request.) (Agent: If yes, when and what did the pharmacy advise?)  Is this the correct pharmacy for this prescription? Yes If no, delete pharmacy and type the correct one.  This is the patient's preferred pharmacy:  [facility] [place], [place] - [address] [place] [address] Phone: [phone] Fax: [phone]   Has the prescription been filled recently? Yes  Is the patient out of the medication? No  Has the patient been seen for an appointment in the last year OR does the patient have an upcoming appointment? Yes  Can we respond through MyChart? No  Agent: Please be advised that Rx refills may take up to 3 business days. We ask that you follow-up with your pharmacy.

## 2023-11-21 ENCOUNTER — Other Ambulatory Visit (HOSPITAL_COMMUNITY): Payer: Self-pay | Admitting: Psychiatry

## 2023-11-21 DIAGNOSIS — F322 Major depressive disorder, single episode, severe without psychotic features: Secondary | ICD-10-CM

## 2023-11-27 ENCOUNTER — Telehealth (INDEPENDENT_AMBULATORY_CARE_PROVIDER_SITE_OTHER): Payer: Medicare Other | Admitting: Psychology

## 2023-11-27 ENCOUNTER — Telehealth (INDEPENDENT_AMBULATORY_CARE_PROVIDER_SITE_OTHER): Payer: Self-pay | Admitting: Psychology

## 2023-11-27 NOTE — Telephone Encounter (Signed)
  Office: [phone]  /  Fax: [phone]  Date of Call: November 27, 2023  Time of Call: 12:02pm Duration of Call: 3 minute(s) Provider: Wyatt Fire, PsyD  CONTENT: This provider called Donny to check-in as she did not present for today's MyChart Video Visit appointment at 12pm. She acknowledged she forgot about today's appointment and was visiting family in [place] . Mikaila requested to reschedule. No evidence or endorsement of any safety concerns. All questions/concerns addressed.   PLAN:  Victora is scheduled for an appointment on 12/18/2023 at 9:30am via MyChart Video Visit.

## 2023-11-27 NOTE — Progress Notes (Unsigned)
  Office: [phone]  /  Fax: [phone]    Date: November 27, 2023  Appointment Start Time: *** Duration: *** minutes Provider: Wyatt Fire, Psy.D. Type of Session: Individual Therapy  Location of Patient: {gbptloc:23249} (private location) Location of Provider: Provider's Home (private office) Type of Contact: Telepsychological Visit via MyChart Video Visit  Session Content:This provider called Heather Thompson at 12:02pm as she did not present for today's appointment. *** As such, today's appointment was initiated *** minutes late. Heather Thompson is a 72 y.o. female presenting for a follow-up appointment to address the previously established treatment goal of increasing coping skills.Today's appointment was a telepsychological visit. Heather Thompson provided verbal consent for today's telepsychological appointment and she is aware she is responsible for securing confidentiality on her end of the session. Prior to proceeding with today's appointment, Heather Thompson's physical location at the time of this appointment was obtained as well a phone number she could be reached at in the event of technical difficulties. Heather Thompson and this provider participated in today's telepsychological service.   This provider conducted a brief check-in. *** Heather Thompson was receptive to today's appointment as evidenced by openness to sharing, responsiveness to feedback, and {gbreceptiveness:23401}.  Mental Status Examination:  Appearance: {Appearance:22431} Behavior: {Behavior:22445} Mood: {gbmood:21757} Affect: {Affect:22436} Speech: {Speech:22432} Eye Contact: {Eye Contact:22433} Psychomotor Activity: {Motor Activity:22434} Gait: {gbgait:23404} Thought Process: {thought process:22448}  Thought Content/Perception: {disturbances:22451} Orientation: {Orientation:22437} Memory/Concentration: {gbcognition:22449} Insight: {Insight:22446} Judgment: {Insight:22446}  Interventions:  {Interventions for Progress Notes:23405}  DSM-5 Diagnosis(es):   F50.89 Other Specified Feeding or Eating Disorder, Emotional Eating Behaviors and  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 Heather Thompson has demonstrated progress in her goal as evidenced by {gbtxprogress:22839}. Heather Thompson also {gbtxprogress2:22951}.  Plan: The next appointment is scheduled for *** at ***, which will be via MyChart Video Visit. The next session will focus on {Plan for Next Appointment:23400}.   Wyatt Fire, PsyD

## 2023-12-03 ENCOUNTER — Other Ambulatory Visit: Payer: Self-pay | Admitting: Medical-Surgical

## 2023-12-04 ENCOUNTER — Ambulatory Visit: Payer: Medicare Other | Admitting: Nurse Practitioner

## 2023-12-06 ENCOUNTER — Ambulatory Visit: Payer: Medicare Other | Admitting: Cardiology

## 2023-12-11 ENCOUNTER — Ambulatory Visit (INDEPENDENT_AMBULATORY_CARE_PROVIDER_SITE_OTHER): Payer: Medicare Other | Admitting: Sports Medicine

## 2023-12-11 ENCOUNTER — Ambulatory Visit: Payer: Medicare Other

## 2023-12-11 DIAGNOSIS — M7062 Trochanteric bursitis, left hip: Secondary | ICD-10-CM

## 2023-12-11 DIAGNOSIS — Z96643 Presence of artificial hip joint, bilateral: Secondary | ICD-10-CM | POA: Diagnosis not present

## 2023-12-11 DIAGNOSIS — M7061 Trochanteric bursitis, right hip: Secondary | ICD-10-CM

## 2023-12-11 DIAGNOSIS — M961 Postlaminectomy syndrome, not elsewhere classified: Secondary | ICD-10-CM

## 2023-12-11 DIAGNOSIS — M8588 Other specified disorders of bone density and structure, other site: Secondary | ICD-10-CM

## 2023-12-11 MED ORDER — GABAPENTIN 300 MG PO CAPS: ORAL_CAPSULE | ORAL | 3 refills | Status: DC

## 2023-12-11 MED ORDER — CELECOXIB 200 MG PO CAPS: ORAL_CAPSULE | ORAL | 2 refills | Status: DC

## 2023-12-11 NOTE — Progress Notes (Signed)
    Procedures performed today:    None.  Independent interpretation of notes and tests performed by another provider:   None.  Brief History, Exam, Impression, and Recommendations:    Trochanteric bursitis of both hips This is a very pleasant 73 year old female, history of bilateral hip arthroplasty in 2018, we saw her in the summer of last year, we diagnosed her with trochanteric bursitis, and suggested hip abductor strengthening. We set her up with [facility] rehab specialist, she returns today with persistent pain left lateral hip. Hips are still pretty weak with abduction. We again discussed the anatomy and pathophysiology. She understands the importance of aggressive physical therapy, we will set her up here, I would like updated x-rays of her left pelvis to ensure no obvious signs of periprosthetic loosening. We will have her start some Celebrex , she will restart her gabapentin . Return to see me in about 6 to 8 weeks, if insufficient improvement with therapy and medication we will consider hip abductor/trochanteric bursa injection with ultrasound guidance. Ultimately if everything fails we will consider three-phase bone scan of the left hip arthroplasty.  History of bilateral hip arthroplasty History of arthroplasty back in 2018, pain laterally and down the anterior femur, we will get x-rays to evaluate for aseptic loosening, if we are unable to treat her as documented below under trochanteric bursitis, we will consider a three-phase bone scan.  Lumbar post-laminectomy syndrome History of lumbar spinal stenosis status post lumbar laminectomy, she is working with Dr. Rosella, she has a spinal cord stimulator, we are going to add gabapentin . She is already taking an SNRI. We have also added Celebrex . I do think she should keep the majority of her spinal treatment with Dr. Rosella.    ____________________________________________ Debby PARAS. Curtis, M.D., ABFM., CAQSM.,  AME. Primary Care and Sports Medicine [facility]  Adjunct Professor of [facility] Medicine  [employer]  Restaurant Manager, [employer]

## 2023-12-11 NOTE — Assessment & Plan Note (Signed)
 This is a very pleasant 73 year old female, history of bilateral hip arthroplasty in 2018, we saw her in the summer of last year, we diagnosed her with trochanteric bursitis, and suggested hip abductor strengthening. We set her up with [facility] rehab specialist, she returns today with persistent pain left lateral hip. Hips are still pretty weak with abduction. We again discussed the anatomy and pathophysiology. She understands the importance of aggressive physical therapy, we will set her up here, I would like updated x-rays of her left pelvis to ensure no obvious signs of periprosthetic loosening. We will have her start some Celebrex , she will restart her gabapentin . Return to see me in about 6 to 8 weeks, if insufficient improvement with therapy and medication we will consider hip abductor/trochanteric bursa injection with ultrasound guidance. Ultimately if everything fails we will consider three-phase bone scan of the left hip arthroplasty.

## 2023-12-11 NOTE — Assessment & Plan Note (Signed)
 History of lumbar spinal stenosis status post lumbar laminectomy, she is working with Dr. Rosella, she has a spinal cord stimulator, we are going to add gabapentin . She is already taking an SNRI. We have also added Celebrex . I do think she should keep the majority of her spinal treatment with Dr. Rosella.

## 2023-12-11 NOTE — Assessment & Plan Note (Signed)
 History of arthroplasty back in 2018, pain laterally and down the anterior femur, we will get x-rays to evaluate for aseptic loosening, if we are unable to treat her as documented below under trochanteric bursitis, we will consider a three-phase bone scan.

## 2023-12-12 ENCOUNTER — Other Ambulatory Visit: Payer: Self-pay

## 2023-12-12 ENCOUNTER — Encounter: Payer: Self-pay | Admitting: Physical Therapy

## 2023-12-12 ENCOUNTER — Ambulatory Visit: Payer: Medicare Other | Attending: Sports Medicine | Admitting: Physical Therapy

## 2023-12-12 DIAGNOSIS — M6281 Muscle weakness (generalized): Secondary | ICD-10-CM | POA: Diagnosis not present

## 2023-12-12 DIAGNOSIS — M25552 Pain in left hip: Secondary | ICD-10-CM | POA: Insufficient documentation

## 2023-12-12 DIAGNOSIS — M25551 Pain in right hip: Secondary | ICD-10-CM | POA: Diagnosis not present

## 2023-12-12 DIAGNOSIS — M7061 Trochanteric bursitis, right hip: Secondary | ICD-10-CM | POA: Insufficient documentation

## 2023-12-12 NOTE — Therapy (Signed)
 OUTPATIENT PHYSICAL THERAPY LOWER EXTREMITY EVALUATION   Patient Name: Heather Thompson MRN: [id number] DOB:23-Aug-1951, 73 y.o., female Today's Date: 12/12/2023  END OF SESSION:  PT End of Session - 12/12/23 1202     Visit Number 1    Number of Visits 16    Date for PT Re-Evaluation 02/06/24    Authorization Type Medicare    Authorization - Visit Number 1    Progress Note Due on Visit 10    PT Start Time 1015    PT Stop Time 1105    PT Time Calculation (min) 50 min    Activity Tolerance Patient tolerated treatment well    Behavior During Therapy [facility] for tasks assessed/performed             Past Medical History:  Diagnosis Date   Abnormality of gait 04/23/2019   Acquired hypothyroidism 07/29/2021   Allergic rhinitis due to animal hair and dander 12/02/2015   Formatting of this note might be different from the original.  [facility]  Seen by Dr. Travis Friedman 11/11/15     Plan- Received allergy shots today. Follow up 1 year.     Allergy    Anemia    Anemia    Anxiety    Arthritis of scaphoid-trapezium-trapezoid joint of right hand 09/10/2023   B12 deficiency    Back pain    Chronic fatigue 07/29/2021   Chronic fatigue syndrome    Chronic throat clearing 03/14/2023   Clotting disorder (HCC)    Constipation    Depression with anxiety 04/07/2009   Formatting of this note might be different from the original.  well controlled on current meds, follows with psychiatrist in [place]. Had a   h/o severe depression 5 years ago, and had ECT.     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  B12 deficiency   Iron deficiency     Herniated lumbar intervertebral disc  11/12/2020   High cholesterol    History of blood clots    History of cervical spinal surgery 01/01/2018   Formatting of this note might be different from the original.  Fusion C6 and C7 per 2018 MRI report.     History of left hip replacement 01/14/2018   History of Roux-en-Y gastric bypass    Hoarseness 03/14/2023   Hyperlipidemia 08/19/2010   Hypertension    Hyponatremia 01/17/2018   Hypothyroidism    Hypothyroidism    IBS (irritable bowel syndrome) 04/06/2021   Impaired fasting glucose 08/19/2010   Incontinence 10/30/2013   Formatting of this note might be different from the original. Formatting of this note might be different from the original. Urinary Incontinence     Infertility, female    Insomnia 04/07/2009   Formatting of this note might be different from the original.  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  Followed by [facility]  Seen by Dr. Nyle Belling 03/01/15  S/p changing thoracic spinal cord stimulator generator 08/18/14.     Lumbar spine films show spinal cord generator in left flank area. 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  Follows with [facility] of this note might be different from the original.  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  Getting iron infusion via port at hematalogy/oncology center at [facility].   Was recently hospitalized in [facility] for severe anemia     Formatting of this note might be different from the original.  on OTC b12 currently     Vitamin D  deficiency    Past Surgical History:  Procedure Laterality Date   ABDOMINAL HYSTERECTOMY     APPENDECTOMY     CARPAL TUNNEL RELEASE Bilateral 2023   with thumb joint replacement   CERVICAL FUSION      CHOLECYSTECTOMY     FOOT SURGERY Bilateral    fusion   GASTRIC BYPASS     SPINAL CORD STIMULATOR INSERTION  07/2023   TOTAL HIP ARTHROPLASTY Bilateral    11/2016 & 05/2017   Patient Active Problem List   Diagnosis Date Noted   Palpitations 10/04/2023   Cardiac murmur 10/04/2023   Allergy    Anemia    Anxiety    B12 deficiency    Back pain    Chronic fatigue syndrome    Clotting disorder (HCC)    Constipation    Edema of both lower extremities    Fatty liver    Fibromyalgia    Gallbladder problem  High cholesterol    History of blood clots    History of Roux-en-Y gastric bypass    Hypothyroidism    Infertility, female    Joint pain    Kidney stones    Muscle disorder    Osteoarthritis    Prediabetes    Salzmann nodular degeneration    SOBOE (shortness of breath on exertion)    Swallowing difficulty    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   Arthritis of scaphoid-trapezium-trapezoid joint of right hand 09/10/2023   Urge incontinence 08/28/2023   OAB (overactive bladder) 08/28/2023   Lumbar post-laminectomy syndrome 07/06/2023   Snapping hip syndrome, 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   Other myositis, left thigh 04/24/2023   Sacroiliitis, not elsewhere classified (HCC) 04/24/2023   Dysphagia 04/17/2023   Elevated liver enzymes 04/02/2023   Elevated TSH 04/02/2023   Chronic throat clearing 03/14/2023   Globus sensation 03/14/2023   Hoarseness 03/14/2023   Muscle tension dysphonia 03/12/2023   Unspecified inflammatory spondylopathy, 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   Primary osteoarthritis of both knees 12/21/2021   Port-A-Cath in place 07/29/2021   Acquired hypothyroidism 07/29/2021   Chronic fatigue 07/29/2021   Vitamin B12 deficiency 07/29/2021   Vitamin D  deficiency 07/29/2021   Postmenopausal estrogen deficiency 07/29/2021   IBS (irritable bowel syndrome) 04/06/2021   Retention of urine 04/06/2021   Herniated lumbar intervertebral disc 11/12/2020   Skin  sensation disturbance 11/12/2020   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   Spondylopathy, unspecified 11/12/2020   Trochanteric bursitis of both hips 11/19/2019   Low serum iron 04/23/2019   Osteomalacia 04/23/2019   Abnormality of gait 04/23/2019   History of bilateral hip arthroplasty 06/26/2018   Gastroesophageal reflux disease 06/18/2018   Hypertension 06/18/2018   Intertrigo 04/30/2018   Lentigines 04/30/2018   Hyponatremia 01/17/2018   History of left hip replacement 01/14/2018   History of cervical spinal surgery 01/01/2018   Obesity (BMI 30-39.9) 01/01/2018   SK (seborrheic keratosis) 04/18/2017   Pain management contract agreement 11/17/2016   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   Allergic rhinitis due to animal hair and dander 12/02/2015   Low back pain 03/03/2015   Incontinence 10/30/2013   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   Neutrophilic leukocytosis 08/12/2012   Thrombocytosis 08/12/2012   Osteoporosis, senile 11/15/2010   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   Hyperlipidemia 08/19/2010   Impaired fasting glucose 08/19/2010   Polyarthritis 09/28/2009   Degenerative disc disease 04/07/2009   Depression with anxiety 04/07/2009   Myopathy 04/07/2009   Insomnia 04/07/2009   Migraine 04/07/2009   Scoliosis 04/07/2009   Seasonal allergies 04/07/2009   Vitamin B12 deficiency anemia 04/07/2009    PCP: Cherre Cornish  REFERRING PROVIDER: Carlye Child DIAG: bilateral trochanteric bursitis  THERAPY DIAG:  Bilateral hip pain  Muscle weakness (generalized)  Rationale for Evaluation and Treatment: Rehabilitation  ONSET DATE: 04/2023  SUBJECTIVE:   SUBJECTIVE STATEMENT: Pt had bilateral hip replacements in 2018. Pt began having Lt hip and groin pain last summer that was so severe she went to ED. She was eventually diagnosed with bursitis. Since that time the pain has continued and is "always there". She now has bilateral hip pain, groin pain. Hips are tender to touch.  Pain increases with laying on her sides, pain with sit to stand, prolonged standing and walking. Pt has not found a way to relieve pain  PERTINENT HISTORY: Bilat THA 2018, fibromyalgia, OA, spinal cord stimulator, bilat TC joint fusion PAIN:  Are you having pain? Yes: NPRS scale: 5/10 currently, 8-9/10 at worst  Pain location: bilat hips Lt > Rt Pain description: sore, tender Aggravating factors: sitting in recliner, laying on sides, sit to stand Relieving factors: unknown  PRECAUTIONS: pt has spinal cord stimulator  RED FLAGS: None   WEIGHT BEARING RESTRICTIONS: No  FALLS:  Has patient fallen in last 6 months? No  OCCUPATION: retired  PLOF: Independent  PATIENT GOALS: reduce pain  NEXT MD VISIT: February 2025  OBJECTIVE:  Note: Objective measures were completed at Evaluation unless otherwise noted.  DIAGNOSTIC FINDINGS: x ray performed but not read at time as eval  PATIENT SURVEYS:  FOTO 39  COGNITION: Overall cognitive status: Within functional limits for tasks assessed     SENSATION: WFL   MUSCLE LENGTH: Hamstrings: decreased bilat Hip flexors: decreased bilat     PALPATION: TTP bilat hip flexors, ITB, glutes, HS Increased mm spasticity bilat hamstrings, glutes, quads  LOWER EXTREMITY ROM:  Active ROM Right eval Left eval  Hip flexion    Hip extension    Hip abduction    Hip adduction    Hip internal rotation    Hip external rotation 30 50  Knee flexion    Knee extension    Ankle dorsiflexion    Ankle plantarflexion    Ankle inversion    Ankle eversion     (Blank rows = not tested)  LOWER EXTREMITY MMT:  MMT Right eval Left eval  Hip flexion 3+ 3  Hip extension 3 3  Hip abduction 3 3  Hip adduction    Hip internal rotation    Hip external rotation 3 3+  Knee flexion    Knee extension    Ankle dorsiflexion    Ankle plantarflexion    Ankle inversion    Ankle eversion     (Blank rows = not tested)   FUNCTIONAL TESTS:  5  times sit to stand: 18.85 seconds                                                                                                                                [facility] Adult PT Treatment:                                                DATE: 12/12/23 Therapeutic Exercise: See HEP  Therapeutic Activity: Time spent educating pt on diagnosis and relevant anatomy, importance of daily walking to improve strength and mobility     PATIENT EDUCATION:  Education details: PT POC and goals, HEP Person educated: Patient Education method: Explanation, Demonstration, and Handouts Education comprehension: verbalized understanding and returned demonstration  HOME EXERCISE PROGRAM: Access Code: [email or web address] URL: [email or web address] Date: 12/12/2023 Prepared by: Lowery Rue  Exercises - Hip Abduction with Resistance Loop  - 1 x daily - 7 x weekly - 3 sets - 10 reps - Hip Extension with Resistance Loop  -  1 x daily - 7 x weekly - 3 sets - 10 reps - Sit to Stand  - 1 x daily - 7 x weekly - 2 sets - 10 reps - Seated Hamstring Stretch  - 1 x daily - 7 x weekly - 1 sets - 3 reps - 20-30 seconds hold  ASSESSMENT:  CLINICAL IMPRESSION: Patient is a 73 y.o. female who was seen today for physical therapy evaluation and treatment for trochanteric bursitis of bilateral hips. She presents with significant muscle spasticity and tenderness to palpation, decreased strength and mobility, increased pain and decreased activity tolerance. Pt will benefit from skilled PT to address deficits and improve functional mobility with decreased pain.   OBJECTIVE IMPAIRMENTS: decreased activity tolerance, decreased mobility, difficulty walking, decreased ROM, decreased strength, increased muscle spasms, impaired flexibility, and pain.   ACTIVITY LIMITATIONS: standing, sleeping, transfers, and locomotion level  PARTICIPATION LIMITATIONS: cleaning, shopping, and community activity  PERSONAL FACTORS: Time since  onset of injury/illness/exacerbation and 1-2 comorbidities: history of ankle fusions and THA bilat  are also affecting patient's functional outcome.   REHAB POTENTIAL: Good  CLINICAL DECISION MAKING: Stable/uncomplicated  EVALUATION COMPLEXITY: Low   GOALS: Goals reviewed with patient? Yes  SHORT TERM GOALS: Target date: 01/09/2024   Pt will be independent with initial HEP Baseline: Goal status: INITIAL  2.  Pt will improve FOTO to > = 45 to demo improving functional mobility Baseline:  Goal status: INITIAL   LONG TERM GOALS: Target date: 02/06/2024    Pt will be independent with advanced HEP and plan for continued exercise in community Baseline:  Goal status: INITIAL  2.  Pt will improve FOTO to >= 50 to demo improved functional mobility Baseline:  Goal status: INITIAL  3.  Pt will improve hip strength to 4+/5 bilat to improve standing and walking tolerance Baseline:  Goal status: INITIAL  4.  Pt will tolerate laying on both sides with pain <= 2/10 Baseline:  Goal status: INITIAL  5.  Pt will improve 5 x STS to <= 15 seconds Baseline:  Goal status: INITIAL    PLAN:  PT FREQUENCY: 2x/week  PT DURATION: 8 weeks  PLANNED INTERVENTIONS: 97164- PT Re-evaluation, 97110-Therapeutic exercises, 97530- Therapeutic activity, [id number]- Neuromuscular re-education, 97535- Self Care, [id number]- Manual therapy, [id number]- Aquatic Therapy, 97035- Ultrasound, [id number]- Ionotophoresis 4mg /ml Dexamethasone , Patient/Family education, Balance training, Taping, Dry Needling, Cryotherapy, and Moist heat  PLAN FOR NEXT SESSION: assess response to HEP, hip strengthening   Kyley Laurel, PT 12/12/2023, 12:03 PM

## 2023-12-14 DIAGNOSIS — G589 Mononeuropathy, unspecified: Secondary | ICD-10-CM | POA: Insufficient documentation

## 2023-12-15 ENCOUNTER — Other Ambulatory Visit (HOSPITAL_COMMUNITY): Payer: Self-pay | Admitting: Psychiatry

## 2023-12-18 ENCOUNTER — Encounter (INDEPENDENT_AMBULATORY_CARE_PROVIDER_SITE_OTHER): Payer: Self-pay | Admitting: Sports Medicine

## 2023-12-18 ENCOUNTER — Telehealth (INDEPENDENT_AMBULATORY_CARE_PROVIDER_SITE_OTHER): Payer: Medicare Other | Admitting: Psychology

## 2023-12-18 DIAGNOSIS — F5089 Other specified eating disorder: Secondary | ICD-10-CM | POA: Diagnosis not present

## 2023-12-18 DIAGNOSIS — F4323 Adjustment disorder with mixed anxiety and depressed mood: Secondary | ICD-10-CM | POA: Diagnosis not present

## 2023-12-18 DIAGNOSIS — M549 Dorsalgia, unspecified: Secondary | ICD-10-CM

## 2023-12-18 DIAGNOSIS — G8929 Other chronic pain: Secondary | ICD-10-CM

## 2023-12-18 NOTE — Progress Notes (Signed)
  Office: [phone]  /  Fax: [phone]    Date: December 18, 2023  Appointment Start Time: 9:31am Duration: 24 minutes Provider: Lawerance Cruel, Psy.D. Type of Session: Individual Therapy  Location of Patient: Home (private location) Location of Provider: Provider's Home (private office) Type of Contact: Telepsychological Visit via MyChart Video Visit  Session Content: Heather Thompson is a 73 y.o. female presenting for a follow-up appointment to address the previously established treatment goal of increasing coping skills.Today's appointment was a telepsychological visit. [name] provided verbal consent for today's telepsychological appointment and she is aware she is responsible for securing confidentiality on her end of the session. Prior to proceeding with today's appointment, [name]'s physical location at the time of this appointment was obtained as well a phone number she could be reached at in the event of technical difficulties. [name] and this provider participated in today's telepsychological service.   This provider conducted a brief check-in. [name] shared she had a nerve stimulator implanted in her neck to help with her spine. Reviewed recent eating habits. She acknowledged her eating habits have been "not great." Further explored and processed. She discussed feeling "discouraged" due to her health. It was reflected [name] is not eating enough protein. She was engaged in problem solving to increase protein intake (e.g., make hard boiled eggs bulk; purchase [place] yogurt and frozen meals from her meal plan; double recipes). Overall, [name] was receptive to today's appointment as evidenced by openness to sharing, responsiveness to feedback, and willingness to implement discussed strategies .  Mental Status Examination:  Appearance: neat Behavior: appropriate to circumstances Mood: sad; "slump" Affect: mood congruent Speech: WNL Eye Contact: appropriate Psychomotor Activity: WNL Gait: unable to  assess Thought Process: linear, logical, and goal directed and no evidence or endorsement of suicidal, homicidal, and self-harm ideation, plan and intent  Thought Content/Perception: no hallucinations, delusions, bizarre thinking or behavior endorsed or observed Orientation: AAOx4 Memory/Concentration: intact Insight: fair Judgment: fair  Interventions:  Conducted a brief chart review Provided empathic reflections and validation Provided positive reinforcement Employed supportive psychotherapy interventions to facilitate reduced distress and to improve coping skills with identified stressors Engaged patient in problem solving  DSM-5 Diagnosis(es):  F50.89 Other Specified Feeding or Eating Disorder, Emotional Eating Behaviors and  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 Progress is limited, as [name] has just begun treatment with this provider; however, she is receptive to the interaction and interventions and rapport is being established.   Plan: The next appointment is scheduled for 01/01/2024 at 9:30am, which will be via MyChart Video Visit. The next session will focus on working towards the established treatment goal.   Lawerance Cruel, PsyD

## 2023-12-19 ENCOUNTER — Ambulatory Visit: Payer: Medicare Other | Admitting: Physical Therapy

## 2023-12-19 ENCOUNTER — Encounter: Payer: Self-pay | Admitting: Physical Therapy

## 2023-12-19 DIAGNOSIS — M6281 Muscle weakness (generalized): Secondary | ICD-10-CM

## 2023-12-19 DIAGNOSIS — M25551 Pain in right hip: Secondary | ICD-10-CM

## 2023-12-19 DIAGNOSIS — M7061 Trochanteric bursitis, right hip: Secondary | ICD-10-CM | POA: Diagnosis not present

## 2023-12-19 NOTE — Therapy (Signed)
OUTPATIENT PHYSICAL THERAPY LOWER EXTREMITY TREATMENT   Patient Name: Heather Thompson MRN: [id number] DOB:1951-04-19, 73 y.o., female Today's Date: 12/19/2023  END OF SESSION:  PT End of Session - 12/19/23 1222     Visit Number 2    Number of Visits 16    Date for PT Re-Evaluation 02/06/24    Authorization Type Medicare    Authorization - Visit Number 2    Progress Note Due on Visit 10    PT Start Time 1141    PT Stop Time 1222    PT Time Calculation (min) 41 min    Activity Tolerance Patient tolerated treatment well    Behavior During Therapy [facility] for tasks assessed/performed              Past Medical History:  Diagnosis Date   Abnormality of gait 04/23/2019   Acquired hypothyroidism 07/29/2021   Allergic rhinitis due to animal hair and dander 12/02/2015   Formatting of this note might be different from the original.  [facility]  Seen by Dr. Rockwell Germany 11/11/15     Plan- Received allergy shots today. Follow up 1 year.     Allergy    Anemia    Anemia    Anxiety    Arthritis of scaphoid-trapezium-trapezoid joint of right hand 09/10/2023   B12 deficiency    Back pain    Chronic fatigue 07/29/2021   Chronic fatigue syndrome    Chronic throat clearing 03/14/2023   Clotting disorder (HCC)    Constipation    Depression with anxiety 04/07/2009   Formatting of this note might be different from the original.  well controlled on current meds, follows with psychiatrist in [place]. Had a   h/o severe depression 5 years ago, and had ECT.     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  B12 deficiency   Iron deficiency     Herniated lumbar intervertebral disc  11/12/2020   High cholesterol    History of blood clots    History of cervical spinal surgery 01/01/2018   Formatting of this note might be different from the original.  Fusion C6 and C7 per 2018 MRI report.     History of left hip replacement 01/14/2018   History of Roux-en-Y gastric bypass    Hoarseness 03/14/2023   Hyperlipidemia 08/19/2010   Hypertension    Hyponatremia 01/17/2018   Hypothyroidism    Hypothyroidism    IBS (irritable bowel syndrome) 04/06/2021   Impaired fasting glucose 08/19/2010   Incontinence 10/30/2013   Formatting of this note might be different from the original. Formatting of this note might be different from the original. Urinary Incontinence     Infertility, female    Insomnia 04/07/2009   Formatting of this note might be different from the original.  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  Followed by [facility]  Seen by Dr. Reginia Naas 03/01/15  S/p changing thoracic spinal cord stimulator generator 08/18/14.     Lumbar spine films show spinal cord generator in left flank area. 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  Follows with Rheumatology in [facility] of this note might be different from the original.  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  Getting iron infusion via port at hematalogy/oncology center at [facility].   Was recently hospitalized in [facility] for severe anemia     Formatting of this note might be different from the original.  on OTC b12 currently     Vitamin D deficiency    Past Surgical History:  Procedure Laterality Date   ABDOMINAL HYSTERECTOMY     APPENDECTOMY     CARPAL TUNNEL RELEASE Bilateral 2023   with thumb joint replacement   CERVICAL FUSION      CHOLECYSTECTOMY     FOOT SURGERY Bilateral    fusion   GASTRIC BYPASS     SPINAL CORD STIMULATOR INSERTION  07/2023   TOTAL HIP ARTHROPLASTY Bilateral    11/2016 & 05/2017   Patient Active Problem List   Diagnosis Date Noted   Palpitations 10/04/2023   Cardiac murmur 10/04/2023   Allergy    Anemia    Anxiety    B12 deficiency    Back pain    Chronic fatigue syndrome    Clotting disorder (HCC)    Constipation    Edema of both lower extremities    Fatty liver    Fibromyalgia    Gallbladder problem  High cholesterol    History of blood clots    History of Roux-en-Y gastric bypass    Hypothyroidism    Infertility, female    Joint pain    Kidney stones    Muscle disorder    Osteoarthritis    Prediabetes    Salzmann nodular degeneration    SOBOE (shortness of breath on exertion)    Swallowing difficulty    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   Arthritis of scaphoid-trapezium-trapezoid joint of right hand 09/10/2023   Urge incontinence 08/28/2023   OAB (overactive bladder) 08/28/2023   Lumbar post-laminectomy syndrome 07/06/2023   Snapping hip syndrome, 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   Other myositis, left thigh 04/24/2023   Sacroiliitis, not elsewhere classified (HCC) 04/24/2023   Dysphagia 04/17/2023   Elevated liver enzymes 04/02/2023   Elevated TSH 04/02/2023   Chronic throat clearing 03/14/2023   Globus sensation 03/14/2023   Hoarseness 03/14/2023   Muscle tension dysphonia 03/12/2023   Unspecified inflammatory spondylopathy, 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   Primary osteoarthritis of both knees 12/21/2021   Port-A-Cath in place 07/29/2021   Acquired hypothyroidism 07/29/2021   Chronic fatigue 07/29/2021   Vitamin B12 deficiency 07/29/2021   Vitamin D deficiency 07/29/2021   Postmenopausal estrogen deficiency 07/29/2021   IBS (irritable bowel syndrome) 04/06/2021   Retention of urine 04/06/2021   Herniated lumbar intervertebral disc 11/12/2020   Skin  sensation disturbance 11/12/2020   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   Spondylopathy, unspecified 11/12/2020   Trochanteric bursitis of both hips 11/19/2019   Low serum iron 04/23/2019   Osteomalacia 04/23/2019   Abnormality of gait 04/23/2019   History of bilateral hip arthroplasty 06/26/2018   Gastroesophageal reflux disease 06/18/2018   Hypertension 06/18/2018   Intertrigo 04/30/2018   Lentigines 04/30/2018   Hyponatremia 01/17/2018   History of left hip replacement 01/14/2018   History of cervical spinal surgery 01/01/2018   Obesity (BMI 30-39.9) 01/01/2018   SK (seborrheic keratosis) 04/18/2017   Pain management contract agreement 11/17/2016   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   Allergic rhinitis due to animal hair and dander 12/02/2015   Low back pain 03/03/2015   Incontinence 10/30/2013   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   Neutrophilic leukocytosis 08/12/2012   Thrombocytosis 08/12/2012   Osteoporosis, senile 11/15/2010   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   Hyperlipidemia 08/19/2010   Impaired fasting glucose 08/19/2010   Polyarthritis 09/28/2009   Degenerative disc disease 04/07/2009   Depression with anxiety 04/07/2009   Myopathy 04/07/2009   Insomnia 04/07/2009   Migraine 04/07/2009   Scoliosis 04/07/2009   Seasonal allergies 04/07/2009   Vitamin B12 deficiency anemia 04/07/2009    PCP: Christen Butter  REFERRING PROVIDER: Verdell Carmine DIAG: bilateral trochanteric bursitis  THERAPY DIAG:  Bilateral hip pain  Muscle weakness (generalized)  Rationale for Evaluation and Treatment: Rehabilitation  ONSET DATE: 04/2023  SUBJECTIVE:   SUBJECTIVE STATEMENT: Pt states she feels ok. She has been performing HEP without issue  PERTINENT HISTORY: Bilat THA 2018, fibromyalgia, OA, spinal cord stimulator, bilat TC joint fusion  Pt had bilateral hip replacements in 2018. Pt began having Lt hip and groin pain last summer that was so severe she went to ED. She  was eventually diagnosed with bursitis. Since that time the pain has continued and is "always there". She now has bilateral hip pain, groin pain. Hips are tender to touch. Pain increases with laying on her sides, pain with sit to stand, prolonged standing and walking. Pt has not found a way to relieve pain PAIN:  Are you having pain? Yes: NPRS scale: 4/10 currently, 8-9/10 at worst Pain location: bilat hips Lt > Rt Pain description: sore, tender Aggravating factors: sitting in recliner, laying on sides, sit to stand Relieving factors: unknown  PRECAUTIONS: pt has spinal cord stimulator  RED FLAGS: None   WEIGHT BEARING RESTRICTIONS: No  FALLS:  Has patient fallen in last 6 months? No  OCCUPATION: retired  PLOF: Independent  PATIENT GOALS: reduce pain  NEXT MD VISIT: February 2025  OBJECTIVE:  Note: Objective measures were completed at Evaluation unless otherwise noted.  DIAGNOSTIC FINDINGS: x ray performed but not read at time as eval  PATIENT SURVEYS:  FOTO 39   MUSCLE LENGTH: Hamstrings: decreased bilat Hip flexors: decreased bilat   PALPATION: TTP bilat hip flexors, ITB, glutes, HS Increased mm spasticity bilat hamstrings, glutes, quads  LOWER EXTREMITY ROM:  Active ROM Right eval Left eval  Hip flexion    Hip extension    Hip abduction    Hip adduction    Hip internal rotation    Hip external rotation 30 50  Knee flexion    Knee extension    Ankle dorsiflexion    Ankle plantarflexion    Ankle inversion    Ankle eversion     (Blank rows = not tested)  LOWER EXTREMITY MMT:  MMT Right eval Left eval  Hip flexion 3+ 3  Hip extension 3 3  Hip abduction 3 3  Hip adduction    Hip internal rotation    Hip external rotation 3 3+  Knee flexion    Knee extension    Ankle dorsiflexion    Ankle plantarflexion    Ankle inversion    Ankle eversion     (Blank rows = not tested)   FUNCTIONAL TESTS:  5 times sit to stand: 18.85 seconds                                                                                                                                 [facility] Adult PT Treatment:                                                DATE: 12/19/23 Therapeutic Exercise: Nustep L6 x 5 min for warm up and subjective intake Hip abd red TB 2 x 10 Hip ext red TB 2 x 10 Reclined sitting: clam red TB x 20, SLR 2 x 10 Seated HS stretch 2 x 30 sec bilat Seated figure 4 stretch 2 x 30 sec bilat Resisted walking 10# backward/forward x 8, laterally 5# x 5 bilat Sit <> stand 2 x 10   [facility] Adult PT Treatment:  DATE: 12/12/23 Therapeutic Exercise: See HEP  Therapeutic Activity: Time spent educating pt on diagnosis and relevant anatomy, importance of daily walking to improve strength and mobility     PATIENT EDUCATION:  Education details: PT POC and goals, HEP Person educated: Patient Education method: Explanation, Demonstration, and Handouts Education comprehension: verbalized understanding and returned demonstration  HOME EXERCISE PROGRAM: Access Code: [email or web address] URL: [email or web address] Date: 12/12/2023 Prepared by: Reggy Eye  Exercises - Hip Abduction with Resistance Loop  - 1 x daily - 7 x weekly - 3 sets - 10 reps - Hip Extension with Resistance Loop  - 1 x daily - 7 x weekly - 3 sets - 10 reps - Sit to Stand  - 1 x daily - 7 x weekly - 2 sets - 10 reps - Seated Hamstring Stretch  - 1 x daily - 7 x weekly - 1 sets - 3 reps - 20-30 seconds hold  ASSESSMENT:  CLINICAL IMPRESSION: Pt with good tolerance to progression of exercises during session. No increased symptoms or pain. Plan to add balance and hip stability next visit  OBJECTIVE IMPAIRMENTS: decreased activity tolerance, decreased mobility, difficulty walking, decreased ROM, decreased strength, increased muscle spasms, impaired flexibility, and pain.     GOALS: Goals reviewed with patient? Yes  SHORT  TERM GOALS: Target date: 01/09/2024   Pt will be independent with initial HEP Baseline: Goal status: INITIAL  2.  Pt will improve FOTO to > = 45 to demo improving functional mobility Baseline:  Goal status: INITIAL   LONG TERM GOALS: Target date: 02/06/2024    Pt will be independent with advanced HEP and plan for continued exercise in community Baseline:  Goal status: INITIAL  2.  Pt will improve FOTO to >= 50 to demo improved functional mobility Baseline:  Goal status: INITIAL  3.  Pt will improve hip strength to 4+/5 bilat to improve standing and walking tolerance Baseline:  Goal status: INITIAL  4.  Pt will tolerate laying on both sides with pain <= 2/10 Baseline:  Goal status: INITIAL  5.  Pt will improve 5 x STS to <= 15 seconds Baseline:  Goal status: INITIAL    PLAN:  PT FREQUENCY: 2x/week  PT DURATION: 8 weeks  PLANNED INTERVENTIONS: 97164- PT Re-evaluation, 97110-Therapeutic exercises, 97530- Therapeutic activity, 97112- Neuromuscular re-education, 97535- Self Care, [id number]- Manual therapy, [id number]- Aquatic Therapy, 97035- Ultrasound, [id number]- Ionotophoresis 4mg /ml Dexamethasone, Patient/Family education, Balance training, Taping, Dry Needling, Cryotherapy, and Moist heat  PLAN FOR NEXT SESSION: balance, hip stability, hip strengthening   Merideth Bosque, PT 12/19/2023, 12:22 PM

## 2023-12-20 ENCOUNTER — Other Ambulatory Visit: Payer: Self-pay | Admitting: Nurse Practitioner

## 2023-12-20 ENCOUNTER — Telehealth: Payer: Self-pay | Admitting: Nurse Practitioner

## 2023-12-20 ENCOUNTER — Ambulatory Visit: Payer: Medicare Other | Admitting: Nurse Practitioner

## 2023-12-20 ENCOUNTER — Encounter: Payer: Self-pay | Admitting: Nurse Practitioner

## 2023-12-20 VITALS — BP 132/82 | HR 70 | Temp 97.9°F | Ht 64.0 in | Wt 184.0 lb

## 2023-12-20 DIAGNOSIS — E669 Obesity, unspecified: Secondary | ICD-10-CM

## 2023-12-20 DIAGNOSIS — R632 Polyphagia: Secondary | ICD-10-CM

## 2023-12-20 DIAGNOSIS — Z6831 Body mass index (BMI) 31.0-31.9, adult: Secondary | ICD-10-CM | POA: Diagnosis not present

## 2023-12-20 MED ORDER — LIRAGLUTIDE 18 MG/3ML ~~LOC~~ SOPN: PEN_INJECTOR | SUBCUTANEOUS | 0 refills | Status: DC

## 2023-12-20 MED ORDER — LIRAGLUTIDE 18 MG/3ML ~~LOC~~ SOPN: 0.6000 mg | PEN_INJECTOR | Freq: Every day | SUBCUTANEOUS | 0 refills | Status: DC

## 2023-12-20 MED ORDER — INSULIN PEN NEEDLE 31G X 5 MM MISC: 0 refills | Status: DC

## 2023-12-20 NOTE — Progress Notes (Signed)
Office: [phone]  /  Fax: [phone]  WEIGHT SUMMARY AND BIOMETRICS  Weight Lost Since Last Visit: 0lb  Weight Gained Since Last Visit: 8lb   Vitals Temp: 97.9 F (36.6 C) BP: 132/82 Pulse Rate: 70 SpO2: 98 %   Anthropometric Measurements Height: 5\' 4"  (1.626 m) Weight: 184 lb (83.5 kg) BMI (Calculated): 31.57 Weight at Last Visit: 176lb Weight Lost Since Last Visit: 0lb Weight Gained Since Last Visit: 8lb Starting Weight: 178lb Total Weight Loss (lbs): 0 lb (0 kg)   Body Composition  Body Fat %: 47 % Fat Mass (lbs): 86.8 lbs Muscle Mass (lbs): 93 lbs Total Body Water (lbs): 3.2 lbs Visceral Fat Rating : 14   Other Clinical Data Fasting: Yes Labs: No Today's Visit #: 9 Starting Date: 03/27/23     HPI  Chief Complaint: OBESITY  Heather Thompson is here to discuss her progress with her obesity treatment plan. She is on the the Category 2 Plan and states she is following her eating plan approximately 50 % of the time. She states she is exercising 20 minutes 3-4 days per week.   Interval History:  Since last office visit she has gained 8 pounds. She saw Dr. Dewaine Conger last on 12/18/23. She is wanting to give up on "eating well".  She is trying to be mindful of what she is eating.  She knows she is not getting enough protein.  She has gotten off track and has been "in a slump".  She hasn't been seeing her therapist.  She didn't feel that she had a good holiday.  She recently had a great grandson-Braydon-live in [place].  She's sad she can't see him on a regular basis.  She misses living in [place].  Her daughter calls her everyday.    [place] going to [place] in [place], [place] and [place] to visit family.   Pharmacotherapy for weight loss: She is not currently taking any meds for medical weight loss.    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 Her highest weight was 267 lbs and nadir weight was 154 lbs. Her  weight has fluctuated over the years. Highest weight since having surgery was 216 lbs.     PHYSICAL EXAM:  Blood pressure 132/82, pulse 70, temperature 97.9 F (36.6 C), height 5\' 4"  (1.626 m), weight 184 lb (83.5 kg), SpO2 98%. Body mass index is 31.58 kg/m.  General: She is overweight, cooperative, alert, well developed, and in no acute distress. PSYCH: Has normal mood, affect and thought process.   Extremities: No edema.  Neurologic: No gross sensory or motor deficits. No tremors or fasciculations noted.    DIAGNOSTIC DATA REVIEWED:  BMET    Component Value Date/Time   NA 136 08/14/2023 1357   K 4.8 08/14/2023 1357   CL 95 (L) 08/14/2023 1357   CO2 26 08/14/2023 1357   GLUCOSE 101 (H) 08/14/2023 1357   GLUCOSE 92 02/20/2023 1128   BUN 17 08/14/2023 1357   CREATININE 0.68 08/14/2023 1357   CREATININE 0.80 02/20/2023 1128   CALCIUM 9.6 08/14/2023 1357   GFRNONAA >60 09/07/2021 1433   Lab Results  Component Value Date   HGBA1C 6.1 (A) 08/14/2023   HGBA1C 6.1 08/14/2023   HGBA1C 6.0 (H) 04/06/2022   Lab Results  Component Value Date   INSULIN 8.7 03/27/2023   Lab Results  Component Value Date   TSH 4.550 (H) 03/27/2023   CBC  Component Value Date/Time   WBC 5.2 02/14/2023 1352   WBC 4.7 12/19/2022 1157   RBC 3.99 02/14/2023 1352   RBC 3.97 02/14/2023 1351   HGB 12.0 02/14/2023 1352   HCT 36.7 02/14/2023 1352   PLT 200 02/14/2023 1352   MCV 92.0 02/14/2023 1352   MCH 30.1 02/14/2023 1352   MCHC 32.7 02/14/2023 1352   RDW 13.0 02/14/2023 1352   Iron Studies    Component Value Date/Time   IRON 93 02/14/2023 1352   TIBC 392 02/14/2023 1352   FERRITIN 123 02/14/2023 1352   IRONPCTSAT 24 02/14/2023 1352   IRONPCTSAT 25 12/19/2022 1157   Lipid Panel     Component Value Date/Time   CHOL 213 (H) 02/20/2023 1128   TRIG 106 02/20/2023 1128   HDL 89 02/20/2023 1128   CHOLHDL 2.4 02/20/2023 1128   LDLCALC 104 (H) 02/20/2023 1128   Hepatic Function  Panel     Component Value Date/Time   PROT 6.9 08/14/2023 1357   ALBUMIN 4.2 08/14/2023 1357   AST 30 08/14/2023 1357   AST 30 09/07/2021 1433   ALT 29 08/14/2023 1357   ALT 31 09/07/2021 1433   ALKPHOS 107 08/14/2023 1357   BILITOT 0.4 08/14/2023 1357   BILITOT 0.3 09/07/2021 1433   BILIDIR 0.12 04/10/2023 1624      Component Value Date/Time   TSH 4.550 (H) 03/27/2023 0957   Nutritional Lab Results  Component Value Date   VD25OH 46.8 03/27/2023   VD25OH 78 07/10/2022     ASSESSMENT AND PLAN  TREATMENT PLAN FOR OBESITY:  Recommended Dietary Goals  Lamaya is currently in the action stage of change. As such, her goal is to continue weight management plan. She has agreed to the Category 2 Plan.  Behavioral Intervention  We discussed the following Behavioral Modification Strategies today: increasing lean protein intake to established goals, decreasing simple carbohydrates , increasing vegetables, increasing fiber rich foods, increasing water intake , work on meal planning and preparation, reading food labels , keeping healthy foods at home, continue to practice mindfulness when eating, planning for success, better snacking choices, and continue to work on maintaining a reduced calorie state, getting the recommended amount of protein, incorporating whole foods, making healthy choices, staying well hydrated and practicing mindfulness when eating..  Additional resources provided today: NA  Recommended Physical Activity Goals  Jilene has been advised to work up to 150 minutes of moderate intensity aerobic activity a week and strengthening exercises 2-3 times per week for cardiovascular health, weight loss maintenance and preservation of muscle mass.   She has agreed to Think about enjoyable ways to increase daily physical activity and overcoming barriers to exercise, Increase physical activity in their day and reduce sedentary time (increase NEAT)., Increase the intensity,  frequency or duration of strengthening exercises , and Increase the intensity, frequency or duration of aerobic exercises     Pharmacotherapy We discussed various medication options to help [facility] with her weight loss efforts and we both agreed to restart Victoza off label for weight loss.  Side effects discussed.    ASSOCIATED CONDITIONS ADDRESSED TODAY  Action/Plan  Polyphagia -     Liraglutide; Inject 0.6 mg into the skin daily.  Dispense: 3 mL; Refill: 0 -     Insulin Pen Needle; Use as directed with Victoza  Dispense: 50 each; Refill: 0  Generalized obesity -     Liraglutide; Inject 0.6 mg into the skin daily.  Dispense: 3 mL; Refill: 0 -  Insulin Pen Needle; Use as directed with Victoza  Dispense: 50 each; Refill: 0  BMI 31.0-31.9,adult      Keep appt with Dr. Dewaine Conger on 01/01/24 and Dr. Gilmore Laroche on 01/15/24   Return in about 4 weeks (around 01/17/2024).Marland Kitchen She was informed of the importance of frequent follow up visits to maximize her success with intensive lifestyle modifications for her multiple health conditions.   ATTESTASTION STATEMENTS:  Reviewed by clinician on day of visit: allergies, medications, problem list, medical history, surgical history, family history, social history, and previous encounter notes.     Theodis Sato. Delaila Nand FNP-C

## 2023-12-20 NOTE — Addendum Note (Signed)
Addended by: Irene Limbo on: 12/20/2023 02:10 PM   Modules accepted: Orders

## 2023-12-20 NOTE — Telephone Encounter (Signed)
The patient indicated that the pharmacy will not dispose of just one pen. The box contains two pens of 1.6, but they cannot dispose of only one. They would need to order it because they do not keep it in stock, if you were to send a prescription of it. Patient would really love to also talked to [place].

## 2023-12-20 NOTE — Telephone Encounter (Signed)
Spoke with patient, pharmacy cannot open package to dispense one pen. She cannot afford the $85 to get 2 pens.

## 2023-12-21 ENCOUNTER — Ambulatory Visit: Payer: Medicare Other | Admitting: Physical Therapy

## 2023-12-21 ENCOUNTER — Encounter: Payer: Self-pay | Admitting: Physical Therapy

## 2023-12-21 DIAGNOSIS — M6281 Muscle weakness (generalized): Secondary | ICD-10-CM

## 2023-12-21 DIAGNOSIS — M25552 Pain in left hip: Secondary | ICD-10-CM

## 2023-12-21 DIAGNOSIS — M7061 Trochanteric bursitis, right hip: Secondary | ICD-10-CM | POA: Diagnosis not present

## 2023-12-21 NOTE — Therapy (Signed)
OUTPATIENT PHYSICAL THERAPY LOWER EXTREMITY TREATMENT   Patient Name: Heather Thompson MRN: [id number] DOB:1951-08-31, 73 y.o., female Today's Date: 12/21/2023  END OF SESSION:  PT End of Session - 12/21/23 1101     Visit Number 3    Number of Visits 16    Date for PT Re-Evaluation 02/06/24    Authorization Type Medicare    Authorization - Visit Number 3    Progress Note Due on Visit 10    PT Start Time 1018    PT Stop Time 1101    PT Time Calculation (min) 43 min    Activity Tolerance Patient tolerated treatment well    Behavior During Therapy WFL for tasks assessed/performed               Past Medical History:  Diagnosis Date   Abnormality of gait 04/23/2019   Acquired hypothyroidism 07/29/2021   Allergic rhinitis due to animal hair and dander 12/02/2015   Formatting of this note might be different from the original.  [facility]  Seen by Dr. Rockwell Germany 11/11/15     Plan- Received allergy shots today. Follow up 1 year.     Allergy    Anemia    Anemia    Anxiety    Arthritis of scaphoid-trapezium-trapezoid joint of right hand 09/10/2023   B12 deficiency    Back pain    Chronic fatigue 07/29/2021   Chronic fatigue syndrome    Chronic throat clearing 03/14/2023   Clotting disorder (HCC)    Constipation    Depression with anxiety 04/07/2009   Formatting of this note might be different from the original.  well controlled on current meds, follows with psychiatrist in [place]. Had a   h/o severe depression 5 years ago, and had ECT.     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  B12 deficiency   Iron deficiency     Herniated lumbar intervertebral  disc 11/12/2020   High cholesterol    History of blood clots    History of cervical spinal surgery 01/01/2018   Formatting of this note might be different from the original.  Fusion C6 and C7 per 2018 MRI report.     History of left hip replacement 01/14/2018   History of Roux-en-Y gastric bypass    Hoarseness 03/14/2023   Hyperlipidemia 08/19/2010   Hypertension    Hyponatremia 01/17/2018   Hypothyroidism    Hypothyroidism    IBS (irritable bowel syndrome) 04/06/2021   Impaired fasting glucose 08/19/2010   Incontinence 10/30/2013   Formatting of this note might be different from the original. Formatting of this note might be different from the original. Urinary Incontinence     Infertility, female    Insomnia 04/07/2009   Formatting of this note might be different from the original.  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  Followed by [facility]  Seen by Dr. Reginia Naas 03/01/15  S/p changing thoracic spinal cord stimulator generator 08/18/14.     Lumbar spine films show spinal cord generator in left flank area. 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  Follows with Rheumatology in [facility] [place] of this note might be different from the original.  Myoadenylate deaminase deficiency, diagnosed back in 1984, after muscle   biopsies     Neutrophilic leukocytosis 08/12/2012   OAB (overactive  bladder) 08/28/2023   Obesity (BMI 30-39.9) 01/01/2018   Osteoarthritis    Osteomalacia 04/23/2019   Osteoporosis, senile 11/15/2010   Other myositis, left thigh 04/24/2023   Pain management contract agreement 11/17/2016   Formatting of this note might be different from the original.  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  Getting iron infusion via port at hematalogy/oncology center at [facility].   Was recently hospitalized in [facility] for severe anemia     Formatting of this note might be different from the original.  on OTC b12 currently     Vitamin D deficiency    Past Surgical History:  Procedure Laterality Date   ABDOMINAL HYSTERECTOMY     APPENDECTOMY     CARPAL TUNNEL RELEASE Bilateral 2023   with thumb joint replacement   CERVICAL  FUSION     CHOLECYSTECTOMY     FOOT SURGERY Bilateral    fusion   GASTRIC BYPASS     SPINAL CORD STIMULATOR INSERTION  07/2023   TOTAL HIP ARTHROPLASTY Bilateral    11/2016 & 05/2017   Patient Active Problem List   Diagnosis Date Noted   Palpitations 10/04/2023   Cardiac murmur 10/04/2023   Allergy    Anemia    Anxiety    B12 deficiency    Back pain    Chronic fatigue syndrome    Clotting disorder (HCC)    Constipation    Edema of both lower extremities    Fatty liver    Fibromyalgia    Gallbladder problem  High cholesterol    History of blood clots    History of Roux-en-Y gastric bypass    Hypothyroidism    Infertility, female    Joint pain    Kidney stones    Muscle disorder    Osteoarthritis    Prediabetes    Salzmann nodular degeneration    SOBOE (shortness of breath on exertion)    Swallowing difficulty    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   Arthritis of scaphoid-trapezium-trapezoid joint of right hand 09/10/2023   Urge incontinence 08/28/2023   OAB (overactive bladder) 08/28/2023   Lumbar post-laminectomy syndrome 07/06/2023   Snapping hip syndrome, 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   Other myositis, left thigh 04/24/2023   Sacroiliitis, not elsewhere classified (HCC) 04/24/2023   Dysphagia 04/17/2023   Elevated liver enzymes 04/02/2023   Elevated TSH 04/02/2023   Chronic throat clearing 03/14/2023   Globus sensation 03/14/2023   Hoarseness 03/14/2023   Muscle tension dysphonia 03/12/2023   Unspecified inflammatory spondylopathy, 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   Primary osteoarthritis of both knees 12/21/2021   Port-A-Cath in place 07/29/2021   Acquired hypothyroidism 07/29/2021   Chronic fatigue 07/29/2021   Vitamin B12 deficiency 07/29/2021   Vitamin D deficiency 07/29/2021   Postmenopausal estrogen deficiency 07/29/2021   IBS (irritable bowel syndrome) 04/06/2021   Retention of urine 04/06/2021   Herniated lumbar intervertebral disc 11/12/2020    Skin sensation disturbance 11/12/2020   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   Spondylopathy, unspecified 11/12/2020   Trochanteric bursitis of both hips 11/19/2019   Low serum iron 04/23/2019   Osteomalacia 04/23/2019   Abnormality of gait 04/23/2019   History of bilateral hip arthroplasty 06/26/2018   Gastroesophageal reflux disease 06/18/2018   Hypertension 06/18/2018   Intertrigo 04/30/2018   Lentigines 04/30/2018   Hyponatremia 01/17/2018   History of left hip replacement 01/14/2018   History of cervical spinal surgery 01/01/2018   Obesity (BMI 30-39.9) 01/01/2018   SK (seborrheic keratosis) 04/18/2017   Pain management contract agreement 11/17/2016   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   Allergic rhinitis due to animal hair and dander 12/02/2015   Low back pain 03/03/2015   Incontinence 10/30/2013   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   Neutrophilic leukocytosis 08/12/2012   Thrombocytosis 08/12/2012   Osteoporosis, senile 11/15/2010   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   Hyperlipidemia 08/19/2010   Impaired fasting glucose 08/19/2010   Polyarthritis 09/28/2009   Degenerative disc disease 04/07/2009   Depression with anxiety 04/07/2009   Myopathy 04/07/2009   Insomnia 04/07/2009   Migraine 04/07/2009   Scoliosis 04/07/2009   Seasonal allergies 04/07/2009   Vitamin B12 deficiency anemia 04/07/2009    PCP: Christen Butter  REFERRING PROVIDER: Verdell Carmine DIAG: bilateral trochanteric bursitis  THERAPY DIAG:  Bilateral hip pain  Muscle weakness (generalized)  Rationale for Evaluation and Treatment: Rehabilitation  ONSET DATE: 04/2023  SUBJECTIVE:   SUBJECTIVE STATEMENT: Pt states she is feeling better  PERTINENT HISTORY: Bilat THA 2018, fibromyalgia, OA, spinal cord stimulator, bilat TC joint fusion  Pt had bilateral hip replacements in 2018. Pt began having Lt hip and groin pain last summer that was so severe she went to ED. She was eventually diagnosed  with bursitis. Since that time the pain has continued and is "always there". She now has bilateral hip pain, groin pain. Hips are tender to touch. Pain increases with laying on her sides, pain with sit to stand, prolonged standing and walking. Pt has not found a way to relieve pain PAIN:  Are you having pain? Yes:  NPRS scale: 3/10 currently, 8-9/10 at worst Pain location: bilat hips Lt > Rt Pain description: sore, tender Aggravating factors: sitting in recliner, laying on sides, sit to stand Relieving factors: unknown  PRECAUTIONS: pt has spinal cord stimulator  RED FLAGS: None   WEIGHT BEARING RESTRICTIONS: No  FALLS:  Has patient fallen in last 6 months? No  OCCUPATION: retired  PLOF: Independent  PATIENT GOALS: reduce pain  NEXT MD VISIT: February 2025  OBJECTIVE:  Note: Objective measures were completed at Evaluation unless otherwise noted.  DIAGNOSTIC FINDINGS: x ray performed but not read at time as eval  PATIENT SURVEYS:  FOTO 39   MUSCLE LENGTH: Hamstrings: decreased bilat Hip flexors: decreased bilat   PALPATION: TTP bilat hip flexors, ITB, glutes, HS Increased mm spasticity bilat hamstrings, glutes, quads  LOWER EXTREMITY ROM:  Active ROM Right eval Left eval  Hip flexion    Hip extension    Hip abduction    Hip adduction    Hip internal rotation    Hip external rotation 30 50  Knee flexion    Knee extension    Ankle dorsiflexion    Ankle plantarflexion    Ankle inversion    Ankle eversion     (Blank rows = not tested)  LOWER EXTREMITY MMT:  MMT Right eval Left eval  Hip flexion 3+ 3  Hip extension 3 3  Hip abduction 3 3  Hip adduction    Hip internal rotation    Hip external rotation 3 3+  Knee flexion    Knee extension    Ankle dorsiflexion    Ankle plantarflexion    Ankle inversion    Ankle eversion     (Blank rows = not tested)   FUNCTIONAL TESTS:  5 times sit to stand: 18.85 seconds    [facility] Adult PT Treatment:                                                 DATE: 12/21/23 Therapeutic Exercise: Nustep L6 x 5 min for warm up and subjective intake Standing hip ext with knee flexion x 12 bilat Standing hip abd with knee flexion x 12 bilat Slider hip 3 way x 7 bilat Reclined sitting: clam red TB x 20, SLR 2 x 10 Seated HS stretch 2 x 30 sec bilat Seated figure 4 stretch 2 x 30 sec bilat Resisted walking 12.5# backward/forward x 8, laterally 7.5# x 5 bilat                                                                                                                              [facility] Adult PT Treatment:  DATE: 12/19/23 Therapeutic Exercise: Nustep L6 x 5 min for warm up and subjective intake Hip abd red TB 2 x 10 Hip ext red TB 2 x 10 Reclined sitting: clam red TB x 20, SLR 2 x 10 Seated HS stretch 2 x 30 sec bilat Seated figure 4 stretch 2 x 30 sec bilat Resisted walking 10# backward/forward x 8, laterally 5# x 5 bilat Sit <> stand 2 x 10   [facility] Adult PT Treatment:                                                DATE: 12/12/23 Therapeutic Exercise: See HEP  Therapeutic Activity: Time spent educating pt on diagnosis and relevant anatomy, importance of daily walking to improve strength and mobility     PATIENT EDUCATION:  Education details: PT POC and goals, HEP Person educated: Patient Education method: Explanation, Demonstration, and Handouts Education comprehension: verbalized understanding and returned demonstration  HOME EXERCISE PROGRAM: Access Code: [email or web address] URL: [email or web address] Date: 12/21/2023 Prepared by: Reggy Eye  Exercises - Sit to Stand  - 1 x daily - 7 x weekly - 2 sets - 10 reps - Seated Hamstring Stretch  - 1 x daily - 7 x weekly - 1 sets - 3 reps - 20-30 seconds hold - Seated Figure 4 Piriformis Stretch  - 1 x daily - 7 x weekly - 1 sets - 3 reps - 20-30 seconds hold - Standing Hip Abduction with Bent  Knee  - 1 x daily - 7 x weekly - 3 sets - 10 reps - Standing Hip Extension with Leg Bent and Support  - 1 x daily - 7 x weekly - 3 sets - 10 reps - Single Leg 3 Way Reach with Support  - 1 x daily - 7 x weekly - 3 sets - 10 reps  ASSESSMENT:  CLINICAL IMPRESSION: Patient challenged by bent knee exercises and hip 3 way on slider - no pain but increased muscle fatigue. Added these to HEP to continue to progress strength. Pt with good tolerance to all activities during session  OBJECTIVE IMPAIRMENTS: decreased activity tolerance, decreased mobility, difficulty walking, decreased ROM, decreased strength, increased muscle spasms, impaired flexibility, and pain.     GOALS: Goals reviewed with patient? Yes  SHORT TERM GOALS: Target date: 01/09/2024   Pt will be independent with initial HEP Baseline: Goal status: INITIAL  2.  Pt will improve FOTO to > = 45 to demo improving functional mobility Baseline:  Goal status: INITIAL   LONG TERM GOALS: Target date: 02/06/2024    Pt will be independent with advanced HEP and plan for continued exercise in community Baseline:  Goal status: INITIAL  2.  Pt will improve FOTO to >= 50 to demo improved functional mobility Baseline:  Goal status: INITIAL  3.  Pt will improve hip strength to 4+/5 bilat to improve standing and walking tolerance Baseline:  Goal status: INITIAL  4.  Pt will tolerate laying on both sides with pain <= 2/10 Baseline:  Goal status: INITIAL  5.  Pt will improve 5 x STS to <= 15 seconds Baseline:  Goal status: INITIAL    PLAN:  PT FREQUENCY: 2x/week  PT DURATION: 8 weeks  PLANNED INTERVENTIONS: [id number]- PT Re-evaluation, 97110-Therapeutic exercises, 97530- Therapeutic activity, [id number]- Neuromuscular re-education, 97535- Self Care, [id number]- Manual therapy, [id number]- Aquatic Therapy, [id number]- Ultrasound, [id number]-  Ionotophoresis 4mg /ml Dexamethasone, Patient/Family education, Balance training, Taping, Dry Needling,  Cryotherapy, and Moist heat  PLAN FOR NEXT SESSION: balance, hip stability, hip strengthening   Karalyn Kadel, PT 12/21/2023, 11:02 AM

## 2023-12-25 ENCOUNTER — Ambulatory Visit: Payer: Medicare Other | Admitting: Physical Therapy

## 2023-12-27 ENCOUNTER — Encounter: Payer: Self-pay | Admitting: Physical Therapy

## 2023-12-27 ENCOUNTER — Ambulatory Visit: Payer: Medicare Other | Admitting: Physical Therapy

## 2023-12-27 DIAGNOSIS — M7061 Trochanteric bursitis, right hip: Secondary | ICD-10-CM | POA: Diagnosis not present

## 2023-12-27 DIAGNOSIS — M25551 Pain in right hip: Secondary | ICD-10-CM

## 2023-12-27 DIAGNOSIS — M6281 Muscle weakness (generalized): Secondary | ICD-10-CM

## 2023-12-27 NOTE — Therapy (Signed)
OUTPATIENT PHYSICAL THERAPY LOWER EXTREMITY TREATMENT   Patient Name: Heather Thompson MRN: [id number] DOB:[birth year], 73 y.o., female Today's Date: 12/27/2023  END OF SESSION:  PT End of Session - 12/27/23 1139     Visit Number 4    Number of Visits 16    Date for PT Re-Evaluation 02/06/24    Authorization Type Medicare    Authorization - Visit Number 4    Progress Note Due on Visit 10    PT Start Time 1100    PT Stop Time 1143    PT Time Calculation (min) 43 min    Activity Tolerance Patient tolerated treatment well    Behavior During Therapy WFL for tasks assessed/performed                Past Medical History:  Diagnosis Date   Abnormality of gait 04/23/2019   Acquired hypothyroidism 07/29/2021   Allergic rhinitis due to animal hair and dander 12/02/2015   Formatting of this note might be different from the original.  [facility]  Seen by Dr. Rockwell Germany 11/11/15     Plan- Received allergy shots today. Follow up 1 year.     Allergy    Anemia    Anemia    Anxiety    Arthritis of scaphoid-trapezium-trapezoid joint of right hand 09/10/2023   B12 deficiency    Back pain    Chronic fatigue 07/29/2021   Chronic fatigue syndrome    Chronic throat clearing 03/14/2023   Clotting disorder (HCC)    Constipation    Depression with anxiety 04/07/2009   Formatting of this note might be different from the original.  well controlled on current meds, follows with psychiatrist in [place]. Had a   h/o severe depression 5 years ago, and had ECT.     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  B12 deficiency   Iron deficiency     Herniated lumbar intervertebral  disc 11/12/2020   High cholesterol    History of blood clots    History of cervical spinal surgery 01/01/2018   Formatting of this note might be different from the original.  Fusion C6 and C7 per 2018 MRI report.     History of left hip replacement 01/14/2018   History of Roux-en-Y gastric bypass    Hoarseness 03/14/2023   Hyperlipidemia 08/19/2010   Hypertension    Hyponatremia 01/17/2018   Hypothyroidism    Hypothyroidism    IBS (irritable bowel syndrome) 04/06/2021   Impaired fasting glucose 08/19/2010   Incontinence 10/30/2013   Formatting of this note might be different from the original. Formatting of this note might be different from the original. Urinary Incontinence     Infertility, female    Insomnia 04/07/2009   Formatting of this note might be different from the original.  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  Followed by [facility]  Seen by Dr. Reginia Naas  03/01/15     S/p changing thoracic spinal cord stimulator generator 08/18/14.     Lumbar spine films show spinal cord generator in left flank area. 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  Follows with Rheumatology in [facility] of this note might be different from the original.  Myoadenylate deaminase deficiency, diagnosed back in 1984, after muscle   biopsies     Neutrophilic leukocytosis 08/12/2012   OAB (overactive  bladder) 08/28/2023   Obesity (BMI 30-39.9) 01/01/2018   Osteoarthritis    Osteomalacia 04/23/2019   Osteoporosis, senile 11/15/2010   Other myositis, left thigh 04/24/2023   Pain management contract agreement 11/17/2016   Formatting of this note might be different from the original.  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  Getting iron infusion via port at hematalogy/oncology center at [facility].   Was recently hospitalized in [facility] for severe anemia     Formatting of this note might be different from the original.  on OTC b12 currently     Vitamin D deficiency    Past Surgical History:  Procedure Laterality Date   ABDOMINAL HYSTERECTOMY     APPENDECTOMY     CARPAL TUNNEL RELEASE Bilateral 2023   with thumb joint replacement   CERVICAL  FUSION     CHOLECYSTECTOMY     FOOT SURGERY Bilateral    fusion   GASTRIC BYPASS     SPINAL CORD STIMULATOR INSERTION  07/2023   TOTAL HIP ARTHROPLASTY Bilateral    11/2016 & 05/2017   Patient Active Problem List   Diagnosis Date Noted   Palpitations 10/04/2023   Cardiac murmur 10/04/2023   Allergy    Anemia    Anxiety    B12 deficiency    Back pain    Chronic fatigue syndrome    Clotting disorder (HCC)    Constipation    Edema of both lower extremities    Fatty liver    Fibromyalgia  Gallbladder problem    High cholesterol    History of blood clots    History of Roux-en-Y gastric bypass    Hypothyroidism    Infertility, female    Joint pain    Kidney stones    Muscle disorder    Osteoarthritis    Prediabetes    Salzmann nodular degeneration    SOBOE (shortness of breath on exertion)    Swallowing difficulty    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   Arthritis of scaphoid-trapezium-trapezoid joint of right hand 09/10/2023   Urge incontinence 08/28/2023   OAB (overactive bladder) 08/28/2023   Lumbar post-laminectomy syndrome 07/06/2023   Snapping hip syndrome, 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   Other myositis, left thigh 04/24/2023   Sacroiliitis, not elsewhere classified (HCC) 04/24/2023   Dysphagia 04/17/2023   Elevated liver enzymes 04/02/2023   Elevated TSH 04/02/2023   Chronic throat clearing 03/14/2023   Globus sensation 03/14/2023   Hoarseness 03/14/2023   Muscle tension dysphonia 03/12/2023   Unspecified inflammatory spondylopathy, 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   Primary osteoarthritis of both knees 12/21/2021   Port-A-Cath in place 07/29/2021   Acquired hypothyroidism 07/29/2021   Chronic fatigue 07/29/2021   Vitamin B12 deficiency 07/29/2021   Vitamin D deficiency 07/29/2021   Postmenopausal estrogen deficiency 07/29/2021   IBS (irritable bowel syndrome) 04/06/2021   Retention of urine 04/06/2021   Herniated lumbar intervertebral disc 11/12/2020    Skin sensation disturbance 11/12/2020   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   Spondylopathy, unspecified 11/12/2020   Trochanteric bursitis of both hips 11/19/2019   Low serum iron 04/23/2019   Osteomalacia 04/23/2019   Abnormality of gait 04/23/2019   History of bilateral hip arthroplasty 06/26/2018   Gastroesophageal reflux disease 06/18/2018   Hypertension 06/18/2018   Intertrigo 04/30/2018   Lentigines 04/30/2018   Hyponatremia 01/17/2018   History of left hip replacement 01/14/2018   History of cervical spinal surgery 01/01/2018   Obesity (BMI 30-39.9) 01/01/2018   SK (seborrheic keratosis) 04/18/2017   Pain management contract agreement 11/17/2016   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   Allergic rhinitis due to animal hair and dander 12/02/2015   Low back pain 03/03/2015   Incontinence 10/30/2013   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   Neutrophilic leukocytosis 08/12/2012   Thrombocytosis 08/12/2012   Osteoporosis, senile 11/15/2010   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   Hyperlipidemia 08/19/2010   Impaired fasting glucose 08/19/2010   Polyarthritis 09/28/2009   Degenerative disc disease 04/07/2009   Depression with anxiety 04/07/2009   Myopathy 04/07/2009   Insomnia 04/07/2009   Migraine 04/07/2009   Scoliosis 04/07/2009   Seasonal allergies 04/07/2009   Vitamin B12 deficiency anemia 04/07/2009    PCP: Christen Butter  REFERRING PROVIDER: Verdell Carmine DIAG: bilateral trochanteric bursitis  THERAPY DIAG:  Bilateral hip pain  Muscle weakness (generalized)  Rationale for Evaluation and Treatment: Rehabilitation  ONSET DATE: 04/2023  SUBJECTIVE:   SUBJECTIVE STATEMENT: Pt states "something is helping". She states pain is not as bad when she lays on her sides and she can stay longer on her side without pain  PERTINENT HISTORY: Bilat THA 2018, fibromyalgia, OA, spinal cord stimulator, bilat TC joint fusion  Pt had bilateral hip replacements in 2018. Pt began  having Lt hip and groin pain last summer that was so severe she went to ED. She was eventually diagnosed with bursitis. Since that time the pain has continued and is "always there". She now has bilateral hip pain, groin pain. Hips are tender to touch. Pain increases with laying on  her sides, pain with sit to stand, prolonged standing and walking. Pt has not found a way to relieve pain PAIN:  Are you having pain? Yes: NPRS scale: 3/10 currently, 8-9/10 at worst Pain location: bilat hips Lt > Rt Pain description: sore, tender Aggravating factors: sitting in recliner, laying on sides, sit to stand Relieving factors: unknown  PRECAUTIONS: pt has spinal cord stimulator  RED FLAGS: None   WEIGHT BEARING RESTRICTIONS: No  FALLS:  Has patient fallen in last 6 months? No  OCCUPATION: retired  PLOF: Independent  PATIENT GOALS: reduce pain  NEXT MD VISIT: February 2025  OBJECTIVE:  Note: Objective measures were completed at Evaluation unless otherwise noted.  DIAGNOSTIC FINDINGS: x ray performed but not read at time as eval  PATIENT SURVEYS:  FOTO 39   MUSCLE LENGTH: Hamstrings: decreased bilat Hip flexors: decreased bilat   PALPATION: TTP bilat hip flexors, ITB, glutes, HS Increased mm spasticity bilat hamstrings, glutes, quads  LOWER EXTREMITY ROM:  Active ROM Right eval Left eval  Hip flexion    Hip extension    Hip abduction    Hip adduction    Hip internal rotation    Hip external rotation 30 50  Knee flexion    Knee extension    Ankle dorsiflexion    Ankle plantarflexion    Ankle inversion    Ankle eversion     (Blank rows = not tested)  LOWER EXTREMITY MMT:  MMT Right eval Left eval  Hip flexion 3+ 3  Hip extension 3 3  Hip abduction 3 3  Hip adduction    Hip internal rotation    Hip external rotation 3 3+  Knee flexion    Knee extension    Ankle dorsiflexion    Ankle plantarflexion    Ankle inversion    Ankle eversion     (Blank rows = not  tested)   FUNCTIONAL TESTS:  5 times sit to stand: 18.85 seconds   [facility] Adult PT Treatment:                                                DATE: 12/27/23 Therapeutic Exercise: Nustep L6 x 5 min for warm up and subjective intake Standing hip ext with knee flexion x 15 bilat Standing hip abd with knee flexion x 12 bilat Side step green TB - UE support on table TKE ball on wall  x 12 bilat 3 way hip on slider x 8 bilat - more difficult on Lt Treadmill walking uphill1. increasing grade each minute to 3, then downhill 1.1 mph decreasing grade each minute Seated HS stretch 2 x 30 sec bilat Seated figure 4 stretch 2 x 30 sec bilat    [facility] Adult PT Treatment:                                                DATE: 12/21/23 Therapeutic Exercise: Nustep L6 x 5 min for warm up and subjective intake Standing hip ext with knee flexion x 12 bilat Standing hip abd with knee flexion x 12 bilat Slider hip 3 way x 7 bilat Reclined sitting: clam red TB x 20, SLR 2 x 10 Seated HS stretch 2 x 30 sec bilat Seated  figure 4 stretch 2 x 30 sec bilat Resisted walking 12.5# backward/forward x 8, laterally 7.5# x 5 bilat                                                                                                                              [facility] Adult PT Treatment:                                                DATE: 12/19/23 Therapeutic Exercise: Nustep L6 x 5 min for warm up and subjective intake Hip abd red TB 2 x 10 Hip ext red TB 2 x 10 Reclined sitting: clam red TB x 20, SLR 2 x 10 Seated HS stretch 2 x 30 sec bilat Seated figure 4 stretch 2 x 30 sec bilat Resisted walking 10# backward/forward x 8, laterally 5# x 5 bilat Sit <> stand 2 x 10    PATIENT EDUCATION:  Education details: PT POC and goals, HEP Person educated: Patient Education method: Explanation, Demonstration, and Handouts Education comprehension: verbalized understanding and returned demonstration  HOME EXERCISE  PROGRAM: Access Code: [email or web address] URL: [email or web address] Date: 12/21/2023 Prepared by: Reggy Eye  Exercises - Sit to Stand  - 1 x daily - 7 x weekly - 2 sets - 10 reps - Seated Hamstring Stretch  - 1 x daily - 7 x weekly - 1 sets - 3 reps - 20-30 seconds hold - Seated Figure 4 Piriformis Stretch  - 1 x daily - 7 x weekly - 1 sets - 3 reps - 20-30 seconds hold - Standing Hip Abduction with Bent Knee  - 1 x daily - 7 x weekly - 3 sets - 10 reps - Standing Hip Extension with Leg Bent and Support  - 1 x daily - 7 x weekly - 3 sets - 10 reps - Single Leg 3 Way Reach with Support  - 1 x daily - 7 x weekly - 3 sets - 10 reps  ASSESSMENT:  CLINICAL IMPRESSION: Continued to progress hip and core strength and endurance. Pt challenged by uphill/downhill walking on treadmill, good response to side stepping with band  OBJECTIVE IMPAIRMENTS: decreased activity tolerance, decreased mobility, difficulty walking, decreased ROM, decreased strength, increased muscle spasms, impaired flexibility, and pain.     GOALS: Goals reviewed with patient? Yes  SHORT TERM GOALS: Target date: 01/09/2024   Pt will be independent with initial HEP Baseline: Goal status: INITIAL  2.  Pt will improve FOTO to > = 45 to demo improving functional mobility Baseline:  Goal status: INITIAL   LONG TERM GOALS: Target date: 02/06/2024    Pt will be independent with advanced HEP and plan for continued exercise in community Baseline:  Goal status: INITIAL  2.  Pt will improve FOTO to >= 50 to demo improved functional mobility Baseline:  Goal  status: INITIAL  3.  Pt will improve hip strength to 4+/5 bilat to improve standing and walking tolerance Baseline:  Goal status: INITIAL  4.  Pt will tolerate laying on both sides with pain <= 2/10 Baseline:  Goal status: INITIAL  5.  Pt will improve 5 x STS to <= 15 seconds Baseline:  Goal status: INITIAL    PLAN:  PT FREQUENCY:  2x/week  PT DURATION: 8 weeks  PLANNED INTERVENTIONS: [id number]- PT Re-evaluation, 97110-Therapeutic exercises, 97530- Therapeutic activity, 97112- Neuromuscular re-education, 97535- Self Care, [id number]- Manual therapy, [id number]- Aquatic Therapy, 97035- Ultrasound, [id number]- Ionotophoresis 4mg /ml Dexamethasone, Patient/Family education, Balance training, Taping, Dry Needling, Cryotherapy, and Moist heat  PLAN FOR NEXT SESSION: balance, hip stability, hip strengthening   Kaithlyn Teagle, PT 12/27/2023, 11:40 AM

## 2023-12-31 ENCOUNTER — Encounter: Payer: Self-pay | Admitting: Physical Therapy

## 2023-12-31 ENCOUNTER — Ambulatory Visit: Payer: Medicare Other | Attending: Sports Medicine | Admitting: Physical Therapy

## 2023-12-31 ENCOUNTER — Telehealth: Payer: Self-pay

## 2023-12-31 DIAGNOSIS — H8111 Benign paroxysmal vertigo, right ear: Secondary | ICD-10-CM | POA: Insufficient documentation

## 2023-12-31 DIAGNOSIS — M25552 Pain in left hip: Secondary | ICD-10-CM | POA: Insufficient documentation

## 2023-12-31 DIAGNOSIS — R42 Dizziness and giddiness: Secondary | ICD-10-CM | POA: Diagnosis present

## 2023-12-31 DIAGNOSIS — M6281 Muscle weakness (generalized): Secondary | ICD-10-CM | POA: Insufficient documentation

## 2023-12-31 DIAGNOSIS — M25551 Pain in right hip: Secondary | ICD-10-CM | POA: Diagnosis present

## 2023-12-31 NOTE — Therapy (Signed)
OUTPATIENT PHYSICAL THERAPY LOWER EXTREMITY TREATMENT   Patient Name: Heather Thompson MRN: [id number] DOB:[birth year], 73 y.o., female Today's Date: 12/31/2023  END OF SESSION:  PT End of Session - 12/31/23 1227     Visit Number 5    Number of Visits 16    Date for PT Re-Evaluation 02/06/24    Authorization Type Medicare    Authorization - Visit Number 5    Progress Note Due on Visit 10    PT Start Time 1152    PT Stop Time 1230    PT Time Calculation (min) 38 min    Activity Tolerance Patient tolerated treatment well    Behavior During Therapy WFL for tasks assessed/performed                 Past Medical History:  Diagnosis Date   Abnormality of gait 04/23/2019   Acquired hypothyroidism 07/29/2021   Allergic rhinitis due to animal hair and dander 12/02/2015   Formatting of this note might be different from the original.  [facility]  Seen by Dr. Rockwell Germany 11/11/15     Plan- Received allergy shots today. Follow up 1 year.     Allergy    Anemia    Anemia    Anxiety    Arthritis of scaphoid-trapezium-trapezoid joint of right hand 09/10/2023   B12 deficiency    Back pain    Chronic fatigue 07/29/2021   Chronic fatigue syndrome    Chronic throat clearing 03/14/2023   Clotting disorder (HCC)    Constipation    Depression with anxiety 04/07/2009   Formatting of this note might be different from the original.  well controlled on current meds, follows with psychiatrist in [place]. Had a   h/o severe depression 5 years ago, and had ECT.     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  B12 deficiency   Iron deficiency     Herniated lumbar intervertebral  disc 11/12/2020   High cholesterol    History of blood clots    History of cervical spinal surgery 01/01/2018   Formatting of this note might be different from the original.  Fusion C6 and C7 per 2018 MRI report.     History of left hip replacement 01/14/2018   History of Roux-en-Y gastric bypass    Hoarseness 03/14/2023   Hyperlipidemia 08/19/2010   Hypertension    Hyponatremia 01/17/2018   Hypothyroidism    Hypothyroidism    IBS (irritable bowel syndrome) 04/06/2021   Impaired fasting glucose 08/19/2010   Incontinence 10/30/2013   Formatting of this note might be different from the original. Formatting of this note might be different from the original. Urinary Incontinence     Infertility, female    Insomnia 04/07/2009   Formatting of this note might be different from the original.  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  Followed by [facility]  Seen by Dr.  Reginia Naas 03/01/15     S/p changing thoracic spinal cord stimulator generator 08/18/14.     Lumbar spine films show spinal cord generator in left flank area. 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  [facility] of this note might be different from the original.  Myoadenylate deaminase deficiency, diagnosed back in 1984, after muscle   biopsies     Neutrophilic leukocytosis 08/12/2012   OAB (overactive  bladder) 08/28/2023   Obesity (BMI 30-39.9) 01/01/2018   Osteoarthritis    Osteomalacia 04/23/2019   Osteoporosis, senile 11/15/2010   Other myositis, left thigh 04/24/2023   Pain management contract agreement 11/17/2016   Formatting of this note might be different from the original.  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  Getting iron infusion via port at hematalogy/oncology center at [facility].   Was recently hospitalized in [facility] for severe anemia     Formatting of this note might be different from the original.  on OTC b12 currently     Vitamin D deficiency    Past Surgical History:  Procedure Laterality Date   ABDOMINAL HYSTERECTOMY     APPENDECTOMY     CARPAL TUNNEL RELEASE Bilateral 2023   with thumb joint replacement   CERVICAL  FUSION     CHOLECYSTECTOMY     FOOT SURGERY Bilateral    fusion   GASTRIC BYPASS     SPINAL CORD STIMULATOR INSERTION  07/2023   TOTAL HIP ARTHROPLASTY Bilateral    11/2016 & 05/2017   Patient Active Problem List   Diagnosis Date Noted   Palpitations 10/04/2023   Cardiac murmur 10/04/2023   Allergy    Anemia    Anxiety    B12 deficiency    Back pain    Chronic fatigue syndrome    Clotting disorder (HCC)    Constipation    Edema of both lower extremities    Fatty liver    Fibromyalgia  Gallbladder problem    High cholesterol    History of blood clots    History of Roux-en-Y gastric bypass    Hypothyroidism    Infertility, female    Joint pain    Kidney stones    Muscle disorder    Osteoarthritis    Prediabetes    Salzmann nodular degeneration    SOBOE (shortness of breath on exertion)    Swallowing difficulty    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   Arthritis of scaphoid-trapezium-trapezoid joint of right hand 09/10/2023   Urge incontinence 08/28/2023   OAB (overactive bladder) 08/28/2023   Lumbar post-laminectomy syndrome 07/06/2023   Snapping hip syndrome, 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   Other myositis, left thigh 04/24/2023   Sacroiliitis, not elsewhere classified (HCC) 04/24/2023   Dysphagia 04/17/2023   Elevated liver enzymes 04/02/2023   Elevated TSH 04/02/2023   Chronic throat clearing 03/14/2023   Globus sensation 03/14/2023   Hoarseness 03/14/2023   Muscle tension dysphonia 03/12/2023   Unspecified inflammatory spondylopathy, 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   Primary osteoarthritis of both knees 12/21/2021   Port-A-Cath in place 07/29/2021   Acquired hypothyroidism 07/29/2021   Chronic fatigue 07/29/2021   Vitamin B12 deficiency 07/29/2021   Vitamin D deficiency 07/29/2021   Postmenopausal estrogen deficiency 07/29/2021   IBS (irritable bowel syndrome) 04/06/2021   Retention of urine 04/06/2021   Herniated lumbar intervertebral disc 11/12/2020    Skin sensation disturbance 11/12/2020   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   Spondylopathy, unspecified 11/12/2020   Trochanteric bursitis of both hips 11/19/2019   Low serum iron 04/23/2019   Osteomalacia 04/23/2019   Abnormality of gait 04/23/2019   History of bilateral hip arthroplasty 06/26/2018   Gastroesophageal reflux disease 06/18/2018   Hypertension 06/18/2018   Intertrigo 04/30/2018   Lentigines 04/30/2018   Hyponatremia 01/17/2018   History of left hip replacement 01/14/2018   History of cervical spinal surgery 01/01/2018   Obesity (BMI 30-39.9) 01/01/2018   SK (seborrheic keratosis) 04/18/2017   Pain management contract agreement 11/17/2016   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   Allergic rhinitis due to animal hair and dander 12/02/2015   Low back pain 03/03/2015   Incontinence 10/30/2013   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   Neutrophilic leukocytosis 08/12/2012   Thrombocytosis 08/12/2012   Osteoporosis, senile 11/15/2010   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   Hyperlipidemia 08/19/2010   Impaired fasting glucose 08/19/2010   Polyarthritis 09/28/2009   Degenerative disc disease 04/07/2009   Depression with anxiety 04/07/2009   Myopathy 04/07/2009   Insomnia 04/07/2009   Migraine 04/07/2009   Scoliosis 04/07/2009   Seasonal allergies 04/07/2009   Vitamin B12 deficiency anemia 04/07/2009    PCP: Christen Butter  REFERRING PROVIDER: Verdell Carmine DIAG: bilateral trochanteric bursitis  THERAPY DIAG:  Bilateral hip pain  Muscle weakness (generalized)  Rationale for Evaluation and Treatment: Rehabilitation  ONSET DATE: 04/2023  SUBJECTIVE:   SUBJECTIVE STATEMENT: Pt states her neck is bothering her. She has to have her stimulator removed. Her hips are feeling "ok" but "not great"  PERTINENT HISTORY: Bilat THA 2018, fibromyalgia, OA, spinal cord stimulator, bilat TC joint fusion  Pt had bilateral hip replacements in 2018. Pt began having Lt hip and  groin pain last summer that was so severe she went to ED. She was eventually diagnosed with bursitis. Since that time the pain has continued and is "always there". She now has bilateral hip pain, groin pain. Hips are tender to touch. Pain increases with laying on her sides, pain with sit to  stand, prolonged standing and walking. Pt has not found a way to relieve pain PAIN:  Are you having pain? Yes: NPRS scale: 4/10 currently, 8-9/10 at worst Pain location: bilat hips Lt > Rt Pain description: sore, tender Aggravating factors: sitting in recliner, laying on sides, sit to stand Relieving factors: unknown  PRECAUTIONS: pt has spinal cord stimulator  RED FLAGS: None   WEIGHT BEARING RESTRICTIONS: No  FALLS:  Has patient fallen in last 6 months? No  OCCUPATION: retired  PLOF: Independent  PATIENT GOALS: reduce pain  NEXT MD VISIT: February 2025  OBJECTIVE:  Note: Objective measures were completed at Evaluation unless otherwise noted.  DIAGNOSTIC FINDINGS: x ray performed but not read at time as eval  PATIENT SURVEYS:  FOTO 39   MUSCLE LENGTH: Hamstrings: decreased bilat Hip flexors: decreased bilat   PALPATION: TTP bilat hip flexors, ITB, glutes, HS Increased mm spasticity bilat hamstrings, glutes, quads  LOWER EXTREMITY ROM:  Active ROM Right eval Left eval  Hip flexion    Hip extension    Hip abduction    Hip adduction    Hip internal rotation    Hip external rotation 30 50  Knee flexion    Knee extension    Ankle dorsiflexion    Ankle plantarflexion    Ankle inversion    Ankle eversion     (Blank rows = not tested)  LOWER EXTREMITY MMT:  MMT Right eval Left eval  Hip flexion 3+ 3  Hip extension 3 3  Hip abduction 3 3  Hip adduction    Hip internal rotation    Hip external rotation 3 3+  Knee flexion    Knee extension    Ankle dorsiflexion    Ankle plantarflexion    Ankle inversion    Ankle eversion     (Blank rows = not  tested)   FUNCTIONAL TESTS:  5 times sit to stand: 18.85 seconds   [facility] Adult PT Treatment:                                                DATE: 12/31/23 Therapeutic Exercise: Side step red TB Standing hip ext with knee flexion x 15 bilat Standing hip abd with knee flexion x 12 bilat Rocker board lateral taps x 1 min Rocker board lateral static hold x 1 min Tandem stance on foam 2 x 30 sec Slider hip 3 way x 8 bilat Seated hip IR with ball between knees 2 x 10 Seated hip ER with red TB 2 x 10 Seated HS stretch 2 x 30 sec bilat Seated figure 4 stretch 2 x 30 sec bilat   [facility] Adult PT Treatment:                                                DATE: 12/27/23 Therapeutic Exercise: Nustep L6 x 5 min for warm up and subjective intake Standing hip ext with knee flexion x 15 bilat Standing hip abd with knee flexion x 12 bilat Side step green TB - UE support on table TKE ball on wall  x 12 bilat 3 way hip on slider x 8 bilat - more difficult on Lt Treadmill walking uphill1. increasing grade each minute to 3,  then downhill 1.1 mph decreasing grade each minute Seated HS stretch 2 x 30 sec bilat Seated figure 4 stretch 2 x 30 sec bilat    [facility] Adult PT Treatment:                                                DATE: 12/21/23 Therapeutic Exercise: Nustep L6 x 5 min for warm up and subjective intake Standing hip ext with knee flexion x 12 bilat Standing hip abd with knee flexion x 12 bilat Slider hip 3 way x 7 bilat Reclined sitting: clam red TB x 20, SLR 2 x 10 Seated HS stretch 2 x 30 sec bilat Seated figure 4 stretch 2 x 30 sec bilat Resisted walking 12.5# backward/forward x 8, laterally 7.5# x 5 bilat                                                                                                                               PATIENT EDUCATION:  Education details: PT POC and goals, HEP Person educated: Patient Education method: Explanation, Demonstration, and Handouts Education  comprehension: verbalized understanding and returned demonstration  HOME EXERCISE PROGRAM: Access Code: [email or web address] URL: [email or web address] Date: 12/31/2023 Prepared by: Reggy Eye  Exercises - Sit to Stand  - 1 x daily - 7 x weekly - 2 sets - 10 reps - Seated Hamstring Stretch  - 1 x daily - 7 x weekly - 1 sets - 3 reps - 20-30 seconds hold - Seated Figure 4 Piriformis Stretch  - 1 x daily - 7 x weekly - 1 sets - 3 reps - 20-30 seconds hold - Standing Hip Abduction with Bent Knee  - 1 x daily - 7 x weekly - 3 sets - 10 reps - Standing Hip Extension with Leg Bent and Support  - 1 x daily - 7 x weekly - 3 sets - 10 reps - Single Leg 3 Way Reach with Support  - 1 x daily - 7 x weekly - 3 sets - 10 reps - Seated Hip Internal Rotation with Ball and Resistance  - 1 x daily - 7 x weekly - 3 sets - 10 reps  ASSESSMENT:  CLINICAL IMPRESSION: Incorporated balance today to promote hip stability and endurance. Pt challenged by foam and rocker board activities. Held treadmill or resisted walking today due to neck pain.  OBJECTIVE IMPAIRMENTS: decreased activity tolerance, decreased mobility, difficulty walking, decreased ROM, decreased strength, increased muscle spasms, impaired flexibility, and pain.     GOALS: Goals reviewed with patient? Yes  SHORT TERM GOALS: Target date: 01/09/2024   Pt will be independent with initial HEP Baseline: Goal status: INITIAL  2.  Pt will improve FOTO to > = 45 to demo improving functional mobility Baseline:  Goal status: INITIAL   LONG TERM GOALS: Target date: 02/06/2024  Pt will be independent with advanced HEP and plan for continued exercise in community Baseline:  Goal status: INITIAL  2.  Pt will improve FOTO to >= 50 to demo improved functional mobility Baseline:  Goal status: INITIAL  3.  Pt will improve hip strength to 4+/5 bilat to improve standing and walking tolerance Baseline:  Goal status: INITIAL  4.  Pt  will tolerate laying on both sides with pain <= 2/10 Baseline:  Goal status: INITIAL  5.  Pt will improve 5 x STS to <= 15 seconds Baseline:  Goal status: INITIAL    PLAN:  PT FREQUENCY: 2x/week  PT DURATION: 8 weeks  PLANNED INTERVENTIONS: 97164- PT Re-evaluation, 97110-Therapeutic exercises, 97530- Therapeutic activity, 97112- Neuromuscular re-education, 97535- Self Care, [id number]- Manual therapy, [id number]- Aquatic Therapy, 97035- Ultrasound, [id number]- Ionotophoresis 4mg /ml Dexamethasone, Patient/Family education, Balance training, Taping, Dry Needling, Cryotherapy, and Moist heat  PLAN FOR NEXT SESSION: balance, hip stability, hip strengthening   Ahmaad Neidhardt, PT 12/31/2023, 12:29 PM

## 2023-12-31 NOTE — Telephone Encounter (Signed)
Patient called, she just called [facility] to get the 2 pens that you prescribed last month. They are supposed to be $85 with Good Rx card and cash pay. Pharmacist told her the price for 2 pens now cash pay is over $500  [facility]

## 2024-01-01 ENCOUNTER — Telehealth (INDEPENDENT_AMBULATORY_CARE_PROVIDER_SITE_OTHER): Payer: Medicare Other | Admitting: Psychology

## 2024-01-01 ENCOUNTER — Other Ambulatory Visit: Payer: Self-pay | Admitting: Nurse Practitioner

## 2024-01-01 ENCOUNTER — Other Ambulatory Visit: Payer: Self-pay | Admitting: Medical-Surgical

## 2024-01-01 DIAGNOSIS — F4323 Adjustment disorder with mixed anxiety and depressed mood: Secondary | ICD-10-CM | POA: Diagnosis not present

## 2024-01-01 DIAGNOSIS — F5089 Other specified eating disorder: Secondary | ICD-10-CM | POA: Diagnosis not present

## 2024-01-01 DIAGNOSIS — E669 Obesity, unspecified: Secondary | ICD-10-CM

## 2024-01-01 DIAGNOSIS — R632 Polyphagia: Secondary | ICD-10-CM

## 2024-01-01 MED ORDER — LIRAGLUTIDE 18 MG/3ML ~~LOC~~ SOPN: PEN_INJECTOR | SUBCUTANEOUS | 0 refills | Status: DC

## 2024-01-01 NOTE — Progress Notes (Signed)
  Office: [phone]  /  Fax: [phone]    Date: January 01, 2024  Appointment Start Time: 9:26am Duration: 34 minutes Provider: Wyatt Fire, Psy.D. Type of Session: Individual Therapy  Location of Patient: Home (private location) Location of Provider: Provider's Home (private office) Type of Contact: Telepsychological Visit via MyChart Video Visit  Session Content: Heather Thompson is a 73 y.o. female presenting for a follow-up appointment to address the previously established treatment goal of increasing coping skills.Today's appointment was a telepsychological visit. Heather Thompson provided verbal consent for today's telepsychological appointment and she is aware she is responsible for securing confidentiality on her end of the session. Prior to proceeding with today's appointment, Heather Thompson's physical location at the time of this appointment was obtained as well a phone number she could be reached at in the event of technical difficulties. Heather Thompson and this provider participated in today's telepsychological service.   This provider conducted a brief check-in. Heather Thompson stated she is feeling discouraged and described feeling completely out of control as it relates to her eating habits. She shared about her recent visit with Heather Scala, Heather, including a plan to restart Victoza . She explained she was unable to restart the medication due to cost. She was encouraged to discuss further with Heather Thompson. Reviewed current eating habits. Psychoeducation regarding triggers for emotional eating was provided. Heather Thompson was provided a handout, and encouraged to utilize the handout between now and the next appointment to increase awareness of triggers and frequency. Heather Thompson agreed. This provider also discussed behavioral strategies for specific triggers. Heather Thompson provided verbal consent during today's appointment for this provider to send a handout about triggers via e-mail. Discussed the importance of protein intake again.  Heather Thompson agreed to eat smaller/frequent meals congruent to her prescribed structured meal plan and also incorporate pleasurable activities daily (e.g., diamond painting and reading). Overall, Heather Thompson was receptive to today's appointment as evidenced by openness to sharing, responsiveness to feedback, and willingness to explore triggers for emotional eating.  Mental Status Examination:  Appearance: neat Behavior: appropriate to circumstances Mood: sad Affect: mood congruent; tearful at times Speech: WNL Eye Contact: appropriate Psychomotor Activity: WNL Gait: unable to assess Thought Process: linear, logical, and goal directed and no evidence or endorsement of suicidal, homicidal, and self-harm ideation, plan and intent  Thought Content/Perception: no hallucinations, delusions, bizarre thinking or behavior endorsed or observed Orientation: AAOx4 Memory/Concentration: intact Insight: fair Judgment: fair  Interventions:  Conducted a brief chart review Provided empathic reflections and validation Reviewed content from the previous session Provided positive reinforcement Employed supportive psychotherapy interventions to facilitate reduced distress and to improve coping skills with identified stressors Engaged patient in problem solving Psychoeducation provided regarding triggers for emotional eating behaviors  DSM-5 Diagnosis(es):  F50.89 Other Specified Feeding or Eating Disorder, Emotional Eating Behaviors and  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 Heather Thompson has demonstrated some progress in her goal as evidenced by her sharing implementation of discussed strategies.   Plan: The next appointment is scheduled for 01/14/2024 at 2pm, which will be via MyChart Video Visit. The next session will focus on working towards the  established treatment goal.   Wyatt Fire, PsyD

## 2024-01-01 NOTE — Telephone Encounter (Signed)
LMVM for patient to return call. 

## 2024-01-01 NOTE — Telephone Encounter (Signed)
Patient called, she said can you send the prescription in to [facility] on [address], [place], [place] [address]. Thanks!

## 2024-01-02 ENCOUNTER — Encounter: Payer: Self-pay | Admitting: Physical Therapy

## 2024-01-02 ENCOUNTER — Ambulatory Visit: Payer: Medicare Other | Admitting: Physical Therapy

## 2024-01-02 DIAGNOSIS — M25551 Pain in right hip: Secondary | ICD-10-CM | POA: Diagnosis not present

## 2024-01-02 DIAGNOSIS — M6281 Muscle weakness (generalized): Secondary | ICD-10-CM

## 2024-01-02 NOTE — Therapy (Signed)
 OUTPATIENT PHYSICAL THERAPY LOWER EXTREMITY TREATMENT   Patient Name: Heather Thompson MRN: [id number] DOB:1950-12-21, 73 y.o., female Today's Date: 01/02/2024  END OF SESSION:  PT End of Session - 01/02/24 1225     Visit Number 6    Number of Visits 16    Date for PT Re-Evaluation 02/06/24    Authorization Type Medicare    Authorization - Visit Number 6    Progress Note Due on Visit 10    PT Start Time 1145    PT Stop Time 1226    PT Time Calculation (min) 41 min    Activity Tolerance Patient tolerated treatment well    Behavior During Therapy WFL for tasks assessed/performed                  Past Medical History:  Diagnosis Date   Abnormality of gait 04/23/2019   Acquired hypothyroidism 07/29/2021   Allergic rhinitis due to animal hair and dander 12/02/2015   Formatting of this note might be different from the original.  [facility]  Seen by Dr. Leita 11/11/15     Plan- Received allergy shots today. Follow up 1 year.     Allergy    Anemia    Anemia    Anxiety    Arthritis of scaphoid-trapezium-trapezoid joint of right hand 09/10/2023   B12 deficiency    Back pain    Chronic fatigue 07/29/2021   Chronic fatigue syndrome    Chronic throat clearing 03/14/2023   Clotting disorder (HCC)    Constipation    Depression with anxiety 04/07/2009   Formatting of this note might be different from the original.  well controlled on current meds, follows with psychiatrist in [place]. Had a   h/o severe depression 5 years ago, and had ECT.     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  B12 deficiency   Iron deficiency     Herniated lumbar  intervertebral disc 11/12/2020   High cholesterol    History of blood clots    History of cervical spinal surgery 01/01/2018   Formatting of this note might be different from the original.  Fusion C6 and C7 per 2018 MRI report.     History of left hip replacement 01/14/2018   History of Roux-en-Y gastric bypass    Hoarseness 03/14/2023   Hyperlipidemia 08/19/2010   Hypertension    Hyponatremia 01/17/2018   Hypothyroidism    Hypothyroidism    IBS (irritable bowel syndrome) 04/06/2021   Impaired fasting glucose 08/19/2010   Incontinence 10/30/2013   Formatting of this note might be different from the original. Formatting of this note might be different from the original. Urinary Incontinence     Infertility, female    Insomnia 04/07/2009   Formatting of this note might be different from the original.  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  Followed by [facility]  Seen by  Dr. Linnette 03/01/15     S/p changing thoracic spinal cord stimulator generator 08/18/14.     Lumbar spine films show spinal cord generator in left flank area. 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  Follows with Rheumatology in [facility] of this note might be different from the original.  Myoadenylate deaminase deficiency, diagnosed back in 1984, after muscle   biopsies     Neutrophilic leukocytosis 08/12/2012   OAB  (overactive bladder) 08/28/2023   Obesity (BMI 30-39.9) 01/01/2018   Osteoarthritis    Osteomalacia 04/23/2019   Osteoporosis, senile 11/15/2010   Other myositis, left thigh 04/24/2023   Pain management contract agreement 11/17/2016   Formatting of this note might be different from the original.  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  Getting iron infusion via port at hematalogy/oncology center at [facility].   Was recently hospitalized in [facility] for severe anemia     Formatting of this note might be different from the original.  on OTC b12 currently     Vitamin D  deficiency    Past Surgical History:  Procedure Laterality Date   ABDOMINAL HYSTERECTOMY     APPENDECTOMY     CARPAL TUNNEL RELEASE Bilateral 2023   with thumb joint replacement    CERVICAL FUSION     CHOLECYSTECTOMY     FOOT SURGERY Bilateral    fusion   GASTRIC BYPASS     SPINAL CORD STIMULATOR INSERTION  07/2023   TOTAL HIP ARTHROPLASTY Bilateral    11/2016 & 05/2017   Patient Active Problem List   Diagnosis Date Noted   Palpitations 10/04/2023   Cardiac murmur 10/04/2023   Allergy    Anemia    Anxiety    B12 deficiency    Back pain    Chronic fatigue syndrome    Clotting disorder (HCC)    Constipation    Edema of both lower extremities    Fatty liver    Fibromyalgia  Gallbladder problem    High cholesterol    History of blood clots    History of Roux-en-Y gastric bypass    Hypothyroidism    Infertility, female    Joint pain    Kidney stones    Muscle disorder    Osteoarthritis    Prediabetes    Salzmann nodular degeneration    SOBOE (shortness of breath on exertion)    Swallowing difficulty    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   Arthritis of scaphoid-trapezium-trapezoid joint of right hand 09/10/2023   Urge incontinence 08/28/2023   OAB (overactive bladder) 08/28/2023   Lumbar post-laminectomy syndrome 07/06/2023   Snapping hip syndrome, 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   Other myositis, left thigh 04/24/2023   Sacroiliitis, not elsewhere classified (HCC) 04/24/2023   Dysphagia 04/17/2023   Elevated liver enzymes 04/02/2023   Elevated TSH 04/02/2023   Chronic throat clearing 03/14/2023   Globus sensation 03/14/2023   Hoarseness 03/14/2023   Muscle tension dysphonia 03/12/2023   Unspecified inflammatory spondylopathy, 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   Primary osteoarthritis of both knees 12/21/2021   Port-A-Cath in place 07/29/2021   Acquired hypothyroidism 07/29/2021   Chronic fatigue 07/29/2021   Vitamin B12 deficiency 07/29/2021   Vitamin D  deficiency 07/29/2021   Postmenopausal estrogen deficiency 07/29/2021   IBS (irritable bowel syndrome) 04/06/2021   Retention of urine 04/06/2021   Herniated lumbar intervertebral disc  11/12/2020   Skin sensation disturbance 11/12/2020   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   Spondylopathy, unspecified 11/12/2020   Trochanteric bursitis of both hips 11/19/2019   Low serum iron 04/23/2019   Osteomalacia 04/23/2019   Abnormality of gait 04/23/2019   History of bilateral hip arthroplasty 06/26/2018   Gastroesophageal reflux disease 06/18/2018   Hypertension 06/18/2018   Intertrigo 04/30/2018   Lentigines 04/30/2018   Hyponatremia 01/17/2018   History of left hip replacement 01/14/2018   History of cervical spinal surgery 01/01/2018   Obesity (BMI 30-39.9) 01/01/2018   SK (seborrheic keratosis) 04/18/2017   Pain management contract agreement 11/17/2016   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   Allergic rhinitis due to animal hair and dander 12/02/2015   Low back pain 03/03/2015   Incontinence 10/30/2013   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   Neutrophilic leukocytosis 08/12/2012   Thrombocytosis 08/12/2012   Osteoporosis, senile 11/15/2010   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   Hyperlipidemia 08/19/2010   Impaired fasting glucose 08/19/2010   Polyarthritis 09/28/2009   Degenerative disc disease 04/07/2009   Depression with anxiety 04/07/2009   Myopathy 04/07/2009   Insomnia 04/07/2009   Migraine 04/07/2009   Scoliosis 04/07/2009   Seasonal allergies 04/07/2009   Vitamin B12 deficiency anemia 04/07/2009    PCP: Willo Mini  REFERRING PROVIDER: Curtis MART DIAG: bilateral trochanteric bursitis  THERAPY DIAG:  Bilateral hip pain  Muscle weakness (generalized)  Rationale for Evaluation and Treatment: Rehabilitation  ONSET DATE: 04/2023  SUBJECTIVE:   SUBJECTIVE STATEMENT: Pt has some increased pain in Rt hip today. Neck is still bothering her  PERTINENT HISTORY: Bilat THA 2018, fibromyalgia, OA, spinal cord stimulator, bilat TC joint fusion  Pt had bilateral hip replacements in 2018. Pt began having Lt hip and groin pain last summer that was so  severe she went to ED. She was eventually diagnosed with bursitis. Since that time the pain has continued and is always there. She now has bilateral hip pain, groin pain. Hips are tender to touch. Pain increases with laying on her sides, pain with sit to stand, prolonged standing and walking. Pt has not  found a way to relieve pain PAIN:  Are you having pain? Yes: NPRS scale: 5/10 currently, 8-9/10 at worst Pain location: bilat hips Lt > Rt Pain description: sore, tender Aggravating factors: sitting in recliner, laying on sides, sit to stand Relieving factors: unknown  PRECAUTIONS: pt has spinal cord stimulator  RED FLAGS: None   WEIGHT BEARING RESTRICTIONS: No  FALLS:  Has patient fallen in last 6 months? No  OCCUPATION: retired  PLOF: Independent  PATIENT GOALS: reduce pain  NEXT MD VISIT: February 2025  OBJECTIVE:  Note: Objective measures were completed at Evaluation unless otherwise noted.  DIAGNOSTIC FINDINGS: x ray performed but not read at time as eval  PATIENT SURVEYS:  FOTO 39   MUSCLE LENGTH: Hamstrings: decreased bilat Hip flexors: decreased bilat   PALPATION: TTP bilat hip flexors, ITB, glutes, HS Increased mm spasticity bilat hamstrings, glutes, quads  LOWER EXTREMITY ROM:  Active ROM Right eval Left eval  Hip flexion    Hip extension    Hip abduction    Hip adduction    Hip internal rotation    Hip external rotation 30 50  Knee flexion    Knee extension    Ankle dorsiflexion    Ankle plantarflexion    Ankle inversion    Ankle eversion     (Blank rows = not tested)  LOWER EXTREMITY MMT:  MMT Right eval Left eval  Hip flexion 3+ 3  Hip extension 3 3  Hip abduction 3 3  Hip adduction    Hip internal rotation    Hip external rotation 3 3+  Knee flexion    Knee extension    Ankle dorsiflexion    Ankle plantarflexion    Ankle inversion    Ankle eversion     (Blank rows = not tested)   FUNCTIONAL TESTS:  5 times sit to  stand: 18.85 seconds   [facility] Adult PT Treatment:                                                DATE: 01/02/24 Therapeutic Exercise/Therapeutic Activity: Side step red TB Standing hip ext with knee flexion squeezing pillow x 10 bilat Rocker board lateral taps x 1 min Rocker board lateral static hold x 1 min Seated hip IR with ball between knees 2 x 10 SLR wit hip abd/add over 3# dumbell x 10 bilat Side plank on wall 3 x 10 sec bilat Green TB pull down (shld ext) with slow march Tandem stance with green TB row   [facility] Adult PT Treatment:                                                DATE: 12/31/23 Therapeutic Exercise: Side step red TB Standing hip ext with knee flexion x 15 bilat Standing hip abd with knee flexion x 12 bilat Rocker board lateral taps x 1 min Rocker board lateral static hold x 1 min Tandem stance on foam 2 x 30 sec Slider hip 3 way x 8 bilat Seated hip IR with ball between knees 2 x 10 Seated hip ER with red TB 2 x 10 Seated HS stretch 2 x 30 sec bilat Seated figure 4 stretch 2 x 30 sec bilat  [facility] Adult PT Treatment:                                                DATE: 12/27/23 Therapeutic Exercise: Nustep L6 x 5 min for warm up and subjective intake Standing hip ext with knee flexion x 15 bilat Standing hip abd with knee flexion x 12 bilat Side step green TB - UE support on table TKE ball on wall  x 12 bilat 3 way hip on slider x 8 bilat - more difficult on Lt Treadmill walking uphill1. increasing grade each minute to 3, then downhill 1.1 mph decreasing grade each minute Seated HS stretch 2 x 30 sec bilat Seated figure 4 stretch 2 x 30 sec bilat    [facility] Adult PT Treatment:                                                DATE: 12/21/23 Therapeutic Exercise: Nustep L6 x 5 min for warm up and subjective intake Standing hip ext with knee flexion x 12 bilat Standing hip abd with knee flexion x 12 bilat Slider hip 3 way x 7 bilat Reclined sitting: clam red  TB x 20, SLR 2 x 10 Seated HS stretch 2 x 30 sec bilat Seated figure 4 stretch 2 x 30 sec bilat Resisted walking 12.5# backward/forward x 8, laterally 7.5# x 5 bilat                                                                                                                               PATIENT EDUCATION:  Education details: PT POC and goals, HEP Person educated: Patient Education method: Explanation, Demonstration, and Handouts Education comprehension: verbalized understanding and returned demonstration  HOME EXERCISE PROGRAM: Access Code: [email or web address] URL: [email or web address] Date: 12/31/2023 Prepared by: Darice Conine  Exercises - Sit to Stand  - 1 x daily - 7 x weekly - 2 sets - 10 reps - Seated Hamstring Stretch  - 1 x daily - 7 x weekly - 1 sets - 3 reps - 20-30 seconds hold - Seated Figure 4 Piriformis Stretch  - 1 x daily - 7 x weekly - 1 sets - 3 reps - 20-30 seconds hold - Standing Hip Abduction with Bent Knee  - 1 x daily - 7 x weekly - 3 sets - 10 reps - Standing Hip Extension with Leg Bent and Support  - 1 x daily - 7 x weekly - 3 sets - 10 reps - Single Leg 3 Way Reach with Support  - 1 x daily - 7 x weekly - 3 sets - 10 reps - Seated Hip Internal Rotation with Ball and Resistance  - 1 x  daily - 7 x weekly - 3 sets - 10 reps  ASSESSMENT:  CLINICAL IMPRESSION: Incorporated more core strengthening and balance today due to increased hip pain. Pt with good tolerance to all activities during session. She is challenged by progression of balance activities  OBJECTIVE IMPAIRMENTS: decreased activity tolerance, decreased mobility, difficulty walking, decreased ROM, decreased strength, increased muscle spasms, impaired flexibility, and pain.     GOALS: Goals reviewed with patient? Yes  SHORT TERM GOALS: Target date: 01/09/2024   Pt will be independent with initial HEP Baseline: Goal status: INITIAL  2.  Pt will improve FOTO to > = 45 to demo  improving functional mobility Baseline:  Goal status: INITIAL   LONG TERM GOALS: Target date: 02/06/2024    Pt will be independent with advanced HEP and plan for continued exercise in community Baseline:  Goal status: INITIAL  2.  Pt will improve FOTO to >= 50 to demo improved functional mobility Baseline:  Goal status: INITIAL  3.  Pt will improve hip strength to 4+/5 bilat to improve standing and walking tolerance Baseline:  Goal status: INITIAL  4.  Pt will tolerate laying on both sides with pain <= 2/10 Baseline:  Goal status: INITIAL  5.  Pt will improve 5 x STS to <= 15 seconds Baseline:  Goal status: INITIAL    PLAN:  PT FREQUENCY: 2x/week  PT DURATION: 8 weeks  PLANNED INTERVENTIONS: 97164- PT Re-evaluation, 97110-Therapeutic exercises, 97530- Therapeutic activity, [id number]- Neuromuscular re-education, 97535- Self Care, [id number]- Manual therapy, [id number]- Aquatic Therapy, 97035- Ultrasound, [id number]- Ionotophoresis 4mg /ml Dexamethasone , Patient/Family education, Balance training, Taping, Dry Needling, Cryotherapy, and Moist heat  PLAN FOR NEXT SESSION: CHECK STGs, UPDATE HEP balance, hip stability, hip strengthening   Calleen Alvis, PT 01/02/2024, 12:26 PM

## 2024-01-07 ENCOUNTER — Encounter: Payer: Self-pay | Admitting: Family

## 2024-01-07 ENCOUNTER — Inpatient Hospital Stay: Payer: Medicare Other | Attending: Hematology & Oncology

## 2024-01-07 ENCOUNTER — Inpatient Hospital Stay (HOSPITAL_BASED_OUTPATIENT_CLINIC_OR_DEPARTMENT_OTHER): Payer: Medicare Other | Admitting: Family

## 2024-01-07 ENCOUNTER — Inpatient Hospital Stay: Payer: Medicare Other

## 2024-01-07 VITALS — BP 150/46 | HR 68 | Temp 98.6°F | Resp 20 | Ht 64.0 in | Wt 191.6 lb

## 2024-01-07 DIAGNOSIS — D509 Iron deficiency anemia, unspecified: Secondary | ICD-10-CM

## 2024-01-07 DIAGNOSIS — E611 Iron deficiency: Secondary | ICD-10-CM | POA: Diagnosis present

## 2024-01-07 DIAGNOSIS — Z9884 Bariatric surgery status: Secondary | ICD-10-CM | POA: Diagnosis not present

## 2024-01-07 DIAGNOSIS — Z95828 Presence of other vascular implants and grafts: Secondary | ICD-10-CM | POA: Diagnosis not present

## 2024-01-07 DIAGNOSIS — K912 Postsurgical malabsorption, not elsewhere classified: Secondary | ICD-10-CM | POA: Diagnosis present

## 2024-01-07 LAB — RETICULOCYTES
Immature Retic Fract: 10.2 % (ref 2.3–15.9)
RBC.: 3.82 MIL/uL — ABNORMAL LOW (ref 3.87–5.11)
Retic Count, Absolute: 58.8 10*3/uL (ref 19.0–186.0)
Retic Ct Pct: 1.5 % (ref 0.4–3.1)

## 2024-01-07 LAB — CBC WITH DIFFERENTIAL (CANCER CENTER ONLY)
Abs Immature Granulocytes: 0.04 10*3/uL (ref 0.00–0.07)
Basophils Absolute: 0 10*3/uL (ref 0.0–0.1)
Basophils Relative: 1 %
Eosinophils Absolute: 0.3 10*3/uL (ref 0.0–0.5)
Eosinophils Relative: 5 %
HCT: 35.9 % — ABNORMAL LOW (ref 36.0–46.0)
Hemoglobin: 11.5 g/dL — ABNORMAL LOW (ref 12.0–15.0)
Immature Granulocytes: 1 %
Lymphocytes Relative: 28 %
Lymphs Abs: 1.6 10*3/uL (ref 0.7–4.0)
MCH: 29.9 pg (ref 26.0–34.0)
MCHC: 32 g/dL (ref 30.0–36.0)
MCV: 93.2 fL (ref 80.0–100.0)
Monocytes Absolute: 0.6 10*3/uL (ref 0.1–1.0)
Monocytes Relative: 10 %
Neutro Abs: 3.1 10*3/uL (ref 1.7–7.7)
Neutrophils Relative %: 55 %
Platelet Count: 242 10*3/uL (ref 150–400)
RBC: 3.85 MIL/uL — ABNORMAL LOW (ref 3.87–5.11)
RDW: 13.2 % (ref 11.5–15.5)
WBC Count: 5.6 10*3/uL (ref 4.0–10.5)
nRBC: 0 % (ref 0.0–0.2)

## 2024-01-07 LAB — FERRITIN: Ferritin: 93 ng/mL (ref 11–307)

## 2024-01-07 LAB — IRON AND IRON BINDING CAPACITY (CC-WL,HP ONLY)
Iron: 58 ug/dL (ref 28–170)
Saturation Ratios: 14 % (ref 10.4–31.8)
TIBC: 417 ug/dL (ref 250–450)
UIBC: 359 ug/dL (ref 148–442)

## 2024-01-07 MED ORDER — SODIUM CHLORIDE 0.9% FLUSH
10.0000 mL | INTRAVENOUS | Status: DC | PRN
  Administered 2024-01-07: 10 mL

## 2024-01-07 MED ORDER — HEPARIN SOD (PORK) LOCK FLUSH 100 UNIT/ML IV SOLN
500.0000 [IU] | Freq: Once | INTRAVENOUS | Status: AC | PRN
  Administered 2024-01-07: 500 [IU]

## 2024-01-07 NOTE — Patient Instructions (Signed)

## 2024-01-07 NOTE — Progress Notes (Signed)
 Hematology and Oncology Follow Up Visit  Shaquoia Cohan [id number] 1950-12-12 73 y.o. 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 Pantaleo is here today for follow-up. She is doing fairly well. She just returned from visiting family in [place]  last night.  She notes fatigue.  No blood loss, abnormal bruising or petechiae noted.  No fever, chills, n/v, cough, rash, dizziness, SOB, chest pain, abdominal pain or changes in bowel or bladder habits.  No swelling in her extremities at this time.  Numbness and tingling in her lower extremities since her hip replacements in 2018 is unchanged from baseline.  No falls or syncope reported.  Appetite and hydration is good. Weight is stable at 191 lbs.   ECOG Performance Status: 1 - Symptomatic but completely ambulatory  Medications:  Allergies as of 01/07/2024       Reactions   Compazine [prochlorperazine] Anaphylaxis   Phenothiazines Anaphylaxis, Swelling   Caused her throat to close   Prednisone Itching, Swelling, Rash   Facial swelling   Pregabalin Itching, Swelling   Swelling and itching of hands and feet.         Medication List        Accurate as of January 07, 2024  1:50 PM. If you have any questions, ask your nurse or doctor.          buPROPion  300 MG 24 hr tablet Commonly known as: WELLBUTRIN  XL Take 1 tablet (300 mg total) by mouth daily.   celecoxib  200 MG capsule Commonly known as: CeleBREX  One to 2 tablets by mouth daily as needed for pain.   desvenlafaxine  100 MG 24 hr tablet Commonly known as: PRISTIQ  TAKE ONE TABLET BY MOUTH EVERY DAY   FLUoxetine  10 MG capsule Commonly known as: PROZAC  TAKE ONE CAPSULE BY MOUTH EVERY DAY   gabapentin  300 MG capsule Commonly known as: NEURONTIN  One tab PO qHS for a week, then BID for a week, then TID. May double weekly to a max of  3,600mg /day   hydrOXYzine  10 MG tablet Commonly known as: ATARAX  Take 1 tablet (10 mg total) by mouth at bedtime.   Insulin  Pen Needle 31G X 5 MM Misc Use as directed with Victoza    liraglutide  18 MG/3ML Sopn Commonly known as: VICTOZA  Take 0.6mg  SQ daily for one week and then can increase to 1.2mg  SQ daily   lisinopril  10 MG tablet Commonly known as: ZESTRIL  Take 1 tablet (10 mg total) by mouth daily.   Magnesium 250 MG Tabs Take 1 tablet by mouth daily.   Multi-Vitamin tablet Take 1 tablet by mouth daily.   nystatin  powder Commonly known as: MYCOSTATIN /NYSTOP  APPLY TO THE AFFECTED AREA(S) EXTERNALLY TWICE DAILY   propranolol  20 MG tablet Commonly known as: INDERAL  TAKE 1 TABLET(20 MG) BY MOUTH DAILY AS NEEDED   solifenacin  5 MG tablet Commonly known as: VESICARE  Take 1 tablet (5 mg total) by mouth daily.   temazepam  22.5 MG capsule Commonly known as: RESTORIL  TAKE ONE CAPSULE BY MOUTH AT NIGHT   tiZANidine  2 MG tablet Commonly known as: ZANAFLEX  TAKE 1 TABLET (2 MG TOTAL) BY MOUTH IN THE MORNING AND AT BEDTIME.        Allergies:  Allergies  Allergen Reactions   Compazine [Prochlorperazine] Anaphylaxis   Phenothiazines Anaphylaxis and Swelling    Caused her throat to close  Prednisone Itching, Swelling and Rash    Facial swelling   Pregabalin Itching and Swelling    Swelling and itching of hands and feet.       Past Medical History, Surgical history, Social history, and Family History were reviewed and updated.  Review of Systems: All other 10 point review of systems is negative.   Physical Exam:  height is 5\' 4"  (1.626 m) and weight is 191 lb 9.6 oz (86.9 kg). Her oral temperature is 98.6 F (37 C). Her blood pressure is 150/46 (abnormal) and her pulse is 68. Her respiration is 20 and oxygen saturation is 98%.   Wt Readings from Last 3 Encounters:  01/07/24 191 lb 9.6 oz (86.9 kg)  12/20/23 184 lb (83.5 kg)  10/22/23 176 lb (79.8 kg)     Ocular: Sclerae unicteric, pupils equal, round and reactive to light Ear-nose-throat: Oropharynx clear, dentition fair Lymphatic: No cervical or supraclavicular adenopathy Lungs no rales or rhonchi, good excursion bilaterally Heart regular rate and rhythm, no murmur appreciated Abd soft, nontender, positive bowel sounds MSK no focal spinal tenderness, no joint edema Neuro: non-focal, well-oriented, appropriate affect Breasts: Deferred   Lab Results  Component Value Date   WBC 5.6 01/07/2024   HGB 11.5 (L) 01/07/2024   HCT 35.9 (L) 01/07/2024   MCV 93.2 01/07/2024   PLT 242 01/07/2024   Lab Results  Component Value Date   FERRITIN 123 02/14/2023   IRON 93 02/14/2023   TIBC 392 02/14/2023   UIBC 299 02/14/2023   IRONPCTSAT 24 02/14/2023   Lab Results  Component Value Date   RETICCTPCT 1.5 01/07/2024   RBC 3.85 (L) 01/07/2024   RBC 3.82 (L) 01/07/2024   No results found for: "KPAFRELGTCHN", "LAMBDASER", "KAPLAMBRATIO" No results found for: "IGGSERUM", "IGA", "IGMSERUM" No results found for: "TOTALPROTELP", "ALBUMINELP", "A1GS", "A2GS", "BETS", "BETA2SER", "GAMS", "MSPIKE", "SPEI"   Chemistry      Component Value Date/Time   NA 136 08/14/2023 1357   K 4.8 08/14/2023 1357   CL 95 (L) 08/14/2023 1357   CO2 26 08/14/2023 1357   BUN 17 08/14/2023 1357   CREATININE 0.68 08/14/2023 1357   CREATININE 0.80 02/20/2023 1128      Component Value Date/Time   CALCIUM 9.6 08/14/2023 1357   ALKPHOS 107 08/14/2023 1357   AST 30 08/14/2023 1357   AST 30 09/07/2021 1433   ALT 29 08/14/2023 1357   ALT 31 09/07/2021 1433   BILITOT 0.4 08/14/2023 1357   BILITOT 0.3 09/07/2021 1433       Impression and Plan: Ms. 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  Iron studies pending. We will replace if needed.  Port flush every 2 months and follow-up in 6  months.   Kennard Pea, NP 2/10/20251:50 PM

## 2024-01-08 ENCOUNTER — Ambulatory Visit: Payer: Medicare Other | Admitting: Physical Therapy

## 2024-01-08 ENCOUNTER — Encounter: Payer: Self-pay | Admitting: Physical Therapy

## 2024-01-08 DIAGNOSIS — M6281 Muscle weakness (generalized): Secondary | ICD-10-CM

## 2024-01-08 DIAGNOSIS — M25551 Pain in right hip: Secondary | ICD-10-CM

## 2024-01-08 NOTE — Telephone Encounter (Signed)

## 2024-01-08 NOTE — Therapy (Signed)
OUTPATIENT PHYSICAL THERAPY LOWER EXTREMITY TREATMENT   Patient Name: Heather Thompson MRN: [id number] DOB:1951/03/14, 73 y.o., female Today's Date: 01/08/2024  END OF SESSION:  PT End of Session - 01/08/24 1223     Visit Number 7    Number of Visits 16    Date for PT Re-Evaluation 02/06/24    Authorization Type Medicare    Authorization - Visit Number 7    Progress Note Due on Visit 10    PT Start Time 1145    PT Stop Time 1224    PT Time Calculation (min) 39 min    Activity Tolerance Patient tolerated treatment well    Behavior During Therapy WFL for tasks assessed/performed                   Past Medical History:  Diagnosis Date   Abnormality of gait 04/23/2019   Acquired hypothyroidism 07/29/2021   Allergic rhinitis due to animal hair and dander 12/02/2015   Formatting of this note might be different from the original.  [facility]  Seen by Dr. Rockwell Germany 11/11/15     Plan- Received allergy shots today. Follow up 1 year.     Allergy    Anemia    Anemia    Anxiety    Arthritis of scaphoid-trapezium-trapezoid joint of right hand 09/10/2023   B12 deficiency    Back pain    Chronic fatigue 07/29/2021   Chronic fatigue syndrome    Chronic throat clearing 03/14/2023   Clotting disorder (HCC)    Constipation    Depression with anxiety 04/07/2009   Formatting of this note might be different from the original.  well controlled on current meds, follows with psychiatrist in [place]. Had a   h/o severe depression 5 years ago, and had ECT.     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  B12 deficiency   Iron deficiency     Herniated lumbar  intervertebral disc 11/12/2020   High cholesterol    History of blood clots    History of cervical spinal surgery 01/01/2018   Formatting of this note might be different from the original.  Fusion C6 and C7 per 2018 MRI report.     History of left hip replacement 01/14/2018   History of Roux-en-Y gastric bypass    Hoarseness 03/14/2023   Hyperlipidemia 08/19/2010   Hypertension    Hyponatremia 01/17/2018   Hypothyroidism    Hypothyroidism    IBS (irritable bowel syndrome) 04/06/2021   Impaired fasting glucose 08/19/2010   Incontinence 10/30/2013   Formatting of this note might be different from the original. Formatting of this note might be different from the original. Urinary Incontinence     Infertility, female    Insomnia 04/07/2009   Formatting of this note might be different from the original.  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  Followed by [facility]  Seen  by Dr. Reginia Naas 03/01/15     S/p changing thoracic spinal cord stimulator generator 08/18/14.     Lumbar spine films show spinal cord generator in left flank area. 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  Follows with Rheumatology in [facility] of this note might be different from the original.  Myoadenylate deaminase deficiency, diagnosed back in 1984, after muscle   biopsies     Neutrophilic leukocytosis 08/12/2012   OAB  (overactive bladder) 08/28/2023   Obesity (BMI 30-39.9) 01/01/2018   Osteoarthritis    Osteomalacia 04/23/2019   Osteoporosis, senile 11/15/2010   Other myositis, left thigh 04/24/2023   Pain management contract agreement 11/17/2016   Formatting of this note might be different from the original.  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  Getting iron infusion via port at hematalogy/oncology center at [facility].   Was recently hospitalized in [facility] for severe anemia     Formatting of this note might be different from the original.  on OTC b12 currently     Vitamin D deficiency    Past Surgical History:  Procedure Laterality Date   ABDOMINAL HYSTERECTOMY     APPENDECTOMY     CARPAL TUNNEL RELEASE Bilateral 2023   with thumb joint replacement    CERVICAL FUSION     CHOLECYSTECTOMY     FOOT SURGERY Bilateral    fusion   GASTRIC BYPASS     SPINAL CORD STIMULATOR INSERTION  07/2023   TOTAL HIP ARTHROPLASTY Bilateral    11/2016 & 05/2017   Patient Active Problem List   Diagnosis Date Noted   Palpitations 10/04/2023   Cardiac murmur 10/04/2023   Allergy    Anemia    Anxiety    B12 deficiency    Back pain    Chronic fatigue syndrome    Clotting disorder (HCC)    Constipation    Edema of both lower extremities    Fatty liver    Fibromyalgia  Gallbladder problem    High cholesterol    History of blood clots    History of Roux-en-Y gastric bypass    Hypothyroidism    Infertility, female    Joint pain    Kidney stones    Muscle disorder    Osteoarthritis    Prediabetes    Salzmann nodular degeneration    SOBOE (shortness of breath on exertion)    Swallowing difficulty    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   Arthritis of scaphoid-trapezium-trapezoid joint of right hand 09/10/2023   Urge incontinence 08/28/2023   OAB (overactive bladder) 08/28/2023   Lumbar post-laminectomy syndrome 07/06/2023   Snapping hip syndrome, 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   Other myositis, left thigh 04/24/2023   Sacroiliitis, not elsewhere classified (HCC) 04/24/2023   Dysphagia 04/17/2023   Elevated liver enzymes 04/02/2023   Elevated TSH 04/02/2023   Chronic throat clearing 03/14/2023   Globus sensation 03/14/2023   Hoarseness 03/14/2023   Muscle tension dysphonia 03/12/2023   Unspecified inflammatory spondylopathy, 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   Primary osteoarthritis of both knees 12/21/2021   Port-A-Cath in place 07/29/2021   Acquired hypothyroidism 07/29/2021   Chronic fatigue 07/29/2021   Vitamin B12 deficiency 07/29/2021   Vitamin D deficiency 07/29/2021   Postmenopausal estrogen deficiency 07/29/2021   IBS (irritable bowel syndrome) 04/06/2021   Retention of urine 04/06/2021   Herniated lumbar intervertebral disc  11/12/2020   Skin sensation disturbance 11/12/2020   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   Spondylopathy, unspecified 11/12/2020   Trochanteric bursitis of both hips 11/19/2019   Low serum iron 04/23/2019   Osteomalacia 04/23/2019   Abnormality of gait 04/23/2019   History of bilateral hip arthroplasty 06/26/2018   Gastroesophageal reflux disease 06/18/2018   Hypertension 06/18/2018   Intertrigo 04/30/2018   Lentigines 04/30/2018   Hyponatremia 01/17/2018   History of left hip replacement 01/14/2018   History of cervical spinal surgery 01/01/2018   Obesity (BMI 30-39.9) 01/01/2018   SK (seborrheic keratosis) 04/18/2017   Pain management contract agreement 11/17/2016   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   Allergic rhinitis due to animal hair and dander 12/02/2015   Low back pain 03/03/2015   Incontinence 10/30/2013   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   Neutrophilic leukocytosis 08/12/2012   Thrombocytosis 08/12/2012   Osteoporosis, senile 11/15/2010   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   Hyperlipidemia 08/19/2010   Impaired fasting glucose 08/19/2010   Polyarthritis 09/28/2009   Degenerative disc disease 04/07/2009   Depression with anxiety 04/07/2009   Myopathy 04/07/2009   Insomnia 04/07/2009   Migraine 04/07/2009   Scoliosis 04/07/2009   Seasonal allergies 04/07/2009   Vitamin B12 deficiency anemia 04/07/2009    PCP: Christen Butter  REFERRING PROVIDER: Verdell Carmine DIAG: bilateral trochanteric bursitis  THERAPY DIAG:  Bilateral hip pain  Muscle weakness (generalized)  Rationale for Evaluation and Treatment: Rehabilitation  ONSET DATE: 04/2023  SUBJECTIVE:   SUBJECTIVE STATEMENT: Pt has some increased pain in Rt hip today. Neck is still bothering her  PERTINENT HISTORY: Bilat THA 2018, fibromyalgia, OA, spinal cord stimulator, bilat TC joint fusion  Pt had bilateral hip replacements in 2018. Pt began having Lt hip and groin pain last summer that was so  severe she went to ED. She was eventually diagnosed with bursitis. Since that time the pain has continued and is "always there". She now has bilateral hip pain, groin pain. Hips are tender to touch. Pain increases with laying on her sides, pain with sit to stand, prolonged standing and walking. Pt has not  found a way to relieve pain PAIN:  Are you having pain? Yes: NPRS scale: 5/10 currently, 8-9/10 at worst Pain location: bilat hips Lt > Rt Pain description: sore, tender Aggravating factors: sitting in recliner, laying on sides, sit to stand Relieving factors: unknown  PRECAUTIONS: pt has spinal cord stimulator  RED FLAGS: None   WEIGHT BEARING RESTRICTIONS: No  FALLS:  Has patient fallen in last 6 months? No  OCCUPATION: retired  PLOF: Independent  PATIENT GOALS: reduce pain  NEXT MD VISIT: February 2025  OBJECTIVE:  Note: Objective measures were completed at Evaluation unless otherwise noted.  DIAGNOSTIC FINDINGS: x ray performed but not read at time as eval  PATIENT SURVEYS:  FOTO 39   MUSCLE LENGTH: Hamstrings: decreased bilat Hip flexors: decreased bilat   PALPATION: TTP bilat hip flexors, ITB, glutes, HS Increased mm spasticity bilat hamstrings, glutes, quads  LOWER EXTREMITY ROM:  Active ROM Right eval Left eval  Hip flexion    Hip extension    Hip abduction    Hip adduction    Hip internal rotation    Hip external rotation 30 50  Knee flexion    Knee extension    Ankle dorsiflexion    Ankle plantarflexion    Ankle inversion    Ankle eversion     (Blank rows = not tested)  LOWER EXTREMITY MMT:  MMT Right eval Left eval  Hip flexion 3+ 3  Hip extension 3 3  Hip abduction 3 3  Hip adduction    Hip internal rotation    Hip external rotation 3 3+  Knee flexion    Knee extension    Ankle dorsiflexion    Ankle plantarflexion    Ankle inversion    Ankle eversion     (Blank rows = not tested)   FUNCTIONAL TESTS:  5 times sit to  stand: 18.85 seconds   [facility] Adult PT Treatment:                                                DATE: 01/08/24 Therapeutic Exercise/Activity: 5 x STS 18.59 seconds Standing hip IR ball on wall x 10 bilat -- increased pain in quad Attempted seated IR with ball between knees - still increased pain Captain morgan x 10 bilat -- increased pain in Rt quad Green TB pull down (shld ext) with slow march Tandem stance with green TB row Pallof green TB x 12 bilat Attempted isometric dead bug with physioball - limited by increased pain in quads today Seated hip flexor stretch bilat Education on updated HEP - rationale for treatment   [facility] Adult PT Treatment:                                                DATE: 01/02/24 Therapeutic Exercise/Therapeutic Activity: Side step red TB Standing hip ext with knee flexion squeezing pillow x 10 bilat Rocker board lateral taps x 1 min Rocker board lateral static hold x 1 min Seated hip IR with ball between knees 2 x 10 SLR wit hip abd/add over 3# dumbell x 10 bilat Side plank on wall 3 x 10 sec bilat Green TB pull down (shld ext) with slow march Tandem stance with green TB row  [facility] Adult PT Treatment:                                                DATE: 12/31/23 Therapeutic Exercise: Side step red TB Standing hip ext with knee flexion x 15 bilat Standing hip abd with knee flexion x 12 bilat Rocker board lateral taps x 1 min Rocker board lateral static hold x 1 min Tandem stance on foam 2 x 30 sec Slider hip 3 way x 8 bilat Seated hip IR with ball between knees 2 x 10 Seated hip ER with red TB 2 x 10 Seated HS stretch 2 x 30 sec bilat Seated figure 4 stretch 2 x 30 sec bilat   [facility] Adult PT Treatment:                                                DATE: 12/27/23 Therapeutic Exercise: Nustep L6 x 5 min for warm up and subjective intake Standing hip ext with knee flexion x 15 bilat Standing hip abd with knee flexion x 12 bilat Side step green TB -  UE support on table TKE ball on wall  x 12 bilat 3 way hip on slider x 8 bilat - more difficult on Lt Treadmill walking uphill1. increasing grade each minute to 3, then downhill 1.1 mph decreasing grade each minute Seated HS stretch 2 x 30 sec bilat Seated figure 4 stretch 2 x 30 sec bilat                                                                                                                               PATIENT EDUCATION:  Education details: PT POC and goals, HEP Person educated: Patient Education method: Explanation, Demonstration, and Handouts Education comprehension: verbalized understanding and returned demonstration  HOME EXERCISE PROGRAM: Access Code: [email or web address] URL: [email or web address] Date: 01/08/2024 Prepared by: Reggy Eye  Exercises - Sit to Stand  - 1 x daily - 7 x weekly - 2 sets - 10 reps - Seated Hamstring Stretch  - 1 x daily - 7 x weekly - 1 sets - 3 reps - 20-30 seconds hold - Seated Figure 4 Piriformis Stretch  - 1 x daily - 7 x weekly - 1 sets - 3 reps - 20-30 seconds hold - Standing Hip Abduction with Bent Knee  - 1 x daily - 7 x weekly - 3 sets - 10 reps - Standing Hip Extension with Leg Bent and Support  - 1 x daily - 7 x weekly - 3 sets - 10 reps - Single Leg 3 Way Reach with Support  - 1 x daily - 7 x weekly - 3 sets - 10 reps -  Seated Hip Internal Rotation with Ball and Resistance  - 1 x daily - 7 x weekly - 3 sets - 10 reps - Seated Hip Flexor Stretch  - 1 x daily - 7 x weekly - 1 sets - 3 reps - 30 seconds hold - Resistance Pulldown with March  - 1 x daily - 7 x weekly - 3 sets - 10 reps - Standing Bilateral Low Shoulder Row with Anchored Resistance  - 1 x daily - 7 x weekly - 3 sets - 10 reps - Standing Anti-Rotation Press with Anchored Resistance  - 1 x daily - 7 x weekly - 3 sets - 10 reps  ASSESSMENT:  CLINICAL IMPRESSION: Pt with increased irritability of symptoms this session. Modified program to focus on core  strength vs hip strengthening. Updated HEP to include core exercises as those were tolerable this visit. Pt is to follow up with MD next week  OBJECTIVE IMPAIRMENTS: decreased activity tolerance, decreased mobility, difficulty walking, decreased ROM, decreased strength, increased muscle spasms, impaired flexibility, and pain.     GOALS: Goals reviewed with patient? Yes  SHORT TERM GOALS: Target date: 01/09/2024   Pt will be independent with initial HEP Baseline: Goal status: MET  2.  Pt will improve FOTO to > = 45 to demo improving functional mobility Baseline:  Goal status: INITIAL   LONG TERM GOALS: Target date: 02/06/2024    Pt will be independent with advanced HEP and plan for continued exercise in community Baseline:  Goal status: INITIAL  2.  Pt will improve FOTO to >= 50 to demo improved functional mobility Baseline:  Goal status: INITIAL  3.  Pt will improve hip strength to 4+/5 bilat to improve standing and walking tolerance Baseline:  Goal status: INITIAL  4.  Pt will tolerate laying on both sides with pain <= 2/10 Baseline:  Goal status: INITIAL  5.  Pt will improve 5 x STS to <= 15 seconds Baseline:  Goal status: INITIAL    PLAN:  PT FREQUENCY: 2x/week  PT DURATION: 8 weeks  PLANNED INTERVENTIONS: [id number]- PT Re-evaluation, 97110-Therapeutic exercises, 97530- Therapeutic activity, 97112- Neuromuscular re-education, 97535- Self Care, [id number]- Manual therapy, [id number]- Aquatic Therapy, 97035- Ultrasound, [id number]- Ionotophoresis 4mg /ml Dexamethasone, Patient/Family education, Balance training, Taping, Dry Needling, Cryotherapy, and Moist heat  PLAN FOR NEXT SESSION: CHECK FOTO balance, hip stability, hip strengthening   Landry Lookingbill, PT 01/08/2024, 12:24 PM

## 2024-01-11 ENCOUNTER — Ambulatory Visit: Payer: Medicare Other | Admitting: Physical Therapy

## 2024-01-11 ENCOUNTER — Encounter: Payer: Self-pay | Admitting: Physical Therapy

## 2024-01-11 DIAGNOSIS — M6281 Muscle weakness (generalized): Secondary | ICD-10-CM

## 2024-01-11 DIAGNOSIS — M25551 Pain in right hip: Secondary | ICD-10-CM

## 2024-01-11 NOTE — Therapy (Signed)
OUTPATIENT PHYSICAL THERAPY LOWER EXTREMITY TREATMENT   Patient Name: Heather Thompson MRN: [id number] DOB:1951-01-09, 73 y.o., female Today's Date: 01/11/2024  END OF SESSION:  PT End of Session - 01/11/24 1144     Visit Number 8    Number of Visits 16    Date for PT Re-Evaluation 02/06/24    Authorization Type Medicare    Authorization - Visit Number 8    Progress Note Due on Visit 10    PT Start Time 1100    PT Stop Time 1144    PT Time Calculation (min) 44 min    Activity Tolerance Patient tolerated treatment well    Behavior During Therapy [facility] for tasks assessed/performed                    Past Medical History:  Diagnosis Date   Abnormality of gait 04/23/2019   Acquired hypothyroidism 07/29/2021   Allergic rhinitis due to animal hair and dander 12/02/2015   Formatting of this note might be different from the original.  [facility]  Seen by Dr. Rockwell Germany 11/11/15     Plan- Received allergy shots today. Follow up 1 year.     Allergy    Anemia    Anemia    Anxiety    Arthritis of scaphoid-trapezium-trapezoid joint of right hand 09/10/2023   B12 deficiency    Back pain    Chronic fatigue 07/29/2021   Chronic fatigue syndrome    Chronic throat clearing 03/14/2023   Clotting disorder (HCC)    Constipation    Depression with anxiety 04/07/2009   Formatting of this note might be different from the original.  well controlled on current meds, follows with psychiatrist in [place]. Had a   h/o severe depression 5 years ago, and had ECT.     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  B12 deficiency   Iron deficiency     Herniated lumbar  intervertebral disc 11/12/2020   High cholesterol    History of blood clots    History of cervical spinal surgery 01/01/2018   Formatting of this note might be different from the original.  Fusion C6 and C7 per 2018 MRI report.     History of left hip replacement 01/14/2018   History of Roux-en-Y gastric bypass    Hoarseness 03/14/2023   Hyperlipidemia 08/19/2010   Hypertension    Hyponatremia 01/17/2018   Hypothyroidism    Hypothyroidism    IBS (irritable bowel syndrome) 04/06/2021   Impaired fasting glucose 08/19/2010   Incontinence 10/30/2013   Formatting of this note might be different from the original. Formatting of this note might be different from the original. Urinary Incontinence     Infertility, female    Insomnia 04/07/2009   Formatting of this note might be different from the original.  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  Followed by [facility]  Seen by Dr. Reginia Naas 03/01/15     S/p changing thoracic spinal cord stimulator generator 08/18/14.     Lumbar spine films show spinal cord generator in left flank area. 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  Follows with Rheumatology in [facility] of this note might be different from the original.  Myoadenylate deaminase deficiency, diagnosed back in 1984, after muscle   biopsies     Neutrophilic leukocytosis 08/12/2012   OAB  (overactive bladder) 08/28/2023   Obesity (BMI 30-39.9) 01/01/2018   Osteoarthritis    Osteomalacia 04/23/2019   Osteoporosis, senile 11/15/2010   Other myositis, left thigh 04/24/2023   Pain management contract agreement 11/17/2016   Formatting of this note might be different from the original.  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  Getting iron infusion via port at hematalogy/oncology center at [facility].   Was recently hospitalized in [facility] for severe anemia     Formatting of this note might be different from the original.  on OTC b12 currently     Vitamin D deficiency    Past Surgical History:  Procedure Laterality Date   ABDOMINAL HYSTERECTOMY     APPENDECTOMY     CARPAL TUNNEL RELEASE Bilateral 2023   with thumb joint replacement    CERVICAL FUSION     CHOLECYSTECTOMY     FOOT SURGERY Bilateral    fusion   GASTRIC BYPASS     SPINAL CORD STIMULATOR INSERTION  07/2023   TOTAL HIP ARTHROPLASTY Bilateral    11/2016 & 05/2017   Patient Active Problem List   Diagnosis Date Noted   Palpitations 10/04/2023   Cardiac murmur 10/04/2023   Allergy    Anemia    Anxiety    B12 deficiency    Back pain    Chronic fatigue syndrome    Clotting disorder (HCC)    Constipation    Edema of both lower extremities    Fatty liver  Fibromyalgia    Gallbladder problem    High cholesterol    History of blood clots    History of Roux-en-Y gastric bypass    Hypothyroidism    Infertility, female    Joint pain    Kidney stones    Muscle disorder    Osteoarthritis    Prediabetes    Salzmann nodular degeneration    SOBOE (shortness of breath on exertion)    Swallowing difficulty    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   Arthritis of scaphoid-trapezium-trapezoid joint of right hand 09/10/2023   Urge incontinence 08/28/2023   OAB (overactive bladder) 08/28/2023   Lumbar post-laminectomy syndrome 07/06/2023   Snapping hip syndrome, 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   Other myositis, left thigh 04/24/2023   Sacroiliitis, not elsewhere classified (HCC) 04/24/2023   Dysphagia 04/17/2023   Elevated liver enzymes 04/02/2023   Elevated TSH 04/02/2023   Chronic throat clearing 03/14/2023   Globus sensation 03/14/2023   Hoarseness 03/14/2023   Muscle tension dysphonia 03/12/2023   Unspecified inflammatory spondylopathy, 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   Primary osteoarthritis of both knees 12/21/2021   Port-A-Cath in place 07/29/2021   Acquired hypothyroidism 07/29/2021   Chronic fatigue 07/29/2021   Vitamin B12 deficiency 07/29/2021   Vitamin D deficiency 07/29/2021   Postmenopausal estrogen deficiency 07/29/2021   IBS (irritable bowel syndrome) 04/06/2021   Retention of urine 04/06/2021   Herniated lumbar intervertebral disc  11/12/2020   Skin sensation disturbance 11/12/2020   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   Spondylopathy, unspecified 11/12/2020   Trochanteric bursitis of both hips 11/19/2019   Low serum iron 04/23/2019   Osteomalacia 04/23/2019   Abnormality of gait 04/23/2019   History of bilateral hip arthroplasty 06/26/2018   Gastroesophageal reflux disease 06/18/2018   Hypertension 06/18/2018   Intertrigo 04/30/2018   Lentigines 04/30/2018   Hyponatremia 01/17/2018   History of left hip replacement 01/14/2018   History of cervical spinal surgery 01/01/2018   Obesity (BMI 30-39.9) 01/01/2018   SK (seborrheic keratosis) 04/18/2017   Pain management contract agreement 11/17/2016   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   Allergic rhinitis due to animal hair and dander 12/02/2015   Low back pain 03/03/2015   Incontinence 10/30/2013   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   Neutrophilic leukocytosis 08/12/2012   Thrombocytosis 08/12/2012   Osteoporosis, senile 11/15/2010   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   Hyperlipidemia 08/19/2010   Impaired fasting glucose 08/19/2010   Polyarthritis 09/28/2009   Degenerative disc disease 04/07/2009   Depression with anxiety 04/07/2009   Myopathy 04/07/2009   Insomnia 04/07/2009   Migraine 04/07/2009   Scoliosis 04/07/2009   Seasonal allergies 04/07/2009   Vitamin B12 deficiency anemia 04/07/2009    PCP: Christen Butter  REFERRING PROVIDER: Verdell Carmine DIAG: bilateral trochanteric bursitis  THERAPY DIAG:  Bilateral hip pain  Muscle weakness (generalized)  Rationale for Evaluation and Treatment: Rehabilitation  ONSET DATE: 04/2023  SUBJECTIVE:   SUBJECTIVE STATEMENT: Pt is still "hurting all over" due to neck pain. She states both hips are hurting again. She is still doing better with laying on her sides  PERTINENT HISTORY: Bilat THA 2018, fibromyalgia, OA, spinal cord stimulator, bilat TC joint fusion  Pt had bilateral hip replacements in  2018. Pt began having Lt hip and groin pain last summer that was so severe she went to ED. She was eventually diagnosed with bursitis. Since that time the pain has continued and is "always there". She now has bilateral hip pain, groin pain. Hips are tender to touch. Pain increases  with laying on her sides, pain with sit to stand, prolonged standing and walking. Pt has not found a way to relieve pain PAIN:  Are you having pain? Yes: NPRS scale: 7/10 currently, 8-9/10 at worst Pain location: bilat hips Lt > Rt Pain description: sore, tender Aggravating factors: sitting in recliner, laying on sides, sit to stand Relieving factors: unknown  PRECAUTIONS: pt has spinal cord stimulator  RED FLAGS: None   WEIGHT BEARING RESTRICTIONS: No  FALLS:  Has patient fallen in last 6 months? No  OCCUPATION: retired  PLOF: Independent  PATIENT GOALS: reduce pain  NEXT MD VISIT: February 2025  OBJECTIVE:  Note: Objective measures were completed at Evaluation unless otherwise noted.  DIAGNOSTIC FINDINGS: x ray performed but not read at time as eval  PATIENT SURVEYS:  FOTO 39   MUSCLE LENGTH: Hamstrings: decreased bilat Hip flexors: decreased bilat   PALPATION: TTP bilat hip flexors, ITB, glutes, HS Increased mm spasticity bilat hamstrings, glutes, quads  LOWER EXTREMITY ROM:  Active ROM Right eval Left eval  Hip flexion    Hip extension    Hip abduction    Hip adduction    Hip internal rotation    Hip external rotation 30 50  Knee flexion    Knee extension    Ankle dorsiflexion    Ankle plantarflexion    Ankle inversion    Ankle eversion     (Blank rows = not tested)  LOWER EXTREMITY MMT:  MMT Right eval Left eval  Hip flexion 3+ 3  Hip extension 3 3  Hip abduction 3 3  Hip adduction    Hip internal rotation    Hip external rotation 3 3+  Knee flexion    Knee extension    Ankle dorsiflexion    Ankle plantarflexion    Ankle inversion    Ankle eversion      (Blank rows = not tested)   FUNCTIONAL TESTS:  5 times sit to stand: 18.85 seconds   [facility] Adult PT Treatment:                                                DATE: 01/11/24 Therapeutic Exercise/Activity: Side step green TB Standing hip ext with knee flexion x 10 bilat Standing hip abd with knee flexion x 10 bilat STS without UE support x 12 Green TB pull down (shld ext) with slow march Tandem stance with green TB row Side step up with hip abd 4'' step x 8 bilat Tandem stance on airex with trunk rotation 2 x 30 sec bilat Seated Hip flexor stretch bilat 2 x 30 sec FOTO 33   [facility] Adult PT Treatment:                                                DATE: 01/08/24 Therapeutic Exercise/Activity: 5 x STS 18.59 seconds Standing hip IR ball on wall x 10 bilat -- increased pain in quad Attempted seated IR with ball between knees - still increased pain Captain morgan x 10 bilat -- increased pain in Rt quad Green TB pull down (shld ext) with slow march Tandem stance with green TB row Pallof green TB x 12 bilat Attempted isometric dead bug with physioball - limited  by increased pain in quads today Seated hip flexor stretch bilat Education on updated HEP - rationale for treatment   [facility] Adult PT Treatment:                                                DATE: 01/02/24 Therapeutic Exercise/Therapeutic Activity: Side step red TB Standing hip ext with knee flexion squeezing pillow x 10 bilat Rocker board lateral taps x 1 min Rocker board lateral static hold x 1 min Seated hip IR with ball between knees 2 x 10 SLR wit hip abd/add over 3# dumbell x 10 bilat Side plank on wall 3 x 10 sec bilat Green TB pull down (shld ext) with slow march Tandem stance with green TB row   [facility] Adult PT Treatment:                                                DATE: 12/31/23 Therapeutic Exercise: Side step red TB Standing hip ext with knee flexion x 15 bilat Standing hip abd with knee flexion x 12 bilat Rocker  board lateral taps x 1 min Rocker board lateral static hold x 1 min Tandem stance on foam 2 x 30 sec Slider hip 3 way x 8 bilat Seated hip IR with ball between knees 2 x 10 Seated hip ER with red TB 2 x 10 Seated HS stretch 2 x 30 sec bilat Seated figure 4 stretch 2 x 30 sec bilat                                                                                                                               PATIENT EDUCATION:  Education details: PT POC and goals, HEP Person educated: Patient Education method: Explanation, Demonstration, and Handouts Education comprehension: verbalized understanding and returned demonstration  HOME EXERCISE PROGRAM: Access Code: [email or web address] URL: [email or web address] Date: 01/08/2024 Prepared by: Reggy Eye  Exercises - Sit to Stand  - 1 x daily - 7 x weekly - 2 sets - 10 reps - Seated Hamstring Stretch  - 1 x daily - 7 x weekly - 1 sets - 3 reps - 20-30 seconds hold - Seated Figure 4 Piriformis Stretch  - 1 x daily - 7 x weekly - 1 sets - 3 reps - 20-30 seconds hold - Standing Hip Abduction with Bent Knee  - 1 x daily - 7 x weekly - 3 sets - 10 reps - Standing Hip Extension with Leg Bent and Support  - 1 x daily - 7 x weekly - 3 sets - 10 reps - Single Leg 3 Way Reach with Support  - 1 x daily - 7 x weekly - 3 sets -  10 reps - Seated Hip Internal Rotation with Ball and Resistance  - 1 x daily - 7 x weekly - 3 sets - 10 reps - Seated Hip Flexor Stretch  - 1 x daily - 7 x weekly - 1 sets - 3 reps - 30 seconds hold - Resistance Pulldown with March  - 1 x daily - 7 x weekly - 3 sets - 10 reps - Standing Bilateral Low Shoulder Row with Anchored Resistance  - 1 x daily - 7 x weekly - 3 sets - 10 reps - Standing Anti-Rotation Press with Anchored Resistance  - 1 x daily - 7 x weekly - 3 sets - 10 reps  ASSESSMENT:  CLINICAL IMPRESSION: Pt able to tolerate more hip specific strengthening today despite pain. She has decreased functional  outcome on FOTO due to increased pain. She is challenged by balance training and will benefit from continue dynamic and static balance as well as hip and core strengthening  OBJECTIVE IMPAIRMENTS: decreased activity tolerance, decreased mobility, difficulty walking, decreased ROM, decreased strength, increased muscle spasms, impaired flexibility, and pain.     GOALS: Goals reviewed with patient? Yes  SHORT TERM GOALS: Target date: 01/09/2024   Pt will be independent with initial HEP Baseline: Goal status: MET  2.  Pt will improve FOTO to > = 45 to demo improving functional mobility Baseline: 33 on 01/11/24 Goal status: IN PROGRESS   LONG TERM GOALS: Target date: 02/06/2024    Pt will be independent with advanced HEP and plan for continued exercise in community Baseline:  Goal status: INITIAL  2.  Pt will improve FOTO to >= 50 to demo improved functional mobility Baseline:  Goal status: INITIAL  3.  Pt will improve hip strength to 4+/5 bilat to improve standing and walking tolerance Baseline:  Goal status: INITIAL  4.  Pt will tolerate laying on both sides with pain <= 2/10 Baseline:  Goal status: INITIAL  5.  Pt will improve 5 x STS to <= 15 seconds Baseline:  Goal status: INITIAL    PLAN:  PT FREQUENCY: 2x/week  PT DURATION: 8 weeks  PLANNED INTERVENTIONS: [id number]- PT Re-evaluation, 97110-Therapeutic exercises, 97530- Therapeutic activity, 97112- Neuromuscular re-education, 97535- Self Care, [id number]- Manual therapy, [id number]- Aquatic Therapy, 97035- Ultrasound, [id number]- Ionotophoresis 4mg /ml Dexamethasone, Patient/Family education, Balance training, Taping, Dry Needling, Cryotherapy, and Moist heat  PLAN FOR NEXT SESSION: balance, hip stability, hip strengthening   Rolly Magri, PT 01/11/2024, 11:46 AM

## 2024-01-14 ENCOUNTER — Telehealth (INDEPENDENT_AMBULATORY_CARE_PROVIDER_SITE_OTHER): Payer: Medicare Other | Admitting: Psychology

## 2024-01-15 ENCOUNTER — Ambulatory Visit (INDEPENDENT_AMBULATORY_CARE_PROVIDER_SITE_OTHER): Payer: Medicare Other | Admitting: Nurse Practitioner

## 2024-01-15 ENCOUNTER — Ambulatory Visit: Payer: Medicare Other | Admitting: Physician Assistant

## 2024-01-15 ENCOUNTER — Encounter: Payer: Self-pay | Admitting: Nurse Practitioner

## 2024-01-15 ENCOUNTER — Ambulatory Visit (HOSPITAL_COMMUNITY): Payer: Medicare Other | Admitting: Psychiatry

## 2024-01-15 VITALS — BP 135/80 | HR 77 | Temp 98.0°F | Ht 64.0 in | Wt 189.0 lb

## 2024-01-15 DIAGNOSIS — R7303 Prediabetes: Secondary | ICD-10-CM

## 2024-01-15 DIAGNOSIS — Z6832 Body mass index (BMI) 32.0-32.9, adult: Secondary | ICD-10-CM

## 2024-01-15 DIAGNOSIS — E66811 Obesity, class 1: Secondary | ICD-10-CM | POA: Diagnosis not present

## 2024-01-15 DIAGNOSIS — E6609 Other obesity due to excess calories: Secondary | ICD-10-CM

## 2024-01-15 NOTE — Patient Instructions (Signed)

## 2024-01-15 NOTE — Progress Notes (Addendum)
Office: [phone]  /  Fax: [phone]  WEIGHT SUMMARY AND BIOMETRICS  Weight Lost Since Last Visit: 0lb  Weight Gained Since Last Visit: 5lb   Vitals Temp: 98 F (36.7 C) BP: 135/80 Pulse Rate: 77 SpO2: 98 %   Anthropometric Measurements Height: 5\' 4"  (1.626 m) Weight: 189 lb (85.7 kg) BMI (Calculated): 32.43 Weight at Last Visit: 184lb Weight Lost Since Last Visit: 0lb Weight Gained Since Last Visit: 5lb Starting Weight: 178lb Total Weight Loss (lbs): 0 lb (0 kg)   Body Composition  Body Fat %: 47 % Fat Mass (lbs): 88.8 lbs Muscle Mass (lbs): 95.2 lbs Total Body Water (lbs): 73.4 lbs Visceral Fat Rating : 14   Other Clinical Data Fasting: Yes Labs: No Today's Visit #: 10 Starting Date: 03/27/23     HPI  Chief Complaint: OBESITY  Tonianne is here to discuss her progress with her obesity treatment plan. She is on the the Category 2 Plan and states she is following her eating plan approximately 50 % of the time. She states she is exercising 15-20 minutes 3-4 days per week.   Interval History:  Since last office visit she has gained 5 pounds.  She saw Dr. Dewaine Conger last on 01/01/24. She went to [place] last [place] to see her daughter.   She notes she is following the plan 50% of the time and the other times she will avoid eating all day and will only eat once daily.  She doesn't eat because she wants to lose weight.  She blames her weight gain on being alone.  She doesn't have anyone to cook for and she can't get a handle on eating on the way she needs to eat.  She states she can't get her brain to want to eat as she should.  She feels that since she is eating around 4 hours in the evening it balances her not eating all day. She does what she needs to do during the day and just doesn't eat.  She is very active in the community.  She can't get a handle on eating during the day. She would rather stay in the bed and doesn't want to take the time to eat.  She finds she eats  more in the evening because she remembers that she hasn't eaten all day and is at home alone. She reports she is not eating any meals, skipping BF, lunch and dinner but will eat late and until she goes to bed.  She stopped her therapist because "we are going in different paths".  She is only eating around 4 hours per day and when she does eat she eats junk.   She's going to [place] in Feb, May and Nov to visit family.    Pharmacotherapy for weight loss: She is not currently taking any meds for medical weight loss. Unable to take GLP-1s due to cost.  She is not a candidate for Phentermine or Qsymia due to age.  She is not a candidate for Contrave due to taking Wellbutrin.     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 Her highest weight was 267 lbs and nadir weight was 154 lbs. Her weight has fluctuated over the years. Highest weight since having surgery was 216 lbs.   Prediabetes Last A1c was 6.1  Medication(s): None Polyphagia:Yes Lab Results  Component Value Date   HGBA1C 6.1 (A) 08/14/2023   HGBA1C 6.1 08/14/2023  HGBA1C 6.2 (H) 04/10/2023   HGBA1C 6.2 (H) 12/19/2022   HGBA1C 6.0 (H) 04/06/2022   Lab Results  Component Value Date   INSULIN 8.7 03/27/2023   PHYSICAL EXAM:  Blood pressure 135/80, pulse 77, temperature 98 F (36.7 C), height 5\' 4"  (1.626 m), weight 189 lb (85.7 kg), SpO2 98%. Body mass index is 32.44 kg/m.  General: She is overweight, cooperative, alert, well developed, and in no acute distress. PSYCH: Has normal mood, affect and thought process.   Extremities: No edema.  Neurologic: No gross sensory or motor deficits. No tremors or fasciculations noted.    DIAGNOSTIC DATA REVIEWED:  BMET    Component Value Date/Time   NA 136 08/14/2023 1357   K 4.8 08/14/2023 1357   CL 95 (L) 08/14/2023 1357   CO2 26 08/14/2023 1357   GLUCOSE 101 (H) 08/14/2023 1357   GLUCOSE 92 02/20/2023  1128   BUN 17 08/14/2023 1357   CREATININE 0.68 08/14/2023 1357   CREATININE 0.80 02/20/2023 1128   CALCIUM 9.6 08/14/2023 1357   GFRNONAA >60 09/07/2021 1433   Lab Results  Component Value Date   HGBA1C 6.1 (A) 08/14/2023   HGBA1C 6.1 08/14/2023   HGBA1C 6.0 (H) 04/06/2022   Lab Results  Component Value Date   INSULIN 8.7 03/27/2023   Lab Results  Component Value Date   TSH 4.550 (H) 03/27/2023   CBC    Component Value Date/Time   WBC 5.6 01/07/2024 1315   WBC 4.7 12/19/2022 1157   RBC 3.85 (L) 01/07/2024 1315   RBC 3.82 (L) 01/07/2024 1315   HGB 11.5 (L) 01/07/2024 1315   HCT 35.9 (L) 01/07/2024 1315   PLT 242 01/07/2024 1315   MCV 93.2 01/07/2024 1315   MCH 29.9 01/07/2024 1315   MCHC 32.0 01/07/2024 1315   RDW 13.2 01/07/2024 1315   Iron Studies    Component Value Date/Time   IRON 58 01/07/2024 1315   TIBC 417 01/07/2024 1315   FERRITIN 93 01/07/2024 1315   IRONPCTSAT 14 01/07/2024 1315   IRONPCTSAT 25 12/19/2022 1157   Lipid Panel     Component Value Date/Time   CHOL 213 (H) 02/20/2023 1128   TRIG 106 02/20/2023 1128   HDL 89 02/20/2023 1128   CHOLHDL 2.4 02/20/2023 1128   LDLCALC 104 (H) 02/20/2023 1128   Hepatic Function Panel     Component Value Date/Time   PROT 6.9 08/14/2023 1357   ALBUMIN 4.2 08/14/2023 1357   AST 30 08/14/2023 1357   AST 30 09/07/2021 1433   ALT 29 08/14/2023 1357   ALT 31 09/07/2021 1433   ALKPHOS 107 08/14/2023 1357   BILITOT 0.4 08/14/2023 1357   BILITOT 0.3 09/07/2021 1433   BILIDIR 0.12 04/10/2023 1624      Component Value Date/Time   TSH 4.550 (H) 03/27/2023 0957   Nutritional Lab Results  Component Value Date   VD25OH 46.8 03/27/2023   VD25OH 78 07/10/2022     ASSESSMENT AND PLAN  TREATMENT PLAN FOR OBESITY:  Recommended Dietary Goals  Yuma is currently in the action stage of change. As such, her goal is to continue weight management plan. She has agreed to the Category 2 Plan.  Behavioral  Intervention  We discussed the following Behavioral Modification Strategies today: increasing lean protein intake to established goals, decreasing simple carbohydrates , increasing vegetables, increasing water intake , work on meal planning and preparation, reading food labels , keeping healthy foods at home, continue to work on implementation of  reduced calorie nutritional plan, continue to practice mindfulness when eating, planning for success, better snacking choices, staying on track while traveling and vacationing, and continue to work on maintaining a reduced calorie state, getting the recommended amount of protein, incorporating whole foods, making healthy choices, staying well hydrated and practicing mindfulness when eating..  Additional resources provided today: NA  Recommended Physical Activity Goals  Rosaisela has been advised to work up to 150 minutes of moderate intensity aerobic activity a week and strengthening exercises 2-3 times per week for cardiovascular health, weight loss maintenance and preservation of muscle mass.   She has agreed to Think about enjoyable ways to increase daily physical activity and overcoming barriers to exercise, Increase physical activity in their day and reduce sedentary time (increase NEAT)., Increase the intensity, frequency or duration of strengthening exercises , and Increase the intensity, frequency or duration of aerobic exercises     Pharmacotherapy She is not a candidate for anti obesity medications due to her eating habits/pattern.  She is eating around 4 hours per day and is eating junk when she does eat.  She would benefit from seeing a therapist for eating disorder and seeing RD.    ASSOCIATED CONDITIONS ADDRESSED TODAY  Action/Plan  Prediabetes -     Amb ref to Medical Nutrition Therapy-[facility]  Class 1 obesity due to excess calories without serious comorbidity with body mass index (BMI) of 32.0 to 32.9 in adult -     Amb ref to Medical  Nutrition Therapy-[facility]      Keep appt with Dr. Dewaine Conger on 01/29/24 and Dr. Gilmore Laroche on 01/22/24  Will refer her to see RD.   She is not a candidate for anti obesity medications.  We discussed this extensively today. She needs to see a therapist on a regular basis.     Return in about 4 weeks (around 02/12/2024).Marland Kitchen She was informed of the importance of frequent follow up visits to maximize her success with intensive lifestyle modifications for her multiple health conditions.   ATTESTASTION STATEMENTS:  Reviewed by clinician on day of visit: allergies, medications, problem list, medical history, surgical history, family history, social history, and previous encounter notes.   Time spent on visit including pre-visit chart review and post-visit care and charting was 40+ minutes.    Theodis Sato. Bishoy Cupp FNP-C

## 2024-01-16 ENCOUNTER — Encounter: Payer: Self-pay | Admitting: Medical-Surgical

## 2024-01-16 ENCOUNTER — Ambulatory Visit: Payer: Medicare Other | Admitting: Physical Therapy

## 2024-01-16 ENCOUNTER — Ambulatory Visit (INDEPENDENT_AMBULATORY_CARE_PROVIDER_SITE_OTHER): Payer: Medicare Other | Admitting: Medical-Surgical

## 2024-01-16 VITALS — BP 135/81 | HR 79 | Resp 20 | Ht 64.0 in | Wt 189.0 lb

## 2024-01-16 DIAGNOSIS — E66811 Obesity, class 1: Secondary | ICD-10-CM

## 2024-01-16 DIAGNOSIS — N6322 Unspecified lump in the left breast, upper inner quadrant: Secondary | ICD-10-CM | POA: Diagnosis not present

## 2024-01-16 DIAGNOSIS — Z6832 Body mass index (BMI) 32.0-32.9, adult: Secondary | ICD-10-CM

## 2024-01-16 DIAGNOSIS — E6609 Other obesity due to excess calories: Secondary | ICD-10-CM

## 2024-01-16 DIAGNOSIS — F4323 Adjustment disorder with mixed anxiety and depressed mood: Secondary | ICD-10-CM

## 2024-01-16 MED ORDER — BUPROPION HCL ER (XL) 150 MG PO TB24: 150.0000 mg | ORAL_TABLET | Freq: Every day | ORAL | 3 refills | Status: DC

## 2024-01-16 MED ORDER — BUPROPION HCL ER (XL) 300 MG PO TB24
300.0000 mg | ORAL_TABLET | Freq: Every day | ORAL | 0 refills | Status: DC
Start: 2024-01-16 — End: 2024-06-12

## 2024-01-16 NOTE — Progress Notes (Signed)
Established patient visit  History, exam, impression, and plan:  1. Mass of upper inner quadrant of left breast (Primary) Pleasant 73 year old female presenting today with reports of finding a lump in her left breast last week.  She is unsure how long the lump has been there as there was no pain, swelling, erythema or association change.  Now that she has the lump is there, she identifies that it is tender to touch.  Reports a history of having a similar issue to go in the right breast.  On exam, she does have some dry skin and rash underneath her breasts bilaterally and in the cleavage line.  Reports this is not bothersome.  Chaperone offered for breast exam but patient declined.  Able to identify the area of tenderness in the left inner upper quadrant of the breast at approximately 10-11:00 and 3 to 4 cm away from the nipple.  Unable to palpate a firm discrete nodule.  Plan for diagnostic mammogram and ultrasound for further evaluation.  She was due a screening mammogram so we will need to cancel that and change to diagnostic bilaterally.  2. Class 1 obesity due to excess calories without serious comorbidity with body mass index (BMI) of 32.0 to 32.9 in adult [facility].  Has followed up as directed and they have tried multiple things to help with her eating behaviors as well as medications that may be beneficial.  They placed her on Victoza for 1 month and she was thrilled to lose 12 pounds however the cost of the medication is outside of her financial capability and she will continue it.  Since stopping that she has regained 5 pounds.  She had questions for [facility] provider [facility] regarding the use of Contrave.  She is already on Wellbutrin 300 mg daily and she was advised that we could not use Contrave without stopping the Wellbutrin.  Today has questions regarding that and if there is anything we can do in  the time that would possibly help out.  She is currently managed by Dr. Oneal Grout with pain management treated with narcotic medications.  Reviewed that the naltrexone component of Contrave would negate the effects of the medications that she is on.  Discussed her eating behaviors as she tends to last all day and then needs starting in the evening going into nighttime and until bedtime.  Wonders if her Wellbutrin dose taken in the morning is affecting her daytime appetite however it wears off by night.  She is in bed in making some change to see if it will help.  She sees psychiatry so do not want a plan with her medications too much however we discussed the options and she would like to try Wellbutrin 150 mg in the morning with a 300 mg dose in the evening around dinnertime to see if this helps curb her nighttime eating but improves her daytime eating.  We also discussed behavior modifications eating setting timers for mealtimes throughout the day, meal prepping or making healthy snacks that she can have on the go to help avoid junk food.  She will be starting to work with a nutritionist soon which I am sure will help with meal planning and substitution for healthier options.  Plan to follow-up in 4 weeks to evaluate response twice a dosing of the Wellbutrin.  3. Adjustment Disorder with Mixed Anxiety and Depressed Mood  Has had difficulty with psychiatry.  She is on fluoxetine as well as Pristiq and Wellbutrin.  She has had 2 canceled appointments with her psychiatrist and has one scheduled for next week.  She has been anxious that that 1 week as well.  Notes that she has been discussed reducing the doses of medications or the number of medications if at all possible however her answer has been to talk about it when she returns in 6 months.  Advised her to keep the upcoming appointment and expressed her concerns clearly with an expectation of making a plan for goals.  Patient verbalized understanding and is  agreeable to the plan.   Procedures performed this visit: None.  Return in about 4 weeks (around 02/13/2024) for weight/mood follow up.  __________________________________ Thayer Ohm, DNP, APRN, FNP-BC Primary Care and Sports Medicine [facility]

## 2024-01-17 ENCOUNTER — Ambulatory Visit: Payer: Medicare Other | Admitting: Neurology

## 2024-01-17 ENCOUNTER — Ambulatory Visit: Payer: Medicare Other | Admitting: Nurse Practitioner

## 2024-01-18 ENCOUNTER — Encounter: Payer: Self-pay | Admitting: Physical Therapy

## 2024-01-18 ENCOUNTER — Ambulatory Visit: Payer: Medicare Other | Admitting: Physical Therapy

## 2024-01-18 DIAGNOSIS — M25552 Pain in left hip: Secondary | ICD-10-CM

## 2024-01-18 DIAGNOSIS — M6281 Muscle weakness (generalized): Secondary | ICD-10-CM

## 2024-01-18 DIAGNOSIS — M25551 Pain in right hip: Secondary | ICD-10-CM | POA: Diagnosis not present

## 2024-01-18 NOTE — Therapy (Signed)
OUTPATIENT PHYSICAL THERAPY LOWER EXTREMITY TREATMENT   Patient Name: Heather Thompson MRN: [id number] DOB:[birth year], 73 y.o., female Today's Date: 01/18/2024  END OF SESSION:  PT End of Session - 01/18/24 1148     Visit Number 9    Number of Visits 16    Date for PT Re-Evaluation 02/06/24    Authorization Type Medicare    Authorization - Visit Number 9    Progress Note Due on Visit 10    PT Start Time 1100    PT Stop Time 1145    PT Time Calculation (min) 45 min    Activity Tolerance Patient tolerated treatment well    Behavior During Therapy [facility] for tasks assessed/performed                     Past Medical History:  Diagnosis Date   Abnormality of gait 04/23/2019   Acquired hypothyroidism 07/29/2021   Allergic rhinitis due to animal hair and dander 12/02/2015   Formatting of this note might be different from the original.  [facility]  Seen by Dr. Rockwell Germany 11/11/15     Plan- Received allergy shots today. Follow up 1 year.     Allergy    Anemia    Anemia    Anxiety    Arthritis of scaphoid-trapezium-trapezoid joint of right hand 09/10/2023   B12 deficiency    Back pain    Chronic fatigue 07/29/2021   Chronic fatigue syndrome    Chronic throat clearing 03/14/2023   Clotting disorder (HCC)    Constipation    Depression with anxiety 04/07/2009   Formatting of this note might be different from the original.  well controlled on current meds, follows with psychiatrist in [place]. Had a   h/o severe depression 5 years ago, and had ECT.     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  B12 deficiency   Iron deficiency     Herniated lumbar  intervertebral disc 11/12/2020   High cholesterol    History of blood clots    History of cervical spinal surgery 01/01/2018   Formatting of this note might be different from the original.  Fusion C6 and C7 per 2018 MRI report.     History of left hip replacement 01/14/2018   History of Roux-en-Y gastric bypass    Hoarseness 03/14/2023   Hyperlipidemia 08/19/2010   Hypertension    Hyponatremia 01/17/2018   Hypothyroidism    Hypothyroidism    IBS (irritable bowel syndrome) 04/06/2021   Impaired fasting glucose 08/19/2010   Incontinence 10/30/2013   Formatting of this note might be different from the original. Formatting of this note might be different from the original. Urinary Incontinence     Infertility, female    Insomnia 04/07/2009   Formatting of this note might be different from the original.  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  Followed by [facility]  Seen by Dr. Reginia Naas 03/01/15     S/p changing thoracic spinal cord stimulator generator 08/18/14.     Lumbar spine films show spinal cord generator in left flank area. 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  Follows with Rheumatology in [facility] of this note might be different from the original.  Myoadenylate deaminase deficiency, diagnosed back in 1984, after muscle   biopsies     Neutrophilic leukocytosis 08/12/2012   OAB  (overactive bladder) 08/28/2023   Obesity (BMI 30-39.9) 01/01/2018   Osteoarthritis    Osteomalacia 04/23/2019   Osteoporosis, senile 11/15/2010   Other myositis, left thigh 04/24/2023   Pain management contract agreement 11/17/2016   Formatting of this note might be different from the original.  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  Getting iron infusion via port at hematalogy/oncology center at [facility].   Was recently hospitalized in [facility] for severe anemia     Formatting of this note might be different from the original.  on OTC b12 currently     Vitamin D deficiency    Past Surgical History:  Procedure Laterality Date   ABDOMINAL HYSTERECTOMY     APPENDECTOMY     CARPAL TUNNEL RELEASE Bilateral 2023   with thumb joint replacement    CERVICAL FUSION     CHOLECYSTECTOMY     FOOT SURGERY Bilateral    fusion   GASTRIC BYPASS     SPINAL CORD STIMULATOR INSERTION  07/2023   TOTAL HIP ARTHROPLASTY Bilateral    11/2016 & 05/2017   Patient Active Problem List   Diagnosis Date Noted   Palpitations 10/04/2023   Cardiac murmur 10/04/2023   Allergy    Anemia    Anxiety    B12 deficiency    Back pain    Chronic fatigue syndrome    Clotting disorder (HCC)    Constipation    Edema of both lower extremities    Fatty liver  Fibromyalgia    Gallbladder problem    High cholesterol    History of blood clots    History of Roux-en-Y gastric bypass    Hypothyroidism    Infertility, female    Joint pain    Kidney stones    Muscle disorder    Osteoarthritis    Prediabetes    Salzmann nodular degeneration    SOBOE (shortness of breath on exertion)    Swallowing difficulty    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   Arthritis of scaphoid-trapezium-trapezoid joint of right hand 09/10/2023   Urge incontinence 08/28/2023   OAB (overactive bladder) 08/28/2023   Lumbar post-laminectomy syndrome 07/06/2023   Snapping hip syndrome, 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   Other myositis, left thigh 04/24/2023   Sacroiliitis, not elsewhere classified (HCC) 04/24/2023   Dysphagia 04/17/2023   Elevated liver enzymes 04/02/2023   Elevated TSH 04/02/2023   Chronic throat clearing 03/14/2023   Globus sensation 03/14/2023   Hoarseness 03/14/2023   Muscle tension dysphonia 03/12/2023   Unspecified inflammatory spondylopathy, 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   Primary osteoarthritis of both knees 12/21/2021   Port-A-Cath in place 07/29/2021   Acquired hypothyroidism 07/29/2021   Chronic fatigue 07/29/2021   Vitamin B12 deficiency 07/29/2021   Vitamin D deficiency 07/29/2021   Postmenopausal estrogen deficiency 07/29/2021   IBS (irritable bowel syndrome) 04/06/2021   Retention of urine 04/06/2021   Herniated lumbar intervertebral disc  11/12/2020   Skin sensation disturbance 11/12/2020   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   Spondylopathy, unspecified 11/12/2020   Trochanteric bursitis of both hips 11/19/2019   Low serum iron 04/23/2019   Osteomalacia 04/23/2019   Abnormality of gait 04/23/2019   History of bilateral hip arthroplasty 06/26/2018   Gastroesophageal reflux disease 06/18/2018   Hypertension 06/18/2018   Intertrigo 04/30/2018   Lentigines 04/30/2018   Hyponatremia 01/17/2018   History of left hip replacement 01/14/2018   History of cervical spinal surgery 01/01/2018   Obesity (BMI 30-39.9) 01/01/2018   SK (seborrheic keratosis) 04/18/2017   Pain management contract agreement 11/17/2016   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   Allergic rhinitis due to animal hair and dander 12/02/2015   Low back pain 03/03/2015   Incontinence 10/30/2013   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   Neutrophilic leukocytosis 08/12/2012   Thrombocytosis 08/12/2012   Osteoporosis, senile 11/15/2010   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   Hyperlipidemia 08/19/2010   Impaired fasting glucose 08/19/2010   Polyarthritis 09/28/2009   Degenerative disc disease 04/07/2009   Depression with anxiety 04/07/2009   Myopathy 04/07/2009   Insomnia 04/07/2009   Migraine 04/07/2009   Scoliosis 04/07/2009   Seasonal allergies 04/07/2009   Vitamin B12 deficiency anemia 04/07/2009    PCP: Christen Butter  REFERRING PROVIDER: Verdell Carmine DIAG: bilateral trochanteric bursitis  THERAPY DIAG:  Bilateral hip pain  Muscle weakness (generalized)  Rationale for Evaluation and Treatment: Rehabilitation  ONSET DATE: 04/2023  SUBJECTIVE:   SUBJECTIVE STATEMENT: Pt is states her legs are "a smidge not as bad". She is able to tolerate light pressure on her LEs without "too much pain".  Her neck is really bothering her and she has no answers from the MD.  PERTINENT HISTORY: Bilat THA 2018, fibromyalgia, OA, spinal cord stimulator, bilat TC  joint fusion  Pt had bilateral hip replacements in 2018. Pt began having Lt hip and groin pain last summer that was so severe she went to ED. She was eventually diagnosed with bursitis. Since that time the pain has continued and is "always there". She now  has bilateral hip pain, groin pain. Hips are tender to touch. Pain increases with laying on her sides, pain with sit to stand, prolonged standing and walking. Pt has not found a way to relieve pain PAIN:  Are you having pain? Yes: NPRS scale: 6/10 currently, 8-9/10 at worst Pain location: bilat hips Lt > Rt Pain description: sore, tender Aggravating factors: sitting in recliner, laying on sides, sit to stand Relieving factors: unknown  PRECAUTIONS: pt has spinal cord stimulator  RED FLAGS: None   WEIGHT BEARING RESTRICTIONS: No  FALLS:  Has patient fallen in last 6 months? No  OCCUPATION: retired  PLOF: Independent  PATIENT GOALS: reduce pain  NEXT MD VISIT: February 2025  OBJECTIVE:  Note: Objective measures were completed at Evaluation unless otherwise noted.  DIAGNOSTIC FINDINGS: x ray performed but not read at time as eval  PATIENT SURVEYS:  FOTO 39   MUSCLE LENGTH: Hamstrings: decreased bilat Hip flexors: decreased bilat   PALPATION: TTP bilat hip flexors, ITB, glutes, HS Increased mm spasticity bilat hamstrings, glutes, quads  LOWER EXTREMITY ROM:  Active ROM Right eval Left eval  Hip flexion    Hip extension    Hip abduction    Hip adduction    Hip internal rotation    Hip external rotation 30 50  Knee flexion    Knee extension    Ankle dorsiflexion    Ankle plantarflexion    Ankle inversion    Ankle eversion     (Blank rows = not tested)  LOWER EXTREMITY MMT:  MMT Right eval Left eval  Hip flexion 3+ 3  Hip extension 3 3  Hip abduction 3 3  Hip adduction    Hip internal rotation    Hip external rotation 3 3+  Knee flexion    Knee extension    Ankle dorsiflexion    Ankle  plantarflexion    Ankle inversion    Ankle eversion     (Blank rows = not tested)   FUNCTIONAL TESTS:  5 times sit to stand: 18.85 seconds   [facility] Adult PT Treatment:                                                DATE: 01/18/24 Therapeutic Exercise/Activity: Sidestep green TB Hip ext green TB 2 x 10 Side step up with hip abd 6'' step x 10 bilat Hip add ball squeeze x 15 Attempted seated hip IR - limited by pain Green TB pull down (shld ext) with slow march Tandem stance with green TB row Tandem stance on airex with trunk rotation 2 x 30 sec STS without UE support x 12   [facility] Adult PT Treatment:                                                DATE: 01/11/24 Therapeutic Exercise/Activity: Side step green TB Standing hip ext with knee flexion x 10 bilat Standing hip abd with knee flexion x 10 bilat STS without UE support x 12 Green TB pull down (shld ext) with slow march Tandem stance with green TB row Side step up with hip abd 4'' step x 8 bilat Tandem stance on airex with trunk rotation 2 x 30 sec bilat Seated  Hip flexor stretch bilat 2 x 30 sec FOTO 33   [facility] Adult PT Treatment:                                                DATE: 01/08/24 Therapeutic Exercise/Activity: 5 x STS 18.59 seconds Standing hip IR ball on wall x 10 bilat -- increased pain in quad Attempted seated IR with ball between knees - still increased pain Captain morgan x 10 bilat -- increased pain in Rt quad Green TB pull down (shld ext) with slow march Tandem stance with green TB row Pallof green TB x 12 bilat Attempted isometric dead bug with physioball - limited by increased pain in quads today Seated hip flexor stretch bilat Education on updated HEP - rationale for treatment                                                                                                                              PATIENT EDUCATION:  Education details: PT POC and goals, HEP Person educated: Patient Education  method: Explanation, Demonstration, and Handouts Education comprehension: verbalized understanding and returned demonstration  HOME EXERCISE PROGRAM: Access Code: [email or web address] URL: [email or web address] Date: 01/08/2024 Prepared by: Reggy Eye  Exercises - Sit to Stand  - 1 x daily - 7 x weekly - 2 sets - 10 reps - Seated Hamstring Stretch  - 1 x daily - 7 x weekly - 1 sets - 3 reps - 20-30 seconds hold - Seated Figure 4 Piriformis Stretch  - 1 x daily - 7 x weekly - 1 sets - 3 reps - 20-30 seconds hold - Standing Hip Abduction with Bent Knee  - 1 x daily - 7 x weekly - 3 sets - 10 reps - Standing Hip Extension with Leg Bent and Support  - 1 x daily - 7 x weekly - 3 sets - 10 reps - Single Leg 3 Way Reach with Support  - 1 x daily - 7 x weekly - 3 sets - 10 reps - Seated Hip Internal Rotation with Ball and Resistance  - 1 x daily - 7 x weekly - 3 sets - 10 reps - Seated Hip Flexor Stretch  - 1 x daily - 7 x weekly - 1 sets - 3 reps - 30 seconds hold - Resistance Pulldown with March  - 1 x daily - 7 x weekly - 3 sets - 10 reps - Standing Bilateral Low Shoulder Row with Anchored Resistance  - 1 x daily - 7 x weekly - 3 sets - 10 reps - Standing Anti-Rotation Press with Anchored Resistance  - 1 x daily - 7 x weekly - 3 sets - 10 reps  ASSESSMENT:  CLINICAL IMPRESSION: Pt with good tolerance to all exercises today. Continued to incorporate strength and balance to improve functional activity tolerance. Pt  is progressing slowly towards goals  OBJECTIVE IMPAIRMENTS: decreased activity tolerance, decreased mobility, difficulty walking, decreased ROM, decreased strength, increased muscle spasms, impaired flexibility, and pain.     GOALS: Goals reviewed with patient? Yes  SHORT TERM GOALS: Target date: 01/09/2024   Pt will be independent with initial HEP Baseline: Goal status: MET  2.  Pt will improve FOTO to > = 45 to demo improving functional mobility Baseline: 33 on  01/11/24 Goal status: IN PROGRESS   LONG TERM GOALS: Target date: 02/06/2024    Pt will be independent with advanced HEP and plan for continued exercise in community Baseline:  Goal status: INITIAL  2.  Pt will improve FOTO to >= 50 to demo improved functional mobility Baseline:  Goal status: INITIAL  3.  Pt will improve hip strength to 4+/5 bilat to improve standing and walking tolerance Baseline:  Goal status: INITIAL  4.  Pt will tolerate laying on both sides with pain <= 2/10 Baseline:  Goal status: INITIAL  5.  Pt will improve 5 x STS to <= 15 seconds Baseline:  Goal status: INITIAL    PLAN:  PT FREQUENCY: 2x/week  PT DURATION: 8 weeks  PLANNED INTERVENTIONS: [id number]- PT Re-evaluation, 97110-Therapeutic exercises, 97530- Therapeutic activity, 97112- Neuromuscular re-education, 97535- Self Care, [id number]- Manual therapy, [id number]- Aquatic Therapy, 97035- Ultrasound, [id number]- Ionotophoresis 4mg /ml Dexamethasone, Patient/Family education, Balance training, Taping, Dry Needling, Cryotherapy, and Moist heat  PLAN FOR NEXT SESSION: balance, hip stability, hip strengthening   Shanaiya Bene, PT 01/18/2024, 11:49 AM

## 2024-01-21 ENCOUNTER — Other Ambulatory Visit (HOSPITAL_COMMUNITY): Payer: Self-pay | Admitting: Psychiatry

## 2024-01-21 DIAGNOSIS — F322 Major depressive disorder, single episode, severe without psychotic features: Secondary | ICD-10-CM

## 2024-01-22 ENCOUNTER — Encounter: Payer: Self-pay | Admitting: Neurology

## 2024-01-22 ENCOUNTER — Ambulatory Visit (INDEPENDENT_AMBULATORY_CARE_PROVIDER_SITE_OTHER): Payer: Medicare Other | Admitting: Neurology

## 2024-01-22 ENCOUNTER — Encounter (HOSPITAL_COMMUNITY): Payer: Self-pay | Admitting: Psychiatry

## 2024-01-22 ENCOUNTER — Ambulatory Visit (INDEPENDENT_AMBULATORY_CARE_PROVIDER_SITE_OTHER): Payer: Medicare Other | Admitting: Psychiatry

## 2024-01-22 VITALS — BP 133/80 | HR 78 | Ht 64.0 in | Wt 196.0 lb

## 2024-01-22 VITALS — Ht 64.0 in | Wt 196.0 lb

## 2024-01-22 DIAGNOSIS — F332 Major depressive disorder, recurrent severe without psychotic features: Secondary | ICD-10-CM

## 2024-01-22 DIAGNOSIS — F411 Generalized anxiety disorder: Secondary | ICD-10-CM

## 2024-01-22 DIAGNOSIS — F5102 Adjustment insomnia: Secondary | ICD-10-CM | POA: Diagnosis not present

## 2024-01-22 DIAGNOSIS — H8111 Benign paroxysmal vertigo, right ear: Secondary | ICD-10-CM

## 2024-01-22 DIAGNOSIS — F322 Major depressive disorder, single episode, severe without psychotic features: Secondary | ICD-10-CM

## 2024-01-22 MED ORDER — DESVENLAFAXINE SUCCINATE ER 100 MG PO TB24: 100.0000 mg | ORAL_TABLET | Freq: Every day | ORAL | 1 refills | Status: DC

## 2024-01-22 MED ORDER — FLUOXETINE HCL 10 MG PO CAPS: 10.0000 mg | ORAL_CAPSULE | Freq: Every day | ORAL | 0 refills | Status: DC

## 2024-01-22 NOTE — Progress Notes (Signed)
 [facility]  PATIENT: Heather Thompson DOB: 19-Jan-1951  REQUESTING CLINICIAN: Christen Butter, NP HISTORY FROM: Patient  REASON FOR VISIT: Ongoing dizziness    HISTORICAL  CHIEF COMPLAINT:  Chief Complaint  Patient presents with   Room 13    Pt is here Alone. Pt states her dizzy spell happens at different times. Pt states that when she stands up from sitting she will get dizzy. Pt states that standing still she will get dizzy, she states that she feels like she is going to the right.     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  Patient describes dizziness as room spinning sensation, lasting less than 5 seconds.  Every time that she stands up, she has to wait for a minute before walking.  While walking she tends to veer to the right side.  She has been bumping into things on the right side.  Again dizziness occurs with head movement, standing up, but they are very short-lived, less than 5 seconds, no fall associated.  No nausea, no vomiting associated.  She has seen cardiology, had a cardiac monitor and was told everything is normal.  She is currently getting physical therapy for pain.   OTHER MEDICAL CONDITIONS: Fibromyalgia, Osteroarthritis, muscle disease (enzyme deficiency), Depression   REVIEW OF SYSTEMS: Full 14 system review of systems performed and negative with exception of: As noted in the HPI   ALLERGIES: Allergies  Allergen Reactions   Compazine [Prochlorperazine] Anaphylaxis   Phenothiazines Anaphylaxis and Swelling    Caused her throat to close     Prednisone Itching, Swelling and Rash    Facial swelling   Pregabalin Itching and Swelling    Swelling and itching of hands and feet.       HOME MEDICATIONS: Outpatient Medications Prior to Visit   Medication Sig Dispense Refill   buPROPion (WELLBUTRIN XL) 150 MG 24 hr tablet Take 1 tablet (150 mg total) by mouth daily. Take in the morning. 90 tablet 3   buPROPion (WELLBUTRIN XL) 300 MG 24 hr tablet Take 1 tablet (300 mg total) by mouth daily. Take in the evening 90 tablet 0   celecoxib (CELEBREX) 200 MG capsule One to 2 tablets by mouth daily as needed for pain. 60 capsule 2   desvenlafaxine (PRISTIQ) 100 MG 24 hr tablet TAKE ONE TABLET BY MOUTH EVERY DAY 30 tablet 1   FLUoxetine (PROZAC) 10 MG capsule TAKE ONE CAPSULE BY MOUTH EVERY DAY 90 capsule 0   gabapentin (NEURONTIN) 300 MG capsule One tab PO qHS for a week, then BID for a week, then TID. May double weekly to a max of 3,600mg /day 90 capsule 3   hydrOXYzine (ATARAX) 10 MG tablet Take 1 tablet (10 mg total) by mouth at bedtime. 90 tablet 0   Magnesium 250 MG TABS Take 1 tablet by mouth daily.     Multiple Vitamin (MULTI-VITAMIN) tablet Take 1 tablet by mouth daily.     nystatin (MYCOSTATIN/NYSTOP) powder APPLY TO THE AFFECTED AREA(S) EXTERNALLY TWICE DAILY 30 g 0   propranolol (INDERAL) 20 MG tablet TAKE 1 TABLET(20 MG) BY MOUTH DAILY AS NEEDED 90 tablet 0   solifenacin (VESICARE) 5 MG tablet Take 1 tablet (5 mg total) by mouth daily. 90 tablet 3   temazepam (RESTORIL) 22.5 MG capsule TAKE ONE CAPSULE BY MOUTH AT NIGHT  30 capsule 3   tiZANidine (ZANAFLEX) 2 MG tablet TAKE 1 TABLET (2 MG TOTAL) BY MOUTH IN THE MORNING AND AT BEDTIME. 180 tablet 1   lisinopril (ZESTRIL) 10 MG tablet Take 1 tablet (10 mg total) by mouth daily. 90 tablet 3   No facility-administered medications prior to visit.    PAST MEDICAL HISTORY: Past Medical History:  Diagnosis Date   Abnormality of gait 04/23/2019   Acquired hypothyroidism 07/29/2021   Allergic rhinitis due to animal hair and dander 12/02/2015   Formatting of this note might be different from the original.  [facility]  Seen by Dr. Rockwell Germany 11/11/15     Plan-  Received allergy shots today. Follow up 1 year.     Allergy    Anemia    Anemia    Anxiety    Arthritis of scaphoid-trapezium-trapezoid joint of right hand 09/10/2023   B12 deficiency    Back pain    Chronic fatigue 07/29/2021   Chronic fatigue syndrome    Chronic throat clearing 03/14/2023   Clotting disorder (HCC)    Constipation    Depression with anxiety 04/07/2009   Formatting of this note might be different from the original.  well controlled on current meds, follows with psychiatrist in [place]. Had a   h/o severe depression 5 years ago, and had ECT.     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  B12 deficiency   Iron deficiency     Herniated lumbar intervertebral disc 11/12/2020   High cholesterol    History of blood clots    History of cervical spinal surgery 01/01/2018   Formatting of this note might be different from the original.  Fusion C6 and C7 per 2018 MRI report.     History of left hip replacement 01/14/2018   History of Roux-en-Y gastric bypass    Hoarseness 03/14/2023   Hyperlipidemia 08/19/2010   Hypertension    Hyponatremia 01/17/2018   Hypothyroidism    Hypothyroidism    IBS (irritable bowel syndrome) 04/06/2021   Impaired fasting glucose 08/19/2010   Incontinence 10/30/2013   Formatting of this note might be different from the original. Formatting of this note might be different from the original. Urinary Incontinence     Infertility, female    Insomnia 04/07/2009   Formatting of this note might be different from the original.  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  Followed by [facility]  Seen by Dr. Reginia Naas 03/01/15     S/p changing thoracic spinal cord stimulator generator 08/18/14.     Lumbar spine films show spinal cord generator in left flank area. Lumbar spine has multilevel degenerative spondylosis with disc space narrowing and some asymmetric disc where leading to slight thi   Low serum iron 04/23/2019   Lumbar post-laminectomy syndrome 07/06/2023   Migraine 04/07/2009   Muscle disorder    Muscle disorder  Muscle tension dysphonia 03/12/2023   Myoadenylate deaminase deficiency (HCC)    Myopathy 04/07/2009   Formatting of this note might be different from the original.  Follows with Rheumatology in [facility] of this note might be different from the original.  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  Getting iron infusion via port at hematalogy/oncology center at [facility].   Was recently hospitalized in [facility] for severe anemia     Formatting of this note might be different from the original.  on OTC b12 currently     Vitamin D deficiency     PAST SURGICAL HISTORY: Past Surgical History:  Procedure Laterality Date   ABDOMINAL HYSTERECTOMY     APPENDECTOMY     CARPAL TUNNEL RELEASE Bilateral 2023   with thumb joint replacement   CERVICAL FUSION     CHOLECYSTECTOMY     FOOT SURGERY Bilateral    fusion   GASTRIC BYPASS     SPINAL CORD STIMULATOR INSERTION  07/2023   TOTAL HIP ARTHROPLASTY Bilateral    11/2016 & 05/2017    FAMILY HISTORY: Family History  Problem Relation Age of Onset   Hyperlipidemia Mother    Hypertension Mother    Heart attack Mother    Heart disease Mother    Sudden death Mother    Depression Mother    Anxiety disorder Mother    Obesity Mother    Anxiety disorder Father    Depression Father    Cancer Father    Hyperlipidemia Father    Hypertension Father    Lung cancer Father    Prostate cancer Father    Kidney cancer Brother    Healthy Daughter    Healthy Son     SOCIAL HISTORY: Social History   Socioeconomic History   Marital status: Widowed    Spouse name: Not on file   Number of children: 2   Years of education: 33  Highest education level: 12th grade  Occupational History     Comment: Retired.   Occupation: Retired  Tobacco Use   Smoking status: Never    Passive exposure: Past   Smokeless tobacco: Never  Vaping Use   Vaping status: Never Used  Substance and Sexual Activity   Alcohol use: Yes    Comment: rarely   Drug use: Never   Sexual activity: Not Currently  Other Topics Concern   Not on file  Social History Narrative   Lives alone. She has two children and 4 grandchildren. She stays active, enjoys doing crafts and painting.    Social Drivers of Corporate investment banker Strain: Low Risk  (08/27/2023)   Received from [facility]   Overall Financial Resource Strain (CARDIA)    Difficulty of Paying Living Expenses: Not hard at all  Food Insecurity: No Food Insecurity (08/27/2023)   Received from [facility]   Hunger Vital Sign    Worried About Running Out of Food in the Last Year: Never true    Ran Out of Food in the Last Year: Never true  Transportation Needs: No Transportation Needs (08/27/2023)   Received from [facility] - Transportation    Lack of Transportation (Medical): No    Lack of Transportation (Non-Medical): No  Physical Activity: Inactive (01/09/2022)   Exercise Vital Sign    Days of Exercise per Week: 0 days    Minutes of Exercise per Session: 0 min  Stress: No Stress Concern Present (08/17/2023)   Received from [facility] - Occupational Stress Questionnaire    Feeling of Stress : Not at all  Social Connections: Unknown (03/26/2023)   Received from [facility]   Social Network    Social Network: Not on file  Intimate Partner Violence: Not At Risk (12/14/2023)   Received from [facility]   HITS    Over the last 12 months how often did your partner physically hurt you?: Never    Over the last 12 months how often did your partner insult you or talk down to you?: Never    Over the last 12 months how often did your partner threaten you with physical  harm?: Never    Over the last 12 months how often did your partner scream or curse at you?: Never    PHYSICAL EXAM  GENERAL EXAM/CONSTITUTIONAL: Vitals:  Vitals:   01/22/24 1040  Weight: 196 lb (88.9 kg)  Height: 5\' 4"  (1.626 m)   Body mass index is 33.64 kg/m. Wt Readings from Last 3 Encounters:  01/22/24 196 lb (88.9 kg)  01/16/24 189 lb (85.7 kg)  01/15/24 189 lb (85.7 kg)   Patient is in no distress; well developed, nourished and groomed; neck is supple  MUSCULOSKELETAL: Gait, strength, tone, movements noted in Neurologic exam below  NEUROLOGIC: MENTAL STATUS:      No data to display         awake, alert, oriented to person, place and time recent and remote memory intact normal attention and concentration language fluent, comprehension intact, naming intact fund of knowledge appropriate  CRANIAL NERVE:  2nd, 3rd, 4th, 6th - Visual fields full to confrontation, extraocular muscles intact, no nystagmus 5th - facial sensation symmetric 7th - facial strength symmetric 8th - hearing intact 9th - palate elevates symmetrically, uvula midline 11th - shoulder shrug symmetric 12th - tongue protrusion midline  MOTOR:  normal bulk and tone, full strength  in the BUE, BLE  SENSORY:  normal and symmetric to light touch  COORDINATION:  finger-nose-finger, fine finger movements normal  GAIT/STATION:  Wide based, unable to tandem      DIAGNOSTIC DATA (LABS, IMAGING, TESTING) - I reviewed patient records, labs, notes, testing and imaging myself where available.  Lab Results  Component Value Date   WBC 5.6 01/07/2024   HGB 11.5 (L) 01/07/2024   HCT 35.9 (L) 01/07/2024   MCV 93.2 01/07/2024   PLT 242 01/07/2024      Component Value Date/Time   NA 136 08/14/2023 1357   K 4.8 08/14/2023 1357   CL 95 (L) 08/14/2023 1357   CO2 26 08/14/2023 1357   GLUCOSE 101 (H) 08/14/2023 1357   GLUCOSE 92 02/20/2023 1128   BUN 17 08/14/2023 1357   CREATININE 0.68  08/14/2023 1357   CREATININE 0.80 02/20/2023 1128   CALCIUM 9.6 08/14/2023 1357   PROT 6.9 08/14/2023 1357   ALBUMIN 4.2 08/14/2023 1357   AST 30 08/14/2023 1357   AST 30 09/07/2021 1433   ALT 29 08/14/2023 1357   ALT 31 09/07/2021 1433   ALKPHOS 107 08/14/2023 1357   BILITOT 0.4 08/14/2023 1357   BILITOT 0.3 09/07/2021 1433   GFRNONAA >60 09/07/2021 1433   Lab Results  Component Value Date   CHOL 213 (H) 02/20/2023   HDL 89 02/20/2023   LDLCALC 104 (H) 02/20/2023   TRIG 106 02/20/2023   CHOLHDL 2.4 02/20/2023   Lab Results  Component Value Date   HGBA1C 6.1 (A) 08/14/2023   HGBA1C 6.1 08/14/2023   Lab Results  Component Value Date   VITAMINB12 >2000 (H) 09/28/2023   Lab Results  Component Value Date   TSH 4.550 (H) 03/27/2023      ASSESSMENT AND PLAN  73 y.o. year old female with fibromyalgia, osteoarthritis, depression, chronic pain following spinal cord stimulator who is presenting with dizziness for the past couple years described as room spinning sensation lasting less than 5 seconds.  Based on description, this is most consistent with benign paroxysmal positional vertigo but the duration (ongoing for years) is longer than expected.  Will obtain MRI brain to rule out structural abnormality and if normal patient will need vestibular therapy.  I will send her to the [facility] rehab where she is getting PT for low back pain.  Continue to follow with PCP and return as needed.   1. Benign paroxysmal positional vertigo of right ear      Patient Instructions  MRI Brain to rule out structural abnormality causing ongoing dizziness  Referral to vestibular therapy. Patient is already going to the [facility] rehab and would like to have Vestibular therapy there  Return as needed   Orders Placed This Encounter  Procedures   MR BRAIN WO CONTRAST   Ambulatory referral to Physical Therapy    No orders of the defined types were placed in this encounter.   Return  if symptoms worsen or fail to improve.    Windell Norfolk, MD 01/22/2024, 11:52 AM  [facility] [address] [place], [place] [address] [phone]

## 2024-01-22 NOTE — Patient Instructions (Signed)
 MRI Brain to rule out structural abnormality causing ongoing dizziness  Referral to vestibular therapy. Patient is already going to the [facility] rehab and would like to have Vestibular therapy there  Return as needed
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
## 2024-01-23 ENCOUNTER — Ambulatory Visit (INDEPENDENT_AMBULATORY_CARE_PROVIDER_SITE_OTHER): Payer: Medicare Other | Admitting: Sports Medicine

## 2024-01-23 ENCOUNTER — Encounter: Payer: Self-pay | Admitting: Sports Medicine

## 2024-01-23 ENCOUNTER — Ambulatory Visit: Payer: Medicare Other | Admitting: Physical Therapy

## 2024-01-23 DIAGNOSIS — M961 Postlaminectomy syndrome, not elsewhere classified: Secondary | ICD-10-CM

## 2024-01-23 DIAGNOSIS — M6281 Muscle weakness (generalized): Secondary | ICD-10-CM

## 2024-01-23 DIAGNOSIS — M7061 Trochanteric bursitis, right hip: Secondary | ICD-10-CM

## 2024-01-23 DIAGNOSIS — M7062 Trochanteric bursitis, left hip: Secondary | ICD-10-CM

## 2024-01-23 DIAGNOSIS — R42 Dizziness and giddiness: Secondary | ICD-10-CM

## 2024-01-23 DIAGNOSIS — M25551 Pain in right hip: Secondary | ICD-10-CM | POA: Diagnosis not present

## 2024-01-23 DIAGNOSIS — Z96643 Presence of artificial hip joint, bilateral: Secondary | ICD-10-CM

## 2024-01-23 MED ORDER — GABAPENTIN 600 MG PO TABS: 600.0000 mg | ORAL_TABLET | Freq: Three times a day (TID) | ORAL | 3 refills | Status: DC

## 2024-01-23 NOTE — Progress Notes (Signed)
    Procedures performed today:    None.  Independent interpretation of notes and tests performed by another provider:   None.  Brief History, Exam, Impression, and Recommendations:    History of bilateral hip arthroplasty Heather Thompson returns, she is a pleasant 73 year old female, she has chronic bilateral hip pain, initially lateral, she also had some anterior pain. She has had bilateral hip arthroplasties. I did get x-rays at the last visit that did not show any signs of aseptic loosening. She has done some physical therapy and has had some improvement in her lateral hip pain but not the anterior pain. Though this could be referred from her back I would like to complete the workup for her hip joints with a three-phase bone scan. If this is negative then I would suspect that her anterior hip and thigh pain is coming from her back rather than the replacements.  Lumbar post-laminectomy syndrome Heather Thompson also has history of lumbar spinal stenosis status post lumbar laminectomy, she has been working with Dr. Laurian Brim and has a spinal cord stimulator, we added some physical therapy and gabapentin. She was already taking an SNRI. She has improved significantly with gabapentin and Celebrex, increasing gabapentin to 600 mg 3 times daily, continue Celebrex, she can continue PT and follow-up with Dr. Laurian Brim.  Trochanteric bursitis of both hips Trochanteric bursitis pain has improved/resolved with physical therapy.    ____________________________________________ Ihor Austin. Benjamin Stain, M.D., ABFM., CAQSM., AME. Primary Care and Sports Medicine [facility]  Adjunct Professor of Family Medicine  [place] [employer]  Restaurant manager, fast food

## 2024-01-23 NOTE — Assessment & Plan Note (Signed)
 Heather Thompson also has history of lumbar spinal stenosis status post lumbar laminectomy, she has been working with Dr. Laurian Brim and has a spinal cord stimulator, we added some physical therapy and gabapentin. She was already taking an SNRI. She has improved significantly with gabapentin and Celebrex, increasing gabapentin to 600 mg 3 times daily, continue Celebrex, she can continue PT and follow-up with Dr. Laurian Brim.

## 2024-01-23 NOTE — Therapy (Signed)
 OUTPATIENT PHYSICAL THERAPY LOWER EXTREMITY and VESTIBULAR REEVALUATION   Patient Name: Heather Thompson MRN: [id number] DOB:November 09, 1951, 73 y.o., female Today's Date: 01/23/2024  END OF SESSION:  PT End of Session - 01/23/24 1058     Visit Number 10    Number of Visits 26    Date for PT Re-Evaluation 03/19/24    Authorization Type Medicare    Authorization - Visit Number 10    Progress Note Due on Visit 20    PT Start Time 1018    PT Stop Time 1055    PT Time Calculation (min) 37 min    Activity Tolerance Patient tolerated treatment well    Behavior During Therapy WFL for tasks assessed/performed                      Past Medical History:  Diagnosis Date   Abnormality of gait 04/23/2019   Acquired hypothyroidism 07/29/2021   Allergic rhinitis due to animal hair and dander 12/02/2015   Formatting of this note might be different from the original.  [facility]  Seen by Dr. Rockwell Germany 11/11/15     Plan- Received allergy shots today. Follow up 1 year.     Allergy    Anemia    Anemia    Anxiety    Arthritis of scaphoid-trapezium-trapezoid joint of right hand 09/10/2023   B12 deficiency    Back pain    Chronic fatigue 07/29/2021   Chronic fatigue syndrome    Chronic throat clearing 03/14/2023   Clotting disorder (HCC)    Constipation    Depression with anxiety 04/07/2009   Formatting of this note might be different from the original.  well controlled on current meds, follows with psychiatrist in [place]. Had a   h/o severe depression 5 years ago, and had ECT.     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  B12 deficiency   Iron deficiency      Herniated lumbar intervertebral disc 11/12/2020   High cholesterol    History of blood clots    History of cervical spinal surgery 01/01/2018   Formatting of this note might be different from the original.  Fusion C6 and C7 per 2018 MRI report.     History of left hip replacement 01/14/2018   History of Roux-en-Y gastric bypass    Hoarseness 03/14/2023   Hyperlipidemia 08/19/2010   Hypertension    Hyponatremia 01/17/2018   Hypothyroidism    Hypothyroidism    IBS (irritable bowel syndrome) 04/06/2021   Impaired fasting glucose 08/19/2010   Incontinence 10/30/2013   Formatting of this note might be different from the original. Formatting of this note might be different from the original. Urinary Incontinence     Infertility, female    Insomnia 04/07/2009   Formatting of this note might be different from the original.  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  Followed by [facility]  Seen by Dr. Reginia Naas 03/01/15     S/p changing thoracic spinal cord stimulator generator 08/18/14.     Lumbar spine films show spinal cord generator in left flank area. 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  Follows with Rheumatology in [facility] of this note might be different from the original.  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  Getting iron infusion via port at hematalogy/oncology center at [facility].   Was recently hospitalized in [facility] for severe anemia     Formatting of this note might be different from the original.  on OTC b12 currently     Vitamin D deficiency    Past Surgical History:  Procedure Laterality Date   ABDOMINAL HYSTERECTOMY     APPENDECTOMY     CARPAL TUNNEL RELEASE Bilateral 2023   with thumb  joint replacement   CERVICAL FUSION     CHOLECYSTECTOMY     FOOT SURGERY Bilateral    fusion   GASTRIC BYPASS     SPINAL CORD STIMULATOR INSERTION  07/2023   TOTAL HIP ARTHROPLASTY Bilateral    11/2016 & 05/2017   Patient Active Problem List   Diagnosis Date Noted   Palpitations 10/04/2023   Cardiac murmur 10/04/2023   Allergy    Anemia    Anxiety    B12 deficiency    Back pain    Chronic fatigue syndrome    Clotting disorder (HCC)    Constipation    Edema of both lower extremities    Fatty liver  Fibromyalgia    Gallbladder problem    High cholesterol    History of blood clots    History of Roux-en-Y gastric bypass    Hypothyroidism    Infertility, female    Joint pain    Kidney stones    Muscle disorder    Osteoarthritis    Prediabetes    Salzmann nodular degeneration    SOBOE (shortness of breath on exertion)    Swallowing difficulty    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   Arthritis of scaphoid-trapezium-trapezoid joint of right hand 09/10/2023   Urge incontinence 08/28/2023   OAB (overactive bladder) 08/28/2023   Lumbar post-laminectomy syndrome 07/06/2023   Snapping hip syndrome, 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   Other myositis, left thigh 04/24/2023   Sacroiliitis, not elsewhere classified (HCC) 04/24/2023   Dysphagia 04/17/2023   Elevated liver enzymes 04/02/2023   Elevated TSH 04/02/2023   Chronic throat clearing 03/14/2023   Globus sensation 03/14/2023   Hoarseness 03/14/2023   Muscle tension dysphonia 03/12/2023   Unspecified inflammatory spondylopathy, 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   Primary osteoarthritis of both knees 12/21/2021   Port-A-Cath in place 07/29/2021   Acquired hypothyroidism 07/29/2021   Chronic fatigue 07/29/2021   Vitamin B12 deficiency 07/29/2021   Vitamin D deficiency 07/29/2021   Postmenopausal estrogen deficiency 07/29/2021   IBS (irritable bowel syndrome) 04/06/2021   Retention of urine 04/06/2021   Herniated lumbar  intervertebral disc 11/12/2020   Skin sensation disturbance 11/12/2020   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   Spondylopathy, unspecified 11/12/2020   Trochanteric bursitis of both hips 11/19/2019   Low serum iron 04/23/2019   Osteomalacia 04/23/2019   Abnormality of gait 04/23/2019   History of bilateral hip arthroplasty 06/26/2018   Gastroesophageal reflux disease 06/18/2018   Hypertension 06/18/2018   Intertrigo 04/30/2018   Lentigines 04/30/2018   Hyponatremia 01/17/2018   History of left hip replacement 01/14/2018   History of cervical spinal surgery 01/01/2018   Obesity (BMI 30-39.9) 01/01/2018   SK (seborrheic keratosis) 04/18/2017   Pain management contract agreement 11/17/2016   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   Allergic rhinitis due to animal hair and dander 12/02/2015   Low back pain 03/03/2015   Incontinence 10/30/2013   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   Neutrophilic leukocytosis 08/12/2012   Thrombocytosis 08/12/2012   Osteoporosis, senile 11/15/2010   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   Hyperlipidemia 08/19/2010   Impaired fasting glucose 08/19/2010   Polyarthritis 09/28/2009   Degenerative disc disease 04/07/2009   Depression with anxiety 04/07/2009   Myopathy 04/07/2009   Insomnia 04/07/2009   Migraine 04/07/2009   Scoliosis 04/07/2009   Seasonal allergies 04/07/2009   Vitamin B12 deficiency anemia 04/07/2009    PCP: Christen Butter  REFERRING PROVIDER: Verdell Carmine DIAG: bilateral trochanteric bursitis, vertigo  THERAPY DIAG:  Bilateral hip pain  Muscle weakness (generalized)  Dizziness and giddiness  Rationale for Evaluation and Treatment: Rehabilitation  ONSET DATE: 04/2023  SUBJECTIVE:   SUBJECTIVE STATEMENT: Pt is states her legs are still not as sensitive to touch but she is having some soreness in her glutes. She presents today for vestibular evaluation.  Vestibular eval: pt states dizziness has been going on for "quite a while".  Pt states she feels "room is spinning" when she goes from sit to stand. She reports she also feels that sensation when she goes from supine to sit but it is not as severe. She also states she veers to the right while walking. She also states that if she is standing still she  will occasionally feel like she is "tipping over" to the right. The sensation only lasts a few seconds. Pt states she has had a cardiac work up which ruled out cardiac cause of dizziness  PERTINENT HISTORY: Bilat THA 2018, fibromyalgia, OA, spinal cord stimulator, bilat TC joint fusion  Pt had bilateral hip replacements in 2018. Pt began having Lt hip and groin pain last summer that was so severe she went to ED. She was eventually diagnosed with bursitis. Since that time the pain has continued and is "always there". She now has bilateral hip pain, groin pain. Hips are tender to touch. Pain increases with laying on her sides, pain with sit to stand, prolonged standing and walking. Pt has not found a way to relieve pain PAIN:  Are you having pain? Yes: NPRS scale: 6/10 currently, 8-9/10 at worst Pain location: bilat hips Lt > Rt Pain description: sore, tender Aggravating factors: sitting in recliner, laying on sides, sit to stand Relieving factors: unknown  PRECAUTIONS: pt has spinal cord stimulator  RED FLAGS: None   WEIGHT BEARING RESTRICTIONS: No  FALLS:  Has patient fallen in last 6 months? No  OCCUPATION: retired  PLOF: Independent  PATIENT GOALS: reduce pain  NEXT MD VISIT: February 2025  OBJECTIVE:  Note: Objective measures were completed at Evaluation unless otherwise noted.  DIAGNOSTIC FINDINGS: x ray performed but not read at time as eval  PATIENT SURVEYS:  FOTO 39   MUSCLE LENGTH: Hamstrings: decreased bilat Hip flexors: decreased bilat   PALPATION: TTP bilat hip flexors, ITB, glutes, HS Increased mm spasticity bilat hamstrings, glutes, quads  LOWER EXTREMITY ROM:  Active ROM  Right eval Left eval  Hip flexion    Hip extension    Hip abduction    Hip adduction    Hip internal rotation    Hip external rotation 30 50  Knee flexion    Knee extension    Ankle dorsiflexion    Ankle plantarflexion    Ankle inversion    Ankle eversion     (Blank rows = not tested)  LOWER EXTREMITY MMT:  MMT Right eval Left eval  Hip flexion 3+ 3  Hip extension 3 3  Hip abduction 3 3  Hip adduction    Hip internal rotation    Hip external rotation 3 3+  Knee flexion    Knee extension    Ankle dorsiflexion    Ankle plantarflexion    Ankle inversion    Ankle eversion     (Blank rows = not tested)   FUNCTIONAL TESTS:  5 times sit to stand: 18.85 seconds  VESTIBULAR ASSESSMENT:     SYMPTOM BEHAVIOR:  Subjective history: pt states dizziness has been going on for "quite a while". Pt states she feels "room is spinning" when she goes from sit to stand. She reports she also feels that sensation when she goes from supine to sit but it is not as severe. She also states she veers to the right while walking. She also states that if she is standing still she will occasionally feel like she is "tipping over" to the right. The sensation only lasts a few seconds.  Non-Vestibular symptoms:  none reported  Type of dizziness: Spinning/Vertigo  Frequency: multiple times a day  Duration: seconds  Aggravating factors: Induced by position change: supine to sit, sit to stand, and walking  Relieving factors: slow movements  Progression of symptoms: unchanged  OCULOMOTOR EXAM:  Ocular Alignment: normal  Ocular ROM: No Limitations  Spontaneous  Nystagmus: absent  Gaze-Induced Nystagmus: age appropriate nystagmus at end range  Smooth Pursuits: saccades and pt reports "spinning" sensation when focusing on pen  Saccades: intact     VESTIBULAR - OCULAR REFLEX:   Slow VOR: Positive Bilaterally increased spinning sensation  VOR Cancellation: Comment: increased spinning  sensation  Head-Impulse Test: HIT Right: positive HIT Left: negative     POSITIONAL TESTING: Right Dix-Hallpike: no nystagmus Left Dix-Hallpike: no nystagmus Right Roll Test: no nystagmus Left Roll Test: no nystagmus   MOTION SENSITIVITY:    Motion Sensitivity Quotient Intensity: 0 = none, 1 = Lightheaded, 2 = Mild, 3 = Moderate, 4 = Severe, 5 = Vomiting  Intensity  1. Sitting to supine   2. Supine to L side   3. Supine to R side   4. Supine to sitting   5. L Hallpike-Dix   6. Up from L    7. R Hallpike-Dix   8. Up from R    9. Sitting, head  tipped to L knee   10. Head up from L  knee   11. Sitting, head  tipped to R knee   12. Head up from R  knee   13. Sitting head turns x5   14.Sitting head nods x5   15. In stance, 180  turn to L    16. In stance, 180  turn to R       [facility] Adult PT Treatment:                                                DATE: 01/23/24  PT Re-evaluation to add vestibular diagnosis Neuromuscular re-ed: VOR x 1 horizontal and vertical seated 3 sets to tolerance Therapeutic Activity: Pt education on vestibular diagnosis and rationale for treatment   [facility] Adult PT Treatment:                                                DATE: 01/18/24 Therapeutic Exercise/Activity: Sidestep green TB Hip ext green TB 2 x 10 Side step up with hip abd 6'' step x 10 bilat Hip add ball squeeze x 15 Attempted seated hip IR - limited by pain Green TB pull down (shld ext) with slow march Tandem stance with green TB row Tandem stance on airex with trunk rotation 2 x 30 sec STS without UE support x 12   [facility] Adult PT Treatment:                                                DATE: 01/11/24 Therapeutic Exercise/Activity: Side step green TB Standing hip ext with knee flexion x 10 bilat Standing hip abd with knee flexion x 10 bilat STS without UE support x 12 Green TB pull down (shld ext) with slow march Tandem stance with green TB row Side step up with hip  abd 4'' step x 8 bilat Tandem stance on airex with trunk rotation 2 x 30 sec bilat Seated Hip flexor stretch bilat 2 x 30 sec FOTO 33  PATIENT EDUCATION:  Education details: PT POC and goals, HEP Person educated: Patient Education method: Explanation, Demonstration, and Handouts Education comprehension: verbalized understanding and returned demonstration  HOME EXERCISE PROGRAM: Access Code: [email or web address] URL: [email or web address] Date: 01/23/2024 Prepared by: Reggy Eye  Exercises - Sit to Stand  - 1 x daily - 7 x weekly - 2 sets - 10 reps - Seated Hamstring Stretch  - 1 x daily - 7 x weekly - 1 sets - 3 reps - 20-30 seconds hold - Seated Figure 4 Piriformis Stretch  - 1 x daily - 7 x weekly - 1 sets - 3 reps - 20-30 seconds hold - Standing Hip Abduction with Bent Knee  - 1 x daily - 7 x weekly - 3 sets - 10 reps - Standing Hip Extension with Leg Bent and Support  - 1 x daily - 7 x weekly - 3 sets - 10 reps - Single Leg 3 Way Reach with Support  - 1 x daily - 7 x weekly - 3 sets - 10 reps - Seated Hip Internal Rotation with Ball and Resistance  - 1 x daily - 7 x weekly - 3 sets - 10 reps - Seated Hip Flexor Stretch  - 1 x daily - 7 x weekly - 1 sets - 3 reps - 30 seconds hold - Resistance Pulldown with March  - 1 x daily - 7 x weekly - 3 sets - 10 reps - Standing Bilateral Low Shoulder Row with Anchored Resistance  - 1 x daily - 7 x weekly - 3 sets - 10 reps - Standing Anti-Rotation Press with Anchored Resistance  - 1 x daily - 7 x weekly - 3 sets - 10 reps - Seated Gaze Stabilization with Head Nod  - 3 x daily - 7 x weekly - 3 sets - 10 reps - Seated Gaze Stabilization with Head Rotation  - 3 x daily - 7 x weekly - 3 sets - 10 reps  ASSESSMENT:  CLINICAL IMPRESSION: Pt assessed for vestibular symptoms. Pt presents with decreased tolerance to  VOR, position changes and impaired gait and balance. Pt will benefit from skilled PT to address vestibular deficits as well as to continue hip strengthening and improve balance and functional mobility  OBJECTIVE IMPAIRMENTS: decreased activity tolerance, decreased mobility, difficulty walking, decreased ROM, decreased strength, increased muscle spasms, impaired flexibility, and pain.     GOALS: Goals reviewed with patient? Yes  SHORT TERM GOALS: Target date: 01/09/2024   Pt will be independent with initial HEP Baseline: Goal status: MET  2.  Pt will improve FOTO to > = 45 to demo improving functional mobility Baseline: 33 on 01/11/24 Goal status: IN PROGRESS   LONG TERM GOALS: Target date: 03/19/2024      Pt will be independent with advanced HEP and plan for continued exercise in community Baseline:  Goal status: INITIAL  2.  Pt will improve FOTO to >= 50 to demo improved functional mobility Baseline:  Goal status: INITIAL  3.  Pt will improve hip strength to 4+/5 bilat to improve standing and walking tolerance Baseline:  Goal status: INITIAL  4.  Pt will tolerate laying on both sides with pain <= 2/10 Baseline:  Goal status: INITIAL  5.  Pt will improve 5 x STS to <= 15 seconds Baseline:  Goal status: INITIAL  6.  Pt will report transitioning sit to stand without dizziness symptoms 75% of the time Baseline:  Goal status: INITIAL      PLAN:  PT FREQUENCY: 2x/week  PT DURATION: 8 weeks  PLANNED INTERVENTIONS: 97164- PT Re-evaluation, 97110-Therapeutic exercises, 97530- Therapeutic activity, [id number]- Neuromuscular re-education, 97535- Self Care, [id number]- Manual therapy, [id number]- Aquatic Therapy, 97035- Ultrasound, [id number]- Ionotophoresis 4mg /ml Dexamethasone, Patient/Family education, Balance training, Taping, Dry Needling, Cryotherapy, and Moist heat  PLAN FOR NEXT SESSION: vestibular, DO A DGI and write goal! balance, hip stability, hip  strengthening   Salah Burlison, PT 01/23/2024, 10:59 AM

## 2024-01-23 NOTE — Assessment & Plan Note (Addendum)
 Heather Thompson returns, she is a pleasant 73 year old female, she has chronic bilateral hip pain, initially lateral, she also had some anterior pain. She has had bilateral hip arthroplasties. I did get x-rays at the last visit that did not show any signs of aseptic loosening. She has done some physical therapy and has had some improvement in her lateral hip pain but not the anterior pain. Though this could be referred from her back I would like to complete the workup for her hip joints with a three-phase bone scan. If this is negative then I would suspect that her anterior hip and thigh pain is coming from her back rather than the replacements.

## 2024-01-23 NOTE — Assessment & Plan Note (Signed)
 Trochanteric bursitis pain has improved/resolved with physical therapy.

## 2024-01-26 ENCOUNTER — Other Ambulatory Visit: Payer: Medicare Other

## 2024-01-28 ENCOUNTER — Ambulatory Visit
Admission: RE | Admit: 2024-01-28 | Discharge: 2024-01-28 | Disposition: A | Discharge status: Home / Self Care | Payer: Medicare Other | Source: Ambulatory Visit | Attending: Medical-Surgical | Admitting: Medical-Surgical

## 2024-01-28 ENCOUNTER — Ambulatory Visit
Admission: RE | Admit: 2024-01-28 | Discharge: 2024-01-28 | Disposition: A | Discharge status: Home / Self Care | Payer: Medicare Other | Source: Ambulatory Visit | Attending: Medical-Surgical

## 2024-01-28 DIAGNOSIS — N6322 Unspecified lump in the left breast, upper inner quadrant: Secondary | ICD-10-CM

## 2024-01-29 ENCOUNTER — Encounter: Payer: Self-pay | Admitting: Medical-Surgical

## 2024-01-29 ENCOUNTER — Encounter: Payer: Self-pay | Admitting: Physical Therapy

## 2024-01-29 ENCOUNTER — Ambulatory Visit: Payer: Medicare Other | Attending: Sports Medicine | Admitting: Physical Therapy

## 2024-01-29 ENCOUNTER — Telehealth (INDEPENDENT_AMBULATORY_CARE_PROVIDER_SITE_OTHER): Payer: Medicare Other | Admitting: Psychology

## 2024-01-29 DIAGNOSIS — M25552 Pain in left hip: Secondary | ICD-10-CM | POA: Insufficient documentation

## 2024-01-29 DIAGNOSIS — R42 Dizziness and giddiness: Secondary | ICD-10-CM | POA: Insufficient documentation

## 2024-01-29 DIAGNOSIS — M6281 Muscle weakness (generalized): Secondary | ICD-10-CM | POA: Insufficient documentation

## 2024-01-29 DIAGNOSIS — F5089 Other specified eating disorder: Secondary | ICD-10-CM

## 2024-01-29 DIAGNOSIS — F4323 Adjustment disorder with mixed anxiety and depressed mood: Secondary | ICD-10-CM

## 2024-01-29 DIAGNOSIS — M25551 Pain in right hip: Secondary | ICD-10-CM | POA: Insufficient documentation

## 2024-01-29 NOTE — Therapy (Signed)
 OUTPATIENT PHYSICAL THERAPY LOWER EXTREMITY and VESTIBULAR   Patient Name: Heather Thompson MRN: [id number] DOB:04-Jan-1951, 73 y.o., female Today's Date: 01/29/2024  END OF SESSION:  PT End of Session - 01/29/24 1230     Visit Number 11    Number of Visits 26    Date for PT Re-Evaluation 03/19/24    Authorization Type Medicare    Authorization - Visit Number 11    Progress Note Due on Visit 20    PT Start Time 1145    PT Stop Time 1228    PT Time Calculation (min) 43 min    Activity Tolerance Patient tolerated treatment well    Behavior During Therapy [facility] for tasks assessed/performed                       Past Medical History:  Diagnosis Date   Abnormality of gait 04/23/2019   Acquired hypothyroidism 07/29/2021   Allergic rhinitis due to animal hair and dander 12/02/2015   Formatting of this note might be different from the original.  [facility]  Seen by Dr. Rockwell Germany 11/11/15     Plan- Received allergy shots today. Follow up 1 year.     Allergy    Anemia    Anemia    Anxiety    Arthritis of scaphoid-trapezium-trapezoid joint of right hand 09/10/2023   B12 deficiency    Back pain    Chronic fatigue 07/29/2021   Chronic fatigue syndrome    Chronic throat clearing 03/14/2023   Clotting disorder (HCC)    Constipation    Depression with anxiety 04/07/2009   Formatting of this note might be different from the original.  well controlled on current meds, follows with psychiatrist in [place]. Had a   h/o severe depression 5 years ago, and had ECT.     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  B12 deficiency   Iron deficiency     Herniated  lumbar intervertebral disc 11/12/2020   High cholesterol    History of blood clots    History of cervical spinal surgery 01/01/2018   Formatting of this note might be different from the original.  Fusion C6 and C7 per 2018 MRI report.     History of left hip replacement 01/14/2018   History of Roux-en-Y gastric bypass    Hoarseness 03/14/2023   Hyperlipidemia 08/19/2010   Hypertension    Hyponatremia 01/17/2018   Hypothyroidism    Hypothyroidism    IBS (irritable bowel syndrome) 04/06/2021   Impaired fasting glucose 08/19/2010   Incontinence 10/30/2013   Formatting of this note might be different from the original. Formatting of this note might be different from the original. Urinary Incontinence     Infertility, female    Insomnia 04/07/2009   Formatting of this note might be different from the original.  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  Followed by [facility]  Seen by Dr. Reginia Naas 03/01/15     S/p changing thoracic spinal cord stimulator generator 08/18/14.     Lumbar spine films show spinal cord generator in left flank area. 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  Follows with Rheumatology in [facility] of this note might be different from the original.  Myoadenylate deaminase deficiency, diagnosed back in 1984, after muscle   biopsies     Neutrophilic leukocytosis 08/12/2012    OAB (overactive bladder) 08/28/2023   Obesity (BMI 30-39.9) 01/01/2018   Osteoarthritis    Osteomalacia 04/23/2019   Osteoporosis, senile 11/15/2010   Other myositis, left thigh 04/24/2023   Pain management contract agreement 11/17/2016   Formatting of this note might be different from the original.  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  Getting iron infusion via port at hematalogy/oncology center at [facility].   Was recently hospitalized in [facility] for severe anemia     Formatting of this note might be different from the original.  on OTC b12 currently     Vitamin D deficiency    Past Surgical History:  Procedure Laterality Date   ABDOMINAL HYSTERECTOMY     APPENDECTOMY     CARPAL TUNNEL RELEASE Bilateral 2023   with thumb joint  replacement   CERVICAL FUSION     CHOLECYSTECTOMY     FOOT SURGERY Bilateral    fusion   GASTRIC BYPASS     SPINAL CORD STIMULATOR INSERTION  07/2023   TOTAL HIP ARTHROPLASTY Bilateral    11/2016 & 05/2017   Patient Active Problem List   Diagnosis Date Noted   Palpitations 10/04/2023   Cardiac murmur 10/04/2023   Allergy    Anemia    Anxiety    B12 deficiency    Back pain    Chronic fatigue syndrome    Clotting disorder (HCC)    Constipation    Edema of both lower extremities    Fatty liver  Fibromyalgia    Gallbladder problem    High cholesterol    History of blood clots    History of Roux-en-Y gastric bypass    Hypothyroidism    Infertility, female    Joint pain    Kidney stones    Muscle disorder    Osteoarthritis    Prediabetes    Salzmann nodular degeneration    SOBOE (shortness of breath on exertion)    Swallowing difficulty    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   Arthritis of scaphoid-trapezium-trapezoid joint of right hand 09/10/2023   Urge incontinence 08/28/2023   OAB (overactive bladder) 08/28/2023   Lumbar post-laminectomy syndrome 07/06/2023   Snapping hip syndrome, 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   Other myositis, left thigh 04/24/2023   Sacroiliitis, not elsewhere classified (HCC) 04/24/2023   Dysphagia 04/17/2023   Elevated liver enzymes 04/02/2023   Elevated TSH 04/02/2023   Chronic throat clearing 03/14/2023   Globus sensation 03/14/2023   Hoarseness 03/14/2023   Muscle tension dysphonia 03/12/2023   Unspecified inflammatory spondylopathy, 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   Primary osteoarthritis of both knees 12/21/2021   Port-A-Cath in place 07/29/2021   Acquired hypothyroidism 07/29/2021   Chronic fatigue 07/29/2021   Vitamin B12 deficiency 07/29/2021   Vitamin D deficiency 07/29/2021   Postmenopausal estrogen deficiency 07/29/2021   IBS (irritable bowel syndrome) 04/06/2021   Retention of urine 04/06/2021   Herniated lumbar  intervertebral disc 11/12/2020   Skin sensation disturbance 11/12/2020   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   Spondylopathy, unspecified 11/12/2020   Trochanteric bursitis of both hips 11/19/2019   Low serum iron 04/23/2019   Osteomalacia 04/23/2019   Abnormality of gait 04/23/2019   History of bilateral hip arthroplasty 06/26/2018   Gastroesophageal reflux disease 06/18/2018   Hypertension 06/18/2018   Intertrigo 04/30/2018   Lentigines 04/30/2018   Hyponatremia 01/17/2018   History of cervical spinal surgery 01/01/2018   Obesity (BMI 30-39.9) 01/01/2018   SK (seborrheic keratosis) 04/18/2017   Pain management contract agreement 11/17/2016   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   Allergic rhinitis due to animal hair and dander 12/02/2015   Low back pain 03/03/2015   Incontinence 10/30/2013   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   Neutrophilic leukocytosis 08/12/2012   Thrombocytosis 08/12/2012   Osteoporosis, senile 11/15/2010   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   Hyperlipidemia 08/19/2010   Impaired fasting glucose 08/19/2010   Polyarthritis 09/28/2009   Degenerative disc disease 04/07/2009   Depression with anxiety 04/07/2009   Myopathy 04/07/2009   Insomnia 04/07/2009   Migraine 04/07/2009   Scoliosis 04/07/2009   Seasonal allergies 04/07/2009   Vitamin B12 deficiency anemia 04/07/2009    PCP: Christen Butter  REFERRING PROVIDER: Verdell Carmine DIAG: bilateral trochanteric bursitis, vertigo  THERAPY DIAG:  Bilateral hip pain  Muscle weakness (generalized)  Dizziness and giddiness  Rationale for Evaluation and Treatment: Rehabilitation  ONSET DATE: 04/2023  SUBJECTIVE:   SUBJECTIVE STATEMENT: Pt had follow up with MD who suggests continuing PT and increased her medication. He also ordered a bone scan which she will have next week. She will also have her MRI for neurology for dizziness next week. Pt states she is having getting her Rt hip in/out of car due to groin  pain.    PERTINENT HISTORY: Bilat THA 2018, fibromyalgia, OA, spinal cord stimulator, bilat TC joint fusion  Pt had bilateral hip replacements in 2018. Pt began having Lt hip and groin pain last summer that was so severe she went to ED. She was eventually diagnosed with bursitis.  Since that time the pain has continued and is "always there". She now has bilateral hip pain, groin pain. Hips are tender to touch. Pain increases with laying on her sides, pain with sit to stand, prolonged standing and walking. Pt has not found a way to relieve pain  Vestibular eval: pt states dizziness has been going on for "quite a while". Pt states she feels "room is spinning" when she goes from sit to stand. She reports she also feels that sensation when she goes from supine to sit but it is not as severe. She also states she veers to the right while walking. She also states that if she is standing still she will occasionally feel like she is "tipping over" to the right. The sensation only lasts a few seconds. Pt states she has had a cardiac work up which ruled out cardiac cause of dizziness PAIN:  Are you having pain? Yes: NPRS scale: 6/10 currently, 8-9/10 at worst Pain location: bilat hips Lt > Rt Pain description: sore, tender Aggravating factors: sitting in recliner, laying on sides, sit to stand Relieving factors: unknown  PRECAUTIONS: pt has spinal cord stimulator  RED FLAGS: None   WEIGHT BEARING RESTRICTIONS: No  FALLS:  Has patient fallen in last 6 months? No  OCCUPATION: retired  PLOF: Independent  PATIENT GOALS: reduce pain  NEXT MD VISIT: February 2025  OBJECTIVE:  Note: Objective measures were completed at Evaluation unless otherwise noted.  DIAGNOSTIC FINDINGS: x ray performed but not read at time as eval  PATIENT SURVEYS:  FOTO 39   MUSCLE LENGTH: Hamstrings: decreased bilat Hip flexors: decreased bilat   PALPATION: TTP bilat hip flexors, ITB, glutes, HS Increased mm  spasticity bilat hamstrings, glutes, quads  LOWER EXTREMITY ROM:  Active ROM Right eval Left eval  Hip flexion    Hip extension    Hip abduction    Hip adduction    Hip internal rotation    Hip external rotation 30 50  Knee flexion    Knee extension    Ankle dorsiflexion    Ankle plantarflexion    Ankle inversion    Ankle eversion     (Blank rows = not tested)  LOWER EXTREMITY MMT:  MMT Right eval Left eval  Hip flexion 3+ 3  Hip extension 3 3  Hip abduction 3 3  Hip adduction    Hip internal rotation    Hip external rotation 3 3+  Knee flexion    Knee extension    Ankle dorsiflexion    Ankle plantarflexion    Ankle inversion    Ankle eversion     (Blank rows = not tested)   FUNCTIONAL TESTS:  5 times sit to stand: 18.85 seconds  VESTIBULAR ASSESSMENT:     SYMPTOM BEHAVIOR:  Subjective history: pt states dizziness has been going on for "quite a while". Pt states she feels "room is spinning" when she goes from sit to stand. She reports she also feels that sensation when she goes from supine to sit but it is not as severe. She also states she veers to the right while walking. She also states that if she is standing still she will occasionally feel like she is "tipping over" to the right. The sensation only lasts a few seconds.  Non-Vestibular symptoms:  none reported  Type of dizziness: Spinning/Vertigo  Frequency: multiple times a day  Duration: seconds  Aggravating factors: Induced by position change: supine to sit, sit to stand, and walking  Relieving factors:  slow movements  Progression of symptoms: unchanged  OCULOMOTOR EXAM:  Ocular Alignment: normal  Ocular ROM: No Limitations  Spontaneous Nystagmus: absent  Gaze-Induced Nystagmus: age appropriate nystagmus at end range  Smooth Pursuits: saccades and pt reports "spinning" sensation when focusing on pen  Saccades: intact     VESTIBULAR - OCULAR REFLEX:   Slow VOR: Positive Bilaterally increased  spinning sensation  VOR Cancellation: Comment: increased spinning sensation  Head-Impulse Test: HIT Right: positive HIT Left: negative     POSITIONAL TESTING: Right Dix-Hallpike: no nystagmus Left Dix-Hallpike: no nystagmus Right Roll Test: no nystagmus Left Roll Test: no nystagmus   MOTION SENSITIVITY:    Motion Sensitivity Quotient Intensity: 0 = none, 1 = Lightheaded, 2 = Mild, 3 = Moderate, 4 = Severe, 5 = Vomiting  Intensity  1. Sitting to supine   2. Supine to L side   3. Supine to R side   4. Supine to sitting   5. L Hallpike-Dix   6. Up from L    7. R Hallpike-Dix   8. Up from R    9. Sitting, head  tipped to L knee   10. Head up from L  knee   11. Sitting, head  tipped to R knee   12. Head up from R  knee   13. Sitting head turns x5   14.Sitting head nods x5   15. In stance, 180  turn to L    16. In stance, 180  turn to R       [facility] Adult PT Treatment:                                                DATE: 01/29/24 Therapeutic Exercise/Activity/NMR: Long sitting SLR on Lt x 10, Rt unable Figure 4 isometrics Rt LE Attempted lunge stretch for Rt adductors - pt unable to feel stretch Pigeon pose stretch for Rt adductors - good response DGI performed: 18/24 Standing marching with countertop support with head turns, head nods Standing march with countertop support with VOR x 1 horizontal Discussed activity modifications for increased hip pain   [facility] Adult PT Treatment:                                                DATE: 01/23/24  PT Re-evaluation to add vestibular diagnosis Neuromuscular re-ed: VOR x 1 horizontal and vertical seated 3 sets to tolerance Therapeutic Activity: Pt education on vestibular diagnosis and rationale for treatment   [facility] Adult PT Treatment:                                                DATE: 01/18/24 Therapeutic Exercise/Activity: Sidestep green TB Hip ext green TB 2 x 10 Side step up with hip abd 6'' step x 10 bilat Hip add  ball squeeze x 15 Attempted seated hip IR - limited by pain Green TB pull down (shld ext) with slow march Tandem stance with green TB row Tandem stance on airex with trunk rotation 2 x 30 sec STS without UE support x 12  PATIENT EDUCATION:  Education details: PT POC and goals, HEP Person educated: Patient Education method: Explanation, Demonstration, and Handouts Education comprehension: verbalized understanding and returned demonstration  HOME EXERCISE PROGRAM: Access Code: [email or web address] URL: [email or web address] Date: 01/23/2024 Prepared by: Reggy Eye  Exercises - Sit to Stand  - 1 x daily - 7 x weekly - 2 sets - 10 reps - Seated Hamstring Stretch  - 1 x daily - 7 x weekly - 1 sets - 3 reps - 20-30 seconds hold - Seated Figure 4 Piriformis Stretch  - 1 x daily - 7 x weekly - 1 sets - 3 reps - 20-30 seconds hold - Standing Hip Abduction with Bent Knee  - 1 x daily - 7 x weekly - 3 sets - 10 reps - Standing Hip Extension with Leg Bent and Support  - 1 x daily - 7 x weekly - 3 sets - 10 reps - Single Leg 3 Way Reach with Support  - 1 x daily - 7 x weekly - 3 sets - 10 reps - Seated Hip Internal Rotation with Ball and Resistance  - 1 x daily - 7 x weekly - 3 sets - 10 reps - Seated Hip Flexor Stretch  - 1 x daily - 7 x weekly - 1 sets - 3 reps - 30 seconds hold - Resistance Pulldown with March  - 1 x daily - 7 x weekly - 3 sets - 10 reps - Standing Bilateral Low Shoulder Row with Anchored Resistance  - 1 x daily - 7 x weekly - 3 sets - 10 reps - Standing Anti-Rotation Press with Anchored Resistance  - 1 x daily - 7 x weekly - 3 sets - 10 reps - Seated Gaze Stabilization with Head Nod  - 3 x daily - 7 x weekly - 3 sets - 10 reps - Seated Gaze Stabilization with Head Rotation  - 3 x daily - 7 x weekly - 3 sets - 10 reps  ASSESSMENT:  CLINICAL  IMPRESSION: Treatment session initially focused on education for activity management due to increased hip pain. Then continued with progression of vestibular exercise. Pt scored 18/24 on DGI and will benefit from continued balance training. Pt with good response to standing marching with head turns - added to HEP.  OBJECTIVE IMPAIRMENTS: decreased activity tolerance, decreased mobility, difficulty walking, decreased ROM, decreased strength, increased muscle spasms, impaired flexibility, and pain.     GOALS: Goals reviewed with patient? Yes  SHORT TERM GOALS: Target date: 01/09/2024   Pt will be independent with initial HEP Baseline: Goal status: MET  2.  Pt will improve FOTO to > = 45 to demo improving functional mobility Baseline: 33 on 01/11/24 Goal status: IN PROGRESS   LONG TERM GOALS: Target date: 03/19/2024      Pt will be independent with advanced HEP and plan for continued exercise in community Baseline:  Goal status: INITIAL  2.  Pt will improve FOTO to >= 50 to demo improved functional mobility Baseline:  Goal status: INITIAL  3.  Pt will improve hip strength to 4+/5 bilat to improve standing and walking tolerance Baseline:  Goal status: INITIAL  4.  Pt will tolerate laying on both sides with pain <= 2/10 Baseline:  Goal status: INITIAL  5.  Pt will improve 5 x STS to <= 15 seconds Baseline:  Goal status: INITIAL  6.  Pt will report transitioning sit to stand without dizziness symptoms 75% of the time Baseline:  Goal status: INITIAL 7.  Pt will demo decreased risk of falls with DGI >= 21/24 Baseline: 18/24 Goal status: INITIAL       PLAN:  PT FREQUENCY: 2x/week  PT DURATION: 8 weeks  PLANNED INTERVENTIONS: 97164- PT Re-evaluation, 97110-Therapeutic exercises, 97530- Therapeutic activity, 97112- Neuromuscular re-education, 97535- Self Care, 96045- Manual therapy, [id number]- Aquatic Therapy, 97035- Ultrasound, [id number]- Ionotophoresis 4mg /ml  Dexamethasone, Patient/Family education, Balance training, Taping, Dry Needling, Cryotherapy, and Moist heat  PLAN FOR NEXT SESSION: vestibular, balance, hip stability, hip strengthening   Aadit Hagood, PT 01/29/2024, 12:31 PM

## 2024-01-29 NOTE — Progress Notes (Signed)
 Office: [phone]  /  Fax: [phone]    Date: January 29, 2024  Appointment Start Time: 2:39pm Duration: 24 minutes Provider: Lawerance Cruel, Psy.D. Type of Session: Individual Therapy  Location of Patient: Home (private location) Location of Provider: Provider's Home (private office) Type of Contact: Telepsychological Visit via MyChart Video Visit  Session Content: This provider called Heather Thompson at 2:34pm as she did not present for today's appointment. Assistance was provided to help her connect. As such, today's appointment was initiated 9 minutes late. Heather Thompson is a 73 y.o. female presenting for a follow-up appointment to address the previously established treatment goal of increasing coping skills. Today's appointment was a telepsychological visit. Heather Thompson provided verbal consent for today's telepsychological appointment and she is aware she is responsible for securing confidentiality on her end of the session. Prior to proceeding with today's appointment, Heather Thompson's physical location at the time of this appointment was obtained as well a phone number she could be reached at in the event of technical difficulties. Heather Thompson and this provider participated in today's telepsychological service.   Of note, today's appointment was switched to a regular telephone call at 2:42pm with Heather Thompson's verbal consent due to technical issues.  This provider conducted a brief check-in. Heather Thompson reported feeling "discouraged" and "disappointed" with progress to date, adding she has tried "everything" recommended by her provider for weight loss. Further explored and processed. She noted while she has implemented what was discussed during the last appointment with this provider, she continues to not eat enough. She described herself as a "grazer." Reviewed the importance of eating regularly and enough. Heather Thompson agreed to eat small/frequent meals/snacks by choosing foods from her structured meal plan. Of note, Heather Thompson indicated she had a  primary therapist up until the holidays, but she discontinued services. As such, she requested referrals. Additionally, she provided verbal consent for this provider to place a referral with [facility]. Overall, Heather Thompson was receptive to today's appointment as evidenced by openness to sharing, responsiveness to feedback, and willingness to implement discussed strategies .  Mental Status Examination:  Appearance: neat Behavior: appropriate to circumstances Mood: sad Affect: mood congruent Speech: WNL Eye Contact: appropriate Psychomotor Activity: WNL Gait: unable to assess Thought Process: linear, logical, and goal directed and no evidence or endorsement of suicidal, homicidal, and self-harm ideation, plan and intent  Thought Content/Perception: no hallucinations, delusions, bizarre thinking or behavior endorsed or observed Orientation: AAOx4 Memory/Concentration: intact Insight: fair Judgment: fair  Interventions:  Conducted a brief chart review Provided empathic reflections and validation Reviewed content from the previous session Provided positive reinforcement Employed supportive psychotherapy interventions to facilitate reduced distress and to improve coping skills with identified stressors Engaged patient in problem solving  DSM-5 Diagnosis(es):  F50.89 Other Specified Feeding or Eating Disorder, Emotional Eating Behaviors and  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 Heather Thompson has demonstrated progress in her goal as evidenced by increased awareness of hunger patterns and increased awareness of triggers for emotional eating behaviors. Heather Thompson also continues to demonstrate willingness to engage in learned skill(s).  Plan: The next appointment is scheduled for 02/04/2024 at 2:30pm, which will be via  MyChart Video Visit. The next session will focus on working towards the established treatment goal. A referral will be placed with [name]. Heather Thompson provided verbal consent for this provider to e-mail referral options.    Lawerance Cruel, PsyD

## 2024-01-31 ENCOUNTER — Ambulatory Visit (HOSPITAL_COMMUNITY)
Admission: RE | Admit: 2024-01-31 | Discharge: 2024-01-31 | Disposition: A | Discharge status: Home / Self Care | Source: Ambulatory Visit | Attending: Neurology | Admitting: Neurology

## 2024-01-31 ENCOUNTER — Encounter (HOSPITAL_COMMUNITY)
Admission: RE | Admit: 2024-01-31 | Discharge: 2024-01-31 | Disposition: A | Discharge status: Home / Self Care | Payer: Medicare Other | Source: Ambulatory Visit | Attending: Sports Medicine | Admitting: Sports Medicine

## 2024-01-31 ENCOUNTER — Ambulatory Visit (HOSPITAL_COMMUNITY)
Admission: RE | Admit: 2024-01-31 | Discharge: 2024-01-31 | Disposition: A | Discharge status: Home / Self Care | Payer: Medicare Other | Source: Ambulatory Visit | Attending: Sports Medicine | Admitting: Sports Medicine

## 2024-01-31 DIAGNOSIS — H8111 Benign paroxysmal vertigo, right ear: Secondary | ICD-10-CM

## 2024-01-31 DIAGNOSIS — Z96643 Presence of artificial hip joint, bilateral: Secondary | ICD-10-CM | POA: Diagnosis present

## 2024-01-31 MED ORDER — TECHNETIUM TC 99M MEDRONATE IV KIT
19.0000 | PACK | Freq: Once | INTRAVENOUS | Status: AC | PRN
  Administered 2024-01-31: 19 via INTRAVENOUS

## 2024-02-01 ENCOUNTER — Ambulatory Visit: Payer: Medicare Other | Admitting: Physical Therapy

## 2024-02-01 ENCOUNTER — Encounter: Payer: Self-pay | Admitting: Physical Therapy

## 2024-02-01 DIAGNOSIS — M6281 Muscle weakness (generalized): Secondary | ICD-10-CM

## 2024-02-01 DIAGNOSIS — R42 Dizziness and giddiness: Secondary | ICD-10-CM

## 2024-02-01 DIAGNOSIS — M25551 Pain in right hip: Secondary | ICD-10-CM | POA: Diagnosis not present

## 2024-02-01 DIAGNOSIS — M25552 Pain in left hip: Secondary | ICD-10-CM

## 2024-02-01 NOTE — Therapy (Signed)
 OUTPATIENT PHYSICAL THERAPY LOWER EXTREMITY and VESTIBULAR   Patient Name: Heather Thompson MRN: [id number] DOB:06/07/1951, 73 y.o., female Today's Date: 02/01/2024  END OF SESSION:  PT End of Session - 02/01/24 1147     Visit Number 12    Number of Visits 26    Date for PT Re-Evaluation 03/19/24    Authorization Type Medicare    Authorization - Visit Number 12    Progress Note Due on Visit 20    PT Start Time 1100    PT Stop Time 1145    PT Time Calculation (min) 45 min    Activity Tolerance Patient tolerated treatment well    Behavior During Therapy [facility] for tasks assessed/performed                        Past Medical History:  Diagnosis Date   Abnormality of gait 04/23/2019   Acquired hypothyroidism 07/29/2021   Allergic rhinitis due to animal hair and dander 12/02/2015   Formatting of this note might be different from the original.  [facility]  Seen by Dr. Rockwell Germany 11/11/15     Plan- Received allergy shots today. Follow up 1 year.     Allergy    Anemia    Anemia    Anxiety    Arthritis of scaphoid-trapezium-trapezoid joint of right hand 09/10/2023   B12 deficiency    Back pain    Chronic fatigue 07/29/2021   Chronic fatigue syndrome    Chronic throat clearing 03/14/2023   Clotting disorder (HCC)    Constipation    Depression with anxiety 04/07/2009   Formatting of this note might be different from the original.  well controlled on current meds, follows with psychiatrist in [place]. Had a   h/o severe depression 5 years ago, and had ECT.     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  B12 deficiency   Iron deficiency     Herniated  lumbar intervertebral disc 11/12/2020   High cholesterol    History of blood clots    History of cervical spinal surgery 01/01/2018   Formatting of this note might be different from the original.  Fusion C6 and C7 per 2018 MRI report.     History of left hip replacement 01/14/2018   History of Roux-en-Y gastric bypass    Hoarseness 03/14/2023   Hyperlipidemia 08/19/2010   Hypertension    Hyponatremia 01/17/2018   Hypothyroidism    Hypothyroidism    IBS (irritable bowel syndrome) 04/06/2021   Impaired fasting glucose 08/19/2010   Incontinence 10/30/2013   Formatting of this note might be different from the original. Formatting of this note might be different from the original. Urinary Incontinence     Infertility, female    Insomnia 04/07/2009   Formatting of this note might be different from the original.  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  [facility]  Seen by Dr. Reginia Naas 03/01/15     S/p changing thoracic spinal cord stimulator generator 08/18/14.     Lumbar spine films show spinal cord generator in left flank area. 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  Follows with Rheumatology in [facility] of this note might be different from the original.  Myoadenylate deaminase deficiency, diagnosed back in 1984, after muscle   biopsies     Neutrophilic leukocytosis 08/12/2012    OAB (overactive bladder) 08/28/2023   Obesity (BMI 30-39.9) 01/01/2018   Osteoarthritis    Osteomalacia 04/23/2019   Osteoporosis, senile 11/15/2010   Other myositis, left thigh 04/24/2023   Pain management contract agreement 11/17/2016   Formatting of this note might be different from the original.  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  Getting iron infusion via port at hematalogy/oncology center at [facility].   Was recently hospitalized in [facility] for severe anemia     Formatting of this note might be different from the original.  on OTC b12 currently     Vitamin D deficiency    Past Surgical History:  Procedure Laterality Date   ABDOMINAL HYSTERECTOMY     APPENDECTOMY     CARPAL TUNNEL RELEASE Bilateral 2023   with thumb joint  replacement   CERVICAL FUSION     CHOLECYSTECTOMY     FOOT SURGERY Bilateral    fusion   GASTRIC BYPASS     SPINAL CORD STIMULATOR INSERTION  07/2023   TOTAL HIP ARTHROPLASTY Bilateral    11/2016 & 05/2017   Patient Active Problem List   Diagnosis Date Noted   Palpitations 10/04/2023   Cardiac murmur 10/04/2023   Allergy    Anemia    Anxiety    B12 deficiency    Back pain    Chronic fatigue syndrome    Clotting disorder (HCC)    Constipation    Edema of both lower extremities  Fatty liver    Fibromyalgia    Gallbladder problem    High cholesterol    History of blood clots    History of Roux-en-Y gastric bypass    Hypothyroidism    Infertility, female    Joint pain    Kidney stones    Muscle disorder    Osteoarthritis    Prediabetes    Salzmann nodular degeneration    SOBOE (shortness of breath on exertion)    Swallowing difficulty    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   Arthritis of scaphoid-trapezium-trapezoid joint of right hand 09/10/2023   Urge incontinence 08/28/2023   OAB (overactive bladder) 08/28/2023   Lumbar post-laminectomy syndrome 07/06/2023   Snapping hip syndrome, 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   Other myositis, left thigh 04/24/2023   Sacroiliitis, not elsewhere classified (HCC) 04/24/2023   Dysphagia 04/17/2023   Elevated liver enzymes 04/02/2023   Elevated TSH 04/02/2023   Chronic throat clearing 03/14/2023   Globus sensation 03/14/2023   Hoarseness 03/14/2023   Muscle tension dysphonia 03/12/2023   Unspecified inflammatory spondylopathy, 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   Primary osteoarthritis of both knees 12/21/2021   Port-A-Cath in place 07/29/2021   Acquired hypothyroidism 07/29/2021   Chronic fatigue 07/29/2021   Vitamin B12 deficiency 07/29/2021   Vitamin D deficiency 07/29/2021   Postmenopausal estrogen deficiency 07/29/2021   IBS (irritable bowel syndrome) 04/06/2021   Retention of urine 04/06/2021   Herniated lumbar  intervertebral disc 11/12/2020   Skin sensation disturbance 11/12/2020   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   Spondylopathy, unspecified 11/12/2020   Trochanteric bursitis of both hips 11/19/2019   Low serum iron 04/23/2019   Osteomalacia 04/23/2019   Abnormality of gait 04/23/2019   History of bilateral hip arthroplasty 06/26/2018   Gastroesophageal reflux disease 06/18/2018   Hypertension 06/18/2018   Intertrigo 04/30/2018   Lentigines 04/30/2018   Hyponatremia 01/17/2018   History of cervical spinal surgery 01/01/2018   Obesity (BMI 30-39.9) 01/01/2018   SK (seborrheic keratosis) 04/18/2017   Pain management contract agreement 11/17/2016   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   Allergic rhinitis due to animal hair and dander 12/02/2015   Low back pain 03/03/2015   Incontinence 10/30/2013   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   Neutrophilic leukocytosis 08/12/2012   Thrombocytosis 08/12/2012   Osteoporosis, senile 11/15/2010   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   Hyperlipidemia 08/19/2010   Impaired fasting glucose 08/19/2010   Polyarthritis 09/28/2009   Degenerative disc disease 04/07/2009   Depression with anxiety 04/07/2009   Myopathy 04/07/2009   Insomnia 04/07/2009   Migraine 04/07/2009   Scoliosis 04/07/2009   Seasonal allergies 04/07/2009   Vitamin B12 deficiency anemia 04/07/2009    PCP: Christen Butter  REFERRING PROVIDER: Verdell Carmine DIAG: bilateral trochanteric bursitis, vertigo  THERAPY DIAG:  Bilateral hip pain  Muscle weakness (generalized)  Dizziness and giddiness  Rationale for Evaluation and Treatment: Rehabilitation  ONSET DATE: 04/2023  SUBJECTIVE:   SUBJECTIVE STATEMENT: Pt had her bone yesterday but does not have results yet. She states her pain is about the same in her hips. She does endorse that her Lt hip is less sensitive. She states she has been doing her dizziness exercises and that things are "stable".    PERTINENT HISTORY: Bilat  THA 2018, fibromyalgia, OA, spinal cord stimulator, bilat TC joint fusion  Pt had bilateral hip replacements in 2018. Pt began having Lt hip and groin pain last summer that was so severe she went to ED. She was eventually diagnosed with bursitis. Since that time  the pain has continued and is "always there". She now has bilateral hip pain, groin pain. Hips are tender to touch. Pain increases with laying on her sides, pain with sit to stand, prolonged standing and walking. Pt has not found a way to relieve pain  Vestibular eval: pt states dizziness has been going on for "quite a while". Pt states she feels "room is spinning" when she goes from sit to stand. She reports she also feels that sensation when she goes from supine to sit but it is not as severe. She also states she veers to the right while walking. She also states that if she is standing still she will occasionally feel like she is "tipping over" to the right. The sensation only lasts a few seconds. Pt states she has had a cardiac work up which ruled out cardiac cause of dizziness PAIN:  Are you having pain? Yes: NPRS scale: 6/10 currently, 8-9/10 at worst Pain location: bilat hips Lt > Rt Pain description: sore, tender Aggravating factors: sitting in recliner, laying on sides, sit to stand Relieving factors: unknown  PRECAUTIONS: pt has spinal cord stimulator  RED FLAGS: None   WEIGHT BEARING RESTRICTIONS: No  FALLS:  Has patient fallen in last 6 months? No  OCCUPATION: retired  PLOF: Independent  PATIENT GOALS: reduce pain  NEXT MD VISIT: February 2025  OBJECTIVE:  Note: Objective measures were completed at Evaluation unless otherwise noted.  DIAGNOSTIC FINDINGS: x ray performed but not read at time as eval  PATIENT SURVEYS:  FOTO 39   MUSCLE LENGTH: Hamstrings: decreased bilat Hip flexors: decreased bilat   PALPATION: TTP bilat hip flexors, ITB, glutes, HS Increased mm spasticity bilat hamstrings, glutes,  quads  LOWER EXTREMITY ROM:  Active ROM Right eval Left eval  Hip flexion    Hip extension    Hip abduction    Hip adduction    Hip internal rotation    Hip external rotation 30 50  Knee flexion    Knee extension    Ankle dorsiflexion    Ankle plantarflexion    Ankle inversion    Ankle eversion     (Blank rows = not tested)  LOWER EXTREMITY MMT:  MMT Right eval Left eval  Hip flexion 3+ 3  Hip extension 3 3  Hip abduction 3 3  Hip adduction    Hip internal rotation    Hip external rotation 3 3+  Knee flexion    Knee extension    Ankle dorsiflexion    Ankle plantarflexion    Ankle inversion    Ankle eversion     (Blank rows = not tested)   FUNCTIONAL TESTS:  5 times sit to stand: 18.85 seconds  VESTIBULAR ASSESSMENT:     SYMPTOM BEHAVIOR:  Subjective history: pt states dizziness has been going on for "quite a while". Pt states she feels "room is spinning" when she goes from sit to stand. She reports she also feels that sensation when she goes from supine to sit but it is not as severe. She also states she veers to the right while walking. She also states that if she is standing still she will occasionally feel like she is "tipping over" to the right. The sensation only lasts a few seconds.  Non-Vestibular symptoms:  none reported  Type of dizziness: Spinning/Vertigo  Frequency: multiple times a day  Duration: seconds  Aggravating factors: Induced by position change: supine to sit, sit to stand, and walking  Relieving factors: slow movements  Progression of symptoms: unchanged  OCULOMOTOR EXAM:  Ocular Alignment: normal  Ocular ROM: No Limitations  Spontaneous Nystagmus: absent  Gaze-Induced Nystagmus: age appropriate nystagmus at end range  Smooth Pursuits: saccades and pt reports "spinning" sensation when focusing on pen  Saccades: intact     VESTIBULAR - OCULAR REFLEX:   Slow VOR: Positive Bilaterally increased spinning sensation  VOR  Cancellation: Comment: increased spinning sensation  Head-Impulse Test: HIT Right: positive HIT Left: negative     POSITIONAL TESTING: Right Dix-Hallpike: no nystagmus Left Dix-Hallpike: no nystagmus Right Roll Test: no nystagmus Left Roll Test: no nystagmus   MOTION SENSITIVITY:    Motion Sensitivity Quotient Intensity: 0 = none, 1 = Lightheaded, 2 = Mild, 3 = Moderate, 4 = Severe, 5 = Vomiting  Intensity  1. Sitting to supine   2. Supine to L side   3. Supine to R side   4. Supine to sitting   5. L Hallpike-Dix   6. Up from L    7. R Hallpike-Dix   8. Up from R    9. Sitting, head  tipped to L knee   10. Head up from L  knee   11. Sitting, head  tipped to R knee   12. Head up from R  knee   13. Sitting head turns x5   14.Sitting head nods x5   15. In stance, 180  turn to L    16. In stance, 180  turn to R      [facility] Adult PT Treatment:                                                DATE: 02/01/24 Therapeutic Exercise/Activity/NMR: Seated hip IR with ball as fulcrum Lt x 10, Rt x 2 - limited by pain Standing marching with countertop support with head turns, head nods Standing on airex head nods, turns ---> marching on airex head nods, turns VOR with cancelllation vertical and horizontal. Standing with plain background Standing on airex tandem stance 2 x 30 sec bilat Attempted rocker board tapping - unable due to Rt hip pain Rocker board static hold laterally/vertically 2 x 30 sec   [facility] Adult PT Treatment:                                                DATE: 01/29/24 Therapeutic Exercise/Activity/NMR: Long sitting SLR on Lt x 10, Rt unable Figure 4 isometrics Rt LE Attempted lunge stretch for Rt adductors - pt unable to feel stretch Pigeon pose stretch for Rt adductors - good response DGI performed: 18/24 Standing marching with countertop support with head turns, head nods Standing march with countertop support with VOR x 1 horizontal Discussed activity  modifications for increased hip pain   [facility] Adult PT Treatment:                                                DATE: 01/23/24  PT Re-evaluation to add vestibular diagnosis Neuromuscular re-ed: VOR x 1 horizontal and vertical seated 3 sets to tolerance Therapeutic Activity: Pt education on vestibular diagnosis  and rationale for treatment                                                                                                                            PATIENT EDUCATION:  Education details: PT POC and goals, HEP Person educated: Patient Education method: Explanation, Demonstration, and Handouts Education comprehension: verbalized understanding and returned demonstration  HOME EXERCISE PROGRAM: Access Code: [email or web address] URL: [email or web address] Date: 02/01/2024 Prepared by: Reggy Eye  Exercises - Sit to Stand  - 1 x daily - 7 x weekly - 2 sets - 10 reps - Seated Hamstring Stretch  - 1 x daily - 7 x weekly - 1 sets - 3 reps - 20-30 seconds hold - Seated Figure 4 Piriformis Stretch  - 1 x daily - 7 x weekly - 1 sets - 3 reps - 20-30 seconds hold - Standing Hip Abduction with Bent Knee  - 1 x daily - 7 x weekly - 3 sets - 10 reps - Standing Hip Extension with Leg Bent and Support  - 1 x daily - 7 x weekly - 3 sets - 10 reps - Single Leg 3 Way Reach with Support  - 1 x daily - 7 x weekly - 3 sets - 10 reps - Seated Hip Internal Rotation with Ball and Resistance  - 1 x daily - 7 x weekly - 3 sets - 10 reps - Seated Hip Flexor Stretch  - 1 x daily - 7 x weekly - 1 sets - 3 reps - 30 seconds hold - Resistance Pulldown with March  - 1 x daily - 7 x weekly - 3 sets - 10 reps - Standing Bilateral Low Shoulder Row with Anchored Resistance  - 1 x daily - 7 x weekly - 3 sets - 10 reps - Standing Anti-Rotation Press with Anchored Resistance  - 1 x daily - 7 x weekly - 3 sets - 10 reps - Seated Gaze Stabilization with Head Nod  - 3 x daily - 7 x weekly - 3 sets - 10  reps - Seated Gaze Stabilization with Head Rotation  - 3 x daily - 7 x weekly - 3 sets - 10 reps - Seated VOR Cancellation  - 1 x daily - 7 x weekly - 3 sets - 10 reps  ASSESSMENT:  CLINICAL IMPRESSION: Pt continues to be limited by Rt > Lt  hip pain. She is unable to put increased wt on that hip during rocker board exercise and cannot perform hip IR this session due to pain. She is making progress with her dizziness and is able to progress VOR and balance activities today.  OBJECTIVE IMPAIRMENTS: decreased activity tolerance, decreased mobility, difficulty walking, decreased ROM, decreased strength, increased muscle spasms, impaired flexibility, and pain.     GOALS: Goals reviewed with patient? Yes  SHORT TERM GOALS: Target date: 01/09/2024   Pt will be independent with initial HEP Baseline: Goal status: MET  2.  Pt will improve FOTO  to > = 45 to demo improving functional mobility Baseline: 33 on 01/11/24 Goal status: IN PROGRESS   LONG TERM GOALS: Target date: 03/19/2024      Pt will be independent with advanced HEP and plan for continued exercise in community Baseline:  Goal status: INITIAL  2.  Pt will improve FOTO to >= 50 to demo improved functional mobility Baseline:  Goal status: INITIAL  3.  Pt will improve hip strength to 4+/5 bilat to improve standing and walking tolerance Baseline:  Goal status: INITIAL  4.  Pt will tolerate laying on both sides with pain <= 2/10 Baseline:  Goal status: INITIAL  5.  Pt will improve 5 x STS to <= 15 seconds Baseline:  Goal status: INITIAL  6.  Pt will report transitioning sit to stand without dizziness symptoms 75% of the time Baseline:  Goal status: INITIAL 7.  Pt will demo decreased risk of falls with DGI >= 21/24 Baseline: 18/24 Goal status: INITIAL       PLAN:  PT FREQUENCY: 2x/week  PT DURATION: 8 weeks  PLANNED INTERVENTIONS: 97164- PT Re-evaluation, 97110-Therapeutic exercises, 97530- Therapeutic  activity, 97112- Neuromuscular re-education, 97535- Self Care, [id number]- Manual therapy, [id number]- Aquatic Therapy, 97035- Ultrasound, [id number]- Ionotophoresis 4mg /ml Dexamethasone, Patient/Family education, Balance training, Taping, Dry Needling, Cryotherapy, and Moist heat  PLAN FOR NEXT SESSION: vestibular, balance, hip stability, hip strengthening   Jamie Belger, PT 02/01/2024, 11:47 AM

## 2024-02-02 ENCOUNTER — Other Ambulatory Visit: Payer: Self-pay | Admitting: Medical-Surgical

## 2024-02-04 ENCOUNTER — Telehealth (INDEPENDENT_AMBULATORY_CARE_PROVIDER_SITE_OTHER): Admitting: Psychology

## 2024-02-05 ENCOUNTER — Encounter: Payer: Self-pay | Admitting: Physical Therapy

## 2024-02-05 ENCOUNTER — Telehealth: Payer: Self-pay

## 2024-02-05 ENCOUNTER — Ambulatory Visit: Admitting: Physical Therapy

## 2024-02-05 DIAGNOSIS — R42 Dizziness and giddiness: Secondary | ICD-10-CM

## 2024-02-05 DIAGNOSIS — M25551 Pain in right hip: Secondary | ICD-10-CM

## 2024-02-05 DIAGNOSIS — M6281 Muscle weakness (generalized): Secondary | ICD-10-CM

## 2024-02-05 NOTE — Therapy (Signed)
 OUTPATIENT PHYSICAL THERAPY LOWER EXTREMITY and VESTIBULAR   Patient Name: Heather Thompson MRN: [id number] DOB:03-Nov-1951, 73 y.o., female Today's Date: 02/05/2024  END OF SESSION:  PT End of Session - 02/05/24 1527     Visit Number 13    Number of Visits 26    Date for PT Re-Evaluation 03/19/24    Authorization Type Medicare    Authorization - Visit Number 13    Progress Note Due on Visit 20    PT Start Time 1445    PT Stop Time 1530    PT Time Calculation (min) 45 min    Activity Tolerance Patient tolerated treatment well;Patient limited by pain    Behavior During Therapy [facility] for tasks assessed/performed                         Past Medical History:  Diagnosis Date   Abnormality of gait 04/23/2019   Acquired hypothyroidism 07/29/2021   Allergic rhinitis due to animal hair and dander 12/02/2015   Formatting of this note might be different from the original.  [facility]  Seen by Dr. Rockwell Germany 11/11/15     Plan- Received allergy shots today. Follow up 1 year.     Allergy    Anemia    Anemia    Anxiety    Arthritis of scaphoid-trapezium-trapezoid joint of right hand 09/10/2023   B12 deficiency    Back pain    Chronic fatigue 07/29/2021   Chronic fatigue syndrome    Chronic throat clearing 03/14/2023   Clotting disorder (HCC)    Constipation    Depression with anxiety 04/07/2009   Formatting of this note might be different from the original.  well controlled on current meds, follows with psychiatrist in [place]. Had a   h/o severe depression 5 years ago, and had ECT.     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  B12 deficiency   Iron  deficiency     Herniated lumbar intervertebral disc 11/12/2020   High cholesterol    History of blood clots    History of cervical spinal surgery 01/01/2018   Formatting of this note might be different from the original.  Fusion C6 and C7 per 2018 MRI report.     History of left hip replacement 01/14/2018   History of Roux-en-Y gastric bypass    Hoarseness 03/14/2023   Hyperlipidemia 08/19/2010   Hypertension    Hyponatremia 01/17/2018   Hypothyroidism    Hypothyroidism    IBS (irritable bowel syndrome) 04/06/2021   Impaired fasting glucose 08/19/2010   Incontinence 10/30/2013   Formatting of this note might be different from the original. Formatting of this note might be different from the original. Urinary Incontinence     Infertility, female    Insomnia 04/07/2009   Formatting of this note might be different from the original.  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  Followed by [facility]  Seen by Dr. Reginia Naas 03/01/15     S/p changing thoracic spinal cord stimulator generator 08/18/14.     Lumbar spine films show spinal cord generator in left flank area. 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  Follows with Rheumatology in [facility] of this note might be different from the original.  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  Getting iron infusion via port at hematalogy/oncology center at [facility].   Was recently hospitalized in [facility] for severe anemia     Formatting of this note might be different from the original.  on OTC b12 currently     Vitamin D deficiency    Past Surgical History:  Procedure Laterality Date   ABDOMINAL HYSTERECTOMY     APPENDECTOMY     CARPAL TUNNEL RELEASE Bilateral 2023    with thumb joint replacement   CERVICAL FUSION     CHOLECYSTECTOMY     FOOT SURGERY Bilateral    fusion   GASTRIC BYPASS     SPINAL CORD STIMULATOR INSERTION  07/2023   TOTAL HIP ARTHROPLASTY Bilateral    11/2016 & 05/2017   Patient Active Problem List   Diagnosis Date Noted   Palpitations 10/04/2023   Cardiac murmur 10/04/2023   Allergy    Anemia    Anxiety    B12 deficiency    Back pain    Chronic fatigue syndrome    Clotting disorder (HCC)    Constipation    Edema of both lower extremities  Fatty liver    Fibromyalgia    Gallbladder problem    High cholesterol    History of blood clots    History of Roux-en-Y gastric bypass    Hypothyroidism    Infertility, female    Joint pain    Kidney stones    Muscle disorder    Osteoarthritis    Prediabetes    Salzmann nodular degeneration    SOBOE (shortness of breath on exertion)    Swallowing difficulty    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   Arthritis of scaphoid-trapezium-trapezoid joint of right hand 09/10/2023   Urge incontinence 08/28/2023   OAB (overactive bladder) 08/28/2023   Lumbar post-laminectomy syndrome 07/06/2023   Snapping hip syndrome, 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   Other myositis, left thigh 04/24/2023   Sacroiliitis, not elsewhere classified (HCC) 04/24/2023   Dysphagia 04/17/2023   Elevated liver enzymes 04/02/2023   Elevated TSH 04/02/2023   Chronic throat clearing 03/14/2023   Globus sensation 03/14/2023   Hoarseness 03/14/2023   Muscle tension dysphonia 03/12/2023   Unspecified inflammatory spondylopathy, 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   Primary osteoarthritis of both knees 12/21/2021   Port-A-Cath in place 07/29/2021   Acquired hypothyroidism 07/29/2021   Chronic fatigue 07/29/2021   Vitamin B12 deficiency 07/29/2021   Vitamin D deficiency 07/29/2021   Postmenopausal estrogen deficiency 07/29/2021   IBS (irritable bowel syndrome) 04/06/2021   Retention of urine 04/06/2021    Herniated lumbar intervertebral disc 11/12/2020   Skin sensation disturbance 11/12/2020   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   Spondylopathy, unspecified 11/12/2020   Trochanteric bursitis of both hips 11/19/2019   Low serum iron 04/23/2019   Osteomalacia 04/23/2019   Abnormality of gait 04/23/2019   History of bilateral hip arthroplasty 06/26/2018   Gastroesophageal reflux disease 06/18/2018   Hypertension 06/18/2018   Intertrigo 04/30/2018   Lentigines 04/30/2018   Hyponatremia 01/17/2018   History of cervical spinal surgery 01/01/2018   Obesity (BMI 30-39.9) 01/01/2018   SK (seborrheic keratosis) 04/18/2017   Pain management contract agreement 11/17/2016   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   Allergic rhinitis due to animal hair and dander 12/02/2015   Low back pain 03/03/2015   Incontinence 10/30/2013   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   Neutrophilic leukocytosis 08/12/2012   Thrombocytosis 08/12/2012   Osteoporosis, senile 11/15/2010   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   Hyperlipidemia 08/19/2010   Impaired fasting glucose 08/19/2010   Polyarthritis 09/28/2009   Degenerative disc disease 04/07/2009   Depression with anxiety 04/07/2009   Myopathy 04/07/2009   Insomnia 04/07/2009   Migraine 04/07/2009   Scoliosis 04/07/2009   Seasonal allergies 04/07/2009   Vitamin B12 deficiency anemia 04/07/2009    PCP: Christen Butter  REFERRING PROVIDER: Verdell Carmine DIAG: bilateral trochanteric bursitis, vertigo  THERAPY DIAG:  Bilateral hip pain  Muscle weakness (generalized)  Dizziness and giddiness  Rationale for Evaluation and Treatment: Rehabilitation  ONSET DATE: 04/2023  SUBJECTIVE:   SUBJECTIVE STATEMENT: Pt continues to report increased Rt hip pain and more difficulty with transfers. She does report that her dizziness is not as bad and she has been doing her vestibular exercises    PERTINENT HISTORY: Bilat THA 2018, fibromyalgia, OA, spinal cord  stimulator, bilat TC joint fusion  Pt had bilateral hip replacements in 2018. Pt began having Lt hip and groin pain last summer that was so severe she went to ED. She was eventually diagnosed with bursitis. Since that time the pain has continued and is "always there". She now has bilateral hip pain, groin  pain. Hips are tender to touch. Pain increases with laying on her sides, pain with sit to stand, prolonged standing and walking. Pt has not found a way to relieve pain  Vestibular eval: pt states dizziness has been going on for "quite a while". Pt states she feels "room is spinning" when she goes from sit to stand. She reports she also feels that sensation when she goes from supine to sit but it is not as severe. She also states she veers to the right while walking. She also states that if she is standing still she will occasionally feel like she is "tipping over" to the right. The sensation only lasts a few seconds. Pt states she has had a cardiac work up which ruled out cardiac cause of dizziness PAIN:  Are you having pain? Yes: NPRS scale: 7/10 currently, 8-9/10 at worst Pain location: bilat hips Lt > Rt Pain description: sore, tender Aggravating factors: sitting in recliner, laying on sides, sit to stand Relieving factors: unknown  PRECAUTIONS: pt has spinal cord stimulator  RED FLAGS: None   WEIGHT BEARING RESTRICTIONS: No  FALLS:  Has patient fallen in last 6 months? No  OCCUPATION: retired  PLOF: Independent  PATIENT GOALS: reduce pain  NEXT MD VISIT: February 2025  OBJECTIVE:  Note: Objective measures were completed at Evaluation unless otherwise noted.  DIAGNOSTIC FINDINGS: x ray performed but not read at time as eval  PATIENT SURVEYS:  FOTO 39   MUSCLE LENGTH: Hamstrings: decreased bilat Hip flexors: decreased bilat   PALPATION: TTP bilat hip flexors, ITB, glutes, HS Increased mm spasticity bilat hamstrings, glutes, quads  LOWER EXTREMITY ROM:  Active ROM  Right eval Left eval  Hip flexion    Hip extension    Hip abduction    Hip adduction    Hip internal rotation    Hip external rotation 30 50  Knee flexion    Knee extension    Ankle dorsiflexion    Ankle plantarflexion    Ankle inversion    Ankle eversion     (Blank rows = not tested)  LOWER EXTREMITY MMT:  MMT Right eval Left eval  Hip flexion 3+ 3  Hip extension 3 3  Hip abduction 3 3  Hip adduction    Hip internal rotation    Hip external rotation 3 3+  Knee flexion    Knee extension    Ankle dorsiflexion    Ankle plantarflexion    Ankle inversion    Ankle eversion     (Blank rows = not tested)   FUNCTIONAL TESTS:  5 times sit to stand: 18.85 seconds  VESTIBULAR ASSESSMENT:     SYMPTOM BEHAVIOR:  Subjective history: pt states dizziness has been going on for "quite a while". Pt states she feels "room is spinning" when she goes from sit to stand. She reports she also feels that sensation when she goes from supine to sit but it is not as severe. She also states she veers to the right while walking. She also states that if she is standing still she will occasionally feel like she is "tipping over" to the right. The sensation only lasts a few seconds.  Non-Vestibular symptoms:  none reported  Type of dizziness: Spinning/Vertigo  Frequency: multiple times a day  Duration: seconds  Aggravating factors: Induced by position change: supine to sit, sit to stand, and walking  Relieving factors: slow movements  Progression of symptoms: unchanged  OCULOMOTOR EXAM:  Ocular Alignment: normal  Ocular ROM: No  Limitations  Spontaneous Nystagmus: absent  Gaze-Induced Nystagmus: age appropriate nystagmus at end range  Smooth Pursuits: saccades and pt reports "spinning" sensation when focusing on pen  Saccades: intact     VESTIBULAR - OCULAR REFLEX:   Slow VOR: Positive Bilaterally increased spinning sensation  VOR Cancellation: Comment: increased spinning  sensation  Head-Impulse Test: HIT Right: positive HIT Left: negative     POSITIONAL TESTING: Right Dix-Hallpike: no nystagmus Left Dix-Hallpike: no nystagmus Right Roll Test: no nystagmus Left Roll Test: no nystagmus   MOTION SENSITIVITY:    Motion Sensitivity Quotient Intensity: 0 = none, 1 = Lightheaded, 2 = Mild, 3 = Moderate, 4 = Severe, 5 = Vomiting  Intensity  1. Sitting to supine   2. Supine to L side   3. Supine to R side   4. Supine to sitting   5. L Hallpike-Dix   6. Up from L    7. R Hallpike-Dix   8. Up from R    9. Sitting, head  tipped to L knee   10. Head up from L  knee   11. Sitting, head  tipped to R knee   12. Head up from R  knee   13. Sitting head turns x5   14.Sitting head nods x5   15. In stance, 180  turn to L    16. In stance, 180  turn to R      [facility] Adult PT Treatment:                                                DATE: 02/05/24 Therapeutic Exercise/Activity/NMR: VOR with cancelllation vertical and horizontal. Standing with plain background Attempted VOR with cancellation with gait - requires mod A to correct LOB Standing on trampoline with UE support VOR x 1 vertical and horizontal VOR x 1 with marching - pt unable due to LOB VOR x 1 vertical and horizontal with marching with UE support on counter Pt unable to perform sit <> stand without UE support today  --> 5 x STS with UE support with only mild increase in dizziness  Self Care: Pt education on hip anatomy and activity modifications due to increased Rt pain  [facility] Adult PT Treatment:                                                DATE: 02/01/24 Therapeutic Exercise/Activity/NMR: Seated hip IR with ball as fulcrum Lt x 10, Rt x 2 - limited by pain Standing marching with countertop support with head turns, head nods Standing on airex head nods, turns ---> marching on airex head nods, turns VOR with cancelllation vertical and horizontal. Standing with plain background Standing on  airex tandem stance 2 x 30 sec bilat Attempted rocker board tapping - unable due to Rt hip pain Rocker board static hold laterally/vertically 2 x 30 sec   [facility] Adult PT Treatment:                                                DATE: 01/29/24 Therapeutic Exercise/Activity/NMR: Long sitting SLR on Lt x  10, Rt unable Figure 4 isometrics Rt LE Attempted lunge stretch for Rt adductors - pt unable to feel stretch Pigeon pose stretch for Rt adductors - good response DGI performed: 18/24 Standing marching with countertop support with head turns, head nods Standing march with countertop support with VOR x 1 horizontal Discussed activity modifications for increased hip pain                                                                                                                           PATIENT EDUCATION:  Education details: PT POC and goals, HEP Person educated: Patient Education method: Explanation, Demonstration, and Handouts Education comprehension: verbalized understanding and returned demonstration  HOME EXERCISE PROGRAM: Access Code: [email or web address] URL: [email or web address] Date: 02/01/2024 Prepared by: Reggy Eye  Exercises - Sit to Stand  - 1 x daily - 7 x weekly - 2 sets - 10 reps - Seated Hamstring Stretch  - 1 x daily - 7 x weekly - 1 sets - 3 reps - 20-30 seconds hold - Seated Figure 4 Piriformis Stretch  - 1 x daily - 7 x weekly - 1 sets - 3 reps - 20-30 seconds hold - Standing Hip Abduction with Bent Knee  - 1 x daily - 7 x weekly - 3 sets - 10 reps - Standing Hip Extension with Leg Bent and Support  - 1 x daily - 7 x weekly - 3 sets - 10 reps - Single Leg 3 Way Reach with Support  - 1 x daily - 7 x weekly - 3 sets - 10 reps - Seated Hip Internal Rotation with Ball and Resistance  - 1 x daily - 7 x weekly - 3 sets - 10 reps - Seated Hip Flexor Stretch  - 1 x daily - 7 x weekly - 1 sets - 3 reps - 30 seconds hold - Resistance Pulldown with March  - 1 x  daily - 7 x weekly - 3 sets - 10 reps - Standing Bilateral Low Shoulder Row with Anchored Resistance  - 1 x daily - 7 x weekly - 3 sets - 10 reps - Standing Anti-Rotation Press with Anchored Resistance  - 1 x daily - 7 x weekly - 3 sets - 10 reps - Seated Gaze Stabilization with Head Nod  - 3 x daily - 7 x weekly - 3 sets - 10 reps - Seated Gaze Stabilization with Head Rotation  - 3 x daily - 7 x weekly - 3 sets - 10 reps - Seated VOR Cancellation  - 1 x daily - 7 x weekly - 3 sets - 10 reps  ASSESSMENT:  CLINICAL IMPRESSION: Pt continues to be limited by significant increase in Rt hip pain. Pt is attempting to schedule with MD due to increase in pain and decrease in strength. Pt is improving with vestibular treatment. Able to progress to marching with VOR this session. She is making progress towards vestibular goals but mobility is limited by Rt hip  pain  OBJECTIVE IMPAIRMENTS: decreased activity tolerance, decreased mobility, difficulty walking, decreased ROM, decreased strength, increased muscle spasms, impaired flexibility, and pain.     GOALS: Goals reviewed with patient? Yes  SHORT TERM GOALS: Target date: 01/09/2024   Pt will be independent with initial HEP Baseline: Goal status: MET  2.  Pt will improve FOTO to > = 45 to demo improving functional mobility Baseline: 33 on 01/11/24 Goal status: IN PROGRESS   LONG TERM GOALS: Target date: 03/19/2024      Pt will be independent with advanced HEP and plan for continued exercise in community Baseline:  Goal status: INITIAL  2.  Pt will improve FOTO to >= 50 to demo improved functional mobility Baseline:  Goal status: INITIAL  3.  Pt will improve hip strength to 4+/5 bilat to improve standing and walking tolerance Baseline:  Goal status: INITIAL  4.  Pt will tolerate laying on both sides with pain <= 2/10 Baseline:  Goal status: INITIAL  5.  Pt will improve 5 x STS to <= 15 seconds Baseline:  Goal status:  INITIAL  6.  Pt will report transitioning sit to stand without dizziness symptoms 75% of the time Baseline:  Goal status: INITIAL 7.  Pt will demo decreased risk of falls with DGI >= 21/24 Baseline: 18/24 Goal status: INITIAL       PLAN:  PT FREQUENCY: 2x/week  PT DURATION: 8 weeks  PLANNED INTERVENTIONS: 97164- PT Re-evaluation, 97110-Therapeutic exercises, 97530- Therapeutic activity, 97112- Neuromuscular re-education, 97535- Self Care, 16109- Manual therapy, [id number]- Aquatic Therapy, 97035- Ultrasound, [id number]- Ionotophoresis 4mg /ml Dexamethasone, Patient/Family education, Balance training, Taping, Dry Needling, Cryotherapy, and Moist heat  PLAN FOR NEXT SESSION: vestibular, balance, hip stability, hip strengthening   Obdulia Steier, PT 02/05/2024, 3:28 PM

## 2024-02-05 NOTE — Telephone Encounter (Signed)
 Copied from CRM [id number]. Topic: Clinical - Medical Advice >> Feb 04, 2024  2:26 PM [name] wrote: Reason for CRM: Patient would like medical advice regarding bone scan results. Another provider recommended she see an ortho surgeon as her next step. Patient would like to receive advice regarding next steps through Dr. Karie Schwalbe. Pain management states patient should not wait for test results and should schedule with an ortho surgeon ASAP. Please advise.

## 2024-02-06 ENCOUNTER — Ambulatory Visit: Admitting: Nurse Practitioner

## 2024-02-06 ENCOUNTER — Encounter: Payer: Self-pay | Admitting: Nurse Practitioner

## 2024-02-06 VITALS — BP 171/80 | HR 66 | Temp 97.8°F | Ht 64.0 in | Wt 190.0 lb

## 2024-02-06 DIAGNOSIS — Z6832 Body mass index (BMI) 32.0-32.9, adult: Secondary | ICD-10-CM | POA: Diagnosis not present

## 2024-02-06 DIAGNOSIS — E66811 Obesity, class 1: Secondary | ICD-10-CM

## 2024-02-06 DIAGNOSIS — R7303 Prediabetes: Secondary | ICD-10-CM

## 2024-02-06 DIAGNOSIS — E6609 Other obesity due to excess calories: Secondary | ICD-10-CM

## 2024-02-06 NOTE — Telephone Encounter (Signed)
 I would love to help but I do not have the bone scan results, see if you can call and have it moved up the list for reading.

## 2024-02-06 NOTE — Progress Notes (Unsigned)
 Office: [phone]  /  Fax: [phone]  WEIGHT SUMMARY AND BIOMETRICS  Weight Lost Since Last Visit: 0  Weight Gained Since Last Visit: 1   Vitals Temp: 97.8 F (36.6 C) BP: (!) 171/80 Pulse Rate: 66 SpO2: 100 %   Anthropometric Measurements Height: 5\' 4"  (1.626 m) Weight: 190 lb (86.2 kg) BMI (Calculated): 32.6 Weight at Last Visit: 189 lb Weight Lost Since Last Visit: 0 Weight Gained Since Last Visit: 1 Starting Weight: 178 lb Total Weight Loss (lbs): 0 lb (0 kg)   Body Composition  Body Fat %: 46.5 % Fat Mass (lbs): 88.8 lbs Muscle Mass (lbs): 96.8 lbs Total Body Water (lbs): 77.2 lbs Visceral Fat Rating : 14   Other Clinical Data Today's Visit #: 11 Starting Date: 03/27/23     HPI  Chief Complaint: OBESITY  Jaszmine is here to discuss her progress with her obesity treatment plan. She is on the the Category 2 Plan and states she is following her eating plan approximately 0 % of the time. She states she is walking a bit.   Interval History:  Since last office visit she has gained 1 pound. She reports eating when she feels like eating.  She states she cannot eat 3 meals per day.  She is not meeting calories, protein, etc goals daily.  She knows she is not eating enough.   I have referred her to RD but she states she can't go due to cost.     She saw Dr. Dewaine Conger last on 01/29/24 and has been referred to a therapist.    She's going to [place] in [place] and [place] to visit family.    Pharmacotherapy for weight loss: She is not currently taking any meds for medical weight loss.  Unable to take GLP-1s due to cost.   She is not a candidate for Phentermine or Qsymia due to age.   She is not a candidate for Contrave due to taking Wellbutrin.     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 Her highest weight was 267 lbs and nadir weight was 154 lbs. Her weight has fluctuated over the  years. Highest weight since having surgery was 216 lbs.   Prediabetes Last A1c was 6.1  Medication(s): none Polyphagia:No Lab Results  Component Value Date   HGBA1C 6.1 (A) 08/14/2023   HGBA1C 6.1 08/14/2023   HGBA1C 6.2 (H) 04/10/2023   HGBA1C 6.2 (H) 12/19/2022   HGBA1C 6.0 (H) 04/06/2022   Lab Results  Component Value Date   INSULIN 8.7 03/27/2023     PHYSICAL EXAM:  Blood pressure (!) 171/80, pulse 66, temperature 97.8 F (36.6 C), height 5\' 4"  (1.626 m), weight 190 lb (86.2 kg), SpO2 100%. Body mass index is 32.61 kg/m.  General: She is overweight, cooperative, alert, well developed, and in no acute distress. PSYCH: Has normal mood, affect and thought process.   Extremities: No edema.  Neurologic: No gross sensory or motor deficits. No tremors or fasciculations noted.    DIAGNOSTIC DATA REVIEWED:  BMET    Component Value Date/Time   NA 136 08/14/2023 1357   K 4.8 08/14/2023 1357   CL 95 (L) 08/14/2023 1357   CO2 26 08/14/2023 1357   GLUCOSE 101 (H) 08/14/2023 1357   GLUCOSE 92 02/20/2023 1128   BUN 17 08/14/2023 1357   CREATININE 0.68 08/14/2023 1357   CREATININE 0.80 02/20/2023 1128   CALCIUM 9.6  08/14/2023 1357   GFRNONAA >60 09/07/2021 1433   Lab Results  Component Value Date   HGBA1C 6.1 (A) 08/14/2023   HGBA1C 6.1 08/14/2023   HGBA1C 6.0 (H) 04/06/2022   Lab Results  Component Value Date   INSULIN 8.7 03/27/2023   Lab Results  Component Value Date   TSH 4.550 (H) 03/27/2023   CBC    Component Value Date/Time   WBC 5.6 01/07/2024 1315   WBC 4.7 12/19/2022 1157   RBC 3.85 (L) 01/07/2024 1315   RBC 3.82 (L) 01/07/2024 1315   HGB 11.5 (L) 01/07/2024 1315   HCT 35.9 (L) 01/07/2024 1315   PLT 242 01/07/2024 1315   MCV 93.2 01/07/2024 1315   MCH 29.9 01/07/2024 1315   MCHC 32.0 01/07/2024 1315   RDW 13.2 01/07/2024 1315   Iron Studies    Component Value Date/Time   IRON 58 01/07/2024 1315   TIBC 417 01/07/2024 1315   FERRITIN 93  01/07/2024 1315   IRONPCTSAT 14 01/07/2024 1315   IRONPCTSAT 25 12/19/2022 1157   Lipid Panel     Component Value Date/Time   CHOL 213 (H) 02/20/2023 1128   TRIG 106 02/20/2023 1128   HDL 89 02/20/2023 1128   CHOLHDL 2.4 02/20/2023 1128   LDLCALC 104 (H) 02/20/2023 1128   Hepatic Function Panel     Component Value Date/Time   PROT 6.9 08/14/2023 1357   ALBUMIN 4.2 08/14/2023 1357   AST 30 08/14/2023 1357   AST 30 09/07/2021 1433   ALT 29 08/14/2023 1357   ALT 31 09/07/2021 1433   ALKPHOS 107 08/14/2023 1357   BILITOT 0.4 08/14/2023 1357   BILITOT 0.3 09/07/2021 1433   BILIDIR 0.12 04/10/2023 1624      Component Value Date/Time   TSH 4.550 (H) 03/27/2023 0957   Nutritional Lab Results  Component Value Date   VD25OH 46.8 03/27/2023   VD25OH 78 07/10/2022     ASSESSMENT AND PLAN  TREATMENT PLAN FOR OBESITY:  Recommended Dietary Goals  Ottie is currently in the action stage of change. As such, her goal is to continue weight management plan. She has agreed to the Category 2 Plan.  Behavioral Intervention  We discussed the following Behavioral Modification Strategies today: increasing lean protein intake to established goals, decreasing simple carbohydrates , increasing vegetables, avoiding skipping meals, work on meal planning and preparation, reading food labels , keeping healthy foods at home, and continue to work on maintaining a reduced calorie state, getting the recommended amount of protein, incorporating whole foods, making healthy choices, staying well hydrated and practicing mindfulness when eating..  Additional resources provided today: NA  Recommended Physical Activity Goals  Elise has been advised to work up to 150 minutes of moderate intensity aerobic activity a week and strengthening exercises 2-3 times per week for cardiovascular health, weight loss maintenance and preservation of muscle mass.   She has agreed to Think about enjoyable ways to  increase daily physical activity and overcoming barriers to exercise, Increase physical activity in their day and reduce sedentary time (increase NEAT)., Increase the intensity, frequency or duration of strengthening exercises , and Increase the intensity, frequency or duration of aerobic exercises     Pharmacotherapy She is not a candidate for anti obesity medications due to her eating habits/pattern. She is not eating enough calories, protein, etc.  She would benefit from seeing a therapist for eating disorder and seeing RD but can't due to cost. Has been referred for therapy by Dr. Dewaine Conger.  ASSOCIATED CONDITIONS ADDRESSED TODAY  Action/Plan  Prediabetes To continue following up with PCP  Class 1 obesity due to excess calories without serious comorbidity with body mass index (BMI) of 32.0 to 32.9 in adult      Keep appt with PCP on 02/13/24 Has been referred for therapy by Dr. Kendall Flack to make appt and keep appt   No follow-ups on file.Marland Kitchen She was informed of the importance of frequent follow up visits to maximize her success with intensive lifestyle modifications for her multiple health conditions.   ATTESTASTION STATEMENTS:  Reviewed by clinician on day of visit: allergies, medications, problem list, medical history, surgical history, family history, social history, and previous encounter notes.   Time spent on visit including pre-visit chart review and post-visit care and charting was 30 minutes.    Theodis Sato. Deklyn Trachtenberg FNP-C

## 2024-02-07 ENCOUNTER — Ambulatory Visit (HOSPITAL_COMMUNITY)
Admission: RE | Admit: 2024-02-07 | Discharge: 2024-02-07 | Disposition: A | Discharge status: Home / Self Care | Source: Ambulatory Visit | Attending: Neurology | Admitting: Neurology

## 2024-02-07 ENCOUNTER — Telehealth: Payer: Self-pay | Admitting: Nurse Practitioner

## 2024-02-07 ENCOUNTER — Telehealth: Payer: Self-pay

## 2024-02-07 DIAGNOSIS — H8111 Benign paroxysmal vertigo, right ear: Secondary | ICD-10-CM | POA: Insufficient documentation

## 2024-02-07 NOTE — Telephone Encounter (Signed)
 error

## 2024-02-08 ENCOUNTER — Other Ambulatory Visit: Payer: Self-pay | Admitting: Neurology

## 2024-02-08 ENCOUNTER — Ambulatory Visit: Admitting: Physical Therapy

## 2024-02-08 ENCOUNTER — Encounter: Payer: Self-pay | Admitting: Neurology

## 2024-02-08 DIAGNOSIS — H8111 Benign paroxysmal vertigo, right ear: Secondary | ICD-10-CM

## 2024-02-08 NOTE — Telephone Encounter (Signed)
 [facility] Radiology.

## 2024-02-11 ENCOUNTER — Ambulatory Visit (INDEPENDENT_AMBULATORY_CARE_PROVIDER_SITE_OTHER): Admitting: Sports Medicine

## 2024-02-11 DIAGNOSIS — M961 Postlaminectomy syndrome, not elsewhere classified: Secondary | ICD-10-CM | POA: Diagnosis not present

## 2024-02-11 DIAGNOSIS — Z96643 Presence of artificial hip joint, bilateral: Secondary | ICD-10-CM

## 2024-02-11 NOTE — Assessment & Plan Note (Signed)
 Olegario Messier also has a history of lumbar spinal stenosis status post lumbar laminectomy, she has been working with Dr. Laurian Brim, she has a spinal cord stimulator, we added physical therapy and gabapentin. The above has helped to some degree. She also improved significantly with the addition of Celebrex and an SNRI. At the last visit we increased gabapentin to 600 mg 3 times daily, she has had slight improvements. As I do suspect some of her hip pain is referred from her back she will work with Dr. Jon Gills office, they are ordering a new MRI and I think are planning epidurals and potentially facet MBB/RFA. We will be a little bit hands off with regards to her back so that we do not get too many cooks in the kitchen.

## 2024-02-11 NOTE — Assessment & Plan Note (Signed)
 Heather Thompson returns, she is a pleasant 73 year old female, chronic bilateral hip pain, initially laterally but then anterior pain as well. She has a history of bilateral hip arthroplasties. X-rays historically have not shown any signs of aseptic loosening. Physical therapy has resulted in some improvement in the lateral pain but not the anterior pain. Differential includes referred pain from the back as well as complication of the arthroplasty. We proceeded with a three-phase bone scan which rules out aseptic loosening taking the arthroplasty out of the picture as a potential pain generator. Today she has some difficulty with flexion of the right hip, she complains of both pain and weakness, passively I can get the hip to flex fully For this reason we will proceed with additional aggressive physical therapy focusing on her right hip flexors and iliopsoas. She is also going to be working with Dr. Jon Gills office regarding the lumbar spondylitic component of her pain.

## 2024-02-11 NOTE — Progress Notes (Signed)
    Procedures performed today:    None.  Independent interpretation of notes and tests performed by another provider:   None.  Brief History, Exam, Impression, and Recommendations:    History of bilateral hip arthroplasty Heather Thompson returns, she is a pleasant 73 year old female, chronic bilateral hip pain, initially laterally but then anterior pain as well. She has a history of bilateral hip arthroplasties. X-rays historically have not shown any signs of aseptic loosening. Physical therapy has resulted in some improvement in the lateral pain but not the anterior pain. Differential includes referred pain from the back as well as complication of the arthroplasty. We proceeded with a three-phase bone scan which rules out aseptic loosening taking the arthroplasty out of the picture as a potential pain generator. Today she has some difficulty with flexion of the right hip, she complains of both pain and weakness, passively I can get the hip to flex fully For this reason we will proceed with additional aggressive physical therapy focusing on her right hip flexors and iliopsoas. She is also going to be working with Dr. Jon Gills office regarding the lumbar spondylitic component of her pain.  Lumbar post-laminectomy syndrome Heather Thompson also has a history of lumbar spinal stenosis status post lumbar laminectomy, she has been working with Dr. Laurian Brim, she has a spinal cord stimulator, we added physical therapy and gabapentin. The above has helped to some degree. She also improved significantly with the addition of Celebrex and an SNRI. At the last visit we increased gabapentin to 600 mg 3 times daily, she has had slight improvements. As I do suspect some of her hip pain is referred from her back she will work with Dr. Jon Gills office, they are ordering a new MRI and I think are planning epidurals and potentially facet MBB/RFA. We will be a little bit hands off with regards to her back so that we do not  get too many cooks in the kitchen.    ____________________________________________ Heather Thompson. Heather Thompson, M.D., ABFM., CAQSM., AME. Primary Care and Sports Medicine [place] [facility]  Adjunct Professor of Family Medicine  [employer]  Restaurant manager, fast food

## 2024-02-12 ENCOUNTER — Encounter: Payer: Self-pay | Admitting: Physical Therapy

## 2024-02-12 ENCOUNTER — Ambulatory Visit: Admitting: Physical Therapy

## 2024-02-12 DIAGNOSIS — R42 Dizziness and giddiness: Secondary | ICD-10-CM

## 2024-02-12 DIAGNOSIS — M25551 Pain in right hip: Secondary | ICD-10-CM

## 2024-02-12 DIAGNOSIS — M6281 Muscle weakness (generalized): Secondary | ICD-10-CM

## 2024-02-12 NOTE — Therapy (Signed)
 OUTPATIENT PHYSICAL THERAPY LOWER EXTREMITY and VESTIBULAR   Patient Name: Heather Thompson MRN: [id number] DOB:08-26-1951, 73 y.o., female Today's Date: 02/12/2024  END OF SESSION:  PT End of Session - 02/12/24 1530     Visit Number 14    Number of Visits 26    Date for PT Re-Evaluation 03/19/24    Authorization Type Medicare    Authorization - Visit Number 14    Progress Note Due on Visit 20    PT Start Time 1445    PT Stop Time 1526    PT Time Calculation (min) 41 min    Activity Tolerance Patient tolerated treatment well;Patient limited by pain    Behavior During Therapy [facility] for tasks assessed/performed                          Past Medical History:  Diagnosis Date   Abnormality of gait 04/23/2019   Acquired hypothyroidism 07/29/2021   Allergic rhinitis due to animal hair and dander 12/02/2015   Formatting of this note might be different from the original.  [facility]  Seen by Dr. Rockwell Germany 11/11/15     Plan- Received allergy shots today. Follow up 1 year.     Allergy    Anemia    Anemia    Anxiety    Arthritis of scaphoid-trapezium-trapezoid joint of right hand 09/10/2023   B12 deficiency    Back pain    Chronic fatigue 07/29/2021   Chronic fatigue syndrome    Chronic throat clearing 03/14/2023   Clotting disorder (HCC)    Constipation    Depression with anxiety 04/07/2009   Formatting of this note might be different from the original.  well controlled on current meds, follows with psychiatrist in [place]. Had a   h/o severe depression 5 years ago, and had ECT.     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  B12 deficiency    Iron deficiency     Herniated lumbar intervertebral disc 11/12/2020   High cholesterol    History of blood clots    History of cervical spinal surgery 01/01/2018   Formatting of this note might be different from the original.  Fusion C6 and C7 per 2018 MRI report.     History of left hip replacement 01/14/2018   History of Roux-en-Y gastric bypass    Hoarseness 03/14/2023   Hyperlipidemia 08/19/2010   Hypertension    Hyponatremia 01/17/2018   Hypothyroidism    Hypothyroidism    IBS (irritable bowel syndrome) 04/06/2021   Impaired fasting glucose 08/19/2010   Incontinence 10/30/2013   Formatting of this note might be different from the original. Formatting of this note might be different from the original. Urinary Incontinence     Infertility, female    Insomnia 04/07/2009   Formatting of this note might be different from the original.  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  Followed by [facility]  Seen by Dr. Reginia Naas 03/01/15     S/p changing thoracic spinal cord stimulator generator 08/18/14.     Lumbar spine films show spinal cord generator in left flank area. 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  Follows with Rheumatology in [facility] of this note might be different from the original.  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  Getting iron infusion via port at hematalogy/oncology center at [facility].   Was recently hospitalized in [facility] for severe anemia     Formatting of this note might be different from the original.  on OTC b12 currently     Vitamin D deficiency    Past Surgical History:  Procedure Laterality Date   ABDOMINAL HYSTERECTOMY     APPENDECTOMY     CARPAL TUNNEL RELEASE  Bilateral 2023   with thumb joint replacement   CERVICAL FUSION     CHOLECYSTECTOMY     FOOT SURGERY Bilateral    fusion   GASTRIC BYPASS     SPINAL CORD STIMULATOR INSERTION  07/2023   TOTAL HIP ARTHROPLASTY Bilateral    11/2016 & 05/2017   Patient Active Problem List   Diagnosis Date Noted   Palpitations 10/04/2023   Cardiac murmur 10/04/2023   Allergy    Anemia    Anxiety    B12 deficiency    Back pain    Chronic fatigue syndrome    Clotting disorder (HCC)    Constipation    Edema of both  lower extremities    Fatty liver    Fibromyalgia    Gallbladder problem    High cholesterol    History of blood clots    History of Roux-en-Y gastric bypass    Hypothyroidism    Infertility, female    Joint pain    Kidney stones    Muscle disorder    Osteoarthritis    Prediabetes    Salzmann nodular degeneration    SOBOE (shortness of breath on exertion)    Swallowing difficulty    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   Arthritis of scaphoid-trapezium-trapezoid joint of right hand 09/10/2023   Urge incontinence 08/28/2023   OAB (overactive bladder) 08/28/2023   Lumbar post-laminectomy syndrome 07/06/2023   Snapping hip syndrome, 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   Other myositis, left thigh 04/24/2023   Sacroiliitis, not elsewhere classified (HCC) 04/24/2023   Dysphagia 04/17/2023   Elevated liver enzymes 04/02/2023   Elevated TSH 04/02/2023   Chronic throat clearing 03/14/2023   Globus sensation 03/14/2023   Hoarseness 03/14/2023   Muscle tension dysphonia 03/12/2023   Unspecified inflammatory spondylopathy, 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   Primary osteoarthritis of both knees 12/21/2021   Port-A-Cath in place 07/29/2021   Acquired hypothyroidism 07/29/2021   Chronic fatigue 07/29/2021   Vitamin B12 deficiency 07/29/2021   Vitamin D deficiency 07/29/2021   Postmenopausal estrogen deficiency 07/29/2021   IBS (irritable bowel syndrome) 04/06/2021   Retention of urine  04/06/2021   Herniated lumbar intervertebral disc 11/12/2020   Skin sensation disturbance 11/12/2020   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   Spondylopathy, unspecified 11/12/2020   Trochanteric bursitis of both hips 11/19/2019   Low serum iron 04/23/2019   Osteomalacia 04/23/2019   Abnormality of gait 04/23/2019   History of bilateral hip arthroplasty 06/26/2018   Gastroesophageal reflux disease 06/18/2018   Hypertension 06/18/2018   Intertrigo 04/30/2018   Lentigines 04/30/2018   Hyponatremia 01/17/2018   History of cervical spinal surgery 01/01/2018   Obesity (BMI 30-39.9) 01/01/2018   SK (seborrheic keratosis) 04/18/2017   Pain management contract agreement 11/17/2016   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   Allergic rhinitis due to animal hair and dander 12/02/2015   Low back pain 03/03/2015   Incontinence 10/30/2013   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   Neutrophilic leukocytosis 08/12/2012   Thrombocytosis 08/12/2012   Osteoporosis, senile 11/15/2010   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   Hyperlipidemia 08/19/2010   Impaired fasting glucose 08/19/2010   Polyarthritis 09/28/2009   Degenerative disc disease 04/07/2009   Depression with anxiety 04/07/2009   Myopathy 04/07/2009   Insomnia 04/07/2009   Migraine 04/07/2009   Scoliosis 04/07/2009   Seasonal allergies 04/07/2009   Vitamin B12 deficiency anemia 04/07/2009    PCP: Christen Butter  REFERRING PROVIDER: Verdell Carmine DIAG: bilateral trochanteric bursitis, vertigo  THERAPY DIAG:  Bilateral hip pain  Muscle weakness (generalized)  Dizziness and giddiness  Rationale for Evaluation and Treatment: Rehabilitation  ONSET DATE: 04/2023  SUBJECTIVE:   SUBJECTIVE STATEMENT: Pt reports she had results of bone scan and states there is no issue with her hip joint. 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     PERTINENT HISTORY: Bilat THA 2018, fibromyalgia, OA, spinal cord  stimulator, bilat TC joint fusion  Pt had bilateral hip replacements in 2018. Pt began having Lt hip and groin pain last summer that was so severe she went to ED. She was eventually diagnosed with bursitis. Since that time the pain has continued and is "always there". She now has  bilateral hip pain, groin pain. Hips are tender to touch. Pain increases with laying on her sides, pain with sit to stand, prolonged standing and walking. Pt has not found a way to relieve pain  Vestibular eval: pt states dizziness has been going on for "quite a while". Pt states she feels "room is spinning" when she goes from sit to stand. She reports she also feels that sensation when she goes from supine to sit but it is not as severe. She also states she veers to the right while walking. She also states that if she is standing still she will occasionally feel like she is "tipping over" to the right. The sensation only lasts a few seconds. Pt states she has had a cardiac work up which ruled out cardiac cause of dizziness PAIN:  Are you having pain? Yes: NPRS scale: 7/10 currently, 8-9/10 at worst Pain location: bilat hips Lt > Rt Pain description: sore, tender Aggravating factors: sitting in recliner, laying on sides, sit to stand Relieving factors: unknown  PRECAUTIONS: pt has spinal cord stimulator  RED FLAGS: None   WEIGHT BEARING RESTRICTIONS: No  FALLS:  Has patient fallen in last 6 months? No  OCCUPATION: retired  PLOF: Independent  PATIENT GOALS: reduce pain  NEXT MD VISIT: February 2025  OBJECTIVE:  Note: Objective measures were completed at Evaluation unless otherwise noted.  DIAGNOSTIC FINDINGS: x ray performed but not read at time as eval  PATIENT SURVEYS:  FOTO 39   MUSCLE LENGTH: Hamstrings: decreased bilat Hip flexors: decreased bilat   PALPATION: TTP bilat hip flexors, ITB, glutes, HS Increased mm spasticity bilat hamstrings, glutes, quads  LOWER EXTREMITY ROM:  Active ROM  Right eval Left eval  Hip flexion    Hip extension    Hip abduction    Hip adduction    Hip internal rotation    Hip external rotation 30 50  Knee flexion    Knee extension    Ankle dorsiflexion    Ankle plantarflexion    Ankle inversion    Ankle eversion     (Blank rows = not tested)  LOWER EXTREMITY MMT:  MMT Right eval Left eval  Hip flexion 3+ 3  Hip extension 3 3  Hip abduction 3 3  Hip adduction    Hip internal rotation    Hip external rotation 3 3+  Knee flexion    Knee extension    Ankle dorsiflexion    Ankle plantarflexion    Ankle inversion    Ankle eversion     (Blank rows = not tested)   FUNCTIONAL TESTS:  5 times sit to stand: 18.85 seconds  VESTIBULAR ASSESSMENT:     SYMPTOM BEHAVIOR:  Subjective history: pt states dizziness has been going on for "quite a while". Pt states she feels "room is spinning" when she goes from sit to stand. She reports she also feels that sensation when she goes from supine to sit but it is not as severe. She also states she veers to the right while walking. She also states that if she is standing still she will occasionally feel like she is "tipping over" to the right. The sensation only lasts a few seconds.  Non-Vestibular symptoms:  none reported  Type of dizziness: Spinning/Vertigo  Frequency: multiple times a day  Duration: seconds  Aggravating factors: Induced by position change: supine to sit, sit to stand, and walking  Relieving factors: slow movements  Progression of symptoms: unchanged  OCULOMOTOR EXAM:  Ocular Alignment: normal  Ocular ROM: No Limitations  Spontaneous Nystagmus: absent  Gaze-Induced Nystagmus: age appropriate nystagmus at end range  Smooth Pursuits: saccades and pt reports "spinning" sensation when focusing on pen  Saccades: intact     VESTIBULAR - OCULAR REFLEX:   Slow VOR: Positive Bilaterally increased spinning sensation  VOR Cancellation: Comment: increased spinning  sensation  Head-Impulse Test: HIT Right: positive HIT Left: negative     POSITIONAL TESTING: Right Dix-Hallpike: no nystagmus Left Dix-Hallpike: no nystagmus Right Roll Test: no nystagmus Left Roll Test: no nystagmus   MOTION SENSITIVITY:    Motion Sensitivity Quotient Intensity: 0 = none, 1 = Lightheaded, 2 = Mild, 3 = Moderate, 4 = Severe, 5 = Vomiting  Intensity  1. Sitting to supine   2. Supine to L side   3. Supine to R side   4. Supine to sitting   5. L Hallpike-Dix   6. Up from L    7. R Hallpike-Dix   8. Up from R    9. Sitting, head  tipped to L knee   10. Head up from L  knee   11. Sitting, head  tipped to R knee   12. Head up from R  knee   13. Sitting head turns x5   14.Sitting head nods x5   15. In stance, 180  turn to L    16. In stance, 180  turn to R      [facility] Adult PT Treatment:                                                DATE: 02/12/24 Therapeutic Exercise/Activity/NMR: Long sitting Quad set (submax) 2 x 10 Standing hip flexion submax isometric ball on wall x 12 Attempted hip abd submax isometric - unable due to pain TKE ball on wall submax contraction Rt x 12 Hip IR with ball squeeze in tolerable range on Rt x 10 Standing hip ext with knee bent in tolerable range x 8 on Rt SLS on Rt 2 x 30 sec intermittent UE support VOR x 1 vertical and horizontal with marching with UE support on counter  Self Care: Review of HEP, prognosis, review of relevant anatomy and rationale for treatment  [facility] Adult PT Treatment:                                                DATE: 02/05/24 Therapeutic Exercise/Activity/NMR: VOR with cancelllation vertical and horizontal. Standing with plain background Attempted VOR with cancellation with gait - requires mod A to correct LOB Standing on trampoline with UE support VOR x 1 vertical and horizontal VOR x 1 with marching - pt unable due to LOB VOR x 1 vertical and horizontal with marching with UE support on  counter Pt unable to perform sit <> stand without UE support today  --> 5 x STS with UE support with only mild increase in dizziness  Self Care: Pt education on hip anatomy and activity modifications due to increased Rt pain  [facility] Adult PT Treatment:  DATE: 02/01/24 Therapeutic Exercise/Activity/NMR: Seated hip IR with ball as fulcrum Lt x 10, Rt x 2 - limited by pain Standing marching with countertop support with head turns, head nods Standing on airex head nods, turns ---> marching on airex head nods, turns VOR with cancelllation vertical and horizontal. Standing with plain background Standing on airex tandem stance 2 x 30 sec bilat Attempted rocker board tapping - unable due to Rt hip pain Rocker board static hold laterally/vertically 2 x 30 sec                                                                                                                          PATIENT EDUCATION:  Education details: PT POC and goals, HEP Person educated: Patient Education method: Explanation, Demonstration, and Handouts Education comprehension: verbalized understanding and returned demonstration  HOME EXERCISE PROGRAM: Access Code: [email or web address] URL: [email or web address] Date: 02/12/2024 Prepared by: Reggy Eye  Exercises - Sit to Stand  - 1 x daily - 7 x weekly - 2 sets - 10 reps - Seated Hamstring Stretch  - 1 x daily - 7 x weekly - 1 sets - 3 reps - 20-30 seconds hold - Seated Figure 4 Piriformis Stretch  - 1 x daily - 7 x weekly - 1 sets - 3 reps - 20-30 seconds hold - Standing Hip Abduction with Bent Knee  - 1 x daily - 7 x weekly - 3 sets - 10 reps - Standing Hip Extension with Leg Bent and Support  - 1 x daily - 7 x weekly - 3 sets - 10 reps - Single Leg 3 Way Reach with Support  - 1 x daily - 7 x weekly - 3 sets - 10 reps - Seated Hip Internal Rotation with Ball and Resistance  - 1 x daily - 7 x weekly - 3 sets - 10 reps -  Seated Hip Flexor Stretch  - 1 x daily - 7 x weekly - 1 sets - 3 reps - 30 seconds hold - Resistance Pulldown with March  - 1 x daily - 7 x weekly - 3 sets - 10 reps - Standing Bilateral Low Shoulder Row with Anchored Resistance  - 1 x daily - 7 x weekly - 3 sets - 10 reps - Standing Anti-Rotation Press with Anchored Resistance  - 1 x daily - 7 x weekly - 3 sets - 10 reps - Seated Gaze Stabilization with Head Nod  - 3 x daily - 7 x weekly - 3 sets - 10 reps - Seated Gaze Stabilization with Head Rotation  - 3 x daily - 7 x weekly - 3 sets - 10 reps - Seated VOR Cancellation  - 1 x daily - 7 x weekly - 3 sets - 10 reps - Supine Quad Set  - 1 x daily - 7 x weekly - 2 sets - 10 reps - Standing Isometric Hip Flexion with Ball at Wall  - 1 x daily - 7 x weekly -  2 sets - 10 reps - Single Leg Stance with Support  - 1 x daily - 7 x weekly - 1 sets - 3 reps - 20-30 seconds hold  ASSESSMENT:  CLINICAL IMPRESSION: Session focused on improving hip flexor strength in tolerable range. Updated HEP to address ongoing deficits. Encouraged pt to continue vestibular exercises as she is still challenged by marching with VOR  OBJECTIVE IMPAIRMENTS: decreased activity tolerance, decreased mobility, difficulty walking, decreased ROM, decreased strength, increased muscle spasms, impaired flexibility, and pain.     GOALS: Goals reviewed with patient? Yes  SHORT TERM GOALS: Target date: 01/09/2024   Pt will be independent with initial HEP Baseline: Goal status: MET  2.  Pt will improve FOTO to > = 45 to demo improving functional mobility Baseline: 33 on 01/11/24 Goal status: IN PROGRESS   LONG TERM GOALS: Target date: 03/19/2024      Pt will be independent with advanced HEP and plan for continued exercise in community Baseline:  Goal status: INITIAL  2.  Pt will improve FOTO to >= 50 to demo improved functional mobility Baseline:  Goal status: INITIAL  3.  Pt will improve hip strength to 4+/5  bilat to improve standing and walking tolerance Baseline:  Goal status: INITIAL  4.  Pt will tolerate laying on both sides with pain <= 2/10 Baseline:  Goal status: INITIAL  5.  Pt will improve 5 x STS to <= 15 seconds Baseline:  Goal status: INITIAL  6.  Pt will report transitioning sit to stand without dizziness symptoms 75% of the time Baseline:  Goal status: INITIAL 7.  Pt will demo decreased risk of falls with DGI >= 21/24 Baseline: 18/24 Goal status: INITIAL       PLAN:  PT FREQUENCY: 2x/week  PT DURATION: 8 weeks  PLANNED INTERVENTIONS: [id number]- PT Re-evaluation, 97110-Therapeutic exercises, 97530- Therapeutic activity, 97112- Neuromuscular re-education, 97535- Self Care, [id number]- Manual therapy, [id number]- Aquatic Therapy, 97035- Ultrasound, [id number]- Ionotophoresis 4mg /ml Dexamethasone, Patient/Family education, Balance training, Taping, Dry Needling, Cryotherapy, and Moist heat  PLAN FOR NEXT SESSION: vestibular, balance, hip stability, hip strengthening   Shaylon Gillean, PT 02/12/2024, 3:31 PM

## 2024-02-13 ENCOUNTER — Encounter: Payer: Self-pay | Admitting: Medical-Surgical

## 2024-02-13 ENCOUNTER — Ambulatory Visit (INDEPENDENT_AMBULATORY_CARE_PROVIDER_SITE_OTHER): Payer: Medicare Other | Admitting: Medical-Surgical

## 2024-02-13 ENCOUNTER — Telehealth: Payer: Self-pay

## 2024-02-13 VITALS — BP 130/75 | HR 71 | Ht 64.0 in | Wt 192.0 lb

## 2024-02-13 DIAGNOSIS — F4323 Adjustment disorder with mixed anxiety and depressed mood: Secondary | ICD-10-CM

## 2024-02-13 DIAGNOSIS — E66811 Obesity, class 1: Secondary | ICD-10-CM

## 2024-02-13 DIAGNOSIS — Z6832 Body mass index (BMI) 32.0-32.9, adult: Secondary | ICD-10-CM | POA: Diagnosis not present

## 2024-02-13 DIAGNOSIS — E6609 Other obesity due to excess calories: Secondary | ICD-10-CM | POA: Diagnosis not present

## 2024-02-13 NOTE — Progress Notes (Signed)
 Established patient visit  History, exam, impression, and plan:  1. Class 1 obesity due to excess calories without serious comorbidity with body mass index (BMI) of 32.0 to 32.9 in adult (Primary) Very pleasant 73 year old female presenting today with a very long history of chronic pain, fibromyalgia, and countless orthopedic problems.  She has struggled with this for decades and feels that her symptoms are worsening rather than getting better despite multiple treatments.  Endorses pain that encompasses her whole body and is limiting her mobility.  Because of this, she is unable to tolerate exercise which has led to weight gain and limits results that she gets from weight loss efforts.  She has been working closely with healthy [facility] but feels that they are giving up on her at this point.  Most recently she was able to speak with one of the providers there and they recommended she start counseling to help with anxiety/depression and eating behaviors.  She has an appointment next week set up for that.  Unfortunately, she has a habit of not eating all day until the evening when she starts eating and snacking until bedtime.  When she wakes up in the middle of the night, she goes to the kitchen and eats unhealthy options such as a jelly sandwich.  She is aware of recommendations for 3 healthy meals per day and 3 snacks that have been made by healthy weight and wellness providers but feels that she is absolutely unable to meet those expectations.  I spoke with her at length about making baby steps to work on changing her body clock around to a more daytime focused routine.  Recommended starting to eat 1 hour earlier than usual for a week or 2 and then continue cutting back to earlier and earlier until she is able to get at least a late lunch and dinner in with a couple of snacks throughout.  Also discussed exercise efforts with her.  She is losing muscle mass and is now having significant  weakness of her right leg.  May be related to lumbar spine issues however feel that her hip flexors and lower extremity strength is a significant contributor.  Would like for her to work on isometric exercises.  Demonstrated how those would be done.  Feel this would be a good option to limit irritation and hopefully prevent flares of the fibromyalgia.  Patient verbalized understanding of the instructions and is agreeable to give this a try.  Of note, the addition of Wellbutrin 150 mg in the afternoon/evening was unhelpful to curb her eating behaviors through the evening and night.  Discontinuing today.  2. Adjustment Disorder with Mixed Anxiety and Depressed Mood Long history of anxiety and depression and has been treated for greater than 30 years with multiple medications.  She is currently under the care of of Dr. Gilmore Laroche for psychiatric needs.  At her last visit, she expressed some frustration about being on multiple medications and having her request to reevaluate and cut back pushed off for 6 months at a time.  Today, she reports that her last visit went much like the ones before.  She again requested to cut back on medications however she was told that they would discuss it in 6 months and maybe consider stopping the fluoxetine.  She is very frustrated today and is requesting to be connected with a different psychiatrist.  Referral placed for psychiatry to get her in with a different provider.  For  now we will continue her medications.  She is starting counseling as noted above.  Denies SI/HI.  We did speak briefly about the possibility of ordering a GeneSight test.  She has never had this done and was unaware of the option for testing.  She will contact her insurance companies to see if Medicare or the supplements she has will cover this testing and if so, we will proceed to have that ordered. - Ambulatory referral to Psychiatry  Procedures performed this visit: None.  Return if symptoms worsen or  fail to improve.  __________________________________ Thayer Ohm, DNP, APRN, FNP-BC Primary Care and Sports Medicine [facility] [place]

## 2024-02-13 NOTE — Telephone Encounter (Signed)
 Replied via MyChart message

## 2024-02-13 NOTE — Telephone Encounter (Signed)
 Copied from CRM [id number]. Topic: Clinical - Medical Advice >> Feb 13, 2024 11:33 AM [name] wrote: Reason for CRM: Patient calling to state that her insurance will cover gene testing 100% and patient would like to proceed.

## 2024-02-15 ENCOUNTER — Other Ambulatory Visit: Payer: Medicare Other

## 2024-02-15 ENCOUNTER — Telehealth: Payer: Self-pay | Admitting: Neurology

## 2024-02-15 ENCOUNTER — Encounter: Payer: Self-pay | Admitting: Physical Therapy

## 2024-02-15 ENCOUNTER — Ambulatory Visit: Admitting: Physical Therapy

## 2024-02-15 DIAGNOSIS — M25551 Pain in right hip: Secondary | ICD-10-CM | POA: Diagnosis not present

## 2024-02-15 DIAGNOSIS — R42 Dizziness and giddiness: Secondary | ICD-10-CM

## 2024-02-15 DIAGNOSIS — M25552 Pain in left hip: Secondary | ICD-10-CM

## 2024-02-15 DIAGNOSIS — M6281 Muscle weakness (generalized): Secondary | ICD-10-CM

## 2024-02-15 NOTE — Telephone Encounter (Signed)
 no auth required sent to GI [phone]

## 2024-02-15 NOTE — Therapy (Signed)
 OUTPATIENT PHYSICAL THERAPY LOWER EXTREMITY and VESTIBULAR   Patient Name: Heather Thompson MRN: [id number] DOB:1951-05-16, 73 y.o., female Today's Date: 02/15/2024  END OF SESSION:  PT End of Session - 02/15/24 1142     Visit Number 15    Number of Visits 26    Date for PT Re-Evaluation 03/19/24    Authorization Type Medicare    Authorization - Visit Number 15    Progress Note Due on Visit 20    PT Start Time 1100    PT Stop Time 1143    PT Time Calculation (min) 43 min    Activity Tolerance Patient tolerated treatment well    Behavior During Therapy WFL for tasks assessed/performed                           Past Medical History:  Diagnosis Date   Abnormality of gait 04/23/2019   Acquired hypothyroidism 07/29/2021   Allergic rhinitis due to animal hair and dander 12/02/2015   Formatting of this note might be different from the original.  [facility]  Seen by Dr. Rockwell Germany 11/11/15     Plan- Received allergy shots today. Follow up 1 year.     Allergy    Anemia    Anemia    Anxiety    Arthritis of scaphoid-trapezium-trapezoid joint of right hand 09/10/2023   B12 deficiency    Back pain    Chronic fatigue 07/29/2021   Chronic fatigue syndrome    Chronic throat clearing 03/14/2023   Clotting disorder (HCC)    Constipation    Depression with anxiety 04/07/2009   Formatting of this note might be different from the original.  well controlled on current meds, follows with psychiatrist in [place]. Had a   h/o severe depression 5 years ago, and had ECT.     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  B12 deficiency   Iron deficiency      Herniated lumbar intervertebral disc 11/12/2020   High cholesterol    History of blood clots    History of cervical spinal surgery 01/01/2018   Formatting of this note might be different from the original.  Fusion C6 and C7 per 2018 MRI report.     History of left hip replacement 01/14/2018   History of Roux-en-Y gastric bypass    Hoarseness 03/14/2023   Hyperlipidemia 08/19/2010   Hypertension    Hyponatremia 01/17/2018   Hypothyroidism    Hypothyroidism    IBS (irritable bowel syndrome) 04/06/2021   Impaired fasting glucose 08/19/2010   Incontinence 10/30/2013   Formatting of this note might be different from the original. Formatting of this note might be different from the original. Urinary Incontinence     Infertility, female    Insomnia 04/07/2009   Formatting of this note might be different from the original.  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  Followed by [facility]  Seen by Dr. Reginia Naas 03/01/15     S/p changing thoracic spinal cord stimulator generator 08/18/14.     Lumbar spine films show spinal cord generator in left flank area. 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  Follows with Rheumatology in [facility] of this note might be different from the original.  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  Getting iron infusion via port at hematalogy/oncology center at [facility].   Was recently hospitalized in [facility] for severe anemia     Formatting of this note might be different from the original.  on OTC b12 currently     Vitamin D deficiency    Past Surgical History:  Procedure Laterality Date   ABDOMINAL HYSTERECTOMY     APPENDECTOMY     CARPAL TUNNEL RELEASE Bilateral 2023   with thumb  joint replacement   CERVICAL FUSION     CHOLECYSTECTOMY     FOOT SURGERY Bilateral    fusion   GASTRIC BYPASS     SPINAL CORD STIMULATOR INSERTION  07/2023   TOTAL HIP ARTHROPLASTY Bilateral    11/2016 & 05/2017   Patient Active Problem List   Diagnosis Date Noted   Palpitations 10/04/2023   Cardiac murmur 10/04/2023   Allergy    Anemia    Anxiety    B12 deficiency    Back pain    Chronic fatigue syndrome    Clotting disorder (HCC)    Constipation    Edema of both lower extremities  Fatty liver    Fibromyalgia    Gallbladder problem    High cholesterol    History of blood clots    History of Roux-en-Y gastric bypass    Hypothyroidism    Infertility, female    Joint pain    Kidney stones    Muscle disorder    Osteoarthritis    Prediabetes    Salzmann nodular degeneration    SOBOE (shortness of breath on exertion)    Swallowing difficulty    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   Arthritis of scaphoid-trapezium-trapezoid joint of right hand 09/10/2023   Urge incontinence 08/28/2023   OAB (overactive bladder) 08/28/2023   Lumbar post-laminectomy syndrome 07/06/2023   Snapping hip syndrome, 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   Other myositis, left thigh 04/24/2023   Sacroiliitis, not elsewhere classified (HCC) 04/24/2023   Dysphagia 04/17/2023   Elevated liver enzymes 04/02/2023   Elevated TSH 04/02/2023   Chronic throat clearing 03/14/2023   Globus sensation 03/14/2023   Hoarseness 03/14/2023   Muscle tension dysphonia 03/12/2023   Unspecified inflammatory spondylopathy, 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   Primary osteoarthritis of both knees 12/21/2021   Port-A-Cath in place 07/29/2021   Acquired hypothyroidism 07/29/2021   Chronic fatigue 07/29/2021   Vitamin B12 deficiency 07/29/2021   Vitamin D deficiency 07/29/2021   Postmenopausal estrogen deficiency 07/29/2021   IBS (irritable bowel syndrome) 04/06/2021   Retention of urine 04/06/2021   Herniated lumbar  intervertebral disc 11/12/2020   Skin sensation disturbance 11/12/2020   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   Spondylopathy, unspecified 11/12/2020   Trochanteric bursitis of both hips 11/19/2019   Low serum iron 04/23/2019   Osteomalacia 04/23/2019   Abnormality of gait 04/23/2019   History of bilateral hip arthroplasty 06/26/2018   Gastroesophageal reflux disease 06/18/2018   Hypertension 06/18/2018   Intertrigo 04/30/2018   Lentigines 04/30/2018   Hyponatremia 01/17/2018   History of cervical spinal surgery 01/01/2018   Obesity (BMI 30-39.9) 01/01/2018   SK (seborrheic keratosis) 04/18/2017   Pain management contract agreement 11/17/2016   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   Allergic rhinitis due to animal hair and dander 12/02/2015   Low back pain 03/03/2015   Incontinence 10/30/2013   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   Neutrophilic leukocytosis 08/12/2012   Thrombocytosis 08/12/2012   Osteoporosis, senile 11/15/2010   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   Hyperlipidemia 08/19/2010   Impaired fasting glucose 08/19/2010   Polyarthritis 09/28/2009   Degenerative disc disease 04/07/2009   Depression with anxiety 04/07/2009   Myopathy 04/07/2009   Insomnia 04/07/2009   Migraine 04/07/2009   Scoliosis 04/07/2009   Seasonal allergies 04/07/2009   Vitamin B12 deficiency anemia 04/07/2009    PCP: Christen Butter  REFERRING PROVIDER: Verdell Carmine DIAG: bilateral trochanteric bursitis, vertigo  THERAPY DIAG:  Bilateral hip pain  Muscle weakness (generalized)  Dizziness and giddiness  Rationale for Evaluation and Treatment: Rehabilitation  ONSET DATE: 04/2023  SUBJECTIVE:   SUBJECTIVE STATEMENT: Pt reports she continues to have pain in her hips and that her dizziness is "the same". She states she has a cervical ablation on April 1.    PERTINENT HISTORY: Bilat THA 2018, fibromyalgia, OA, spinal cord stimulator, bilat TC joint fusion  Pt had bilateral hip  replacements in 2018. Pt began having Lt hip and groin pain last summer that was so severe she went to ED. She was eventually diagnosed with bursitis. Since that time the pain has continued and is "always there". She now has bilateral hip pain, groin pain. Hips are tender  to touch. Pain increases with laying on her sides, pain with sit to stand, prolonged standing and walking. Pt has not found a way to relieve pain  Vestibular eval: pt states dizziness has been going on for "quite a while". Pt states she feels "room is spinning" when she goes from sit to stand. She reports she also feels that sensation when she goes from supine to sit but it is not as severe. She also states she veers to the right while walking. She also states that if she is standing still she will occasionally feel like she is "tipping over" to the right. The sensation only lasts a few seconds. Pt states she has had a cardiac work up which ruled out cardiac cause of dizziness PAIN:  Are you having pain? Yes: NPRS scale: 7/10 currently, 8-9/10 at worst Pain location: bilat hips Lt > Rt Pain description: sore, tender Aggravating factors: sitting in recliner, laying on sides, sit to stand Relieving factors: unknown  PRECAUTIONS: pt has spinal cord stimulator  RED FLAGS: None   WEIGHT BEARING RESTRICTIONS: No  FALLS:  Has patient fallen in last 6 months? No  OCCUPATION: retired  PLOF: Independent  PATIENT GOALS: reduce pain  NEXT MD VISIT: February 2025  OBJECTIVE:  Note: Objective measures were completed at Evaluation unless otherwise noted.  DIAGNOSTIC FINDINGS: x ray performed but not read at time as eval  PATIENT SURVEYS:  FOTO 39   MUSCLE LENGTH: Hamstrings: decreased bilat Hip flexors: decreased bilat   PALPATION: TTP bilat hip flexors, ITB, glutes, HS Increased mm spasticity bilat hamstrings, glutes, quads  LOWER EXTREMITY ROM:  Active ROM Right eval Left eval  Hip flexion    Hip extension     Hip abduction    Hip adduction    Hip internal rotation    Hip external rotation 30 50  Knee flexion    Knee extension    Ankle dorsiflexion    Ankle plantarflexion    Ankle inversion    Ankle eversion     (Blank rows = not tested)  LOWER EXTREMITY MMT:  MMT Right eval Left eval  Hip flexion 3+ 3  Hip extension 3 3  Hip abduction 3 3  Hip adduction    Hip internal rotation    Hip external rotation 3 3+  Knee flexion    Knee extension    Ankle dorsiflexion    Ankle plantarflexion    Ankle inversion    Ankle eversion     (Blank rows = not tested)   FUNCTIONAL TESTS:  5 times sit to stand: 18.85 seconds  VESTIBULAR ASSESSMENT:     SYMPTOM BEHAVIOR:  Subjective history: pt states dizziness has been going on for "quite a while". Pt states she feels "room is spinning" when she goes from sit to stand. She reports she also feels that sensation when she goes from supine to sit but it is not as severe. She also states she veers to the right while walking. She also states that if she is standing still she will occasionally feel like she is "tipping over" to the right. The sensation only lasts a few seconds.  Non-Vestibular symptoms:  none reported  Type of dizziness: Spinning/Vertigo  Frequency: multiple times a day  Duration: seconds  Aggravating factors: Induced by position change: supine to sit, sit to stand, and walking  Relieving factors: slow movements  Progression of symptoms: unchanged  OCULOMOTOR EXAM:  Ocular Alignment: normal  Ocular ROM: No Limitations  Spontaneous Nystagmus:  absent  Gaze-Induced Nystagmus: age appropriate nystagmus at end range  Smooth Pursuits: saccades and pt reports "spinning" sensation when focusing on pen  Saccades: intact     VESTIBULAR - OCULAR REFLEX:   Slow VOR: Positive Bilaterally increased spinning sensation  VOR Cancellation: Comment: increased spinning sensation  Head-Impulse Test: HIT Right: positive HIT Left:  negative     POSITIONAL TESTING: Right Dix-Hallpike: no nystagmus Left Dix-Hallpike: no nystagmus Right Roll Test: no nystagmus Left Roll Test: no nystagmus   MOTION SENSITIVITY:    Motion Sensitivity Quotient Intensity: 0 = none, 1 = Lightheaded, 2 = Mild, 3 = Moderate, 4 = Severe, 5 = Vomiting  Intensity  1. Sitting to supine   2. Supine to L side   3. Supine to R side   4. Supine to sitting   5. L Hallpike-Dix   6. Up from L    7. R Hallpike-Dix   8. Up from R    9. Sitting, head  tipped to L knee   10. Head up from L  knee   11. Sitting, head  tipped to R knee   12. Head up from R  knee   13. Sitting head turns x5   14.Sitting head nods x5   15. In stance, 180  turn to L    16. In stance, 180  turn to R      [facility] Adult PT Treatment:                                                DATE: 02/15/24 Therapeutic Exercise/Activity/NMR: Standing hip flexion submax isometric ball on wall x 12 TKE ball on wall x 12 bilat Seated hip add ball squeeze 10 x 3 sec hold Seated hip abd isometric 10 x 3 sec hold Standing hip ext isometric x 10 bilat Seated ab isometric with physioball Seated oblique isometric with physioball   [facility] Adult PT Treatment:                                                DATE: 02/12/24 Therapeutic Exercise/Activity/NMR: Long sitting Quad set (submax) 2 x 10 Standing hip flexion submax isometric ball on wall x 12 Attempted hip abd submax isometric - unable due to pain TKE ball on wall submax contraction Rt x 12 Hip IR with ball squeeze in tolerable range on Rt x 10 Standing hip ext with knee bent in tolerable range x 8 on Rt SLS on Rt 2 x 30 sec intermittent UE support VOR x 1 vertical and horizontal with marching with UE support on counter  Self Care: Review of HEP, prognosis, review of relevant anatomy and rationale for treatment  [facility] Adult PT Treatment:                                                DATE: 02/05/24 Therapeutic  Exercise/Activity/NMR: VOR with cancelllation vertical and horizontal. Standing with plain background Attempted VOR with cancellation with gait - requires mod A to correct LOB Standing on trampoline with UE support VOR x 1 vertical and horizontal VOR x  1 with marching - pt unable due to LOB VOR x 1 vertical and horizontal with marching with UE support on counter Pt unable to perform sit <> stand without UE support today  --> 5 x STS with UE support with only mild increase in dizziness  Self Care: Pt education on hip anatomy and activity modifications due to increased Rt pain                                                                                                                         PATIENT EDUCATION:  Education details: PT POC and goals, HEP Person educated: Patient Education method: Explanation, Demonstration, and Handouts Education comprehension: verbalized understanding and returned demonstration  HOME EXERCISE PROGRAM: Access Code: [email or web address] URL: [email or web address] Date: 02/12/2024 Prepared by: Reggy Eye  Exercises - Sit to Stand  - 1 x daily - 7 x weekly - 2 sets - 10 reps - Seated Hamstring Stretch  - 1 x daily - 7 x weekly - 1 sets - 3 reps - 20-30 seconds hold - Seated Figure 4 Piriformis Stretch  - 1 x daily - 7 x weekly - 1 sets - 3 reps - 20-30 seconds hold - Standing Hip Abduction with Bent Knee  - 1 x daily - 7 x weekly - 3 sets - 10 reps - Standing Hip Extension with Leg Bent and Support  - 1 x daily - 7 x weekly - 3 sets - 10 reps - Single Leg 3 Way Reach with Support  - 1 x daily - 7 x weekly - 3 sets - 10 reps - Seated Hip Internal Rotation with Ball and Resistance  - 1 x daily - 7 x weekly - 3 sets - 10 reps - Seated Hip Flexor Stretch  - 1 x daily - 7 x weekly - 1 sets - 3 reps - 30 seconds hold - Resistance Pulldown with March  - 1 x daily - 7 x weekly - 3 sets - 10 reps - Standing Bilateral Low Shoulder Row with Anchored  Resistance  - 1 x daily - 7 x weekly - 3 sets - 10 reps - Standing Anti-Rotation Press with Anchored Resistance  - 1 x daily - 7 x weekly - 3 sets - 10 reps - Seated Gaze Stabilization with Head Nod  - 3 x daily - 7 x weekly - 3 sets - 10 reps - Seated Gaze Stabilization with Head Rotation  - 3 x daily - 7 x weekly - 3 sets - 10 reps - Seated VOR Cancellation  - 1 x daily - 7 x weekly - 3 sets - 10 reps - Supine Quad Set  - 1 x daily - 7 x weekly - 2 sets - 10 reps - Standing Isometric Hip Flexion with Ball at Wall  - 1 x daily - 7 x weekly - 2 sets - 10 reps - Single Leg Stance with Support  - 1 x daily - 7 x weekly - 1  sets - 3 reps - 20-30 seconds hold  ASSESSMENT:  CLINICAL IMPRESSION: Session focused on isometrics in tolerable range. Pt with good tolerance to new exercises. No pictures of exercises on medbridge so pt given verbal instructions and demo'd understanding  OBJECTIVE IMPAIRMENTS: decreased activity tolerance, decreased mobility, difficulty walking, decreased ROM, decreased strength, increased muscle spasms, impaired flexibility, and pain.     GOALS: Goals reviewed with patient? Yes  SHORT TERM GOALS: Target date: 01/09/2024   Pt will be independent with initial HEP Baseline: Goal status: MET  2.  Pt will improve FOTO to > = 45 to demo improving functional mobility Baseline: 33 on 01/11/24 Goal status: IN PROGRESS   LONG TERM GOALS: Target date: 03/19/2024      Pt will be independent with advanced HEP and plan for continued exercise in community Baseline:  Goal status: INITIAL  2.  Pt will improve FOTO to >= 50 to demo improved functional mobility Baseline:  Goal status: INITIAL  3.  Pt will improve hip strength to 4+/5 bilat to improve standing and walking tolerance Baseline:  Goal status: INITIAL  4.  Pt will tolerate laying on both sides with pain <= 2/10 Baseline:  Goal status: INITIAL  5.  Pt will improve 5 x STS to <= 15 seconds Baseline:   Goal status: INITIAL  6.  Pt will report transitioning sit to stand without dizziness symptoms 75% of the time Baseline:  Goal status: INITIAL 7.  Pt will demo decreased risk of falls with DGI >= 21/24 Baseline: 18/24 Goal status: INITIAL       PLAN:  PT FREQUENCY: 2x/week  PT DURATION: 8 weeks  PLANNED INTERVENTIONS: 97164- PT Re-evaluation, 97110-Therapeutic exercises, 97530- Therapeutic activity, 97112- Neuromuscular re-education, 97535- Self Care, 16109- Manual therapy, [id number]- Aquatic Therapy, 97035- Ultrasound, [id number]- Ionotophoresis 4mg /ml Dexamethasone, Patient/Family education, Balance training, Taping, Dry Needling, Cryotherapy, and Moist heat  PLAN FOR NEXT SESSION: vestibular, balance, hip stability, hip strengthening   Ashlay Altieri, PT 02/15/2024, 11:42 AM

## 2024-02-19 ENCOUNTER — Ambulatory Visit: Payer: Medicare Other | Admitting: Nurse Practitioner

## 2024-02-19 ENCOUNTER — Other Ambulatory Visit: Payer: Self-pay | Admitting: Medical-Surgical

## 2024-02-19 ENCOUNTER — Ambulatory Visit: Admitting: Physical Therapy

## 2024-02-19 ENCOUNTER — Ambulatory Visit
Admission: RE | Admit: 2024-02-19 | Discharge: 2024-02-19 | Discharge status: Home / Self Care | Source: Ambulatory Visit | Attending: Neurology

## 2024-02-19 DIAGNOSIS — H8111 Benign paroxysmal vertigo, right ear: Secondary | ICD-10-CM | POA: Diagnosis not present

## 2024-02-19 MED ORDER — GADOPICLENOL 0.5 MMOL/ML IV SOLN
6.0000 mL | Freq: Once | INTRAVENOUS | Status: AC | PRN
  Administered 2024-02-19: 8 mL via INTRAVENOUS

## 2024-02-20 ENCOUNTER — Telehealth: Payer: Self-pay | Admitting: Medical-Surgical

## 2024-02-20 ENCOUNTER — Ambulatory Visit: Admitting: Physical Therapy

## 2024-02-20 ENCOUNTER — Encounter: Payer: Self-pay | Admitting: Physical Therapy

## 2024-02-20 DIAGNOSIS — M25551 Pain in right hip: Secondary | ICD-10-CM | POA: Diagnosis not present

## 2024-02-20 DIAGNOSIS — M6281 Muscle weakness (generalized): Secondary | ICD-10-CM

## 2024-02-20 DIAGNOSIS — R42 Dizziness and giddiness: Secondary | ICD-10-CM

## 2024-02-20 NOTE — Telephone Encounter (Signed)
 Copied from CRM [id number]. Topic: General - Other >> Feb 20, 2024  1:14 PM Emylou G wrote: Reason for CRM: Dr Larinda Buttery gave referral for medication review at the [facility] office but when she called they are no longer there..  She needs alternative site for the referral that takes her message.Marland Kitchen Pls call her [phone]  Elnita Maxwell is this something you handle?

## 2024-02-20 NOTE — Therapy (Signed)
 OUTPATIENT PHYSICAL THERAPY LOWER EXTREMITY and VESTIBULAR   Patient Name: Heather Thompson MRN: [id number] DOB:1951/05/05, 73 y.o., female Today's Date: 02/20/2024  END OF SESSION:  PT End of Session - 02/20/24 1140     Visit Number 16    Number of Visits 26    Date for PT Re-Evaluation 03/19/24    Authorization Type Medicare    Authorization - Visit Number 16    Progress Note Due on Visit 20    PT Start Time 1100    PT Stop Time 1143    PT Time Calculation (min) 43 min    Activity Tolerance Patient tolerated treatment well    Behavior During Therapy WFL for tasks assessed/performed                            Past Medical History:  Diagnosis Date   Abnormality of gait 04/23/2019   Acquired hypothyroidism 07/29/2021   Allergic rhinitis due to animal hair and dander 12/02/2015   Formatting of this note might be different from the original.  [facility]  Seen by Dr. Rockwell Germany 11/11/15     Plan- Received allergy shots today. Follow up 1 year.     Allergy    Anemia    Anemia    Anxiety    Arthritis of scaphoid-trapezium-trapezoid joint of right hand 09/10/2023   B12 deficiency    Back pain    Chronic fatigue 07/29/2021   Chronic fatigue syndrome    Chronic throat clearing 03/14/2023   Clotting disorder (HCC)    Constipation    Depression with anxiety 04/07/2009   Formatting of this note might be different from the original.  well controlled on current meds, follows with psychiatrist in [place]. Had a   h/o severe depression 5 years ago, and had ECT.     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  B12 deficiency   Iron deficiency      Herniated lumbar intervertebral disc 11/12/2020   High cholesterol    History of blood clots    History of cervical spinal surgery 01/01/2018   Formatting of this note might be different from the original.  Fusion C6 and C7 per 2018 MRI report.     History of left hip replacement 01/14/2018   History of Roux-en-Y gastric bypass    Hoarseness 03/14/2023   Hyperlipidemia 08/19/2010   Hypertension    Hyponatremia 01/17/2018   Hypothyroidism    Hypothyroidism    IBS (irritable bowel syndrome) 04/06/2021   Impaired fasting glucose 08/19/2010   Incontinence 10/30/2013   Formatting of this note might be different from the original. Formatting of this note might be different from the original. Urinary Incontinence     Infertility, female    Insomnia 04/07/2009   Formatting of this note might be different from the original.  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  Followed by [facility]  Seen by Dr. Reginia Naas 03/01/15     S/p changing thoracic spinal cord stimulator generator 08/18/14.     Lumbar spine films show spinal cord generator in left flank area. 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  Follows with Rheumatology in [facility] of this note might be different from the original.  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  Getting iron infusion via port at hematalogy/oncology center at [facility].   Was recently hospitalized in [facility] for severe anemia     Formatting of this note might be different from the original.  on OTC b12 currently     Vitamin D deficiency    Past Surgical History:  Procedure Laterality Date   ABDOMINAL HYSTERECTOMY     APPENDECTOMY     CARPAL TUNNEL RELEASE Bilateral 2023   with thumb  joint replacement   CERVICAL FUSION     CHOLECYSTECTOMY     FOOT SURGERY Bilateral    fusion   GASTRIC BYPASS     SPINAL CORD STIMULATOR INSERTION  07/2023   TOTAL HIP ARTHROPLASTY Bilateral    11/2016 & 05/2017   Patient Active Problem List   Diagnosis Date Noted   Palpitations 10/04/2023   Cardiac murmur 10/04/2023   Allergy    Anemia    Anxiety    B12 deficiency    Back pain    Chronic fatigue syndrome    Clotting disorder (HCC)    Constipation    Edema of both lower extremities  Fatty liver    Fibromyalgia    Gallbladder problem    High cholesterol    History of blood clots    History of Roux-en-Y gastric bypass    Hypothyroidism    Infertility, female    Joint pain    Kidney stones    Muscle disorder    Osteoarthritis    Prediabetes    Salzmann nodular degeneration    SOBOE (shortness of breath on exertion)    Swallowing difficulty    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   Arthritis of scaphoid-trapezium-trapezoid joint of right hand 09/10/2023   Urge incontinence 08/28/2023   OAB (overactive bladder) 08/28/2023   Lumbar post-laminectomy syndrome 07/06/2023   Snapping hip syndrome, 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   Other myositis, left thigh 04/24/2023   Sacroiliitis, not elsewhere classified (HCC) 04/24/2023   Dysphagia 04/17/2023   Elevated liver enzymes 04/02/2023   Elevated TSH 04/02/2023   Chronic throat clearing 03/14/2023   Globus sensation 03/14/2023   Hoarseness 03/14/2023   Muscle tension dysphonia 03/12/2023   Unspecified inflammatory spondylopathy, 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   Primary osteoarthritis of both knees 12/21/2021   Port-A-Cath in place 07/29/2021   Acquired hypothyroidism 07/29/2021   Chronic fatigue 07/29/2021   Vitamin B12 deficiency 07/29/2021   Vitamin D deficiency 07/29/2021   Postmenopausal estrogen deficiency 07/29/2021   IBS (irritable bowel syndrome) 04/06/2021   Retention of urine 04/06/2021   Herniated lumbar  intervertebral disc 11/12/2020   Skin sensation disturbance 11/12/2020   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   Spondylopathy, unspecified 11/12/2020   Trochanteric bursitis of both hips 11/19/2019   Low serum iron 04/23/2019   Osteomalacia 04/23/2019   Abnormality of gait 04/23/2019   History of bilateral hip arthroplasty 06/26/2018   Gastroesophageal reflux disease 06/18/2018   Hypertension 06/18/2018   Intertrigo 04/30/2018   Lentigines 04/30/2018   Hyponatremia 01/17/2018   History of cervical spinal surgery 01/01/2018   Obesity (BMI 30-39.9) 01/01/2018   SK (seborrheic keratosis) 04/18/2017   Pain management contract agreement 11/17/2016   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   Allergic rhinitis due to animal hair and dander 12/02/2015   Low back pain 03/03/2015   Incontinence 10/30/2013   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   Neutrophilic leukocytosis 08/12/2012   Thrombocytosis 08/12/2012   Osteoporosis, senile 11/15/2010   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   Hyperlipidemia 08/19/2010   Impaired fasting glucose 08/19/2010   Polyarthritis 09/28/2009   Degenerative disc disease 04/07/2009   Depression with anxiety 04/07/2009   Myopathy 04/07/2009   Insomnia 04/07/2009   Migraine 04/07/2009   Scoliosis 04/07/2009   Seasonal allergies 04/07/2009   Vitamin B12 deficiency anemia 04/07/2009    PCP: Christen Butter  REFERRING PROVIDER: Verdell Carmine DIAG: bilateral trochanteric bursitis, vertigo  THERAPY DIAG:  Bilateral hip pain  Muscle weakness (generalized)  Dizziness and giddiness  Rationale for Evaluation and Treatment: Rehabilitation  ONSET DATE: 04/2023  SUBJECTIVE:   SUBJECTIVE STATEMENT: 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 She bought a ball to do isometrics at home. She states she is able to touch her thighs more than before. She had a "zing" in her right hip which felt like an electrical shock. No further symptoms after the  "zing"    PERTINENT HISTORY: Bilat THA 2018, fibromyalgia, OA, spinal cord stimulator, bilat TC joint fusion  Pt had bilateral hip replacements in 2018. Pt began having Lt hip and groin pain last summer that was so severe she went to ED. She was eventually diagnosed with  bursitis. Since that time the pain has continued and is "always there". She now has bilateral hip pain, groin pain. Hips are tender to touch. Pain increases with laying on her sides, pain with sit to stand, prolonged standing and walking. Pt has not found a way to relieve pain  Vestibular eval: pt states dizziness has been going on for "quite a while". Pt states she feels "room is spinning" when she goes from sit to stand. She reports she also feels that sensation when she goes from supine to sit but it is not as severe. She also states she veers to the right while walking. She also states that if she is standing still she will occasionally feel like she is "tipping over" to the right. The sensation only lasts a few seconds. Pt states she has had a cardiac work up which ruled out cardiac cause of dizziness PAIN:  Are you having pain? Yes: NPRS scale: 7/10 currently, 8-9/10 at worst Pain location: bilat hips Lt > Rt Pain description: sore, tender Aggravating factors: sitting in recliner, laying on sides, sit to stand Relieving factors: unknown  PRECAUTIONS: pt has spinal cord stimulator  RED FLAGS: None   WEIGHT BEARING RESTRICTIONS: No  FALLS:  Has patient fallen in last 6 months? No  OCCUPATION: retired  PLOF: Independent  PATIENT GOALS: reduce pain  NEXT MD VISIT: February 2025  OBJECTIVE:  Note: Objective measures were completed at Evaluation unless otherwise noted.  DIAGNOSTIC FINDINGS: x ray performed but not read at time as eval  PATIENT SURVEYS:  FOTO 39   MUSCLE LENGTH: Hamstrings: decreased bilat Hip flexors: decreased bilat   PALPATION: TTP bilat hip flexors, ITB, glutes, HS Increased mm  spasticity bilat hamstrings, glutes, quads  LOWER EXTREMITY ROM:  Active ROM Right eval Left eval  Hip flexion    Hip extension    Hip abduction    Hip adduction    Hip internal rotation    Hip external rotation 30 50  Knee flexion    Knee extension    Ankle dorsiflexion    Ankle plantarflexion    Ankle inversion    Ankle eversion     (Blank rows = not tested)  LOWER EXTREMITY MMT:  MMT Right eval Left eval  Hip flexion 3+ 3  Hip extension 3 3  Hip abduction 3 3  Hip adduction    Hip internal rotation    Hip external rotation 3 3+  Knee flexion    Knee extension    Ankle dorsiflexion    Ankle plantarflexion    Ankle inversion    Ankle eversion     (Blank rows = not tested)   FUNCTIONAL TESTS:  5 times sit to stand: 18.85 seconds  VESTIBULAR ASSESSMENT:     SYMPTOM BEHAVIOR:  Subjective history: pt states dizziness has been going on for "quite a while". Pt states she feels "room is spinning" when she goes from sit to stand. She reports she also feels that sensation when she goes from supine to sit but it is not as severe. She also states she veers to the right while walking. She also states that if she is standing still she will occasionally feel like she is "tipping over" to the right. The sensation only lasts a few seconds.  Non-Vestibular symptoms:  none reported  Type of dizziness: Spinning/Vertigo  Frequency: multiple times a day  Duration: seconds  Aggravating factors: Induced by position change: supine to sit, sit to stand, and walking  Relieving  factors: slow movements  Progression of symptoms: unchanged  OCULOMOTOR EXAM:  Ocular Alignment: normal  Ocular ROM: No Limitations  Spontaneous Nystagmus: absent  Gaze-Induced Nystagmus: age appropriate nystagmus at end range  Smooth Pursuits: saccades and pt reports "spinning" sensation when focusing on pen  Saccades: intact     VESTIBULAR - OCULAR REFLEX:   Slow VOR: Positive Bilaterally increased  spinning sensation  VOR Cancellation: Comment: increased spinning sensation  Head-Impulse Test: HIT Right: positive HIT Left: negative     POSITIONAL TESTING: Right Dix-Hallpike: no nystagmus Left Dix-Hallpike: no nystagmus Right Roll Test: no nystagmus Left Roll Test: no nystagmus   MOTION SENSITIVITY:    Motion Sensitivity Quotient Intensity: 0 = none, 1 = Lightheaded, 2 = Mild, 3 = Moderate, 4 = Severe, 5 = Vomiting  Intensity  1. Sitting to supine   2. Supine to L side   3. Supine to R side   4. Supine to sitting   5. L Hallpike-Dix   6. Up from L    7. R Hallpike-Dix   8. Up from R    9. Sitting, head  tipped to L knee   10. Head up from L  knee   11. Sitting, head  tipped to R knee   12. Head up from R  knee   13. Sitting head turns x5   14.Sitting head nods x5   15. In stance, 180  turn to L    16. In stance, 180  turn to R      [facility] Adult PT Treatment:                                                DATE: 02/20/24 Therapeutic Exercise/Activity: Standing hip flexion submax isometric ball on wall x 12 TKE ball on wall x 12 bilat Seated hip add ball squeeze 10 x 3 sec hold Seated hip abd isometric 10 x 3 sec hold Standing hip ext isometric x 10 bilat Seated ab isometric with physioball Seated oblique isometric with physioball  Neuromuscular re-ed: Habituation reclined to sitting upright  [facility] Adult PT Treatment:                                                DATE: 02/15/24 Therapeutic Exercise/Activity/NMR: Standing hip flexion submax isometric ball on wall x 12 TKE ball on wall x 12 bilat Seated hip add ball squeeze 10 x 3 sec hold Seated hip abd isometric 10 x 3 sec hold Standing hip ext isometric x 10 bilat Seated ab isometric with physioball Seated oblique isometric with physioball   [facility] Adult PT Treatment:                                                DATE: 02/12/24 Therapeutic Exercise/Activity/NMR: Long sitting Quad set (submax) 2 x  10 Standing hip flexion submax isometric ball on wall x 12 Attempted hip abd submax isometric - unable due to pain TKE ball on wall submax contraction Rt x 12 Hip IR with ball squeeze in tolerable range on Rt x 10 Standing hip ext with  knee bent in tolerable range x 8 on Rt SLS on Rt 2 x 30 sec intermittent UE support VOR x 1 vertical and horizontal with marching with UE support on counter  Self Care: Review of HEP, prognosis, review of relevant anatomy and rationale for treatment                                                                                                                        PATIENT EDUCATION:  Education details: PT POC and goals, HEP Person educated: Patient Education method: Explanation, Demonstration, and Handouts Education comprehension: verbalized understanding and returned demonstration  HOME EXERCISE PROGRAM: Access Code: [email or web address] URL: [email or web address] Date: 02/12/2024 Prepared by: Reggy Eye  Exercises - Sit to Stand  - 1 x daily - 7 x weekly - 2 sets - 10 reps - Seated Hamstring Stretch  - 1 x daily - 7 x weekly - 1 sets - 3 reps - 20-30 seconds hold - Seated Figure 4 Piriformis Stretch  - 1 x daily - 7 x weekly - 1 sets - 3 reps - 20-30 seconds hold - Standing Hip Abduction with Bent Knee  - 1 x daily - 7 x weekly - 3 sets - 10 reps - Standing Hip Extension with Leg Bent and Support  - 1 x daily - 7 x weekly - 3 sets - 10 reps - Single Leg 3 Way Reach with Support  - 1 x daily - 7 x weekly - 3 sets - 10 reps - Seated Hip Internal Rotation with Ball and Resistance  - 1 x daily - 7 x weekly - 3 sets - 10 reps - Seated Hip Flexor Stretch  - 1 x daily - 7 x weekly - 1 sets - 3 reps - 30 seconds hold - Resistance Pulldown with March  - 1 x daily - 7 x weekly - 3 sets - 10 reps - Standing Bilateral Low Shoulder Row with Anchored Resistance  - 1 x daily - 7 x weekly - 3 sets - 10 reps - Standing Anti-Rotation Press with Anchored  Resistance  - 1 x daily - 7 x weekly - 3 sets - 10 reps - Seated Gaze Stabilization with Head Nod  - 3 x daily - 7 x weekly - 3 sets - 10 reps - Seated Gaze Stabilization with Head Rotation  - 3 x daily - 7 x weekly - 3 sets - 10 reps - Seated VOR Cancellation  - 1 x daily - 7 x weekly - 3 sets - 10 reps - Supine Quad Set  - 1 x daily - 7 x weekly - 2 sets - 10 reps - Standing Isometric Hip Flexion with Ball at [employer]  - 1 x daily - 7 x weekly - 2 sets - 10 reps - Single Leg Stance with Support  - 1 x daily - 7 x weekly - 1 sets - 3 reps - 20-30 seconds hold  ASSESSMENT:  CLINICAL IMPRESSION: Pt with improving exercise  tolerance with continued use of isometrics. May try to advance to concentric exercises in the next few visits. Educated on habituation to improve supine to sit tolerance  OBJECTIVE IMPAIRMENTS: decreased activity tolerance, decreased mobility, difficulty walking, decreased ROM, decreased strength, increased muscle spasms, impaired flexibility, and pain.     GOALS: Goals reviewed with patient? Yes  SHORT TERM GOALS: Target date: 01/09/2024   Pt will be independent with initial HEP Baseline: Goal status: MET  2.  Pt will improve FOTO to > = 45 to demo improving functional mobility Baseline: 33 on 01/11/24 Goal status: IN PROGRESS   LONG TERM GOALS: Target date: 03/19/2024      Pt will be independent with advanced HEP and plan for continued exercise in community Baseline:  Goal status: INITIAL  2.  Pt will improve FOTO to >= 50 to demo improved functional mobility Baseline:  Goal status: INITIAL  3.  Pt will improve hip strength to 4+/5 bilat to improve standing and walking tolerance Baseline:  Goal status: INITIAL  4.  Pt will tolerate laying on both sides with pain <= 2/10 Baseline:  Goal status: INITIAL  5.  Pt will improve 5 x STS to <= 15 seconds Baseline:  Goal status: INITIAL  6.  Pt will report transitioning sit to stand without dizziness  symptoms 75% of the time Baseline:  Goal status: INITIAL 7.  Pt will demo decreased risk of falls with DGI >= 21/24 Baseline: 18/24 Goal status: INITIAL       PLAN:  PT FREQUENCY: 2x/week  PT DURATION: 8 weeks  PLANNED INTERVENTIONS: 97164- PT Re-evaluation, 97110-Therapeutic exercises, 97530- Therapeutic activity, 97112- Neuromuscular re-education, 97535- Self Care, 16109- Manual therapy, [id number]- Aquatic Therapy, 97035- Ultrasound, [id number]- Ionotophoresis 4mg /ml Dexamethasone, Patient/Family education, Balance training, Taping, Dry Needling, Cryotherapy, and Moist heat  PLAN FOR NEXT SESSION: vestibular, balance, hip stability, hip strengthening   Atha Muradyan, PT 02/20/2024, 11:41 AM

## 2024-02-21 ENCOUNTER — Telehealth: Payer: Self-pay

## 2024-02-21 ENCOUNTER — Ambulatory Visit (INDEPENDENT_AMBULATORY_CARE_PROVIDER_SITE_OTHER): Admitting: Professional

## 2024-02-21 ENCOUNTER — Encounter: Payer: Self-pay | Admitting: Professional

## 2024-02-21 ENCOUNTER — Encounter: Payer: Self-pay | Admitting: Neurology

## 2024-02-21 DIAGNOSIS — F411 Generalized anxiety disorder: Secondary | ICD-10-CM

## 2024-02-21 DIAGNOSIS — F332 Major depressive disorder, recurrent severe without psychotic features: Secondary | ICD-10-CM

## 2024-02-21 NOTE — Progress Notes (Signed)
   Heather Thompson, [facility]

## 2024-02-21 NOTE — Progress Notes (Signed)
   02/21/2024  Patient ID: Heather Thompson, female   DOB: 03-Oct-1951, 73 y.o.   MRN: [id number]  Clinic routed request stating patient had called regarding referral PCP was placing for medication review.  Contacted patient, and this is in regard to referral for new psychiatry provider to review current medication regimen and change if needed.  Did review control of blood pressure, pre-diabetes, and cholesterol while on the phone with the patient based on inquiries from her.  Stated I did not believe medication changes are needed; and that dietary and lifestyle modifications will likely help in getting A1c and lipid values at goal.  Informing Heather Thompson that patient and I spoke; she is awaiting further information about possible provider in [facility].  Heather Thompson, PharmD, DPLA
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
## 2024-02-25 ENCOUNTER — Other Ambulatory Visit: Payer: Self-pay | Admitting: Medical-Surgical

## 2024-02-25 ENCOUNTER — Telehealth (INDEPENDENT_AMBULATORY_CARE_PROVIDER_SITE_OTHER): Admitting: Psychology

## 2024-02-25 DIAGNOSIS — F4323 Adjustment disorder with mixed anxiety and depressed mood: Secondary | ICD-10-CM

## 2024-02-25 DIAGNOSIS — F5089 Other specified eating disorder: Secondary | ICD-10-CM

## 2024-02-25 NOTE — Progress Notes (Signed)
  Office: [phone]  /  Fax: [phone]    Date: February 25, 2024  Appointment Start Time: 11:23am Duration: 24 minutes Provider: Lawerance Cruel, Psy.D. Type of Session: Individual Therapy  Location of Patient: Home (private location) Location of Provider: Provider's Home (private office) Type of Contact: Telepsychological Visit via MyChart Video Visit  Session Content: Heather Thompson is a 73 y.o. female presenting for a follow-up appointment to address the previously established treatment goal of increasing coping skills.Today's appointment was a telepsychological visit. [name] provided verbal consent for today's telepsychological appointment and she is aware she is responsible for securing confidentiality on her end of the session. Prior to proceeding with today's appointment, [name]'s physical location at the time of this appointment was obtained as well a phone number she could be reached at in the event of technical difficulties. [name] and this provider participated in today's telepsychological service.   This provider conducted a brief check-in. [name] stated she joined [name] yesterday. Further explored and processed. She explained she needs something "more flexible." She also discussed increasing physical activity and initiating therapeutic services with [name]. [name] discussed a plan to cease services with the clinic. Associated thoughts and feelings were processed. Briefly reviewed learned skills. Overall, [name] was receptive to today's appointment as evidenced by openness to sharing, responsiveness to feedback, and willingness to continue engaging in learned skills.  Mental Status Examination:  Appearance: neat Behavior: appropriate to circumstances Mood: neutral Affect: mood congruent Speech: WNL Eye Contact: appropriate Psychomotor Activity: WNL Gait: unable to assess Thought Process: linear, logical, and goal directed and no evidence or endorsement of  suicidal, homicidal, and self-harm ideation, plan and intent  Thought Content/Perception: no hallucinations, delusions, bizarre thinking or behavior endorsed or observed Orientation: AAOx4 Memory/Concentration: memory, attention, language, and fund of knowledge intact  Insight: fair Judgment: fair  Interventions:  Conducted a brief chart review Provided empathic reflections and validation Provided positive reinforcement Employed supportive psychotherapy interventions to facilitate reduced distress and to improve coping skills with identified stressors Reviewed learned skills  DSM-5 Diagnosis(es):  F50.89 Other Specified Feeding or Eating Disorder, Emotional Eating Behaviors and  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 [name] demonstrated progress in her goal as evidenced by increased awareness of hunger patterns and increased awareness of triggers for emotional eating behaviors. [name] also continues to demonstrate willingness to engage in learned skill(s).  Plan: Today was [name]'s last appointment with this provider as she started with [facility] [employer] and [facility]. She acknowledged understanding that she may request a follow-up appointment with this provider in the future as long as she is established with the clinic. [name] will continue with her primary therapist. No further follow-up planned by this provider.    Lawerance Cruel, PsyD

## 2024-02-27 ENCOUNTER — Ambulatory Visit: Payer: Medicare Other | Admitting: Urology

## 2024-02-28 ENCOUNTER — Ambulatory Visit (INDEPENDENT_AMBULATORY_CARE_PROVIDER_SITE_OTHER): Payer: Medicare Other | Admitting: Urology

## 2024-02-28 ENCOUNTER — Encounter: Payer: Self-pay | Admitting: Urology

## 2024-02-28 VITALS — BP 135/79 | HR 70 | Ht 64.0 in | Wt 190.0 lb

## 2024-02-28 DIAGNOSIS — N3941 Urge incontinence: Secondary | ICD-10-CM

## 2024-02-28 DIAGNOSIS — N3281 Overactive bladder: Secondary | ICD-10-CM | POA: Diagnosis not present

## 2024-02-28 DIAGNOSIS — R32 Unspecified urinary incontinence: Secondary | ICD-10-CM

## 2024-02-28 LAB — MICROSCOPIC EXAMINATION

## 2024-02-28 LAB — URINALYSIS, ROUTINE W REFLEX MICROSCOPIC
Bilirubin, UA: NEGATIVE
Glucose, UA: NEGATIVE
Ketones, UA: NEGATIVE
Leukocytes,UA: NEGATIVE
Nitrite, UA: NEGATIVE
Protein,UA: NEGATIVE
Specific Gravity, UA: 1.01 (ref 1.005–1.030)
Urobilinogen, Ur: 1 mg/dL (ref 0.2–1.0)
pH, UA: 7 (ref 5.0–7.5)

## 2024-02-28 MED ORDER — SOLIFENACIN SUCCINATE 10 MG PO TABS: 10.0000 mg | ORAL_TABLET | Freq: Every day | ORAL | 11 refills | Status: DC

## 2024-02-28 NOTE — Progress Notes (Signed)
 Assessment: 1. Urge incontinence   2. OAB (overactive bladder)     Plan: Continue bladder diet  Recommend increasing Solifenacin to 10 mg daily.  New prescription sent. I advised patient to contact me with results of increased dose in approximately 1 month. Return to office in 6 months.  Chief Complaint:  Chief Complaint  Patient presents with   Urinary Incontinence    History of Present Illness:  Heather Thompson is a 73 y.o. female who is seen for further evaluation of OAB and urge incontinence.   She has a long history of overactive bladder symptoms with some associated incontinence.  She was previously treated at the [facility] in 2018 with oxybutynin and Myrbetriq.  At her visit in 10/24, she reported an increase in her frequency, urgency, and urge incontinence for several months.  She was previously on oxybutynin which was not working well in controlling her symptoms.  She was changed to Solifenacin 5 mg daily and noted a significant improvement in her symptoms with this medication.  She reported some dry mouth but no problems with constipation.  No incontinence.  She noted a decrease in her frequency and urgency as well.  No dysuria or gross hematuria.  No recent UTIs.  She returns today for follow-up.  She continues on Solifenacin 5 mg nightly.  She has noted a decrease in her urgency and daytime urge incontinence.  She currently reports nocturia 2-3 times.  She has had 2 episodes of nighttime incontinence within the past 6 months.  No dysuria or gross hematuria.  Portions of the above documentation were copied from a prior visit for review purposes only.   Past Medical History:  Past Medical History:  Diagnosis Date   Abnormality of gait 04/23/2019   Acquired hypothyroidism 07/29/2021   Allergic rhinitis due to animal hair and dander 12/02/2015   Formatting of this note might be different from the original.  [facility]  Seen by Dr. Rockwell Germany 11/11/15      Plan- Received allergy shots today. Follow up 1 year.     Allergy    Anemia    Anemia    Anxiety    Arthritis of scaphoid-trapezium-trapezoid joint of right hand 09/10/2023   B12 deficiency    Back pain    Chronic fatigue 07/29/2021   Chronic fatigue syndrome    Chronic throat clearing 03/14/2023   Clotting disorder (HCC)    Constipation    Depression with anxiety 04/07/2009   Formatting of this note might be different from the original.  well controlled on current meds, follows with psychiatrist in [place]. Had a   h/o severe depression 5 years ago, and had ECT.     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  B12 deficiency   Iron deficiency     Herniated lumbar intervertebral disc 11/12/2020   High cholesterol    History of blood clots    History of cervical spinal surgery 01/01/2018   Formatting of this note might be different from the original.  Fusion C6 and C7 per 2018 MRI report.     History of left hip replacement 01/14/2018   History  of Roux-en-Y gastric bypass    Hoarseness 03/14/2023   Hyperlipidemia 08/19/2010   Hypertension    Hyponatremia 01/17/2018   Hypothyroidism    Hypothyroidism    IBS (irritable bowel syndrome) 04/06/2021   Impaired fasting glucose 08/19/2010   Incontinence 10/30/2013   Formatting of this note might be different from the original. Formatting of this note might be different from the original. Urinary Incontinence     Infertility, female    Insomnia 04/07/2009   Formatting of this note might be different from the original.  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  Followed by [facility]  Seen by Dr. Reginia Naas 03/01/15     S/p changing thoracic spinal cord stimulator generator 08/18/14.     Lumbar spine films show spinal cord generator in left flank area. 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  Follows with Rheumatology in [facility] of this note might be different from the original.  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  Getting iron infusion via port at hematalogy/oncology center at [facility].   Was recently hospitalized in [facility] for severe anemia     Formatting of this note might be different from the original.  on OTC b12 currently     Vitamin D deficiency  Past Surgical History:  Past Surgical History:  Procedure Laterality Date   ABDOMINAL HYSTERECTOMY     APPENDECTOMY     CARPAL TUNNEL RELEASE Bilateral 2023   with thumb joint replacement   CERVICAL FUSION     CHOLECYSTECTOMY     FOOT SURGERY Bilateral    fusion   GASTRIC BYPASS     SPINAL CORD STIMULATOR INSERTION  07/2023   TOTAL HIP ARTHROPLASTY Bilateral    11/2016 & 05/2017    Allergies:  Allergies  Allergen Reactions   Compazine [Prochlorperazine] Anaphylaxis   Phenothiazines Anaphylaxis and Swelling    Caused her throat to close     Prednisone Itching, Swelling and Rash    Facial swelling   Pregabalin Itching and Swelling    Swelling and itching of hands and feet.       Family History:  Family History  Problem Relation Age of Onset   Hyperlipidemia Mother    Hypertension Mother    Heart attack Mother    Heart disease Mother    Sudden death Mother    Depression Mother    Anxiety disorder Mother    Obesity Mother    Anxiety disorder Father    Depression Father    Cancer Father    Hyperlipidemia Father    Hypertension Father    Lung cancer  Father    Prostate cancer Father    Kidney cancer Brother    Healthy Daughter    Healthy Son     Social History:  Social History   Tobacco Use   Smoking status: Never    Passive exposure: Past   Smokeless tobacco: Never  Vaping Use   Vaping status: Never Used  Substance Use Topics   Alcohol use: Yes    Comment: rarely   Drug use: Never    ROS: Constitutional:  Negative for fever, chills, weight loss CV: Negative for chest pain, previous MI, hypertension Respiratory:  Negative for shortness of breath, wheezing, sleep apnea, frequent cough GI:  Negative for nausea, vomiting, bloody stool, GERD  Physical exam: BP 135/79   Pulse 70   Ht 5\' 4"  (1.626 m)   Wt 190 lb (86.2 kg)   BMI 32.61 kg/m  GENERAL APPEARANCE:  Well appearing, well developed, well nourished, NAD HEENT:  Atraumatic, normocephalic, oropharynx clear NECK:  Supple without lymphadenopathy or thyromegaly ABDOMEN:  Soft, non-tender, no masses EXTREMITIES:  Moves all extremities well, without clubbing, cyanosis, or edema NEUROLOGIC:  Alert and oriented x 3, normal gait, CN II-XII grossly intact MENTAL STATUS:  appropriate BACK:  Non-tender to palpation, No CVAT SKIN:  Warm, dry, and intact  Results: U/A: 0-5 WBC, 0-2 RBC

## 2024-02-29 ENCOUNTER — Other Ambulatory Visit: Payer: Self-pay | Admitting: Sports Medicine

## 2024-02-29 DIAGNOSIS — M7061 Trochanteric bursitis, right hip: Secondary | ICD-10-CM

## 2024-03-03 NOTE — Telephone Encounter (Signed)
 [facility] pharmacy requesting med refill for celecoxib. Rx not listed in active med list.

## 2024-03-04 ENCOUNTER — Encounter: Payer: Self-pay | Admitting: Sports Medicine

## 2024-03-04 ENCOUNTER — Encounter: Payer: Self-pay | Admitting: Physical Therapy

## 2024-03-04 ENCOUNTER — Ambulatory Visit (INDEPENDENT_AMBULATORY_CARE_PROVIDER_SITE_OTHER): Payer: Medicare Other | Admitting: Sports Medicine

## 2024-03-04 ENCOUNTER — Ambulatory Visit: Attending: Sports Medicine | Admitting: Physical Therapy

## 2024-03-04 DIAGNOSIS — M25551 Pain in right hip: Secondary | ICD-10-CM | POA: Insufficient documentation

## 2024-03-04 DIAGNOSIS — G894 Chronic pain syndrome: Secondary | ICD-10-CM | POA: Diagnosis not present

## 2024-03-04 DIAGNOSIS — M25552 Pain in left hip: Secondary | ICD-10-CM | POA: Insufficient documentation

## 2024-03-04 DIAGNOSIS — M170 Bilateral primary osteoarthritis of knee: Secondary | ICD-10-CM | POA: Diagnosis not present

## 2024-03-04 DIAGNOSIS — M6281 Muscle weakness (generalized): Secondary | ICD-10-CM | POA: Diagnosis present

## 2024-03-04 DIAGNOSIS — R42 Dizziness and giddiness: Secondary | ICD-10-CM | POA: Insufficient documentation

## 2024-03-04 NOTE — Assessment & Plan Note (Signed)
 This is a pleasant 73 year old female, she is a chronic pain syndrome, she has bilateral knee osteoarthritis, she has had multiple injections the last of which was in October, she continues to get recurrences of pain, her insurance did not hide viscosupplementation. She has had physical therapy, multiple oral medications, currently on oral Dilaudid with her pain management provider. She is not a candidate for arthroplasty. Due to failure of conservative treatment I would like her to consult for geniculate artery embolization.

## 2024-03-04 NOTE — Progress Notes (Signed)
    Procedures performed today:    None.  Independent interpretation of notes and tests performed by another provider:   None.  Brief History, Exam, Impression, and Recommendations:    Primary osteoarthritis of both knees This is a pleasant 73 year old female, she is a chronic pain syndrome, she has bilateral knee osteoarthritis, she has had multiple injections the last of which was in October, she continues to get recurrences of pain, her insurance did not hide viscosupplementation. She has had physical therapy, multiple oral medications, currently on oral Dilaudid with her pain management provider. She is not a candidate for arthroplasty. Due to failure of conservative treatment I would like her to consult for geniculate artery embolization.  Chronic pain syndrome Heather Thompson does have a chronic pain syndrome. She has had multiple interventions into her neck and back including radiofrequency ablations, epidurals. She has had physical therapy, she has had multiple medications including gabapentin, SSRIs, SNRIs, muscle relaxers all without sufficient efficacy, she has a spinal cord stimulator. She has seen several spine interventionalists. She is currently on Dilaudid prescribed by Dr. Oneal Grout. She tells me she is not using the oral Dilaudid as often as she should be and continues to stay in pain, she is worried about getting hooked. I advised her was there for reason, and that she needed to use it as prescribed by Dr. Oneal Grout to get maximum relief. She understands the limited answers we have for chronic pain syndrome patients. She understands there is no cure for her cervical and lumbar spondylosis. She will take the hydromorphone as prescribed, I do not know that I have much more of a role as a sports medicine provider, she can see me as needed.    ____________________________________________ Ihor Austin. Benjamin Stain, M.D., ABFM., CAQSM., AME. Primary Care and Sports Medicine [place]  [facility]  Adjunct Professor of Family Medicine  [employer] [employer]  Restaurant manager, fast food

## 2024-03-04 NOTE — Therapy (Signed)
 OUTPATIENT PHYSICAL THERAPY LOWER EXTREMITY and VESTIBULAR   Patient Name: Heather Thompson MRN: [id number] DOB:05-02-1951, 73 y.o., female Today's Date: 03/04/2024  END OF SESSION:  PT End of Session - 03/04/24 1529     Visit Number 17    Number of Visits 26    Date for PT Re-Evaluation 03/19/24    Authorization Type Medicare    Authorization - Visit Number 17    Progress Note Due on Visit 20    PT Start Time 1445    PT Stop Time 1530    PT Time Calculation (min) 45 min    Activity Tolerance Patient tolerated treatment well    Behavior During Therapy [facility] for tasks assessed/performed                             Past Medical History:  Diagnosis Date   Abnormality of gait 04/23/2019   Acquired hypothyroidism 07/29/2021   Allergic rhinitis due to animal hair and dander 12/02/2015   Formatting of this note might be different from the original.  [facility]  Seen by Dr. Rockwell Germany 11/11/15     Plan- Received allergy shots today. Follow up 1 year.     Allergy    Anemia    Anemia    Anxiety    Arthritis of scaphoid-trapezium-trapezoid joint of right hand 09/10/2023   B12 deficiency    Back pain    Chronic fatigue 07/29/2021   Chronic fatigue syndrome    Chronic throat clearing 03/14/2023   Clotting disorder (HCC)    Constipation    Depression with anxiety 04/07/2009   Formatting of this note might be different from the original.  well controlled on current meds, follows with psychiatrist in [place]. Had a   h/o severe depression 5 years ago, and had ECT.     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  B12 deficiency   Iron deficiency      Herniated lumbar intervertebral disc 11/12/2020   High cholesterol    History of blood clots    History of cervical spinal surgery 01/01/2018   Formatting of this note might be different from the original.  Fusion C6 and C7 per 2018 MRI report.     History of left hip replacement 01/14/2018   History of Roux-en-Y gastric bypass    Hoarseness 03/14/2023   Hyperlipidemia 08/19/2010   Hypertension    Hyponatremia 01/17/2018   Hypothyroidism    Hypothyroidism    IBS (irritable bowel syndrome) 04/06/2021   Impaired fasting glucose 08/19/2010   Incontinence 10/30/2013   Formatting of this note might be different from the original. Formatting of this note might be different from the original. Urinary Incontinence     Infertility, female    Insomnia 04/07/2009   Formatting of this note might be different from the original.  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  Followed by [facility]  Seen by Dr. Reginia Naas 03/01/15     S/p changing thoracic spinal cord stimulator generator 08/18/14.     Lumbar spine films show spinal cord generator in left flank area. 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  Follows with Rheumatology in [facility] of this note might be different from the original.  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  Getting iron infusion via port at hematalogy/oncology center at [facility].   Was recently hospitalized in [facility] for severe anemia     Formatting of this note might be different from the original.  on OTC b12 currently     Vitamin D deficiency    Past Surgical History:  Procedure Laterality Date   ABDOMINAL HYSTERECTOMY     APPENDECTOMY     CARPAL TUNNEL RELEASE Bilateral 2023   with thumb  joint replacement   CERVICAL FUSION     CHOLECYSTECTOMY     FOOT SURGERY Bilateral    fusion   GASTRIC BYPASS     SPINAL CORD STIMULATOR INSERTION  07/2023   TOTAL HIP ARTHROPLASTY Bilateral    11/2016 & 05/2017   Patient Active Problem List   Diagnosis Date Noted   Palpitations 10/04/2023   Cardiac murmur 10/04/2023   Allergy    Anemia    Anxiety    B12 deficiency    Back pain    Chronic fatigue syndrome    Clotting disorder (HCC)    Constipation    Edema of both  lower extremities    Fatty liver    Fibromyalgia    Gallbladder problem    High cholesterol    History of blood clots    History of Roux-en-Y gastric bypass    Hypothyroidism    Infertility, female    Joint pain    Kidney stones    Muscle disorder    Osteoarthritis    Prediabetes    Salzmann nodular degeneration    SOBOE (shortness of breath on exertion)    Swallowing difficulty    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   Arthritis of scaphoid-trapezium-trapezoid joint of right hand 09/10/2023   Urge incontinence 08/28/2023   OAB (overactive bladder) 08/28/2023   Lumbar post-laminectomy syndrome 07/06/2023   Snapping hip syndrome, 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   Other myositis, left thigh 04/24/2023   Sacroiliitis, not elsewhere classified (HCC) 04/24/2023   Dysphagia 04/17/2023   Elevated liver enzymes 04/02/2023   Elevated TSH 04/02/2023   Chronic throat clearing 03/14/2023   Globus sensation 03/14/2023   Hoarseness 03/14/2023   Muscle tension dysphonia 03/12/2023   Unspecified inflammatory spondylopathy, 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   Primary osteoarthritis of both knees 12/21/2021   Port-A-Cath in place 07/29/2021   Acquired hypothyroidism 07/29/2021   Chronic pain syndrome 07/29/2021   Vitamin B12 deficiency 07/29/2021   Vitamin D deficiency 07/29/2021   Postmenopausal estrogen deficiency 07/29/2021   IBS (irritable bowel syndrome) 04/06/2021   Retention of urine 04/06/2021   Herniated  lumbar intervertebral disc 11/12/2020   Skin sensation disturbance 11/12/2020   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   Spondylopathy, unspecified 11/12/2020   Trochanteric bursitis of both hips 11/19/2019   Low serum iron 04/23/2019   Osteomalacia 04/23/2019   Abnormality of gait 04/23/2019   History of bilateral hip arthroplasty 06/26/2018   Gastroesophageal reflux disease 06/18/2018   Hypertension 06/18/2018   Intertrigo 04/30/2018   Lentigines 04/30/2018   Hyponatremia 01/17/2018   History of cervical spinal surgery 01/01/2018   Obesity (BMI 30-39.9) 01/01/2018   SK (seborrheic keratosis) 04/18/2017   Pain management contract agreement 11/17/2016   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   Allergic rhinitis due to animal hair and dander 12/02/2015   Low back pain 03/03/2015   Incontinence 10/30/2013   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   Neutrophilic leukocytosis 08/12/2012   Thrombocytosis 08/12/2012   Osteoporosis, senile 11/15/2010   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   Hyperlipidemia 08/19/2010   Impaired fasting glucose 08/19/2010   Polyarthritis 09/28/2009   Degenerative disc disease 04/07/2009   Depression with anxiety 04/07/2009   Myopathy 04/07/2009   Insomnia 04/07/2009   Migraine 04/07/2009   Scoliosis 04/07/2009   Seasonal allergies 04/07/2009   Vitamin B12 deficiency anemia 04/07/2009    PCP: Christen Butter  REFERRING PROVIDER: Verdell Carmine DIAG: bilateral trochanteric bursitis, vertigo  THERAPY DIAG:  Bilateral hip pain  Muscle weakness (generalized)  Dizziness and giddiness  Rationale for Evaluation and Treatment: Rehabilitation  ONSET DATE: 04/2023  SUBJECTIVE:   SUBJECTIVE STATEMENT: Pt reports her MRI for vestibular schwannoma came back negative. She had cervical nerve block which lasted for about "3 days" and now the pain is back. She saw Dr. Karie Schwalbe who referred her for MRI for her back and recommends a vascular procedure to help with Rt knee pain.  She states that her hips still hurt and she is having difficulty getting her Rt LE into a car    PERTINENT HISTORY: Bilat THA 2018, fibromyalgia, OA, spinal cord stimulator, bilat TC joint fusion  Pt had bilateral hip replacements  in 2018. Pt began having Lt hip and groin pain last summer that was so severe she went to ED. She was eventually diagnosed with bursitis. Since that time the pain has continued and is "always there". She now has bilateral hip pain, groin pain. Hips are tender to touch. Pain increases with laying on her sides, pain with sit to stand, prolonged standing and walking. Pt has not found a way to relieve pain  Vestibular eval: pt states dizziness has been going on for "quite a while". Pt states she feels "room is spinning" when she goes from sit to stand. She reports she also feels that sensation when she goes from supine to sit but it is not as severe. She also states she veers to the right while walking. She also states that if she is standing still she will occasionally feel like she is "tipping over" to the right. The sensation only lasts a few seconds. Pt states she has had a cardiac work up which ruled out cardiac cause of dizziness PAIN:  Are you having pain? Yes: NPRS scale: 7/10 currently, 8-9/10 at worst Pain location: bilat hips Lt > Rt Pain description: sore, tender Aggravating factors: sitting in recliner, laying on sides, sit to stand Relieving factors: unknown  PRECAUTIONS: pt has spinal cord stimulator  RED FLAGS: None   WEIGHT BEARING RESTRICTIONS: No  FALLS:  Has patient fallen in last 6 months? No  OCCUPATION: retired  PLOF: Independent  PATIENT GOALS: reduce pain  NEXT MD VISIT: February 2025  OBJECTIVE:  Note: Objective measures were completed at Evaluation unless otherwise noted.  DIAGNOSTIC FINDINGS: x ray performed but not read at time as eval  PATIENT SURVEYS:  FOTO 39   MUSCLE LENGTH: Hamstrings: decreased bilat Hip flexors:  decreased bilat   PALPATION: TTP bilat hip flexors, ITB, glutes, HS Increased mm spasticity bilat hamstrings, glutes, quads  LOWER EXTREMITY ROM:  Active ROM Right eval Left eval  Hip flexion    Hip extension    Hip abduction    Hip adduction    Hip internal rotation    Hip external rotation 30 50  Knee flexion    Knee extension    Ankle dorsiflexion    Ankle plantarflexion    Ankle inversion    Ankle eversion     (Blank rows = not tested)  LOWER EXTREMITY MMT:  MMT Right eval Left eval  Hip flexion 3+ 3  Hip extension 3 3  Hip abduction 3 3  Hip adduction    Hip internal rotation    Hip external rotation 3 3+  Knee flexion    Knee extension    Ankle dorsiflexion    Ankle plantarflexion    Ankle inversion    Ankle eversion     (Blank rows = not tested)   FUNCTIONAL TESTS:  5 times sit to stand: 18.85 seconds  VESTIBULAR ASSESSMENT:     SYMPTOM BEHAVIOR:  Subjective history: pt states dizziness has been going on for "quite a while". Pt states she feels "room is spinning" when she goes from sit to stand. She reports she also feels that sensation when she goes from supine to sit but it is not as severe. She also states she veers to the right while walking. She also states that if she is standing still she will occasionally feel like she is "tipping over" to the right. The sensation only lasts a few seconds.  Non-Vestibular symptoms:  none reported  Type of dizziness: Spinning/Vertigo  Frequency: multiple times a day  Duration: seconds  Aggravating factors: Induced by position change: supine to sit, sit to stand, and walking  Relieving factors: slow movements  Progression of symptoms: unchanged  OCULOMOTOR EXAM:  Ocular Alignment: normal  Ocular ROM: No Limitations  Spontaneous Nystagmus: absent  Gaze-Induced Nystagmus: age appropriate nystagmus at end range  Smooth Pursuits: saccades and pt reports "spinning" sensation when focusing on pen  Saccades:  intact     VESTIBULAR - OCULAR REFLEX:   Slow VOR: Positive Bilaterally increased spinning sensation  VOR Cancellation: Comment: increased spinning sensation  Head-Impulse Test: HIT Right: positive HIT Left: negative     POSITIONAL TESTING: Right Dix-Hallpike: no nystagmus Left Dix-Hallpike: no nystagmus Right Roll Test: no nystagmus Left Roll Test: no nystagmus   MOTION SENSITIVITY:    Motion Sensitivity Quotient Intensity: 0 = none, 1 = Lightheaded, 2 = Mild, 3 = Moderate, 4 = Severe, 5 = Vomiting  Intensity  1. Sitting to supine   2. Supine to L side   3. Supine to R side   4. Supine to sitting   5. L Hallpike-Dix   6. Up from L    7. R Hallpike-Dix   8. Up from R    9. Sitting, head  tipped to L knee   10. Head up from L  knee   11. Sitting, head  tipped to R knee   12. Head up from R  knee   13. Sitting head turns x5   14.Sitting head nods x5   15. In stance, 180  turn to L    16. In stance, 180  turn to R      [facility] Adult PT Treatment:                                                DATE: 03/04/24 Therapeutic Exercise/Activity: Seated hip flexion with red TB resistance 2 x 10 Seated Hip flexion with abd/add x 10 - pain Seated hip add ball squeeze 10 x 3 sec hold Seated hip abd isometric 10 x 3 sec hold TKE ball on wall x 12 bilat  Neuromuscular re-ed: Habituation reclined to sitting upright   [facility] Adult PT Treatment:                                                DATE: 02/20/24 Therapeutic Exercise/Activity: Standing hip flexion submax isometric ball on wall x 12 TKE ball on wall x 12 bilat Seated hip add ball squeeze 10 x 3 sec hold Seated hip abd isometric 10 x 3 sec hold Standing hip ext isometric x 10 bilat Seated ab isometric with physioball Seated oblique isometric with physioball  Neuromuscular re-ed: Habituation reclined to sitting upright  [facility] Adult PT Treatment:                                                DATE:  02/15/24 Therapeutic Exercise/Activity/NMR: Standing hip flexion submax isometric ball on wall x 12 TKE ball on wall x 12 bilat Seated hip add ball squeeze 10 x 3 sec hold Seated  hip abd isometric 10 x 3 sec hold Standing hip ext isometric x 10 bilat Seated ab isometric with physioball Seated oblique isometric with physioball   [facility] Adult PT Treatment:                                                DATE: 02/12/24 Therapeutic Exercise/Activity/NMR: Long sitting Quad set (submax) 2 x 10 Standing hip flexion submax isometric ball on wall x 12 Attempted hip abd submax isometric - unable due to pain TKE ball on wall submax contraction Rt x 12 Hip IR with ball squeeze in tolerable range on Rt x 10 Standing hip ext with knee bent in tolerable range x 8 on Rt SLS on Rt 2 x 30 sec intermittent UE support VOR x 1 vertical and horizontal with marching with UE support on counter  Self Care: Review of HEP, prognosis, review of relevant anatomy and rationale for treatment                                                                                                                        PATIENT EDUCATION:  Education details: PT POC and goals, HEP Person educated: Patient Education method: Explanation, Demonstration, and Handouts Education comprehension: verbalized understanding and returned demonstration  HOME EXERCISE PROGRAM: Access Code: [email or web address] URL: [email or web address] Date: 02/12/2024 Prepared by: Reggy Eye  Exercises - Sit to Stand  - 1 x daily - 7 x weekly - 2 sets - 10 reps - Seated Hamstring Stretch  - 1 x daily - 7 x weekly - 1 sets - 3 reps - 20-30 seconds hold - Seated Figure 4 Piriformis Stretch  - 1 x daily - 7 x weekly - 1 sets - 3 reps - 20-30 seconds hold - Standing Hip Abduction with Bent Knee  - 1 x daily - 7 x weekly - 3 sets - 10 reps - Standing Hip Extension with Leg Bent and Support  - 1 x daily - 7 x weekly - 3 sets - 10 reps - Single Leg 3  Way Reach with Support  - 1 x daily - 7 x weekly - 3 sets - 10 reps - Seated Hip Internal Rotation with Ball and Resistance  - 1 x daily - 7 x weekly - 3 sets - 10 reps - Seated Hip Flexor Stretch  - 1 x daily - 7 x weekly - 1 sets - 3 reps - 30 seconds hold - Resistance Pulldown with March  - 1 x daily - 7 x weekly - 3 sets - 10 reps - Standing Bilateral Low Shoulder Row with Anchored Resistance  - 1 x daily - 7 x weekly - 3 sets - 10 reps - Standing Anti-Rotation Press with Anchored Resistance  - 1 x daily - 7 x weekly - 3 sets - 10 reps - Seated Gaze Stabilization with  Head Nod  - 3 x daily - 7 x weekly - 3 sets - 10 reps - Seated Gaze Stabilization with Head Rotation  - 3 x daily - 7 x weekly - 3 sets - 10 reps - Seated VOR Cancellation  - 1 x daily - 7 x weekly - 3 sets - 10 reps - Supine Quad Set  - 1 x daily - 7 x weekly - 2 sets - 10 reps - Standing Isometric Hip Flexion with Ball at [employer]  - 1 x daily - 7 x weekly - 2 sets - 10 reps - Single Leg Stance with Support  - 1 x daily - 7 x weekly - 1 sets - 3 reps - 20-30 seconds hold  ASSESSMENT:  CLINICAL IMPRESSION: Pt continues to make slow steady progress with vestibular rehabilitation. She is able to recline more with decreased symptoms. Her hip pain continues to increase despite improving tolerance to isometric strengthening exercises  OBJECTIVE IMPAIRMENTS: decreased activity tolerance, decreased mobility, difficulty walking, decreased ROM, decreased strength, increased muscle spasms, impaired flexibility, and pain.     GOALS: Goals reviewed with patient? Yes  SHORT TERM GOALS: Target date: 01/09/2024   Pt will be independent with initial HEP Baseline: Goal status: MET  2.  Pt will improve FOTO to > = 45 to demo improving functional mobility Baseline: 33 on 01/11/24 Goal status: IN PROGRESS   LONG TERM GOALS: Target date: 03/19/2024      Pt will be independent with advanced HEP and plan for continued exercise in  community Baseline:  Goal status: INITIAL  2.  Pt will improve FOTO to >= 50 to demo improved functional mobility Baseline:  Goal status: INITIAL  3.  Pt will improve hip strength to 4+/5 bilat to improve standing and walking tolerance Baseline:  Goal status: INITIAL  4.  Pt will tolerate laying on both sides with pain <= 2/10 Baseline:  Goal status: INITIAL  5.  Pt will improve 5 x STS to <= 15 seconds Baseline:  Goal status: INITIAL  6.  Pt will report transitioning sit to stand without dizziness symptoms 75% of the time Baseline:  Goal status: INITIAL 7.  Pt will demo decreased risk of falls with DGI >= 21/24 Baseline: 18/24 Goal status: INITIAL       PLAN:  PT FREQUENCY: 2x/week  PT DURATION: 8 weeks  PLANNED INTERVENTIONS: 97164- PT Re-evaluation, 97110-Therapeutic exercises, 97530- Therapeutic activity, 97112- Neuromuscular re-education, 97535- Self Care, 16109- Manual therapy, [id number]- Aquatic Therapy, 97035- Ultrasound, [id number]- Ionotophoresis 4mg /ml Dexamethasone, Patient/Family education, Balance training, Taping, Dry Needling, Cryotherapy, and Moist heat  PLAN FOR NEXT SESSION: vestibular, balance, hip stability, hip strengthening   Leanard Dimaio, PT 03/04/2024, 3:30 PM

## 2024-03-04 NOTE — Assessment & Plan Note (Signed)
 Heather Thompson does have a chronic pain syndrome. She has had multiple interventions into her neck and back including radiofrequency ablations, epidurals. She has had physical therapy, she has had multiple medications including gabapentin, SSRIs, SNRIs, muscle relaxers all without sufficient efficacy, she has a spinal cord stimulator. She has seen several spine interventionalists. She is currently on Dilaudid prescribed by Dr. Oneal Grout. She tells me she is not using the oral Dilaudid as often as she should be and continues to stay in pain, she is worried about getting hooked. I advised her was there for reason, and that she needed to use it as prescribed by Dr. Oneal Grout to get maximum relief. She understands the limited answers we have for chronic pain syndrome patients. She understands there is no cure for her cervical and lumbar spondylosis. She will take the hydromorphone as prescribed, I do not know that I have much more of a role as a sports medicine provider, she can see me as needed.

## 2024-03-05 ENCOUNTER — Ambulatory Visit: Payer: Medicare Other

## 2024-03-06 ENCOUNTER — Inpatient Hospital Stay: Payer: Medicare Other

## 2024-03-06 ENCOUNTER — Ambulatory Visit (INDEPENDENT_AMBULATORY_CARE_PROVIDER_SITE_OTHER): Admitting: Professional

## 2024-03-06 ENCOUNTER — Encounter: Payer: Self-pay | Admitting: Professional

## 2024-03-06 DIAGNOSIS — F332 Major depressive disorder, recurrent severe without psychotic features: Secondary | ICD-10-CM

## 2024-03-06 DIAGNOSIS — F411 Generalized anxiety disorder: Secondary | ICD-10-CM | POA: Diagnosis not present

## 2024-03-06 NOTE — Progress Notes (Signed)
   Heather Thompson, [facility]

## 2024-03-06 NOTE — Progress Notes (Signed)
 [facility] Counselor/Therapist Progress Note  Patient ID: Heather Thompson, MRN: [id number],    Date: 03/06/2024  Time Spent: 59 minutes [id number]  Treatment Type: Individual Therapy  Risk Assessment: Danger to Self:  No; pt commented on having pain medication that is lethal and upon further questioning she stated there is nothing that couple happen that would ever cause her to attempt suicide Self-injurious Behavior: No Danger to Others: No  Subjective: The patient arrived on time for her inperson appointment  Issues addressed: 1-sleep hygiene -educated 2-Treatment planning -"I need help working on on my depression which I don't think is going anywhere". -"my grief- How to not feel lonely all the time, how not to feel miserable all the time" -"My weight loss and eating issue-How to handle my over eating so that I'm not eating constantly. When my husband passed, never never never in my life did I take food to bed." She now grabs a snack to take to bed and admits it is the worst time to eat and  I don't know if there's any kind of help to stop doing that. -"My loneliness-How to be able to handle being alone without having a pity party because I'm alone"; pt would like to do crafts but doesn't see it as fun she does so by herself -"my health issues that I worry about-Spine and legs are weak and really hurt "to stop worrying about the future" I have no control over it and it goes back to my feeling about my family and I am not going to get any help from them and end in a nursing home -"To stop being so angry at my kids because they threw me away and to stop worrying about the future because I beat what the doctors told me I wouldn't be here in 5 years  Treatment Plan Problems:   Diagnosis:Generalized anxiety disorder  Severe episode of recurrent major depressive disorder, without psychotic features (HCC)  Plan:  -pt to research social opportunities in community to increase  her structure and reduce loneliness -meet again on Thursday, March 13, 2024 at 4pm.

## 2024-03-07 ENCOUNTER — Ambulatory Visit: Admitting: Physical Therapy

## 2024-03-07 ENCOUNTER — Inpatient Hospital Stay: Attending: Hematology & Oncology

## 2024-03-07 VITALS — BP 132/70 | HR 70 | Temp 98.4°F | Resp 18

## 2024-03-07 DIAGNOSIS — E611 Iron deficiency: Secondary | ICD-10-CM | POA: Insufficient documentation

## 2024-03-07 DIAGNOSIS — Z452 Encounter for adjustment and management of vascular access device: Secondary | ICD-10-CM | POA: Insufficient documentation

## 2024-03-07 DIAGNOSIS — K912 Postsurgical malabsorption, not elsewhere classified: Secondary | ICD-10-CM | POA: Insufficient documentation

## 2024-03-07 DIAGNOSIS — Z95828 Presence of other vascular implants and grafts: Secondary | ICD-10-CM

## 2024-03-07 MED ORDER — HEPARIN SOD (PORK) LOCK FLUSH 100 UNIT/ML IV SOLN
500.0000 [IU] | Freq: Once | INTRAVENOUS | Status: AC
  Administered 2024-03-07: 500 [IU] via INTRAVENOUS

## 2024-03-07 MED ORDER — SODIUM CHLORIDE 0.9% FLUSH
10.0000 mL | Freq: Once | INTRAVENOUS | Status: AC
Start: 2024-03-07 — End: 2024-03-07
  Administered 2024-03-07: 10 mL via INTRAVENOUS

## 2024-03-07 NOTE — Patient Instructions (Signed)

## 2024-03-11 ENCOUNTER — Other Ambulatory Visit (HOSPITAL_COMMUNITY): Payer: Self-pay | Admitting: Psychiatry

## 2024-03-11 ENCOUNTER — Other Ambulatory Visit: Payer: Self-pay | Admitting: Medical-Surgical

## 2024-03-11 ENCOUNTER — Encounter: Payer: Self-pay | Admitting: Emergency Medicine

## 2024-03-11 ENCOUNTER — Encounter: Payer: Self-pay | Admitting: Physical Therapy

## 2024-03-11 ENCOUNTER — Ambulatory Visit
Admission: EM | Admit: 2024-03-11 | Discharge: 2024-03-11 | Disposition: A | Discharge status: Home / Self Care | Attending: Family Medicine | Admitting: Family Medicine

## 2024-03-11 ENCOUNTER — Ambulatory Visit: Admitting: Physical Therapy

## 2024-03-11 DIAGNOSIS — R42 Dizziness and giddiness: Secondary | ICD-10-CM

## 2024-03-11 DIAGNOSIS — M25511 Pain in right shoulder: Secondary | ICD-10-CM

## 2024-03-11 DIAGNOSIS — G4700 Insomnia, unspecified: Secondary | ICD-10-CM

## 2024-03-11 DIAGNOSIS — M5416 Radiculopathy, lumbar region: Secondary | ICD-10-CM

## 2024-03-11 DIAGNOSIS — M6281 Muscle weakness (generalized): Secondary | ICD-10-CM

## 2024-03-11 DIAGNOSIS — M25551 Pain in right hip: Secondary | ICD-10-CM | POA: Diagnosis not present

## 2024-03-11 DIAGNOSIS — M25552 Pain in left hip: Secondary | ICD-10-CM

## 2024-03-11 MED ORDER — OXYCODONE-ACETAMINOPHEN 7.5-325 MG PO TABS: 1.0000 | ORAL_TABLET | Freq: Three times a day (TID) | ORAL | 0 refills | Status: DC | PRN

## 2024-03-11 NOTE — ED Provider Notes (Signed)
 [facility]    CSN: [id number] Arrival date & time: 03/11/24  1741      History   Chief Complaint Chief Complaint  Patient presents with   Back Pain    HPI Heather Thompson is a 73 y.o. female.   HPI 73 year old female presents with back pain under her right shoulder blade for 5 days.  Reports was evaluated in physical therapy this morning and pain has become worse over the day.  Patient reports taking prescribed hydromorphone earlier with no relief.  Patient is followed by neurosurgery for lower back pain.  PMH significant for fibromyalgia, history of gastric bypass and blood clots.  Past Medical History:  Diagnosis Date   Abnormality of gait 04/23/2019   Acquired hypothyroidism 07/29/2021   Allergic rhinitis due to animal hair and dander 12/02/2015   Formatting of this note might be different from the original.  [facility]  Seen by Dr. Rockwell Germany 11/11/15     Plan- Received allergy shots today. Follow up 1 year.     Allergy    Anemia    Anemia    Anxiety    Arthritis of scaphoid-trapezium-trapezoid joint of right hand 09/10/2023   B12 deficiency    Back pain    Chronic fatigue 07/29/2021   Chronic fatigue syndrome    Chronic throat clearing 03/14/2023   Clotting disorder (HCC)    Constipation    Depression with anxiety 04/07/2009   Formatting of this note might be different from the original.  well controlled on current meds, follows with psychiatrist in [place]. Had a   h/o severe depression 5 years ago, and had ECT.     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  B12 deficiency   Iron deficiency     Herniated lumbar intervertebral disc  11/12/2020   High cholesterol    History of blood clots    History of cervical spinal surgery 01/01/2018   Formatting of this note might be different from the original.  Fusion C6 and C7 per 2018 MRI report.     History of left hip replacement 01/14/2018   History of Roux-en-Y gastric bypass    Hoarseness 03/14/2023   Hyperlipidemia 08/19/2010   Hypertension    Hyponatremia 01/17/2018   Hypothyroidism    Hypothyroidism    IBS (irritable bowel syndrome) 04/06/2021   Impaired fasting glucose 08/19/2010   Incontinence 10/30/2013   Formatting of this note might be different from the original. Formatting of this note might be different from the original. Urinary Incontinence     Infertility, female    Insomnia 04/07/2009   Formatting of this note might be different from the original.  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  Followed by [facility]  Seen by Dr. Reginia Naas 03/01/15     S/p changing thoracic spinal cord stimulator generator 08/18/14.     Lumbar spine films show spinal  cord generator in left flank area. 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  Follows with Rheumatology in [facility] of this note might be different from the original.  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  Getting iron infusion via port at hematalogy/oncology center at [facility].   Was recently hospitalized in [facility] for severe anemia     Formatting of this note might be different from the original.  on OTC b12 currently     Vitamin D deficiency     Patient Active Problem List   Diagnosis Date Noted   Palpitations 10/04/2023   Cardiac murmur 10/04/2023   Allergy    Anemia    Anxiety    B12 deficiency    Back pain    Chronic  fatigue syndrome    Clotting disorder (HCC)    Constipation    Edema of both lower extremities    Fatty liver    Fibromyalgia    Gallbladder problem    High cholesterol    History of blood clots    History of Roux-en-Y gastric bypass    Hypothyroidism    Infertility, female    Joint pain    Kidney stones    Muscle disorder    Osteoarthritis    Prediabetes    Salzmann nodular degeneration    SOBOE (shortness of breath on exertion)    Swallowing difficulty    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   Arthritis of scaphoid-trapezium-trapezoid joint of right hand 09/10/2023   Urge incontinence 08/28/2023   OAB (  overactive bladder) 08/28/2023   Lumbar post-laminectomy syndrome 07/06/2023   Snapping hip syndrome, 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   Other myositis, left thigh 04/24/2023   Sacroiliitis, not elsewhere classified (HCC) 04/24/2023   Dysphagia 04/17/2023   Elevated liver enzymes 04/02/2023   Elevated TSH 04/02/2023   Chronic throat clearing 03/14/2023   Globus sensation 03/14/2023   Hoarseness 03/14/2023   Muscle tension dysphonia 03/12/2023   Unspecified inflammatory spondylopathy, 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   Primary osteoarthritis of both knees 12/21/2021   Port-A-Cath in place 07/29/2021   Acquired hypothyroidism 07/29/2021   Chronic pain syndrome 07/29/2021   Vitamin B12 deficiency 07/29/2021   Vitamin D deficiency 07/29/2021   Postmenopausal estrogen deficiency 07/29/2021   IBS (irritable bowel syndrome) 04/06/2021   Retention of urine 04/06/2021   Herniated lumbar intervertebral disc 11/12/2020   Skin sensation disturbance 11/12/2020   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   Spondylopathy, unspecified 11/12/2020   Trochanteric bursitis of both hips 11/19/2019   Low serum iron 04/23/2019   Osteomalacia 04/23/2019   Abnormality of gait 04/23/2019   History of bilateral hip arthroplasty 06/26/2018   Gastroesophageal reflux disease 06/18/2018    Hypertension 06/18/2018   Intertrigo 04/30/2018   Lentigines 04/30/2018   Hyponatremia 01/17/2018   History of cervical spinal surgery 01/01/2018   Obesity (BMI 30-39.9) 01/01/2018   SK (seborrheic keratosis) 04/18/2017   Pain management contract agreement 11/17/2016   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   Allergic rhinitis due to animal hair and dander 12/02/2015   Low back pain 03/03/2015   Incontinence 10/30/2013   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   Neutrophilic leukocytosis 08/12/2012   Thrombocytosis 08/12/2012   Osteoporosis, senile 11/15/2010   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   Hyperlipidemia 08/19/2010   Impaired fasting glucose 08/19/2010   Polyarthritis 09/28/2009   Degenerative disc disease 04/07/2009   Depression with anxiety 04/07/2009   Myopathy 04/07/2009   Insomnia 04/07/2009   Migraine 04/07/2009   Scoliosis 04/07/2009   Seasonal allergies 04/07/2009   Vitamin B12 deficiency anemia 04/07/2009    Past Surgical History:  Procedure Laterality Date   ABDOMINAL HYSTERECTOMY     APPENDECTOMY     CARPAL TUNNEL RELEASE Bilateral 2023   with thumb joint replacement   CERVICAL FUSION     CHOLECYSTECTOMY     FOOT SURGERY Bilateral    fusion   GASTRIC BYPASS     SPINAL CORD STIMULATOR INSERTION  07/2023   TOTAL HIP ARTHROPLASTY Bilateral    11/2016 & 05/2017    OB History     Gravida  2   Para  2   Term      Preterm      AB      Living         SAB      IAB      Ectopic      Multiple      Live Births               Home Medications    Prior to Admission medications   Medication Sig Start Date End Date Taking? Authorizing Provider  buPROPion (WELLBUTRIN XL) 300 MG 24 hr tablet Take 1 tablet (300 mg total) by mouth daily. Take in the evening 01/16/24  Yes Cherre Cornish, NP  desvenlafaxine (PRISTIQ) 100 MG 24 hr tablet Take 1 tablet (100 mg total) by mouth daily. 01/22/24  Yes Wray Heady, MD  FLUoxetine (PROZAC) 10 MG capsule Take 1 capsule (10 mg  total) by mouth daily. 01/22/24  Yes  Thresa Ross, MD  gabapentin (NEURONTIN) 600 MG tablet Take 1 tablet (600 mg total) by mouth 3 (three) times daily. 01/23/24  Yes Monica Becton, MD  Magnesium 250 MG TABS Take 1 tablet by mouth daily.   Yes [provider]  Multiple Vitamin (MULTI-VITAMIN) tablet Take 1 tablet by mouth daily.   Yes [provider]  nystatin (MYCOSTATIN/NYSTOP) powder APPLY TO THE AFFECTED AREA(S) EXTERNALLY TWICE DAILY 02/05/24  Yes Christen Butter, NP  oxyCODONE-acetaminophen (PERCOCET) 7.5-325 MG tablet Take 1 tablet by mouth every 8 (eight) hours as needed for severe pain (pain score 7-10). 03/11/24  Yes Trevor Iha, FNP  propranolol (INDERAL) 20 MG tablet TAKE 1 TABLET(20 MG) BY MOUTH DAILY AS NEEDED 06/01/23  Yes Christen Butter, NP  solifenacin (VESICARE) 10 MG tablet Take 1 tablet (10 mg total) by mouth daily. 02/28/24  Yes Stoneking, Danford Bad., MD  temazepam (RESTORIL) 22.5 MG capsule TAKE ONE CAPSULE BY MOUTH AT NIGHT 11/09/23  Yes Jessup, Joy, NP  tiZANidine (ZANAFLEX) 2 MG tablet TAKE 1 TABLET (2 MG TOTAL) BY MOUTH IN THE MORNING AND AT BEDTIME. 06/25/23  Yes Breeback, Jade L, PA-C  lisinopril (ZESTRIL) 10 MG tablet Take 1 tablet (10 mg total) by mouth daily. 10/04/23 02/21/24  Revankar, Aundra Dubin, MD    Family History Family History  Problem Relation Age of Onset   Hyperlipidemia Mother    Hypertension Mother    Heart attack Mother    Heart disease Mother    Sudden death Mother    Depression Mother    Anxiety disorder Mother    Obesity Mother    Anxiety disorder Father    Depression Father    Cancer Father    Hyperlipidemia Father    Hypertension Father    Lung cancer Father    Prostate cancer Father    Kidney cancer Brother    Healthy Daughter    Healthy Son     Social History Social History   Tobacco Use   Smoking status: Never    Passive exposure: Past   Smokeless tobacco: Never  Vaping Use   Vaping status: Never Used   Substance Use Topics   Alcohol use: Yes    Comment: rarely   Drug use: Never     Allergies   Compazine [prochlorperazine], Phenothiazines, Prednisone, and Pregabalin   Review of Systems Review of Systems  Musculoskeletal:        Patient reports severe/acute right posterior shoulder pain as well as lower back pain  All other systems reviewed and are negative.    Physical Exam Triage Vital Signs ED Triage Vitals  Encounter Vitals Group     BP      Systolic BP Percentile      Diastolic BP Percentile      Pulse      Resp      Temp      Temp src      SpO2      Weight      Height      Head Circumference      Peak Flow      Pain Score      Pain Loc      Pain Education      Exclude from Growth Chart    No data found.  Updated Vital Signs BP (!) 157/85 (BP Location: Right Arm)   Pulse (!) 108   Temp 99.8 F (37.7 C) (Oral)   Resp 18   SpO2 100%  Physical Exam Vitals and nursing note reviewed.  Constitutional:      Appearance: Normal appearance. She is obese.  HENT:     Head: Normocephalic and atraumatic.     Mouth/Throat:     Mouth: Mucous membranes are moist.     Pharynx: Oropharynx is clear.  Eyes:     Extraocular Movements: Extraocular movements intact.     Conjunctiva/sclera: Conjunctivae normal.     Pupils: Pupils are equal, round, and reactive to light.  Cardiovascular:     Rate and Rhythm: Normal rate and regular rhythm.     Pulses: Normal pulses.     Heart sounds: Normal heart sounds.  Pulmonary:     Effort: Pulmonary effort is normal.     Breath sounds: Normal breath sounds. No wheezing, rhonchi or rales.  Musculoskeletal:        General: Normal range of motion.     Cervical back: Normal range of motion and neck supple.  Skin:    General: Skin is warm and dry.  Neurological:     General: No focal deficit present.     Mental Status: She is alert and oriented to person, place, and time. Mental status is at baseline.  Psychiatric:         Mood and Affect: Mood normal.        Behavior: Behavior normal.      UC Treatments / Results  Labs (all labs ordered are listed, but only abnormal results are displayed) Labs Reviewed - No data to display  EKG   Radiology No results found.  Procedures Procedures (including critical care time)  Medications Ordered in UC Medications - No data to display  Initial Impression / Assessment and Plan / UC Course  I have reviewed the triage vital signs and the nursing notes.  Pertinent labs & imaging results that were available during my care of the patient were reviewed by me and considered in my medical decision making (see chart for details).     MDM: 1.  Acute pain of right shoulder-Rx'd Percocet 7.5/325 mg tablet: Take 1 tablet every 8 hours, as needed for acute/severe right shoulder pain; 2.  Lumbar radiculopathy, acute-Rx Percocet 7.5/3 and 25 mg tablet take 1 tablet every 8 hours, as needed for acute/severe lower back pain. Advised patient to take Percocet every 8 hours, as needed for acute/severe of right posterior shoulder area.  Encouraged increase daily water intake to 64 ounces per day while taking this medication.  Advised if symptoms worsening or unresolved please follow-up your PCP, neurosurgery, or here for further evaluation.  Patient discharged home, hemodynamically stable.  Final Clinical Impressions(s) / UC Diagnoses   Final diagnoses:  Acute pain of right shoulder  Lumbar radiculopathy, acute     Discharge Instructions      Advised patient to take Percocet every 8 hours, as needed for acute/severe of right posterior shoulder area.  Encouraged increase daily water intake to 64 ounces per day while taking this medication.  Advised if symptoms worsening or unresolved please follow-up your PCP, neurosurgery, or here for further evaluation.     ED Prescriptions     Medication Sig Dispense Auth. Provider   oxyCODONE-acetaminophen (PERCOCET) 7.5-325 MG tablet  Take 1 tablet by mouth every 8 (eight) hours as needed for severe pain (pain score 7-10). 15 tablet Trevor Iha, FNP      I have reviewed the PDMP during this encounter.   Trevor Iha, FNP 03/11/24 [facility] [place]

## 2024-03-11 NOTE — ED Triage Notes (Addendum)
 Patient states she has a history of back pain, pain under right shoulder blade x 5 days ago.  Patient was seen in PT this morning, the specific area under her right shoulder blade has become worse.  Patient does see Dr Vonn Guard for her neck & back issues.    Seen Dr Elva Hamburger on [facility] for the pain under right shoulder blade and advised to continue following Dr Hollice Luo recommendation.  Patient is currently under a pain contract for narcotics.  Patient did take Hydromorphone 2mg  today.

## 2024-03-11 NOTE — Telephone Encounter (Signed)
 Requesting rx rf of temazepam 22.5mg  Last written 11/09/2023 Last OV 02/13/2024 Upcoming appt = none

## 2024-03-11 NOTE — Discharge Instructions (Addendum)
 Advised patient to take Percocet every 8 hours, as needed for acute/severe of right posterior shoulder area.  Encouraged increase daily water intake to 64 ounces per day while taking this medication.  Advised if symptoms worsening or unresolved please follow-up your PCP, neurosurgery, or here for further evaluation.

## 2024-03-11 NOTE — Therapy (Signed)
 OUTPATIENT PHYSICAL THERAPY LOWER EXTREMITY and VESTIBULAR   Patient Name: Heather Thompson MRN: [id number] DOB:1951/05/26, 73 y.o., female Today's Date: 03/11/2024  END OF SESSION:  PT End of Session - 03/11/24 1315     Visit Number 18    Number of Visits 26    Date for PT Re-Evaluation 03/19/24    Authorization Type Medicare    Authorization - Visit Number 18    Progress Note Due on Visit 20    PT Start Time 1100    PT Stop Time 1145    PT Time Calculation (min) 45 min    Activity Tolerance Patient tolerated treatment well    Behavior During Therapy WFL for tasks assessed/performed                              Past Medical History:  Diagnosis Date   Abnormality of gait 04/23/2019   Acquired hypothyroidism 07/29/2021   Allergic rhinitis due to animal hair and dander 12/02/2015   Formatting of this note might be different from the original.  [facility]  Seen by Dr. Travis Friedman 11/11/15     Plan- Received allergy shots today. Follow up 1 year.     Allergy    Anemia    Anemia    Anxiety    Arthritis of scaphoid-trapezium-trapezoid joint of right hand 09/10/2023   B12 deficiency    Back pain    Chronic fatigue 07/29/2021   Chronic fatigue syndrome    Chronic throat clearing 03/14/2023   Clotting disorder (HCC)    Constipation    Depression with anxiety 04/07/2009   Formatting of this note might be different from the original.  well controlled on current meds, follows with psychiatrist in [place]. Had a   h/o severe depression 5 years ago, and had ECT.     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  B12 deficiency   Iron deficiency      Herniated lumbar intervertebral disc 11/12/2020   High cholesterol    History of blood clots    History of cervical spinal surgery 01/01/2018   Formatting of this note might be different from the original.  Fusion C6 and C7 per 2018 MRI report.     History of left hip replacement 01/14/2018   History of Roux-en-Y gastric bypass    Hoarseness 03/14/2023   Hyperlipidemia 08/19/2010   Hypertension    Hyponatremia 01/17/2018   Hypothyroidism    Hypothyroidism    IBS (irritable bowel syndrome) 04/06/2021   Impaired fasting glucose 08/19/2010   Incontinence 10/30/2013   Formatting of this note might be different from the original. Formatting of this note might be different from the original. Urinary Incontinence     Infertility, female    Insomnia 04/07/2009   Formatting of this note might be different from the original.  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  Followed by [facility]  Seen by Dr. Nyle Belling 03/01/15     S/p changing thoracic spinal cord stimulator generator 08/18/14.     Lumbar spine films show spinal cord generator in left flank area. 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  Follows with Rheumatology in [facility] of this note might be different from the original.  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  Getting iron infusion via port at hematalogy/oncology center at [facility].   Was recently hospitalized in [facility] for severe anemia     Formatting of this note might be different from the original.  on OTC b12 currently     Vitamin D deficiency    Past Surgical History:  Procedure Laterality Date   ABDOMINAL HYSTERECTOMY     APPENDECTOMY     CARPAL TUNNEL RELEASE Bilateral 2023   with thumb  joint replacement   CERVICAL FUSION     CHOLECYSTECTOMY     FOOT SURGERY Bilateral    fusion   GASTRIC BYPASS     SPINAL CORD STIMULATOR INSERTION  07/2023   TOTAL HIP ARTHROPLASTY Bilateral    11/2016 & 05/2017   Patient Active Problem List   Diagnosis Date Noted   Palpitations 10/04/2023   Cardiac murmur 10/04/2023   Allergy    Anemia    Anxiety    B12 deficiency    Back pain    Chronic fatigue syndrome    Clotting disorder (HCC)    Constipation    Edema of  both lower extremities    Fatty liver    Fibromyalgia    Gallbladder problem    High cholesterol    History of blood clots    History of Roux-en-Y gastric bypass    Hypothyroidism    Infertility, female    Joint pain    Kidney stones    Muscle disorder    Osteoarthritis    Prediabetes    Salzmann nodular degeneration    SOBOE (shortness of breath on exertion)    Swallowing difficulty    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   Arthritis of scaphoid-trapezium-trapezoid joint of right hand 09/10/2023   Urge incontinence 08/28/2023   OAB (overactive bladder) 08/28/2023   Lumbar post-laminectomy syndrome 07/06/2023   Snapping hip syndrome, 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   Other myositis, left thigh 04/24/2023   Sacroiliitis, not elsewhere classified (HCC) 04/24/2023   Dysphagia 04/17/2023   Elevated liver enzymes 04/02/2023   Elevated TSH 04/02/2023   Chronic throat clearing 03/14/2023   Globus sensation 03/14/2023   Hoarseness 03/14/2023   Muscle tension dysphonia 03/12/2023   Unspecified inflammatory spondylopathy, 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   Primary osteoarthritis of both knees 12/21/2021   Port-A-Cath in place 07/29/2021   Acquired hypothyroidism 07/29/2021   Chronic pain syndrome 07/29/2021   Vitamin B12 deficiency 07/29/2021   Vitamin D deficiency 07/29/2021   Postmenopausal estrogen deficiency 07/29/2021   IBS (irritable bowel syndrome) 04/06/2021   Retention of urine 04/06/2021   Herniated  lumbar intervertebral disc 11/12/2020   Skin sensation disturbance 11/12/2020   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   Spondylopathy, unspecified 11/12/2020   Trochanteric bursitis of both hips 11/19/2019   Low serum iron 04/23/2019   Osteomalacia 04/23/2019   Abnormality of gait 04/23/2019   History of bilateral hip arthroplasty 06/26/2018   Gastroesophageal reflux disease 06/18/2018   Hypertension 06/18/2018   Intertrigo 04/30/2018   Lentigines 04/30/2018   Hyponatremia 01/17/2018   History of cervical spinal surgery 01/01/2018   Obesity (BMI 30-39.9) 01/01/2018   SK (seborrheic keratosis) 04/18/2017   Pain management contract agreement 11/17/2016   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   Allergic rhinitis due to animal hair and dander 12/02/2015   Low back pain 03/03/2015   Incontinence 10/30/2013   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   Neutrophilic leukocytosis 08/12/2012   Thrombocytosis 08/12/2012   Osteoporosis, senile 11/15/2010   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   Hyperlipidemia 08/19/2010   Impaired fasting glucose 08/19/2010   Polyarthritis 09/28/2009   Degenerative disc disease 04/07/2009   Depression with anxiety 04/07/2009   Myopathy 04/07/2009   Insomnia 04/07/2009   Migraine 04/07/2009   Scoliosis 04/07/2009   Seasonal allergies 04/07/2009   Vitamin B12 deficiency anemia 04/07/2009    PCP: Cherre Cornish  REFERRING PROVIDER: Carlye Child DIAG: bilateral trochanteric bursitis, vertigo  THERAPY DIAG:  Bilateral hip pain  Muscle weakness (generalized)  Dizziness and giddiness  Rationale for Evaluation and Treatment: Rehabilitation  ONSET DATE: 04/2023  SUBJECTIVE:   SUBJECTIVE STATEMENT: Pt reports she continues to have a shooting/stabbing pain under her Rt shoulder blade. MD advised pt that he thinks the pain is coming from her neck. Pt reports she sees MD on Friday about having a knee procedure.    PERTINENT HISTORY: Bilat THA 2018,  fibromyalgia, OA, spinal cord stimulator, bilat TC joint fusion  Pt had bilateral hip replacements in 2018. Pt began having Lt hip and groin pain last summer that was so severe she went to ED. She was eventually diagnosed with bursitis. Since that time  the pain has continued and is "always there". She now has bilateral hip pain, groin pain. Hips are tender to touch. Pain increases with laying on her sides, pain with sit to stand, prolonged standing and walking. Pt has not found a way to relieve pain  Vestibular eval: pt states dizziness has been going on for "quite a while". Pt states she feels "room is spinning" when she goes from sit to stand. She reports she also feels that sensation when she goes from supine to sit but it is not as severe. She also states she veers to the right while walking. She also states that if she is standing still she will occasionally feel like she is "tipping over" to the right. The sensation only lasts a few seconds. Pt states she has had a cardiac work up which ruled out cardiac cause of dizziness PAIN:  Are you having pain? Yes: NPRS scale: 7/10 currently, 8-9/10 at worst Pain location: bilat hips Lt > Rt Pain description: sore, tender Aggravating factors: sitting in recliner, laying on sides, sit to stand Relieving factors: unknown  PRECAUTIONS: pt has spinal cord stimulator  RED FLAGS: None   WEIGHT BEARING RESTRICTIONS: No  FALLS:  Has patient fallen in last 6 months? No  OCCUPATION: retired  PLOF: Independent  PATIENT GOALS: reduce pain  NEXT MD VISIT: February 2025  OBJECTIVE:  Note: Objective measures were completed at Evaluation unless otherwise noted.  DIAGNOSTIC FINDINGS: x ray performed but not read at time as eval  PATIENT SURVEYS:  FOTO 39   MUSCLE LENGTH: Hamstrings: decreased bilat Hip flexors: decreased bilat   PALPATION: TTP bilat hip flexors, ITB, glutes, HS Increased mm spasticity bilat hamstrings, glutes, quads  LOWER  EXTREMITY ROM:  Active ROM Right eval Left eval  Hip flexion    Hip extension    Hip abduction    Hip adduction    Hip internal rotation    Hip external rotation 30 50  Knee flexion    Knee extension    Ankle dorsiflexion    Ankle plantarflexion    Ankle inversion    Ankle eversion     (Blank rows = not tested)  LOWER EXTREMITY MMT:  MMT Right eval Left eval  Hip flexion 3+ 3  Hip extension 3 3  Hip abduction 3 3  Hip adduction    Hip internal rotation    Hip external rotation 3 3+  Knee flexion    Knee extension    Ankle dorsiflexion    Ankle plantarflexion    Ankle inversion    Ankle eversion     (Blank rows = not tested)   FUNCTIONAL TESTS:  5 times sit to stand: 18.85 seconds  VESTIBULAR ASSESSMENT:     SYMPTOM BEHAVIOR:  Subjective history: pt states dizziness has been going on for "quite a while". Pt states she feels "room is spinning" when she goes from sit to stand. She reports she also feels that sensation when she goes from supine to sit but it is not as severe. She also states she veers to the right while walking. She also states that if she is standing still she will occasionally feel like she is "tipping over" to the right. The sensation only lasts a few seconds.  Non-Vestibular symptoms:  none reported  Type of dizziness: Spinning/Vertigo  Frequency: multiple times a day  Duration: seconds  Aggravating factors: Induced by position change: supine to sit, sit to stand, and walking  Relieving factors: slow movements  Progression of symptoms: unchanged  OCULOMOTOR EXAM:  Ocular Alignment: normal  Ocular ROM: No Limitations  Spontaneous Nystagmus: absent  Gaze-Induced Nystagmus: age appropriate nystagmus at end range  Smooth Pursuits: saccades and pt reports "spinning" sensation when focusing on pen  Saccades: intact     VESTIBULAR - OCULAR REFLEX:   Slow VOR: Positive Bilaterally increased spinning sensation  VOR Cancellation: Comment:  increased spinning sensation  Head-Impulse Test: HIT Right: positive HIT Left: negative     POSITIONAL TESTING: Right Dix-Hallpike: no nystagmus Left Dix-Hallpike: no nystagmus Right Roll Test: no nystagmus Left Roll Test: no nystagmus   MOTION SENSITIVITY:    Motion Sensitivity Quotient Intensity: 0 = none, 1 = Lightheaded, 2 = Mild, 3 = Moderate, 4 = Severe, 5 = Vomiting  Intensity  1. Sitting to supine   2. Supine to L side   3. Supine to R side   4. Supine to sitting   5. L Hallpike-Dix   6. Up from L    7. R Hallpike-Dix   8. Up from R    9. Sitting, head  tipped to L knee   10. Head up from L  knee   11. Sitting, head  tipped to R knee   12. Head up from R  knee   13. Sitting head turns x5   14.Sitting head nods x5   15. In stance, 180  turn to L    16. In stance, 180  turn to R      [facility] Adult PT Treatment:                                                DATE: 03/11/24 Therapeutic Exercise/Activity/NMR: 5xSTS : 23.11 seconds DGI: 21/24 Seated hip flexion with red TB resistance 2 x 10 Hip flexion abd/add over dumbell to simulate in /out of car Isometrics hip ER/IR and hip add/abd in hip flexed position Seated hip flexor stretch Reclined position butterfly stretch for Rt hip adductors as tolerated Manual Therapy: STM and TPR as tolerated Rt hip adductors and hip flexors   [facility] Adult PT Treatment:                                                DATE: 03/04/24 Therapeutic Exercise/Activity: Seated hip flexion with red TB resistance 2 x 10 Seated Hip flexion with abd/add x 10 - pain Seated hip add ball squeeze 10 x 3 sec hold Seated hip abd isometric 10 x 3 sec hold TKE ball on wall x 12 bilat  Neuromuscular re-ed: Habituation reclined to sitting upright   [facility] Adult PT Treatment:                                                DATE: 02/20/24 Therapeutic Exercise/Activity: Standing hip flexion submax isometric ball on wall x 12 TKE ball on wall x 12  bilat Seated hip add ball squeeze 10 x 3 sec hold Seated hip abd isometric 10 x 3 sec hold Standing hip ext isometric x 10 bilat Seated ab isometric with physioball Seated oblique isometric with physioball  Neuromuscular re-ed: Habituation reclined to sitting upright  [facility] Adult PT Treatment:                                                DATE: 02/15/24 Therapeutic Exercise/Activity/NMR: Standing hip flexion submax isometric ball on wall x 12 TKE ball on wall x 12 bilat Seated hip add ball squeeze 10 x 3 sec hold Seated hip abd isometric 10 x 3 sec hold Standing hip ext isometric x 10 bilat Seated ab isometric with physioball Seated oblique isometric with physioball                                                                                                                        PATIENT EDUCATION:  Education details: PT POC and goals, HEP Person educated: Patient Education method: Explanation, Demonstration, and Handouts Education comprehension: verbalized understanding and returned demonstration  HOME EXERCISE PROGRAM: Access Code: [email or web address] URL: [email or web address] Date: 03/11/2024 Prepared by: Lowery Rue  Exercises - Sit to Stand  - 1 x daily - 7 x weekly - 2 sets - 10 reps - Seated Hamstring Stretch  - 1 x daily - 7 x weekly - 1 sets - 3 reps - 20-30 seconds hold - Seated Figure 4 Piriformis Stretch  - 1 x daily - 7 x weekly - 1 sets - 3 reps - 20-30 seconds hold - Standing Hip Abduction with Bent Knee  - 1 x daily - 7 x weekly - 3 sets - 10 reps - Standing Hip Extension with Leg Bent and Support  - 1 x daily - 7 x weekly - 3 sets - 10 reps - Single Leg 3 Way Reach with Support  - 1 x daily - 7 x weekly - 3 sets - 10 reps - Seated Hip Internal Rotation with Ball and Resistance  - 1 x daily - 7 x weekly - 3 sets - 10 reps - Seated Hip Flexor Stretch  - 1 x daily - 7 x weekly - 1 sets - 3 reps - 30 seconds hold - Resistance Pulldown with March  - 1  x daily - 7 x weekly - 3 sets - 10 reps - Standing Bilateral Low Shoulder Row with Anchored Resistance  - 1 x daily - 7 x weekly - 3 sets - 10 reps - Standing Anti-Rotation Press with Anchored Resistance  - 1 x daily - 7 x weekly - 3 sets - 10 reps - Seated Gaze Stabilization with Head Nod  - 3 x daily - 7 x weekly - 3 sets - 10 reps - Seated Gaze Stabilization with Head Rotation  - 3 x daily - 7 x weekly - 3 sets - 10 reps - Seated VOR Cancellation  - 1 x daily - 7 x weekly - 3 sets - 10 reps - Supine Quad Set  - 1 x  daily - 7 x weekly - 2 sets - 10 reps - Standing Isometric Hip Flexion with Ball at [employer]  - 1 x daily - 7 x weekly - 2 sets - 10 reps - Single Leg Stance with Support  - 1 x daily - 7 x weekly - 1 sets - 3 reps - 20-30 seconds hold - Supine Hip Adductor Stretch  - 1 x daily - 7 x weekly - 1 sets - 3 reps - 30 seconds hold  ASSESSMENT:  CLINICAL IMPRESSION: Session focused on addressing muscle spasticity in hip flexors and adductors. Pt is very TTP and tolerates minimal manual work in this area. Added stretching to reduce muscle spasticity and improve ROM. Pt has improved DGI and does report improvements in vestibular symptoms. Continues to be limited by Rt hip pain  OBJECTIVE IMPAIRMENTS: decreased activity tolerance, decreased mobility, difficulty walking, decreased ROM, decreased strength, increased muscle spasms, impaired flexibility, and pain.     GOALS: Goals reviewed with patient? Yes  SHORT TERM GOALS: Target date: 01/09/2024   Pt will be independent with initial HEP Baseline: Goal status: MET  2.  Pt will improve FOTO to > = 45 to demo improving functional mobility Baseline: 33 on 01/11/24 Goal status: IN PROGRESS   LONG TERM GOALS: Target date: 03/19/2024      Pt will be independent with advanced HEP and plan for continued exercise in community Baseline:  Goal status: INITIAL  2.  Pt will improve FOTO to >= 50 to demo improved functional  mobility Baseline:  Goal status: INITIAL  3.  Pt will improve hip strength to 4+/5 bilat to improve standing and walking tolerance Baseline:  Goal status: INITIAL  4.  Pt will tolerate laying on both sides with pain <= 2/10 Baseline:  Goal status: MET  5.  Pt will improve 5 x STS to <= 15 seconds Baseline:  Goal status: IN PROGRESS - 23.11 seconds on 03/11/24  6.  Pt will report transitioning sit to stand without dizziness symptoms 75% of the time Baseline:  Goal status: INITIAL 7.  Pt will demo decreased risk of falls with DGI >= 21/24 Baseline: 18/24 Goal status: MET - 21/24 on 03/11/24       PLAN:  PT FREQUENCY: 2x/week  PT DURATION: 8 weeks  PLANNED INTERVENTIONS: 97164- PT Re-evaluation, 97110-Therapeutic exercises, 97530- Therapeutic activity, 97112- Neuromuscular re-education, 97535- Self Care, 16109- Manual therapy, [id number]- Aquatic Therapy, 97035- Ultrasound, [id number]- Ionotophoresis 4mg /ml Dexamethasone, Patient/Family education, Balance training, Taping, Dry Needling, Cryotherapy, and Moist heat  PLAN FOR NEXT SESSION: vestibular, balance, hip stability, hip strengthening   Brayln Duque, PT 03/11/2024, 1:16 PM

## 2024-03-11 NOTE — Telephone Encounter (Signed)
Error no encounter needed.

## 2024-03-12 NOTE — Telephone Encounter (Signed)
 Patient states she did not request this refill. She states she is not at home to check but feels that she still has medication remaining at home and does not feel that she needs the refill at this time.

## 2024-03-12 NOTE — Telephone Encounter (Signed)
 Ok. I think it was [employer] automatic request.

## 2024-03-12 NOTE — Telephone Encounter (Signed)
 This is chronic for her but when I checked, she just picked it up on 3/26. Please verify that I'm not seeing it wrong.

## 2024-03-13 ENCOUNTER — Encounter: Payer: Self-pay | Admitting: Physical Therapy

## 2024-03-13 ENCOUNTER — Ambulatory Visit: Admitting: Physical Therapy

## 2024-03-13 ENCOUNTER — Encounter: Payer: Self-pay | Admitting: Professional

## 2024-03-13 ENCOUNTER — Ambulatory Visit (INDEPENDENT_AMBULATORY_CARE_PROVIDER_SITE_OTHER): Admitting: Professional

## 2024-03-13 DIAGNOSIS — M25551 Pain in right hip: Secondary | ICD-10-CM | POA: Diagnosis not present

## 2024-03-13 DIAGNOSIS — F332 Major depressive disorder, recurrent severe without psychotic features: Secondary | ICD-10-CM

## 2024-03-13 DIAGNOSIS — F411 Generalized anxiety disorder: Secondary | ICD-10-CM

## 2024-03-13 DIAGNOSIS — F5102 Adjustment insomnia: Secondary | ICD-10-CM

## 2024-03-13 DIAGNOSIS — F4321 Adjustment disorder with depressed mood: Secondary | ICD-10-CM

## 2024-03-13 DIAGNOSIS — M6281 Muscle weakness (generalized): Secondary | ICD-10-CM

## 2024-03-13 DIAGNOSIS — R42 Dizziness and giddiness: Secondary | ICD-10-CM

## 2024-03-13 NOTE — Therapy (Signed)
 OUTPATIENT PHYSICAL THERAPY LOWER EXTREMITY and VESTIBULAR   Patient Name: Heather Thompson MRN: [id number] DOB:1950-12-16, 73 y.o., female Today's Date: 03/13/2024  END OF SESSION:  PT End of Session - 03/13/24 1141     Visit Number 19    Number of Visits 26    Date for PT Re-Evaluation 03/19/24    Authorization Type Medicare    Authorization - Visit Number 19    Progress Note Due on Visit 20    PT Start Time 1100    PT Stop Time 1145    PT Time Calculation (min) 45 min    Activity Tolerance Patient tolerated treatment well    Behavior During Therapy WFL for tasks assessed/performed                               Past Medical History:  Diagnosis Date   Abnormality of gait 04/23/2019   Acquired hypothyroidism 07/29/2021   Allergic rhinitis due to animal hair and dander 12/02/2015   Formatting of this note might be different from the original.  [facility]  Seen by Dr. Travis Friedman 11/11/15     Plan- Received allergy shots today. Follow up 1 year.     Allergy    Anemia    Anemia    Anxiety    Arthritis of scaphoid-trapezium-trapezoid joint of right hand 09/10/2023   B12 deficiency    Back pain    Chronic fatigue 07/29/2021   Chronic fatigue syndrome    Chronic throat clearing 03/14/2023   Clotting disorder (HCC)    Constipation    Depression with anxiety 04/07/2009   Formatting of this note might be different from the original.  well controlled on current meds, follows with psychiatrist in [place]. Had a   h/o severe depression 5 years ago, and had ECT.     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  B12 deficiency   Iron deficiency      Herniated lumbar intervertebral disc 11/12/2020   High cholesterol    History of blood clots    History of cervical spinal surgery 01/01/2018   Formatting of this note might be different from the original.  Fusion C6 and C7 per 2018 MRI report.     History of left hip replacement 01/14/2018   History of Roux-en-Y gastric bypass    Hoarseness 03/14/2023   Hyperlipidemia 08/19/2010   Hypertension    Hyponatremia 01/17/2018   Hypothyroidism    Hypothyroidism    IBS (irritable bowel syndrome) 04/06/2021   Impaired fasting glucose 08/19/2010   Incontinence 10/30/2013   Formatting of this note might be different from the original. Formatting of this note might be different from the original. Urinary Incontinence     Infertility, female    Insomnia 04/07/2009   Formatting of this note might be different from the original.  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  Followed by [facility]  Seen by Dr. Nyle Belling 03/01/15     S/p changing thoracic spinal cord stimulator generator 08/18/14.     Lumbar spine films show spinal cord generator in left flank area. 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  Follows with Rheumatology in [facility] of this note might be different from the original.  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  Getting iron infusion via port at hematalogy/oncology center at [facility].   Was recently hospitalized in [facility] for severe anemia     Formatting of this note might be different from the original.  on OTC b12 currently     Vitamin D deficiency    Past Surgical History:  Procedure Laterality Date   ABDOMINAL HYSTERECTOMY     APPENDECTOMY     CARPAL TUNNEL RELEASE Bilateral 2023   with thumb  joint replacement   CERVICAL FUSION     CHOLECYSTECTOMY     FOOT SURGERY Bilateral    fusion   GASTRIC BYPASS     SPINAL CORD STIMULATOR INSERTION  07/2023   TOTAL HIP ARTHROPLASTY Bilateral    11/2016 & 05/2017   Patient Active Problem List   Diagnosis Date Noted   Palpitations 10/04/2023   Cardiac murmur 10/04/2023   Allergy    Anemia    Anxiety    B12 deficiency    Back pain    Chronic fatigue syndrome    Clotting disorder (HCC)    Constipation    Edema  of both lower extremities    Fatty liver    Fibromyalgia    Gallbladder problem    High cholesterol    History of blood clots    History of Roux-en-Y gastric bypass    Hypothyroidism    Infertility, female    Joint pain    Kidney stones    Muscle disorder    Osteoarthritis    Prediabetes    Salzmann nodular degeneration    SOBOE (shortness of breath on exertion)    Swallowing difficulty    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   Arthritis of scaphoid-trapezium-trapezoid joint of right hand 09/10/2023   Urge incontinence 08/28/2023   OAB (overactive bladder) 08/28/2023   Lumbar post-laminectomy syndrome 07/06/2023   Snapping hip syndrome, 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   Other myositis, left thigh 04/24/2023   Sacroiliitis, not elsewhere classified (HCC) 04/24/2023   Dysphagia 04/17/2023   Elevated liver enzymes 04/02/2023   Elevated TSH 04/02/2023   Chronic throat clearing 03/14/2023   Globus sensation 03/14/2023   Hoarseness 03/14/2023   Muscle tension dysphonia 03/12/2023   Unspecified inflammatory spondylopathy, 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   Primary osteoarthritis of both knees 12/21/2021   Port-A-Cath in place 07/29/2021   Acquired hypothyroidism 07/29/2021   Chronic pain syndrome 07/29/2021   Vitamin B12 deficiency 07/29/2021   Vitamin D deficiency 07/29/2021   Postmenopausal estrogen deficiency 07/29/2021   IBS (irritable bowel syndrome) 04/06/2021   Retention of urine 04/06/2021   Herniated  lumbar intervertebral disc 11/12/2020   Skin sensation disturbance 11/12/2020   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   Spondylopathy, unspecified 11/12/2020   Trochanteric bursitis of both hips 11/19/2019   Low serum iron 04/23/2019   Osteomalacia 04/23/2019   Abnormality of gait 04/23/2019   History of bilateral hip arthroplasty 06/26/2018   Gastroesophageal reflux disease 06/18/2018   Hypertension 06/18/2018   Intertrigo 04/30/2018   Lentigines 04/30/2018   Hyponatremia 01/17/2018   History of cervical spinal surgery 01/01/2018   Obesity (BMI 30-39.9) 01/01/2018   SK (seborrheic keratosis) 04/18/2017   Pain management contract agreement 11/17/2016   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   Allergic rhinitis due to animal hair and dander 12/02/2015   Low back pain 03/03/2015   Incontinence 10/30/2013   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   Neutrophilic leukocytosis 08/12/2012   Thrombocytosis 08/12/2012   Osteoporosis, senile 11/15/2010   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   Hyperlipidemia 08/19/2010   Impaired fasting glucose 08/19/2010   Polyarthritis 09/28/2009   Degenerative disc disease 04/07/2009   Depression with anxiety 04/07/2009   Myopathy 04/07/2009   Insomnia 04/07/2009   Migraine 04/07/2009   Scoliosis 04/07/2009   Seasonal allergies 04/07/2009   Vitamin B12 deficiency anemia 04/07/2009    PCP: Cherre Cornish  REFERRING PROVIDER: Carlye Child DIAG: bilateral trochanteric bursitis, vertigo  THERAPY DIAG:  Bilateral hip pain  Muscle weakness (generalized)  Dizziness and giddiness  Rationale for Evaluation and Treatment: Rehabilitation  ONSET DATE: 04/2023  SUBJECTIVE:   SUBJECTIVE STATEMENT: Pt reports she went to urgent care on Tuesday due to pain in Rt scapula. They prescribed  pain meds but she did not pick them up. She states pain is unchanged in Rt scapula/back. Pt states she has MRI scheduled for back and neck.    PERTINENT HISTORY: Bilat THA  2018, fibromyalgia, OA, spinal cord stimulator, bilat TC joint fusion  Pt had bilateral hip replacements in 2018. Pt began having Lt hip and groin pain last summer that was so severe she went to ED. She was  eventually diagnosed with bursitis. Since that time the pain has continued and is "always there". She now has bilateral hip pain, groin pain. Hips are tender to touch. Pain increases with laying on her sides, pain with sit to stand, prolonged standing and walking. Pt has not found a way to relieve pain  Vestibular eval: pt states dizziness has been going on for "quite a while". Pt states she feels "room is spinning" when she goes from sit to stand. She reports she also feels that sensation when she goes from supine to sit but it is not as severe. She also states she veers to the right while walking. She also states that if she is standing still she will occasionally feel like she is "tipping over" to the right. The sensation only lasts a few seconds. Pt states she has had a cardiac work up which ruled out cardiac cause of dizziness PAIN:  Are you having pain? Yes: NPRS scale: 7/10 currently, 8-9/10 at worst Pain location: bilat hips Lt > Rt Pain description: sore, tender Aggravating factors: sitting in recliner, laying on sides, sit to stand Relieving factors: unknown  PRECAUTIONS: pt has spinal cord stimulator  RED FLAGS: None   WEIGHT BEARING RESTRICTIONS: No  FALLS:  Has patient fallen in last 6 months? No  OCCUPATION: retired  PLOF: Independent  PATIENT GOALS: reduce pain  NEXT MD VISIT: February 2025  OBJECTIVE:  Note: Objective measures were completed at Evaluation unless otherwise noted.  DIAGNOSTIC FINDINGS: x ray performed but not read at time as eval  PATIENT SURVEYS:  FOTO 39   MUSCLE LENGTH: Hamstrings: decreased bilat Hip flexors: decreased bilat   PALPATION: TTP bilat hip flexors, ITB, glutes, HS Increased mm spasticity bilat hamstrings, glutes,  quads  LOWER EXTREMITY ROM:  Active ROM Right eval Left eval  Hip flexion    Hip extension    Hip abduction    Hip adduction    Hip internal rotation    Hip external rotation 30 50  Knee flexion    Knee extension    Ankle dorsiflexion    Ankle plantarflexion    Ankle inversion    Ankle eversion     (Blank rows = not tested)  LOWER EXTREMITY MMT:  MMT Right eval Left eval  Hip flexion 3+ 3  Hip extension 3 3  Hip abduction 3 3  Hip adduction    Hip internal rotation    Hip external rotation 3 3+  Knee flexion    Knee extension    Ankle dorsiflexion    Ankle plantarflexion    Ankle inversion    Ankle eversion     (Blank rows = not tested)   FUNCTIONAL TESTS:  5 times sit to stand: 18.85 seconds  VESTIBULAR ASSESSMENT:     SYMPTOM BEHAVIOR:  Subjective history: pt states dizziness has been going on for "quite a while". Pt states she feels "room is spinning" when she goes from sit to stand. She reports she also feels that sensation when she goes from supine to sit but it is not as severe. She also states she veers to the right while walking. She also states that if she is standing still she will occasionally feel like she is "tipping over" to the right. The sensation only lasts a few seconds.  Non-Vestibular symptoms:  none reported  Type of dizziness: Spinning/Vertigo  Frequency: multiple times a day  Duration: seconds  Aggravating factors: Induced by position change: supine to sit, sit to stand, and  walking  Relieving factors: slow movements  Progression of symptoms: unchanged  OCULOMOTOR EXAM:  Ocular Alignment: normal  Ocular ROM: No Limitations  Spontaneous Nystagmus: absent  Gaze-Induced Nystagmus: age appropriate nystagmus at end range  Smooth Pursuits: saccades and pt reports "spinning" sensation when focusing on pen  Saccades: intact     VESTIBULAR - OCULAR REFLEX:   Slow VOR: Positive Bilaterally increased spinning sensation  VOR  Cancellation: Comment: increased spinning sensation  Head-Impulse Test: HIT Right: positive HIT Left: negative     POSITIONAL TESTING: Right Dix-Hallpike: no nystagmus Left Dix-Hallpike: no nystagmus Right Roll Test: no nystagmus Left Roll Test: no nystagmus   MOTION SENSITIVITY:    Motion Sensitivity Quotient Intensity: 0 = none, 1 = Lightheaded, 2 = Mild, 3 = Moderate, 4 = Severe, 5 = Vomiting  Intensity  1. Sitting to supine   2. Supine to L side   3. Supine to R side   4. Supine to sitting   5. L Hallpike-Dix   6. Up from L    7. R Hallpike-Dix   8. Up from R    9. Sitting, head  tipped to L knee   10. Head up from L  knee   11. Sitting, head  tipped to R knee   12. Head up from R  knee   13. Sitting head turns x5   14.Sitting head nods x5   15. In stance, 180  turn to L    16. In stance, 180  turn to R      [facility] Adult PT Treatment:                                                DATE: 03/13/24 Therapeutic Exercise/Activity/NMR: Hip ER/IR isometrics in modified butterfly position (Rt) Hip add isometric green bolster 10 x 5 sec hold Hip abd isometric 10 x 5 sec hold Seated hip flexion with red TB resistance 2 x 10 Manual Therapy: STM and TPR Rt hip flexors, adductors   [facility] Adult PT Treatment:                                                DATE: 03/11/24 Therapeutic Exercise/Activity/NMR: 5xSTS : 23.11 seconds DGI: 21/24 Seated hip flexion with red TB resistance 2 x 10 Hip flexion abd/add over dumbell to simulate in /out of car Isometrics hip ER/IR and hip add/abd in hip flexed position Seated hip flexor stretch Reclined position butterfly stretch for Rt hip adductors as tolerated Manual Therapy: STM and TPR as tolerated Rt hip adductors and hip flexors   [facility] Adult PT Treatment:                                                DATE: 03/04/24 Therapeutic Exercise/Activity: Seated hip flexion with red TB resistance 2 x 10 Seated Hip flexion with abd/add x  10 - pain Seated hip add ball squeeze 10 x 3 sec hold Seated hip abd isometric 10 x 3 sec hold TKE ball on wall x 12 bilat  Neuromuscular re-ed: Habituation reclined to sitting upright  [facility] Adult PT Treatment:                                                DATE: 02/20/24 Therapeutic Exercise/Activity: Standing hip flexion submax isometric ball on wall x 12 TKE ball on wall x 12 bilat Seated hip add ball squeeze 10 x 3 sec hold Seated hip abd isometric 10 x 3 sec hold Standing hip ext isometric x 10 bilat Seated ab isometric with physioball Seated oblique isometric with physioball  Neuromuscular re-ed: Habituation reclined to sitting upright                                                                                                                      PATIENT EDUCATION:  Education details: PT POC and goals, HEP Person educated: Patient Education method: Programmer, multimedia, Demonstration, and Handouts Education comprehension: verbalized understanding and returned demonstration  HOME EXERCISE PROGRAM: Access Code: [email or web address] URL: [email or web address] Date: 03/11/2024 Prepared by: Lowery Rue  Exercises - Sit to Stand  - 1 x daily - 7 x weekly - 2 sets - 10 reps - Seated Hamstring Stretch  - 1 x daily - 7 x weekly - 1 sets - 3 reps - 20-30 seconds hold - Seated Figure 4 Piriformis Stretch  - 1 x daily - 7 x weekly - 1 sets - 3 reps - 20-30 seconds hold - Standing Hip Abduction with Bent Knee  - 1 x daily - 7 x weekly - 3 sets - 10 reps - Standing Hip Extension with Leg Bent and Support  - 1 x daily - 7 x weekly - 3 sets - 10 reps - Single Leg 3 Way Reach with Support  - 1 x daily - 7 x weekly - 3 sets - 10 reps - Seated Hip Internal Rotation with Ball and Resistance  - 1 x daily - 7 x weekly - 3 sets - 10 reps - Seated Hip Flexor Stretch  - 1 x daily - 7 x weekly - 1 sets - 3 reps - 30 seconds hold - Resistance Pulldown with March  - 1 x daily - 7 x weekly - 3  sets - 10 reps - Standing Bilateral Low Shoulder Row with Anchored Resistance  - 1 x daily - 7 x weekly - 3 sets - 10 reps - Standing Anti-Rotation Press with Anchored Resistance  - 1 x daily - 7 x weekly - 3 sets - 10 reps - Seated Gaze Stabilization with Head Nod  - 3 x daily - 7 x weekly - 3 sets - 10 reps - Seated Gaze Stabilization with Head Rotation  - 3 x daily - 7 x weekly - 3 sets - 10 reps - Seated VOR Cancellation  - 1 x daily - 7 x weekly - 3 sets - 10 reps - Supine Quad Set  - 1 x daily -  7 x weekly - 2 sets - 10 reps - Standing Isometric Hip Flexion with Ball at [employer]  - 1 x daily - 7 x weekly - 2 sets - 10 reps - Single Leg Stance with Support  - 1 x daily - 7 x weekly - 1 sets - 3 reps - 20-30 seconds hold - Supine Hip Adductor Stretch  - 1 x daily - 7 x weekly - 1 sets - 3 reps - 30 seconds hold  ASSESSMENT:  CLINICAL IMPRESSION: Focused on manual work to decrease muscle spasticity and isometrics to improve tolerance to exercise and decrease pain. Pt with good tolerance to all activities throughout session  OBJECTIVE IMPAIRMENTS: decreased activity tolerance, decreased mobility, difficulty walking, decreased ROM, decreased strength, increased muscle spasms, impaired flexibility, and pain.     GOALS: Goals reviewed with patient? Yes  SHORT TERM GOALS: Target date: 01/09/2024   Pt will be independent with initial HEP Baseline: Goal status: MET  2.  Pt will improve FOTO to > = 45 to demo improving functional mobility Baseline: 33 on 01/11/24 Goal status: IN PROGRESS   LONG TERM GOALS: Target date: 03/19/2024      Pt will be independent with advanced HEP and plan for continued exercise in community Baseline:  Goal status: INITIAL  2.  Pt will improve FOTO to >= 50 to demo improved functional mobility Baseline:  Goal status: INITIAL  3.  Pt will improve hip strength to 4+/5 bilat to improve standing and walking tolerance Baseline:  Goal status: INITIAL  4.   Pt will tolerate laying on both sides with pain <= 2/10 Baseline:  Goal status: MET  5.  Pt will improve 5 x STS to <= 15 seconds Baseline:  Goal status: IN PROGRESS - 23.11 seconds on 03/11/24  6.  Pt will report transitioning sit to stand without dizziness symptoms 75% of the time Baseline:  Goal status: INITIAL 7.  Pt will demo decreased risk of falls with DGI >= 21/24 Baseline: 18/24 Goal status: MET - 21/24 on 03/11/24       PLAN:  PT FREQUENCY: 2x/week  PT DURATION: 8 weeks  PLANNED INTERVENTIONS: 97164- PT Re-evaluation, 97110-Therapeutic exercises, 97530- Therapeutic activity, 97112- Neuromuscular re-education, 97535- Self Care, [id number]- Manual therapy, [id number]- Aquatic Therapy, 97035- Ultrasound, [id number]- Ionotophoresis 4mg /ml Dexamethasone, Patient/Family education, Balance training, Taping, Dry Needling, Cryotherapy, and Moist heat  PLAN FOR NEXT SESSION: CHECK GOALS , finalize HEP vestibular, balance, hip stability, hip strengthening   Ziyana Morikawa, PT 03/13/2024, 11:43 AM

## 2024-03-13 NOTE — Progress Notes (Signed)
 [facility] Counselor/Therapist Progress Note  Patient ID: Heather Thompson, MRN: [id number],    Date: 03/13/2024  Time Spent: 59 minutes [id number]  Treatment Type: Individual Therapy  Risk Assessment: Danger to Self:  No Self-injurious Behavior: No Danger to Others: No  Subjective: The patient arrived on time for her inperson appointment  Issues addressed: 1-sleep hygiene -educated 2-Treatment planning -"I need help working on on my depression which I don't think is going anywhere". -"my grief- How to not feel lonely all the time, how not to feel miserable all the time" -"My weight loss and eating issue-How to handle my over eating so that I'm not eating constantly. When my husband passed, never never never in my life did I take food to bed." She now grabs a snack to take to bed and admits it is the worst time to eat and  I don't know if there's any kind of help to stop doing that. -"My loneliness-How to be able to handle being alone without having a pity party because I'm alone"; pt would like to do crafts but doesn't see it as fun she does so by herself -"my health issues that I worry about-Spine and legs are weak and really hurt "to stop worrying about the future" I have no control over it and it goes back to my feeling about my family and I am not going to get any help from them and end in a nursing home -"To stop being so angry at my kids because they threw me away and to stop worrying about the future because I beat what the doctors told me I wouldn't be here in 5 years  Treatment Plan Problems: Financial Stress, Low Self-Esteem, Attention Deficit Disorder (ADD) - Adult Symptoms: Unable to concentrate or pay attention to things of low interest, even when those things are important to his/her life. Restless and fidgety; unable to be sedentary for more than a short time. Disorganized in most areas of his/her life. Starts many projects but rarely finishes any. Chronic  low self-esteem. Tendency toward addictive behaviors. Indebtedness and overdue bills that exceed ability to meet monthly payments. A long-term lack of discipline in money management that has led to excessive indebtedness. A pattern of impulsive spending that does not consider the eventual financial consequences. Inability to accept compliments. Makes self-disparaging remarks; sees self as unattractive, worthless, a loser, a burden, unimportant; takes blame easily. Difficulty in saying no to others; assumes not being liked by others. Fear of rejection by others, especially peer group. Lack of any goals for life and setting of inappropriately low goals for self. Inability to identify positive characteristics of self. Anxious and uncomfortable in social situations. Goals: Reduce impulsive actions while increasing concentration and focus on low-interest activities. Minimize ADD behavioral interference in daily life. Sustain attention and concentration for consistently longer periods of time. Achieve an inner strength to control personal impulses, cravings, and desires that directly or indirectly increase debt irresponsibly. Resolve financial crisis with a path to eliminate debt. Revise spending patterns to not exceed income. Gain a new sense of self-worth in which the substance of one's value is not attached to the capacity to do things or own things that cost money. Understand personal desires, insecurities, and anxieties that make overspending possible. Elevate self-esteem. Develop a consistent, positive self-image. Demonstrate improved self-esteem through more pride in appearance, more assertiveness, greater eye contact, and identification of positive traits in self-talk messages. Establish an inward sense of self-worth, confidence, and competence. Interact socially without  undue distress or disability. Objectives target date for all objectives is 03/25/2025: Identify the current specific  ADD behaviors that cause the most difficulty. List the negative consequences of the ADD problematic behavior. Learn and implement organization and planning skills. Acknowledge procrastination and the need to reduce it. Learn and implement skills to reduce procrastination. Learn and implement skills to reduce the disruptive influence of distractibility. Combine skills learned in therapy into a new daily approach to managing ADHD. Use cognitive and behavioral strategies to control the impulse to make unnecessary and unaffordable purchases. Report instances of successful control over impulse to spend on unnecessary expenses. Increase insight into the historical and current sources of low self-esteem. Decrease the frequency of negative self-descriptive statements and increase frequency of positive self-descriptive statements. Articulate a plan to be proactive in trying to get identified needs met. Form realistic, appropriate, and attainable goals for self in all areas of life. Take verbal responsibility for accomplishments without discounting. Identify and replace negative self-talk messages used to reinforce low self-esteem. Interventions: Assist the client in identifying the current specific behaviors that cause him/her the most difficulty functioning as part of identifying treatment targets (i.e., a functional analysis). Select situations in which the client will be increasingly challenged to apply his/her new strategies for managing ADHD, starting with situations highly likely to be successful. Teach the client meditational and self-control strategies (e.g., "stop, look, listen, and think") to delay the need for instant gratification and inhibit impulses to achieve more meaningful, longer-term goals. Teach the client to apply new problem-solving skills to planning as a first step in overcoming procrastination; for each plan, break it down into manageable time-limited steps to reduce the influence  of distractibility. Assist the client in identifying positives and negatives of procrastinating toward the goal of engaging him/her in staying focused. Teach the client to use timers or other cues to remind him/her to stop tasks before he/she gets distracted in an effort to reduce the time they may be distracted and off-task (see Mastering Your Adult ADHD: Therapist Guide by Loura Rower al.). Teach the client to break down tasks into meaningful smaller units that can be completed without being distracted based on their demonstrated attention span. Teach the client stimulus control techniques that use external structure (e.g., lists, reminders, files, daily rituals) to improve on-task behavior; remove distracting stimuli in the environment; encourage the client to reward himself/herself for successful focus and follow-through. Assess the client's typical attention span by having them do a few "boring" tasks (e.g., sorting bills, reading something uninteresting) to the point that they report distraction; use this as an approximate measure of their typical attention span. Teach the client organization and planning skills including the routine use of a calendar and daily task list. Teach the client problem-solving skills (i.e., identify problem, brainstorm all possible options, evaluate the pros and cons of each option, select best option, implement a course of action, and evaluate results) as an approach to planning; for each plan, break it down into manageable time-limited steps to reduce the influence of distractibility. Assign the client to make a list of negative consequences that he/she has experienced or that could result from a continuation of the problematic behavior; process the list (or assign "Impulsive Behavior Journal" in the Adult Psychotherapy Homework Planner by Beacher Bottoms). Help the client identify his/her distorted, negative beliefs about self and the world and replace these messages with more  realistic, affirmative messages (or assign "Journal and Replace Self-Defeating Thoughts" in the Adult Psychotherapy Homework Planner by  Jongsma or read What to Say When You Talk to Yourself by Helmstetter). Assist the client in becoming aware of how he/she expresses or acts out negative feelings about himself/herself. Help the client reframe his/her negative assessment of himself/herself. Help the client become aware of his/her fear of rejection and its connection with past rejection or abandonment experiences; begin to contrast past experiences of pain with present experiences of acceptance and competence. Ask the client to list accomplishments; process the integration of these into his/her self-image. Help the client analyze his/her goals to make sure they are realistic and attainable. Assign the client to make a list of goals for various areas of life and a plan for steps toward goal attainment. Assist the client in identifying and verbalizing his/her needs, met and unmet. Reinforce with praise and encouragement all of the client's reports of resisting the urge to overspend. Urge the client to avoid all impulse buying by delaying every purchase until after 24 hours of thought and by buying only from a prewritten list of items to buy (consider assigning "Impulsive Behavior Journal" from the Adult Psychotherapy Homework Planner by Beacher Bottoms). Teach the client the cognitive strategy of asking self before each purchase: Is this purchase absolutely necessary? Can we afford this? Do we have the cash to pay for this without incurring any further debt? Role-play situations in which the client must resist external pressure to spend beyond what he/she can afford (e.g., friend's invitation to golf or go shopping, child's request for a toy), emphasizing being graciously assertive in refusing the request. Role-play situations in which the client must resist the inner temptation to spend beyond reasonable limits,  emphasizing positive self-talk that compliments self for being disciplined. Objectives target date for all objectives is 03/13/2025: Identify the specific anxiety symptoms that are personally most disturbing or most contributing to impaired functioning. Learn and practice thought and behavioral control methods to minimize and control anxiety symptoms once they have begun. Identify specific thoughts that precipitate anxiety symptoms. Replace anxiety-producing thoughts with constructive thoughts. Verbalize an understanding of the general physical and cognitive manifestations of the causes for anxiety. Identify and clarify the patterns of anxiety precipitants and consequences. Identify whether the symptoms of depression seem to be primarily related to interpersonal relationships, stressful life events or circumstances, thoughts/beliefs, or behaviors. Verbalize an understanding of the general physical and psychological effects of depression. Replace depression-promoting thoughts with mood-elevating thoughts. Keep a daily record of mood rating from 1 to 10, noting associated behaviors, activities, events, people, and thoughts. Identify specific thoughts that precipitate depressive moods. Monitor and report depressive symptoms, completing self-report assessment on a periodic basis. Describe and evaluate significant past and current interpersonal relationships. Tell memories of the deceased person, starting with positive memories. Consider possible ways of increasing involvement with others. Accept the reality of loss and tolerate the pain of intense grief. Engage in events and activities that increase level of social involvements. Identify and monitor specific pain triggers. Learn and implement somatic skills such as relaxation and/or biofeedback to reduce pain level. Learn mental coping skills and implement with somatic skills for managing acute pain. Interventions: Reinforce the client's belief  system and/or refer to a spiritual leader who can support the client's faith. Explore and problem-solve with the client difficulties, fears, or barriers related to increasing social involvement. Point out to the client that engagement with others is associated with decreased feelings of loneliness, grief, depression, and hopelessness. Provide encouragement and support to the client for increased engagement in contact with  others. Clarify with the client that expression of negative thoughts or feelings about the deceased is not disloyal. Assist the client in remembering the loved one in realistic ways (i.e., includes attractions, positive traits, negative traits, conflicts, ambivalence, dependency, etc.), a memory that is neither idealized nor demonized, so that the client's relationship with the deceased person can be appraised realistically (or assign "Dear _____: A Letter to a Lost Loved One" in the Adult Psychotherapy Homework Planner, 2nd ed. by Beacher Bottoms). Assign the client to read about progressive muscle relaxation and other calming strategies in relevant books or treatment manuals (e.g., Progressive Relaxation Training by Juleen Oakland). Assign a homework exercise in which the client implements somatic pain management skills and records the result; review and process during the treatment session. Teach the client distraction techniques (e.g., pleasant imagery, counting techniques, alternative focal points) and how to use them for relaxation skills for the management of acute episodes of pain. Ask the client to create a list of activities that are pleasurable to him/her (or assign "Identify and Schedule Pleasant Activities" in the Adult Psychotherapy Homework Planner, 2nd ed. by Beacher Bottoms); process the list, developing a plan of increasing the frequency of engaging in the selected pleasurable activities. Teach the client problem-solving skills to apply to removal of obstacles to implementing  new skills; role-play implementation of these skills to obstacles. Assist the client in identifying the most effective personal stress management techniques (e.g., prayer, walking, baking, telephoning a friend), and encourage daily scheduling of these activities (or assign "Past Successful Anxiety Coping" in the Adult Psychotherapy Homework Planner, 2nd ed. by Beacher Bottoms). Teach the client the concept of finding a good match between the individual's capacities and the demands of the physical environment. If the physical environment is too demanding for the individual's capacities (e.g., frail older adult taking care of a large house), the individual can become overwhelmed and may need to move from large home to smaller accommodations or residence for older adults). While reviewing the client's anxiety symptom chart, help him/her recognize patterns associated with anxiety symptoms: sort out precipitants from consequences, identify the most intense or frequent precipitants, and identify the consequences that help to perpetuate maladaptive patterns. Assist the client in identifying his/her current attempts to reducing anxiety (e.g., constantly telephoning family or physician, making unneeded doctor appointments or going to the emergency room), their apparent positive consequences (e.g., feeling better, getting attention), but longer-term negative consequences (e.g., physician won't return calls, friends avoid the client because of expressions of worry). Teach the client that his/her mood can be improved by increasing pleasant events and decreasing unpleasant events. Encourage the client to identify pleasant events that are desirable, but are not currently a part of his/her daily routine (see "Identify and Schedule Pleasant Activities" in the Adult Psychotherapy Homework Planner, 2nd ed. by Beacher Bottoms). Develop a one-week daily schedule with the client that increases pleasant events and decreases unpleasant events,  making sure to have at least one pleasant event every day. Help the client to understand his/her strengths and weaknesses in problem-solving, to view problems as challenges and not personal deficits, and to identify and define current problem(s) that will be the focus of treatment. Help the client to identify realistic goals to address problem(s) of concern and identify major obstacles in achieving goals. Encourage the client to implement chosen solutions to current life problem(s), self-monitor and evaluate solution implementation, and reward self for efforts. Explore the relationship of negative self-talk (e.g., "I'm just a burden to everyone"; "Everyone would be  better off if I were dead") and distorted beliefs (e.g., "There's no one like me living here"; "I'd rather never leave my room than have anyone see me in a wheelchair") to depressed mood (or assign "Negative Thoughts Trigger Negative Feelings" in the Adult Psychotherapy Homework Planner, 2nd ed. by Beacher Bottoms; or "Daily Record of Dysfunctional Thoughts" in Cognitive Therapy of Depression by Bolling Bushy and Roselle Conner). Encourage the client to distinguish between all-or-nothing thinking (e.g., "Life in a nursing home can't be worth living") and genuine nuanced reflection on life's meaning and purpose (e.g., "What's giving my life meaning at this stage?"); confront the former, encourage the latter.   Diagnosis:Generalized anxiety disorder  Severe episode of recurrent major depressive disorder, without psychotic features (HCC)  Adjustment insomnia  Grief  Plan:  -meet again on Thursday, March 20, 2024 at 11am in person

## 2024-03-14 ENCOUNTER — Ambulatory Visit
Admission: RE | Admit: 2024-03-14 | Discharge: 2024-03-14 | Disposition: A | Discharge status: Home / Self Care | Source: Ambulatory Visit | Attending: Sports Medicine | Admitting: Sports Medicine

## 2024-03-14 DIAGNOSIS — M170 Bilateral primary osteoarthritis of knee: Secondary | ICD-10-CM

## 2024-03-14 HISTORY — PX: IR RADIOLOGIST EVAL & MGMT: IMG5224

## 2024-03-14 NOTE — Consult Note (Signed)
 Chief Complaint: Patient was seen in consultation today for chronic knee pain at the request of [name]  Referring Physician(s): Annemarie Kil J  History of Present Illness: Heather Thompson is a 73 y.o. female With a history of degenerative osteoarthritis of multiple joints.  She has had bilateral prior total shoulder arthroplasties as well removal of a synovial cyst from her right wrist.  Over the past 10 years she has had progressive chronic bilateral knee pain worse right than left.  Knee radiographs obtained in 2023 demonstrated moderately advanced osteoarthritis on the right and mild osteoarthritis on left.   She has discussed knee arthroplasty with her orthopedic surgeon.  Unfortunately, she now lives alone has very limited social support.  She and her surgeon are concerned that she would not have help needed to allow her to recover from a knee arthroplasty.  Steroid injections have been performed in the past but have offered little benefit.  She presents today to discuss minimally invasive therapy to help her with her knee pain and mobility.      Past Medical History:  Diagnosis Date   Abnormality of gait 04/23/2019   Acquired hypothyroidism 07/29/2021   Allergic rhinitis due to animal hair and dander 12/02/2015   Formatting of this note might be different from the original.  [facility]  Seen by Dr. Travis Friedman 11/11/15     Plan- Received allergy shots today. Follow up 1 year.     Allergy    Anemia    Anemia    Anxiety    Arthritis of scaphoid-trapezium-trapezoid joint of right hand 09/10/2023   B12 deficiency    Back pain    Chronic fatigue 07/29/2021   Chronic fatigue syndrome    Chronic throat clearing 03/14/2023   Clotting disorder (HCC)    Constipation    Depression with anxiety 04/07/2009   Formatting of this note might be different from the original.  well controlled on current meds, follows with psychiatrist in [place].  Had a   h/o severe depression 5 years ago, and had ECT.     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  B12 deficiency   Iron deficiency     Herniated lumbar intervertebral disc 11/12/2020   High cholesterol    History of blood clots    History of cervical spinal surgery 01/01/2018   Formatting of this note might be different from the original.  Fusion C6 and C7 per 2018 MRI report.     History of left hip replacement 01/14/2018   History of Roux-en-Y gastric bypass    Hoarseness 03/14/2023   Hyperlipidemia 08/19/2010   Hypertension    Hyponatremia 01/17/2018   Hypothyroidism    Hypothyroidism    IBS (irritable bowel syndrome) 04/06/2021   Impaired fasting glucose 08/19/2010   Incontinence 10/30/2013   Formatting of this note might be different from the original. Formatting of this note might be different from the original. Urinary Incontinence     Infertility, female    Insomnia 04/07/2009   Formatting of this note might be different from the original.  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  Followed by [facility]  Seen by Dr. Nyle Belling 03/01/15     S/p changing thoracic spinal cord stimulator generator 08/18/14.     Lumbar spine films show spinal cord generator in left flank area. Lumbar spine has multilevel degenerative spondylosis with disc space narrowing and some asymmetric disc where leading to slight thi   Low serum iron 04/23/2019   Lumbar post-laminectomy syndrome 07/06/2023    Migraine 04/07/2009   Muscle disorder    Muscle disorder    Muscle tension dysphonia 03/12/2023   Myoadenylate deaminase deficiency (HCC)    Myopathy 04/07/2009   Formatting of this note might be different from the original.  Follows with Rheumatology in [facility] of this note might be different from the original.  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  Getting iron infusion via port at hematalogy/oncology center at [facility].   Was recently hospitalized in [facility] for severe anemia     Formatting of this note might be different from the original.  on OTC b12 currently     Vitamin D  deficiency     Past Surgical History:  Procedure Laterality Date   ABDOMINAL HYSTERECTOMY     APPENDECTOMY     CARPAL TUNNEL RELEASE Bilateral 2023   with thumb joint replacement   CERVICAL FUSION     CHOLECYSTECTOMY     FOOT SURGERY Bilateral    fusion   GASTRIC BYPASS     IR RADIOLOGIST EVAL & MGMT  03/14/2024   SPINAL CORD STIMULATOR INSERTION  07/2023   TOTAL HIP ARTHROPLASTY Bilateral    11/2016 & 05/2017    Allergies: Compazine [prochlorperazine], Phenothiazines, Prednisone, and Pregabalin  Medications: Prior to Admission medications   Medication  Sig Start Date End Date Taking? Authorizing Provider  buPROPion  (WELLBUTRIN  XL) 300 MG 24 hr tablet Take 1 tablet (300 mg total) by mouth daily. Take in the evening 01/16/24   Cherre Cornish, NP  desvenlafaxine  (PRISTIQ ) 100 MG 24 hr tablet Take 1 tablet (100 mg total) by mouth daily. 01/22/24   Wray Heady, MD  FLUoxetine  (PROZAC ) 10 MG capsule Take 1 capsule (10 mg total) by mouth daily. 01/22/24   Wray Heady, MD  gabapentin  (NEURONTIN ) 600 MG tablet Take 1 tablet (600 mg total) by mouth 3 (three) times daily. 01/23/24   [name], MD  lisinopril  (ZESTRIL ) 10 MG tablet Take 1 tablet (10 mg total) by mouth daily. 10/04/23 02/21/24  Revankar, Micael Adas, MD  Magnesium 250 MG TABS Take 1 tablet by mouth daily.    [provider]  Multiple Vitamin (MULTI-VITAMIN) tablet Take 1 tablet by mouth daily.    [provider]  nystatin  (MYCOSTATIN /NYSTOP ) powder APPLY TO THE AFFECTED AREA(S) EXTERNALLY TWICE DAILY 02/05/24   Cherre Cornish, NP  oxyCODONE -acetaminophen  (PERCOCET) 7.5-325 MG tablet Take 1 tablet by mouth every 8 (eight) hours as  needed for severe pain (pain score 7-10). 03/11/24   Leonides Ramp, FNP  propranolol  (INDERAL ) 20 MG tablet TAKE 1 TABLET(20 MG) BY MOUTH DAILY AS NEEDED 06/01/23   Cherre Cornish, NP  solifenacin  (VESICARE ) 10 MG tablet Take 1 tablet (10 mg total) by mouth daily. 02/28/24   Stoneking, Ponce Brisker., MD  temazepam  (RESTORIL ) 22.5 MG capsule TAKE ONE CAPSULE BY MOUTH AT NIGHT 11/09/23   Cherre Cornish, NP  tiZANidine  (ZANAFLEX ) 2 MG tablet TAKE 1 TABLET (2 MG TOTAL) BY MOUTH IN THE MORNING AND AT BEDTIME. 06/25/23   Breeback, Elvie Hammed, PA-C     Family History  Problem Relation Age of Onset   Hyperlipidemia Mother    Hypertension Mother    Heart attack Mother    Heart disease Mother    Sudden death Mother    Depression Mother    Anxiety disorder Mother    Obesity Mother    Anxiety disorder Father    Depression Father    Cancer Father    Hyperlipidemia Father    Hypertension Father    Lung cancer Father    Prostate cancer Father    Kidney cancer Brother    Healthy Daughter    Healthy Son     Social History   Socioeconomic History   Marital status: Widowed    Spouse name: Not on file   Number of children: 2   Years of education: 45   Highest education level: 12th grade  Occupational History    Comment: Retired.   Occupation: Retired  Tobacco Use   Smoking status: Never    Passive exposure: Past   Smokeless tobacco: Never  Vaping Use   Vaping status: Never Used  Substance and Sexual Activity   Alcohol use: Yes    Comment: rarely   Drug use: Never   Sexual activity: Not Currently  Other Topics Concern   Not on file  Social History Narrative   Lives alone. She has two children and 4 grandchildren. She stays active, enjoys doing crafts and painting.    Social Drivers of Corporate investment banker Strain: Low Risk  (08/27/2023)   Received [facility]   Overall Financial Resource Strain (CARDIA)    Difficulty of Paying Living Expenses: Not hard at all  Food Insecurity: No  Food Insecurity (08/27/2023)   Received from  [facility]   Hunger Vital Sign    Worried About Running Out of Food in the Last Year: Never true    Ran Out of Food in the Last Year: Never true  Transportation Needs: No Transportation Needs (08/27/2023)   Received from [facility]   PRAPARE - Transportation    Lack of Transportation (Medical): No    Lack of Transportation (Non-Medical): No  Physical Activity: Inactive (01/09/2022)   Exercise Vital Sign    Days of Exercise per Week: 0 days    Minutes of Exercise per Session: 0 min  Stress: No Stress Concern Present (08/17/2023)   Received from [facility] - Occupational Stress Questionnaire    Feeling of Stress : Not at all  Social Connections: Unknown (03/26/2023)   Received from [facility]   Social Network    Social Network: Not on file    Review of Systems: A 12 point ROS discussed and pertinent positives are indicated in the HPI above.  All other systems are negative.  Review of Systems  Vital Signs: There were no vitals taken for this visit.  Advance Care Plan: The advanced care plan/surrogate decision maker was discussed at the time of visit and the patient did not wish to discuss or was not able to name a surrogate decision maker or provide an advance care plan.    Physical Exam Constitutional:      General: She is not in acute distress.    Appearance: Normal appearance.  HENT:     Head: Normocephalic and atraumatic.  Eyes:     General: No scleral icterus. Cardiovascular:     Rate and Rhythm: Normal rate.  Pulmonary:     Effort: Pulmonary effort is normal.  Abdominal:     General: There is no distension.     Palpations: Abdomen is soft.     Tenderness: There is no abdominal tenderness.  Musculoskeletal:     Right knee: Swelling and bony tenderness present.     Left knee: Bony tenderness present. No swelling.       Legs:     Comments: Right knee  noticeably swollen with osteophytes present. TTP along both the lateral and medial femoral condyles.  Left knee more normal in appearance but there is still TTP along the lateral femoral condyle.   Skin:    General: Skin is warm and dry.  Neurological:     Mental Status: She is alert and oriented to person, place, and time.  Psychiatric:        Mood and Affect: Mood normal.        Behavior: Behavior normal.       Imaging: IR Radiologist Eval & Mgmt Result Date: 03/14/2024 EXAM: NEW PATIENT OFFICE VISIT CHIEF COMPLAINT: SEE NOTE IN EPIC HISTORY OF PRESENT ILLNESS: SEE NOTE IN EPIC REVIEW OF SYSTEMS: SEE NOTE IN EPIC PHYSICAL EXAMINATION: SEE NOTE IN EPIC ASSESSMENT AND PLAN: SEE NOTE IN EPIC Electronically Signed   By: Fernando Hoyer M.D.   On: 03/14/2024 12:12   MR BRAIN/IAC W WO CONTRAST Result Date: 02/20/2024 Table formatting from the original result was not included. [facility] NEUROIMAG[facility] REPORT STUDY DATE: 02/19/24 PATIENT NAME: Makaylie Dedeaux DOB: [birth year] MRN: [id number] ORDERING CLINICIAN: Cassandra Cleveland, MD CLINICAL HISTORY: 73 y.o. year old female with vertigo.  Evaluate for possible internal auditory canal nodules noted on prior MRI. 1. Benign paroxysmal positional vertigo of right ear   EXAM: MR BRAIN/IAC  W WO CONTRAST TECHNIQUE: MRI of the brain with and without contrast was obtained utilizing multiplanar, multiecho pulse sequences. CONTRAST: Diagnostic Product Medications (last 72 hours)   Date/Time Action Medication Dose  02/19/24 1556 Contrast Given  gadopiclenol  (VUEWAY ) 0.5 MMOL/ML solution 6 mL 8 mL   COMPARISON: 02/07/24 IMAGING SITE: [facility] AT [address] [place] [address] FINDINGS: No abnormal lesions are seen on diffusion-weighted views to suggest acute ischemia. The cortical sulci, fissures and cisterns are normal in size and appearance. Lateral, third and fourth ventricle are normal in  size and appearance. No extra-axial fluid collections are seen. No evidence of mass effect or midline shift.  Mild periventricular and subcortical foci of chronic small vessel ischemic disease.  No abnormal lesions are seen on post contrast views.  Thin cut views of internal auditory canals, semicircular canals, CN 7/8 complexes and brainstem structures are unremarkable. No abnormal compressive or inflammatory lesions.  Small enhancing vascular structure near the left internal auditory canal, likely normal variant. On sagittal views the posterior fossa, pituitary gland and corpus callosum are unremarkable. No evidence of intracranial hemorrhage on SWI views. The orbits and their contents, paranasal sinuses and calvarium are unremarkable.  Bilateral lens extractions.  Intracranial flow voids are present.   MRI brain / IAC (with and without) demonstrating: -Small enhancing vascular structure near the left internal auditory canal, likely normal variant. -No definite nodules noted in the bilateral internal auditory canals. -Mild chronic mild small vessel ischemic disease. -No acute findings. INTERPRETING PHYSICIAN: Omega Bible, MD Certified in Neurology, Neurophysiology and Neuroimaging [facility] [address], [address] [place], [place] [address] [phone]   Labs:  CBC: Recent Labs    01/07/24 1315  WBC 5.6  HGB 11.5*  HCT 35.9*  PLT 242    COAGS: No results for input(s): "INR", "APTT" in the last 8760 hours.  BMP: Recent Labs    03/27/23 0957 08/14/23 1357  NA 137 136  K 4.2 4.8  CL 95* 95*  CO2 23 26  GLUCOSE 97 101*  BUN 11 17  CALCIUM 9.8 9.6  CREATININE 0.68 0.68    LIVER FUNCTION TESTS: Recent Labs    03/27/23 0957 04/10/23 1624 08/14/23 1357  BILITOT 0.4 0.4 0.4  AST 42* 29 30  ALT 35* 38* 29  ALKPHOS 107 98 107  PROT 6.8 7.2 6.9  ALBUMIN 4.5 4.6 4.2    TUMOR MARKERS: No results for input(s): "AFPTM", "CEA", "CA199", "CHROMGRNA" in  the last 8760 hours.  Assessment and Plan:  Pleasant 73 year old female with a long history of osteoarthritis of multiple joints.  She is currently suffering with severe bilateral knee pain worse on the right than the left due to underlying osteoarthritis.  She is not a candidate for total knee arthroplasty given her lack of social support.  Her [place] and Child psychotherapist osteoarthritis index [facility]) score was 61.  She is an excellent candidate for geniculate artery embolization to treat her severe chronic knee pain.  She would first like to try the procedure on the more symptomatic right knee and then determine whether she would like to pursue left knee therapy as well.   1.)  Please schedule for outpatient RIGHT geniculate artery embolization.    Thank you for this interesting consult.  I greatly enjoyed meeting Sham Alviar and look forward to participating in their care.  A copy of this report was sent to the requesting provider on this  date.  Electronically Signed: Roxie Cord 03/14/2024, 12:46 PM   I spent a total of  40 Minutes  in face to face in clinical consultation, greater than 50% of which was counseling/coordinating care for chronic bilateral knee pain, worse on the right.

## 2024-03-17 ENCOUNTER — Other Ambulatory Visit: Payer: Self-pay | Admitting: Interventional Radiology

## 2024-03-17 ENCOUNTER — Other Ambulatory Visit: Payer: Self-pay | Admitting: Sports Medicine

## 2024-03-17 DIAGNOSIS — M869 Osteomyelitis, unspecified: Secondary | ICD-10-CM

## 2024-03-17 DIAGNOSIS — M7061 Trochanteric bursitis, right hip: Secondary | ICD-10-CM

## 2024-03-19 ENCOUNTER — Encounter: Payer: Self-pay | Admitting: Physical Therapy

## 2024-03-19 ENCOUNTER — Ambulatory Visit: Admitting: Physical Therapy

## 2024-03-19 DIAGNOSIS — M6281 Muscle weakness (generalized): Secondary | ICD-10-CM

## 2024-03-19 DIAGNOSIS — M25551 Pain in right hip: Secondary | ICD-10-CM | POA: Diagnosis not present

## 2024-03-19 DIAGNOSIS — R42 Dizziness and giddiness: Secondary | ICD-10-CM

## 2024-03-19 NOTE — Therapy (Signed)
 OUTPATIENT PHYSICAL THERAPY LOWER EXTREMITY and VESTIBULAR RECERTIFICATION   Patient Name: Heather Thompson MRN: [id number] DOB:1951-01-19, 73 y.o., female Today's Date: 03/19/2024  END OF SESSION:  PT End of Session - 03/19/24 1509     Visit Number 20    Number of Visits 32    Date for PT Re-Evaluation 05/07/24    Authorization Type Medicare    Authorization - Visit Number 20    Progress Note Due on Visit 30    PT Start Time 1400    PT Stop Time 1445    PT Time Calculation (min) 45 min    Behavior During Therapy [facility] for tasks assessed/performed                                Past Medical History:  Diagnosis Date   Abnormality of gait 04/23/2019   Acquired hypothyroidism 07/29/2021   Allergic rhinitis due to animal hair and dander 12/02/2015   Formatting of this note might be different from the original.  [facility]  Seen by Dr. Travis Friedman 11/11/15     Plan- Received allergy shots today. Follow up 1 year.     Allergy    Anemia    Anemia    Anxiety    Arthritis of scaphoid-trapezium-trapezoid joint of right hand 09/10/2023   B12 deficiency    Back pain    Chronic fatigue 07/29/2021   Chronic fatigue syndrome    Chronic throat clearing 03/14/2023   Clotting disorder (HCC)    Constipation    Depression with anxiety 04/07/2009   Formatting of this note might be different from the original.  well controlled on current meds, follows with psychiatrist in [place]. Had a   h/o severe depression 5 years ago, and had ECT.     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  B12 deficiency   Iron deficiency     Herniated lumbar intervertebral  disc 11/12/2020   High cholesterol    History of blood clots    History of cervical spinal surgery 01/01/2018   Formatting of this note might be different from the original.  Fusion C6 and C7 per 2018 MRI report.     History of left hip replacement 01/14/2018   History of Roux-en-Y gastric bypass    Hoarseness 03/14/2023   Hyperlipidemia 08/19/2010   Hypertension    Hyponatremia 01/17/2018   Hypothyroidism    Hypothyroidism    IBS (irritable bowel syndrome) 04/06/2021   Impaired fasting glucose 08/19/2010   Incontinence 10/30/2013   Formatting of this note might be different from the original. Formatting of this note might be different from the original. Urinary Incontinence     Infertility, female    Insomnia 04/07/2009   Formatting of this note might be different from the original.  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  [facility]  Seen by Dr. Nyle Belling 03/01/15     S/p changing thoracic spinal cord stimulator generator 08/18/14.     Lumbar spine films show spinal cord generator in left flank area. 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  Follows with Rheumatology in [facility] of this note might be different from the original.  Myoadenylate deaminase deficiency, diagnosed back in 1984, after muscle   biopsies     Neutrophilic leukocytosis 08/12/2012   OAB (overactive  bladder) 08/28/2023   Obesity (BMI 30-39.9) 01/01/2018   Osteoarthritis    Osteomalacia 04/23/2019   Osteoporosis, senile 11/15/2010   Other myositis, left thigh 04/24/2023   Pain management contract agreement 11/17/2016   Formatting of this note might be different from the original.  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  Getting iron infusion via port at hematalogy/oncology center at [facility].   Was recently hospitalized in [facility] for severe anemia     Formatting of this note might be different from the original.  on OTC b12 currently     Vitamin D  deficiency    Past Surgical History:  Procedure Laterality Date   ABDOMINAL HYSTERECTOMY     APPENDECTOMY     CARPAL TUNNEL RELEASE Bilateral 2023   with thumb joint replacement   CERVICAL  FUSION     CHOLECYSTECTOMY     FOOT SURGERY Bilateral    fusion   GASTRIC BYPASS     IR RADIOLOGIST EVAL & MGMT  03/14/2024   SPINAL CORD STIMULATOR INSERTION  07/2023   TOTAL HIP ARTHROPLASTY Bilateral    11/2016 & 05/2017   Patient Active Problem List   Diagnosis Date Noted   Palpitations 10/04/2023   Cardiac murmur 10/04/2023   Allergy    Anemia    Anxiety    B12 deficiency    Back pain    Chronic fatigue syndrome    Clotting disorder (HCC)    Constipation  Edema of both lower extremities    Fatty liver    Fibromyalgia    Gallbladder problem    High cholesterol    History of blood clots    History of Roux-en-Y gastric bypass    Hypothyroidism    Infertility, female    Joint pain    Kidney stones    Muscle disorder    Osteoarthritis    Prediabetes    Salzmann nodular degeneration    SOBOE (shortness of breath on exertion)    Swallowing difficulty    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   Arthritis of scaphoid-trapezium-trapezoid joint of right hand 09/10/2023   Urge incontinence 08/28/2023   OAB (overactive bladder) 08/28/2023   Lumbar post-laminectomy syndrome 07/06/2023   Snapping hip syndrome, 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   Other myositis, left thigh 04/24/2023   Sacroiliitis, not elsewhere classified (HCC) 04/24/2023   Dysphagia 04/17/2023   Elevated liver enzymes 04/02/2023   Elevated TSH 04/02/2023   Chronic throat clearing 03/14/2023   Globus sensation 03/14/2023   Hoarseness 03/14/2023   Muscle tension dysphonia 03/12/2023   Unspecified inflammatory spondylopathy, 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   Primary osteoarthritis of both knees 12/21/2021   Port-A-Cath in place 07/29/2021   Acquired hypothyroidism 07/29/2021   Chronic pain syndrome 07/29/2021   Vitamin B12 deficiency 07/29/2021   Vitamin D  deficiency 07/29/2021   Postmenopausal estrogen deficiency 07/29/2021   IBS (irritable bowel syndrome) 04/06/2021   Retention of urine 04/06/2021    Herniated lumbar intervertebral disc 11/12/2020   Skin sensation disturbance 11/12/2020   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   Spondylopathy, unspecified 11/12/2020   Trochanteric bursitis of both hips 11/19/2019   Low serum iron 04/23/2019   Osteomalacia 04/23/2019   Abnormality of gait 04/23/2019   History of bilateral hip arthroplasty 06/26/2018   Gastroesophageal reflux disease 06/18/2018   Hypertension 06/18/2018   Intertrigo 04/30/2018   Lentigines 04/30/2018   Hyponatremia 01/17/2018   History of cervical spinal surgery 01/01/2018   Obesity (BMI 30-39.9) 01/01/2018   SK (seborrheic keratosis) 04/18/2017   Pain management contract agreement 11/17/2016   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   Allergic rhinitis due to animal hair and dander 12/02/2015   Low back pain 03/03/2015   Incontinence 10/30/2013   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   Neutrophilic leukocytosis 08/12/2012   Thrombocytosis 08/12/2012   Osteoporosis, senile 11/15/2010   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   Hyperlipidemia 08/19/2010   Impaired fasting glucose 08/19/2010   Polyarthritis 09/28/2009   Degenerative disc disease 04/07/2009   Depression with anxiety 04/07/2009   Myopathy 04/07/2009   Insomnia 04/07/2009   Migraine 04/07/2009   Scoliosis 04/07/2009   Seasonal allergies 04/07/2009   Vitamin B12 deficiency anemia 04/07/2009    PCP: Cherre Cornish  REFERRING PROVIDER: Carlye Child DIAG: bilateral trochanteric bursitis, vertigo  THERAPY DIAG:  Bilateral hip pain  Muscle weakness (generalized)  Dizziness and giddiness  Rationale for Evaluation and Treatment: Rehabilitation  ONSET DATE: 04/2023  SUBJECTIVE:   SUBJECTIVE STATEMENT: Pt reports her neck and scapular pain are very severe. She states that she is "in misery" due to pain. She has knee procedure scheduled for 2 weeks from now and her MRI of neck and back for the end of May    PERTINENT HISTORY: Bilat THA 2018,  fibromyalgia, OA, spinal cord stimulator, bilat TC joint fusion  Pt had bilateral hip replacements in 2018. Pt began having Lt hip and groin pain last summer that was so severe she went to ED. She was eventually diagnosed  with bursitis. Since that time the pain has continued and is "always there". She now has bilateral hip pain, groin pain. Hips are tender to touch. Pain increases with laying on her sides, pain with sit to stand, prolonged standing and walking. Pt has not found a way to relieve pain  Vestibular eval: pt states dizziness has been going on for "quite a while". Pt states she feels "room is spinning" when she goes from sit to stand. She reports she also feels that sensation when she goes from supine to sit but it is not as severe. She also states she veers to the right while walking. She also states that if she is standing still she will occasionally feel like she is "tipping over" to the right. The sensation only lasts a few seconds. Pt states she has had a cardiac work up which ruled out cardiac cause of dizziness PAIN:  Are you having pain? Yes: NPRS scale: 6/10 currently, 8-9/10 at worst Pain location: bilat hips Lt > Rt Pain description: sore, tender Aggravating factors: sitting in recliner, laying on sides, sit to stand Relieving factors: unknown  PRECAUTIONS: pt has spinal cord stimulator  RED FLAGS: None   WEIGHT BEARING RESTRICTIONS: No  FALLS:  Has patient fallen in last 6 months? No  OCCUPATION: retired  PLOF: Independent  PATIENT GOALS: reduce pain  NEXT MD VISIT: February 2025  OBJECTIVE:  Note: Objective measures were completed at Evaluation unless otherwise noted.  DIAGNOSTIC FINDINGS: x ray performed but not read at time as eval  PATIENT SURVEYS:  FOTO 39   MUSCLE LENGTH: Hamstrings: decreased bilat Hip flexors: decreased bilat   PALPATION: TTP bilat hip flexors, ITB, glutes, HS Increased mm spasticity bilat hamstrings, glutes, quads  LOWER  EXTREMITY ROM:  Active ROM Right eval Left eval  Hip flexion    Hip extension    Hip abduction    Hip adduction    Hip internal rotation    Hip external rotation 30 50  Knee flexion    Knee extension    Ankle dorsiflexion    Ankle plantarflexion    Ankle inversion    Ankle eversion     (Blank rows = not tested)  LOWER EXTREMITY MMT:  MMT Right eval Left eval Right 4/23 Left 4/23  Hip flexion 3+ 3 3- 4+  Hip extension 3 3 3 4   Hip abduction 3 3 3- 4  Hip adduction      Hip internal rotation      Hip external rotation 3 3+ 3 4  Knee flexion      Knee extension      Ankle dorsiflexion      Ankle plantarflexion      Ankle inversion      Ankle eversion       (Blank rows = not tested)   FUNCTIONAL TESTS:  5 times sit to stand: 18.85 seconds  VESTIBULAR ASSESSMENT:     SYMPTOM BEHAVIOR:  Subjective history: pt states dizziness has been going on for "quite a while". Pt states she feels "room is spinning" when she goes from sit to stand. She reports she also feels that sensation when she goes from supine to sit but it is not as severe. She also states she veers to the right while walking. She also states that if she is standing still she will occasionally feel like she is "tipping over" to the right. The sensation only lasts a few seconds.  Non-Vestibular symptoms:  none reported  Type  of dizziness: Spinning/Vertigo  Frequency: multiple times a day  Duration: seconds  Aggravating factors: Induced by position change: supine to sit, sit to stand, and walking  Relieving factors: slow movements  Progression of symptoms: unchanged  OCULOMOTOR EXAM:  Ocular Alignment: normal  Ocular ROM: No Limitations  Spontaneous Nystagmus: absent  Gaze-Induced Nystagmus: age appropriate nystagmus at end range  Smooth Pursuits: saccades and pt reports "spinning" sensation when focusing on pen  Saccades: intact     VESTIBULAR - OCULAR REFLEX:   Slow VOR: Positive Bilaterally  increased spinning sensation  VOR Cancellation: Comment: increased spinning sensation  Head-Impulse Test: HIT Right: positive HIT Left: negative     POSITIONAL TESTING: Right Dix-Hallpike: no nystagmus Left Dix-Hallpike: no nystagmus Right Roll Test: no nystagmus Left Roll Test: no nystagmus   MOTION SENSITIVITY:    Motion Sensitivity Quotient Intensity: 0 = none, 1 = Lightheaded, 2 = Mild, 3 = Moderate, 4 = Severe, 5 = Vomiting  Intensity  1. Sitting to supine   2. Supine to L side   3. Supine to R side   4. Supine to sitting   5. L Hallpike-Dix   6. Up from L    7. R Hallpike-Dix   8. Up from R    9. Sitting, head  tipped to L knee   10. Head up from L  knee   11. Sitting, head  tipped to R knee   12. Head up from R  knee   13. Sitting head turns x5   14.Sitting head nods x5   15. In stance, 180  turn to L    16. In stance, 180  turn to R      [facility] Adult PT Treatment:                                                DATE: 03/19/24 Therapeutic Exercise/Activity: Strength measurements (see above) Hip ER/IR isometrics in modified butterfly position (Rt) Review of progress towards goals and remaining goals Review of updated HEP Manual Therapy: STM and TPR Rt hip flexors, adductors   [facility] Adult PT Treatment:                                                DATE: 03/13/24 Therapeutic Exercise/Activity/NMR: Hip ER/IR isometrics in modified butterfly position (Rt) Hip add isometric green bolster 10 x 5 sec hold Hip abd isometric 10 x 5 sec hold Seated hip flexion with red TB resistance 2 x 10 Manual Therapy: STM and TPR Rt hip flexors, adductors   [facility] Adult PT Treatment:                                                DATE: 03/11/24 Therapeutic Exercise/Activity/NMR: 5xSTS : 23.11 seconds DGI: 21/24 Seated hip flexion with red TB resistance 2 x 10 Hip flexion abd/add over dumbell to simulate in /out of car Isometrics hip ER/IR and hip add/abd in hip flexed  position Seated hip flexor stretch Reclined position butterfly stretch for Rt hip adductors as tolerated Manual Therapy: STM and TPR as tolerated Rt  hip adductors and hip flexors   [facility] Adult PT Treatment:                                                DATE: 03/04/24 Therapeutic Exercise/Activity: Seated hip flexion with red TB resistance 2 x 10 Seated Hip flexion with abd/add x 10 - pain Seated hip add ball squeeze 10 x 3 sec hold Seated hip abd isometric 10 x 3 sec hold TKE ball on wall x 12 bilat  Neuromuscular re-ed: Habituation reclined to sitting upright                                                                                                                      PATIENT EDUCATION:  Education details: PT POC and goals, HEP Person educated: Patient Education method: Programmer, multimedia, Facilities manager, and Handouts Education comprehension: verbalized understanding and returned demonstration  HOME EXERCISE PROGRAM: Access Code: [email or web address] URL: [email or web address] Date: 03/19/2024 Prepared by: Lowery Rue  Exercises - Sit to Stand  - 1 x daily - 7 x weekly - 2 sets - 10 reps - Seated Hip Flexor Stretch  - 1 x daily - 7 x weekly - 1 sets - 3 reps - 30 seconds hold - Resistance Pulldown with March  - 1 x daily - 7 x weekly - 3 sets - 10 reps - Standing Bilateral Low Shoulder Row with Anchored Resistance  - 1 x daily - 7 x weekly - 3 sets - 10 reps - Standing Isometric Hip Flexion with Ball at [employer]  - 1 x daily - 7 x weekly - 2 sets - 10 reps - Standing Isometric Hip Abduction with Ball on Wall  - 1 x daily - 7 x weekly - 2 sets - 10 reps - Seated Hip Adduction Isometrics with Ball  - 1 x daily - 7 x weekly - 2 sets - 10 reps - Supine Hip Adductor Stretch  - 1 x daily - 7 x weekly - 1 sets - 3 reps - 30 seconds hold - standing marching with head turns  - 1 x daily - 7 x weekly - 3 sets - 10 reps  ASSESSMENT:  CLINICAL IMPRESSION: Pt has met vestibular goals  and reports decreased dizziness with mobility. Pt continues to be limited by pain which is limiting progress with hip strength and functional mobility. She has responded well to the addition of manual therapy during the last 2 visits and will continue to benefit from skilled PT to address remaining goals.  OBJECTIVE IMPAIRMENTS: decreased activity tolerance, decreased mobility, difficulty walking, decreased ROM, decreased strength, increased muscle spasms, impaired flexibility, and pain.     GOALS: Goals reviewed with patient? Yes  SHORT TERM GOALS: Target date: 01/09/2024   Pt will be independent with initial HEP Baseline: Goal status: MET  2.  Pt will improve FOTO to > = 45  to demo improving functional mobility Baseline: 33 on 01/11/24 Goal status: IN PROGRESS   LONG TERM GOALS: Target date: 04/30/2024    Pt will be independent with advanced HEP and plan for continued exercise in community Baseline:  Goal status: INITIAL  2.  Pt will improve FOTO to >= 50 to demo improved functional mobility Baseline:  Goal status: IN PROGRESS  3.  Pt will improve hip strength to 4+/5 bilat to improve standing and walking tolerance Baseline: see above Goal status: IN PROGRESS  4.  Pt will tolerate laying on both sides with pain <= 2/10 Baseline:  Goal status: MET  5.  Pt will improve 5 x STS to <= 15 seconds Baseline:  Goal status: IN PROGRESS - 23.11 seconds on 03/11/24  6.  Pt will report transitioning sit to stand without dizziness symptoms 75% of the time Baseline:  Goal status: MET 7.  Pt will demo decreased risk of falls with DGI >= 21/24 Baseline: 18/24 Goal status: MET - 21/24 on 03/11/24  8.  Pt will report baseline pain in hips 2-4/10 Baseline: 4-8/10 Goal status: INITIAL        PLAN:  PT FREQUENCY: 2x/week  PT DURATION: 6 weeks  PLANNED INTERVENTIONS: 97164- PT Re-evaluation, 97110-Therapeutic exercises, 97530- Therapeutic activity, 97112- Neuromuscular  re-education, 97535- Self Care, 87564- Manual therapy, [id number]- Aquatic Therapy, 97035- Ultrasound, [id number]- Ionotophoresis 4mg /ml Dexamethasone , Patient/Family education, Balance training, Taping, Dry Needling, Cryotherapy, and Moist heat  PLAN FOR NEXT SESSION:  manual as indicated, hip stability, hip strengthening   Veida Spira, PT 03/19/2024, 3:13 PM

## 2024-03-20 ENCOUNTER — Encounter: Payer: Self-pay | Admitting: Professional

## 2024-03-20 ENCOUNTER — Ambulatory Visit: Admitting: Professional

## 2024-03-20 DIAGNOSIS — F4321 Adjustment disorder with depressed mood: Secondary | ICD-10-CM

## 2024-03-20 DIAGNOSIS — F332 Major depressive disorder, recurrent severe without psychotic features: Secondary | ICD-10-CM | POA: Diagnosis not present

## 2024-03-20 DIAGNOSIS — F5102 Adjustment insomnia: Secondary | ICD-10-CM

## 2024-03-20 DIAGNOSIS — F411 Generalized anxiety disorder: Secondary | ICD-10-CM | POA: Diagnosis not present

## 2024-03-20 NOTE — Progress Notes (Addendum)
 [facility] Counselor/Therapist Progress Note  Patient ID: Heather Thompson, MRN: [id number],    Date: 03/20/2024  Time Spent: 53 minutes 1107am-1200pm  Treatment Type: Individual Therapy  Risk Assessment: Danger to Self:  No; pt commented on having pain medication that is lethal and upon further questioning she stated there is nothing that couple happen that would ever cause her to attempt suicide Self-injurious Behavior: No Danger to Others: No  Subjective: The patient arrived on time for her inperson appointment  Issues addressed: 1-going to [place] -for grandson's graduation -excited but feels like a burden -hoping she has a nice time on the trip with her family 2-trauma -discussed perceptions and how early wounding can impact our perspective  -she believes her family did not want her to move back to [place] -pt believes daughter blames her for pt's spouse's death in [place] and lost time that daughter had with him -shared observations that what she has shared about her daughter did not indicate the same thing to me   -daughter told her she could live where she wanted, what did she want to do and pt chose [place]   -family wants nothing to do with her, however, daughter speaks to her daily and invited her for grandson's graduation -Clinician asked if it was possible that the rejection she got as a child might cause her to think that everyone rejects her   -husband left her in death, belief that family is glad she's in [place] and not in [place] -pt admits that she can see Clinician perspective and it makes sense to her -discussed potential to begin addressing her traumas so that she no longer has to carry her pain and she is willing  Treatment Plan Problems: Financial Stress, Low Self-Esteem, Attention Deficit Disorder (ADD) - Adult Symptoms: Unable to concentrate or pay attention to things of low interest, even when those things are important to his/her life. Restless and fidgety; unable to be  sedentary for more than a short time. Disorganized in most areas of his/her life. Starts many projects but rarely finishes any. Chronic low self-esteem. Tendency toward addictive behaviors. Indebtedness and overdue bills that exceed ability to meet monthly payments. A long-term lack of discipline in money management that has led to excessive indebtedness. A pattern of impulsive spending that does not consider the eventual financial consequences. Inability to accept compliments. Makes self-disparaging remarks; sees self as unattractive, worthless, a loser, a burden, unimportant; takes blame easily. Difficulty in saying no to others; assumes not being liked by others. Fear of rejection by others, especially peer group. Lack of any goals for life and setting of inappropriately low goals for self. Inability to identify positive characteristics of self. Anxious and uncomfortable in social situations. Goals: Reduce impulsive actions while increasing concentration and focus on low-interest activities. Minimize ADD behavioral interference in daily life. Sustain attention and concentration for consistently longer periods of time. Achieve an inner strength to control personal impulses, cravings, and desires that directly or indirectly increase debt irresponsibly. Resolve financial crisis with a path to eliminate debt. Revise spending patterns to not exceed income. Gain a new sense of self-worth in which the substance of one's value is not attached to the capacity to do things or own things that cost money. Understand personal desires, insecurities, and anxieties that make overspending possible. Elevate self-esteem. Develop a consistent, positive self-image. Demonstrate improved self-esteem through more pride in appearance, more assertiveness, greater eye contact, and identification of positive traits in self-talk messages. Establish an inward sense  of self-worth, confidence, and  competence. Interact socially without undue distress or disability. Objectives target date for all objectives is 03/25/2025: Identify the current specific ADD behaviors that cause the most difficulty. List the negative consequences of the ADD problematic behavior. Learn and implement organization and planning skills. Acknowledge procrastination and the need to reduce it. Learn and implement skills to reduce procrastination. Learn and implement skills to reduce the disruptive influence of distractibility. Combine skills learned in therapy into a new daily approach to managing ADHD. Use cognitive and behavioral strategies to control the impulse to make unnecessary and unaffordable purchases. Report instances of successful control over impulse to spend on unnecessary expenses. Increase insight into the historical and current sources of low self-esteem. Decrease the frequency of negative self-descriptive statements and increase frequency of positive self-descriptive statements. Articulate a plan to be proactive in trying to get identified needs met. Form realistic, appropriate, and attainable goals for self in all areas of life. Take verbal responsibility for accomplishments without discounting. Identify and replace negative self-talk messages used to reinforce low self-esteem. Interventions: Assist the client in identifying the current specific behaviors that cause him/her the most difficulty functioning as part of identifying treatment targets (i.e., a functional analysis). Select situations in which the client will be increasingly challenged to apply his/her new strategies for managing ADHD, starting with situations highly likely to be successful. Teach the client meditational and self-control strategies (e.g., "stop, look, listen, and think") to delay the need for instant gratification and inhibit impulses to achieve more meaningful, longer-term goals. Teach the client to apply new  problem-solving skills to planning as a first step in overcoming procrastination; for each plan, break it down into manageable time-limited steps to reduce the influence of distractibility. Assist the client in identifying positives and negatives of procrastinating toward the goal of engaging him/her in staying focused. Teach the client to use timers or other cues to remind him/her to stop tasks before he/she gets distracted in an effort to reduce the time they may be distracted and off-task (see Mastering Your Adult ADHD: Therapist Guide by Loura Rower al.). Teach the client to break down tasks into meaningful smaller units that can be completed without being distracted based on their demonstrated attention span. Teach the client stimulus control techniques that use external structure (e.g., lists, reminders, files, daily rituals) to improve on-task behavior; remove distracting stimuli in the environment; encourage the client to reward himself/herself for successful focus and follow-through. Assess the client's typical attention span by having them do a few "boring" tasks (e.g., sorting bills, reading something uninteresting) to the point that they report distraction; use this as an approximate measure of their typical attention span. Teach the client organization and planning skills including the routine use of a calendar and daily task list. Teach the client problem-solving skills (i.e., identify problem, brainstorm all possible options, evaluate the pros and cons of each option, select best option, implement a course of action, and evaluate results) as an approach to planning; for each plan, break it down into manageable time-limited steps to reduce the influence of distractibility. Assign the client to make a list of negative consequences that he/she has experienced or that could result from a continuation of the problematic behavior; process the list (or assign "Impulsive Behavior Journal" in the Adult  Psychotherapy Homework Planner by Beacher Bottoms). Help the client identify his/her distorted, negative beliefs about self and the world and replace these messages with more realistic, affirmative messages (or assign "Journal and Replace Self-Defeating  Thoughts" in the Adult Psychotherapy Homework Planner by [facility] or read What to Say When You Talk to Yourself by Helmstetter). Assist the client in becoming aware of how he/she expresses or acts out negative feelings about himself/herself. Help the client reframe his/her negative assessment of himself/herself. Help the client become aware of his/her fear of rejection and its connection with past rejection or abandonment experiences; begin to contrast past experiences of pain with present experiences of acceptance and competence. Ask the client to list accomplishments; process the integration of these into his/her self-image. Help the client analyze his/her goals to make sure they are realistic and attainable. Assign the client to make a list of goals for various areas of life and a plan for steps toward goal attainment. Assist the client in identifying and verbalizing his/her needs, met and unmet. Reinforce with praise and encouragement all of the client's reports of resisting the urge to overspend. Urge the client to avoid all impulse buying by delaying every purchase until after 24 hours of thought and by buying only from a prewritten list of items to buy (consider assigning "Impulsive Behavior Journal" from the Adult Psychotherapy Homework Planner by Beacher Bottoms). Teach the client the cognitive strategy of asking self before each purchase: Is this purchase absolutely necessary? Can we afford this? Do we have the cash to pay for this without incurring any further debt? Role-play situations in which the client must resist external pressure to spend beyond what he/she can afford (e.g., friend's invitation to golf or go shopping, child's request for a toy),  emphasizing being graciously assertive in refusing the request. Role-play situations in which the client must resist the inner temptation to spend beyond reasonable limits, emphasizing positive self-talk that compliments self for being disciplined. Objectives target date for all objectives is 03/13/2025: Identify the specific anxiety symptoms that are personally most disturbing or most contributing to impaired functioning. Learn and practice thought and behavioral control methods to minimize and control anxiety symptoms once they have begun. Identify specific thoughts that precipitate anxiety symptoms. Replace anxiety-producing thoughts with constructive thoughts. Verbalize an understanding of the general physical and cognitive manifestations of the causes for anxiety. Identify and clarify the patterns of anxiety precipitants and consequences. Identify whether the symptoms of depression seem to be primarily related to interpersonal relationships, stressful life events or circumstances, thoughts/beliefs, or behaviors. Verbalize an understanding of the general physical and psychological effects of depression. Replace depression-promoting thoughts with mood-elevating thoughts. Keep a daily record of mood rating from 1 to 10, noting associated behaviors, activities, events, people, and thoughts. Identify specific thoughts that precipitate depressive moods. Monitor and report depressive symptoms, completing self-report assessment on a periodic basis. Describe and evaluate significant past and current interpersonal relationships. Tell memories of the deceased person, starting with positive memories. Consider possible ways of increasing involvement with others. Accept the reality of loss and tolerate the pain of intense grief. Engage in events and activities that increase level of social involvements. Identify and monitor specific pain triggers. Learn and implement somatic skills such as relaxation  and/or biofeedback to reduce pain level. Learn mental coping skills and implement with somatic skills for managing acute pain. Interventions: Reinforce the client's belief system and/or refer to a spiritual leader who can support the client's faith. Explore and problem-solve with the client difficulties, fears, or barriers related to increasing social involvement. Point out to the client that engagement with others is associated with decreased feelings of loneliness, grief, depression, and hopelessness. Provide encouragement and support to  the client for increased engagement in contact with others. Clarify with the client that expression of negative thoughts or feelings about the deceased is not disloyal. Assist the client in remembering the loved one in realistic ways (i.e., includes attractions, positive traits, negative traits, conflicts, ambivalence, dependency, etc.), a memory that is neither idealized nor demonized, so that the client's relationship with the deceased person can be appraised realistically (or assign "Dear _____: A Letter to a Lost Loved One" in the Adult Psychotherapy Homework Planner, 2nd ed. by Beacher Bottoms). Assign the client to read about progressive muscle relaxation and other calming strategies in relevant books or treatment manuals (e.g., Progressive Relaxation Training by Juleen Oakland). Assign a homework exercise in which the client implements somatic pain management skills and records the result; review and process during the treatment session. Teach the client distraction techniques (e.g., pleasant imagery, counting techniques, alternative focal points) and how to use them for relaxation skills for the management of acute episodes of pain. Ask the client to create a list of activities that are pleasurable to him/her (or assign "Identify and Schedule Pleasant Activities" in the Adult Psychotherapy Homework Planner, 2nd ed. by Beacher Bottoms); process the list, developing a  plan of increasing the frequency of engaging in the selected pleasurable activities. Teach the client problem-solving skills to apply to removal of obstacles to implementing new skills; role-play implementation of these skills to obstacles. Assist the client in identifying the most effective personal stress management techniques (e.g., prayer, walking, baking, telephoning a friend), and encourage daily scheduling of these activities (or assign "Past Successful Anxiety Coping" in the Adult Psychotherapy Homework Planner, 2nd ed. by Beacher Bottoms). Teach the client the concept of finding a good match between the individual's capacities and the demands of the physical environment. If the physical environment is too demanding for the individual's capacities (e.g., frail older adult taking care of a large house), the individual can become overwhelmed and may need to move from large home to smaller accommodations or residence for older adults). While reviewing the client's anxiety symptom chart, help him/her recognize patterns associated with anxiety symptoms: sort out precipitants from consequences, identify the most intense or frequent precipitants, and identify the consequences that help to perpetuate maladaptive patterns. Assist the client in identifying his/her current attempts to reducing anxiety (e.g., constantly telephoning family or physician, making unneeded doctor appointments or going to the emergency room), their apparent positive consequences (e.g., feeling better, getting attention), but longer-term negative consequences (e.g., physician won't return calls, friends avoid the client because of expressions of worry). Teach the client that his/her mood can be improved by increasing pleasant events and decreasing unpleasant events. Encourage the client to identify pleasant events that are desirable, but are not currently a part of his/her daily routine (see "Identify and Schedule Pleasant Activities" in the  Adult Psychotherapy Homework Planner, 2nd ed. by Beacher Bottoms). Develop a one-week daily schedule with the client that increases pleasant events and decreases unpleasant events, making sure to have at least one pleasant event every day. Help the client to understand his/her strengths and weaknesses in problem-solving, to view problems as challenges and not personal deficits, and to identify and define current problem(s) that will be the focus of treatment. Help the client to identify realistic goals to address problem(s) of concern and identify major obstacles in achieving goals. Encourage the client to implement chosen solutions to current life problem(s), self-monitor and evaluate solution implementation, and reward self for efforts. Explore the relationship of negative self-talk (e.g., "I'm  just a burden to everyone"; "Everyone would be better off if I were dead") and distorted beliefs (e.g., "There's no one like me living here"; "I'd rather never leave my room than have anyone see me in a wheelchair") to depressed mood (or assign "Negative Thoughts Trigger Negative Feelings" in the Adult Psychotherapy Homework Planner, 2nd ed. by Beacher Bottoms; or "Daily Record of Dysfunctional Thoughts" in Cognitive Therapy of Depression by Bolling Bushy and Roselle Conner). Encourage the client to distinguish between all-or-nothing thinking (e.g., "Life in a nursing home can't be worth living") and genuine nuanced reflection on life's meaning and purpose (e.g., "What's giving my life meaning at this stage?"); confront the former, encourage the latter. Diagnosis:Generalized anxiety disorder  Severe episode of recurrent major depressive disorder, without psychotic features (HCC)  Grief  Adjustment insomnia  Plan:  -meet again on Friday, Apr 04, 2024 at 8am in person

## 2024-03-21 ENCOUNTER — Encounter: Payer: Self-pay | Admitting: Physical Therapy

## 2024-03-21 ENCOUNTER — Ambulatory Visit: Admitting: Physical Therapy

## 2024-03-21 DIAGNOSIS — M25551 Pain in right hip: Secondary | ICD-10-CM

## 2024-03-21 DIAGNOSIS — M6281 Muscle weakness (generalized): Secondary | ICD-10-CM

## 2024-03-21 NOTE — Therapy (Signed)
 OUTPATIENT PHYSICAL THERAPY LOWER EXTREMITY TREATMENT   Patient Name: Heather Thompson MRN: [id number] DOB:1951/05/17, 73 y.o., female Today's Date: 03/21/2024  END OF SESSION:  PT End of Session - 03/21/24 1150     Visit Number 21    Number of Visits 32    Date for PT Re-Evaluation 05/07/24    Authorization Type Medicare    Authorization - Visit Number 21    Progress Note Due on Visit 30    PT Start Time 1100    PT Stop Time 1149    PT Time Calculation (min) 49 min    Activity Tolerance Patient tolerated treatment well    Behavior During Therapy WFL for tasks assessed/performed                                 Past Medical History:  Diagnosis Date   Abnormality of gait 04/23/2019   Acquired hypothyroidism 07/29/2021   Allergic rhinitis due to animal hair and dander 12/02/2015   Formatting of this note might be different from the original.  [facility]  Seen by Dr. Travis Friedman 11/11/15     Plan- Received allergy shots today. Follow up 1 year.     Allergy    Anemia    Anemia    Anxiety    Arthritis of scaphoid-trapezium-trapezoid joint of right hand 09/10/2023   B12 deficiency    Back pain    Chronic fatigue 07/29/2021   Chronic fatigue syndrome    Chronic throat clearing 03/14/2023   Clotting disorder (HCC)    Constipation    Depression with anxiety 04/07/2009   Formatting of this note might be different from the original.  well controlled on current meds, follows with psychiatrist in [place]. Had a   h/o severe depression 5 years ago, and had ECT.     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  B12 deficiency   Iron deficiency      Herniated lumbar intervertebral disc 11/12/2020   High cholesterol    History of blood clots    History of cervical spinal surgery 01/01/2018   Formatting of this note might be different from the original.  Fusion C6 and C7 per 2018 MRI report.     History of left hip replacement 01/14/2018   History of Roux-en-Y gastric bypass    Hoarseness 03/14/2023   Hyperlipidemia 08/19/2010   Hypertension    Hyponatremia 01/17/2018   Hypothyroidism    Hypothyroidism    IBS (irritable bowel syndrome) 04/06/2021   Impaired fasting glucose 08/19/2010   Incontinence 10/30/2013   Formatting of this note might be different from the original. Formatting of this note might be different from the original. Urinary Incontinence     Infertility, female    Insomnia 04/07/2009   Formatting of this note might be different from the original.  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  Followed by [facility]  Seen by Dr. Nyle Belling 03/01/15     S/p changing thoracic spinal cord stimulator generator 08/18/14.     Lumbar spine films show spinal cord generator in left flank area. 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  Follows with Rheumatology in [facility] of this note might be different from the original.  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  Getting iron infusion via port at hematalogy/oncology center at [facility].   Was recently hospitalized in [facility] for severe anemia     Formatting of this note might be different from the original.  on OTC b12 currently     Vitamin D  deficiency    Past Surgical History:  Procedure Laterality Date   ABDOMINAL HYSTERECTOMY     APPENDECTOMY     CARPAL TUNNEL RELEASE Bilateral 2023   with thumb  joint replacement   CERVICAL FUSION     CHOLECYSTECTOMY     FOOT SURGERY Bilateral    fusion   GASTRIC BYPASS     IR RADIOLOGIST EVAL & MGMT  03/14/2024   SPINAL CORD STIMULATOR INSERTION  07/2023   TOTAL HIP ARTHROPLASTY Bilateral    11/2016 & 05/2017   Patient Active Problem List   Diagnosis Date Noted   Palpitations 10/04/2023   Cardiac murmur 10/04/2023   Allergy    Anemia    Anxiety    B12 deficiency    Back pain    Chronic fatigue syndrome    Clotting  disorder (HCC)    Constipation    Edema of both lower extremities    Fatty liver    Fibromyalgia    Gallbladder problem    High cholesterol    History of blood clots    History of Roux-en-Y gastric bypass    Hypothyroidism    Infertility, female    Joint pain    Kidney stones    Muscle disorder    Osteoarthritis    Prediabetes    Salzmann nodular degeneration    SOBOE (shortness of breath on exertion)    Swallowing difficulty    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   Arthritis of scaphoid-trapezium-trapezoid joint of right hand 09/10/2023   Urge incontinence 08/28/2023   OAB (overactive bladder) 08/28/2023   Lumbar post-laminectomy syndrome 07/06/2023   Snapping hip syndrome, 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   Other myositis, left thigh 04/24/2023   Sacroiliitis, not elsewhere classified (HCC) 04/24/2023   Dysphagia 04/17/2023   Elevated liver enzymes 04/02/2023   Elevated TSH 04/02/2023   Chronic throat clearing 03/14/2023   Globus sensation 03/14/2023   Hoarseness 03/14/2023   Muscle tension dysphonia 03/12/2023   Unspecified inflammatory spondylopathy, 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   Primary osteoarthritis of both knees 12/21/2021   Port-A-Cath in place 07/29/2021   Acquired hypothyroidism 07/29/2021   Chronic pain syndrome 07/29/2021   Vitamin B12 deficiency 07/29/2021   Vitamin D  deficiency 07/29/2021   Postmenopausal estrogen deficiency 07/29/2021   IBS (irritable bowel syndrome) 04/06/2021    Retention of urine 04/06/2021   Herniated lumbar intervertebral disc 11/12/2020   Skin sensation disturbance 11/12/2020   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   Spondylopathy, unspecified 11/12/2020   Trochanteric bursitis of both hips 11/19/2019   Low serum iron 04/23/2019   Osteomalacia 04/23/2019   Abnormality of gait 04/23/2019   History of bilateral hip arthroplasty 06/26/2018   Gastroesophageal reflux disease 06/18/2018   Hypertension 06/18/2018   Intertrigo 04/30/2018   Lentigines 04/30/2018   Hyponatremia 01/17/2018   History of cervical spinal surgery 01/01/2018   Obesity (BMI 30-39.9) 01/01/2018   SK (seborrheic keratosis) 04/18/2017   Pain management contract agreement 11/17/2016   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   Allergic rhinitis due to animal hair and dander 12/02/2015   Low back pain 03/03/2015   Incontinence 10/30/2013   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   Neutrophilic leukocytosis 08/12/2012   Thrombocytosis 08/12/2012   Osteoporosis, senile 11/15/2010   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   Hyperlipidemia 08/19/2010   Impaired fasting glucose 08/19/2010   Polyarthritis 09/28/2009   Degenerative disc disease 04/07/2009   Depression with anxiety 04/07/2009   Myopathy 04/07/2009   Insomnia 04/07/2009   Migraine 04/07/2009   Scoliosis 04/07/2009   Seasonal allergies 04/07/2009   Vitamin B12 deficiency anemia 04/07/2009    PCP: Cherre Cornish  REFERRING PROVIDER: Carlye Child DIAG: bilateral trochanteric bursitis, vertigo  THERAPY DIAG:  Bilateral hip pain  Muscle weakness (generalized)  Rationale for Evaluation and Treatment: Rehabilitation  ONSET DATE: 04/2023  SUBJECTIVE:   SUBJECTIVE STATEMENT: Pt states no change since last visit. Still having difficulty lifting Rt hip, pain with getting in/out of car. She is going to her grandson's graduation in [place]  next week.  PERTINENT HISTORY: Bilat THA 2018, fibromyalgia, OA, spinal cord  stimulator, bilat TC joint fusion  Pt had bilateral hip replacements in 2018. Pt began having Lt hip and groin pain last summer that was so severe she went to ED. She was eventually diagnosed with bursitis. Since that time the pain has  continued and is "always there". She now has bilateral hip pain, groin pain. Hips are tender to touch. Pain increases with laying on her sides, pain with sit to stand, prolonged standing and walking. Pt has not found a way to relieve pain  Vestibular eval: pt states dizziness has been going on for "quite a while". Pt states she feels "room is spinning" when she goes from sit to stand. She reports she also feels that sensation when she goes from supine to sit but it is not as severe. She also states she veers to the right while walking. She also states that if she is standing still she will occasionally feel like she is "tipping over" to the right. The sensation only lasts a few seconds. Pt states she has had a cardiac work up which ruled out cardiac cause of dizziness PAIN:  Are you having pain? Yes: NPRS scale: 6/10 currently, 8-9/10 at worst Pain location: bilat hips Lt > Rt Pain description: sore, tender Aggravating factors: sitting in recliner, laying on sides, sit to stand Relieving factors: unknown  PRECAUTIONS: pt has spinal cord stimulator  RED FLAGS: None   WEIGHT BEARING RESTRICTIONS: No  FALLS:  Has patient fallen in last 6 months? No  OCCUPATION: retired  PLOF: Independent  PATIENT GOALS: reduce pain  NEXT MD VISIT: February 2025  OBJECTIVE:  Note: Objective measures were completed at Evaluation unless otherwise noted.  DIAGNOSTIC FINDINGS: x ray performed but not read at time as eval  PATIENT SURVEYS:  FOTO 39   MUSCLE LENGTH: Hamstrings: decreased bilat Hip flexors: decreased bilat   PALPATION: TTP bilat hip flexors, ITB, glutes, HS Increased mm spasticity bilat hamstrings, glutes, quads  LOWER EXTREMITY ROM:  Active ROM  Right eval Left eval  Hip flexion    Hip extension    Hip abduction    Hip adduction    Hip internal rotation    Hip external rotation 30 50  Knee flexion    Knee extension    Ankle dorsiflexion    Ankle plantarflexion    Ankle inversion    Ankle eversion     (Blank rows = not tested)  LOWER EXTREMITY MMT:  MMT Right eval Left eval Right 4/23 Left 4/23 Right 4/25  Hip flexion 3+ 3 3- 4+   Hip extension 3 3 3 4    Hip abduction 3 3 3- 4   Hip adduction       Hip internal rotation       Hip external rotation 3 3+ 3 4   Knee flexion     3  Knee extension     3-  Ankle dorsiflexion       Ankle plantarflexion     3  Ankle inversion       Ankle eversion        (Blank rows = not tested)   FUNCTIONAL TESTS:  5 times sit to stand: 18.85 seconds  VESTIBULAR ASSESSMENT:     SYMPTOM BEHAVIOR:  Subjective history: pt states dizziness has been going on for "quite a while". Pt states she feels "room is spinning" when she goes from sit to stand. She reports she also feels that sensation when she goes from supine to sit but it is not as severe. She also states she veers to the right while walking. She also states that if she is standing still she will occasionally feel like she is "tipping over" to the right. The sensation only lasts a few seconds.  Non-Vestibular  symptoms:  none reported  Type of dizziness: Spinning/Vertigo  Frequency: multiple times a day  Duration: seconds  Aggravating factors: Induced by position change: supine to sit, sit to stand, and walking  Relieving factors: slow movements  Progression of symptoms: unchanged  OCULOMOTOR EXAM:  Ocular Alignment: normal  Ocular ROM: No Limitations  Spontaneous Nystagmus: absent  Gaze-Induced Nystagmus: age appropriate nystagmus at end range  Smooth Pursuits: saccades and pt reports "spinning" sensation when focusing on pen  Saccades: intact     VESTIBULAR - OCULAR REFLEX:   Slow VOR: Positive Bilaterally  increased spinning sensation  VOR Cancellation: Comment: increased spinning sensation  Head-Impulse Test: HIT Right: positive HIT Left: negative     POSITIONAL TESTING: Right Dix-Hallpike: no nystagmus Left Dix-Hallpike: no nystagmus Right Roll Test: no nystagmus Left Roll Test: no nystagmus   MOTION SENSITIVITY:    Motion Sensitivity Quotient Intensity: 0 = none, 1 = Lightheaded, 2 = Mild, 3 = Moderate, 4 = Severe, 5 = Vomiting  Intensity  1. Sitting to supine   2. Supine to L side   3. Supine to R side   4. Supine to sitting   5. L Hallpike-Dix   6. Up from L    7. R Hallpike-Dix   8. Up from R    9. Sitting, head  tipped to L knee   10. Head up from L  knee   11. Sitting, head  tipped to R knee   12. Head up from R  knee   13. Sitting head turns x5   14.Sitting head nods x5   15. In stance, 180  turn to L    16. In stance, 180  turn to R      [facility] Adult PT Treatment:                                                DATE: 03/21/24  Manual Therapy: STM and TPR Rt hip flexors, adductors Neuromuscular re-ed: Standing march with green TB pull down Standing row green TB in tandem stance Antirotation red TB with side step Tandem stance without UE support up to 10 sec bilat Therapeutic Activity: Continued education on rationale for treatment and progress throughout course of PT  [facility] Adult PT Treatment:                                                DATE: 03/19/24 Therapeutic Exercise/Activity: Strength measurements (see above) Hip ER/IR isometrics in modified butterfly position (Rt) Review of progress towards goals and remaining goals Review of updated HEP Manual Therapy: STM and TPR Rt hip flexors, adductors   [facility] Adult PT Treatment:                                                DATE: 03/13/24 Therapeutic Exercise/Activity/NMR: Hip ER/IR isometrics in modified butterfly position (Rt) Hip add isometric green bolster 10 x 5 sec hold Hip abd isometric 10 x 5  sec hold Seated hip flexion with red TB resistance 2 x 10 Manual Therapy: STM and TPR Rt hip flexors, adductors  [facility] Adult PT Treatment:                                                DATE: 03/11/24 Therapeutic Exercise/Activity/NMR: 5xSTS : 23.11 seconds DGI: 21/24 Seated hip flexion with red TB resistance 2 x 10 Hip flexion abd/add over dumbell to simulate in /out of car Isometrics hip ER/IR and hip add/abd in hip flexed position Seated hip flexor stretch Reclined position butterfly stretch for Rt hip adductors as tolerated Manual Therapy: STM and TPR as tolerated Rt hip adductors and hip flexors                                                                                                        PATIENT EDUCATION:  Education details: PT POC and goals, HEP Person educated: Patient Education method: Programmer, multimedia, Facilities manager, and Handouts Education comprehension: verbalized understanding and returned demonstration  HOME EXERCISE PROGRAM: Access Code: [email or web address] URL: [email or web address] Date: 03/19/2024 Prepared by: Lowery Rue  Exercises - Sit to Stand  - 1 x daily - 7 x weekly - 2 sets - 10 reps - Seated Hip Flexor Stretch  - 1 x daily - 7 x weekly - 1 sets - 3 reps - 30 seconds hold - Resistance Pulldown with March  - 1 x daily - 7 x weekly - 3 sets - 10 reps - Standing Bilateral Low Shoulder Row with Anchored Resistance  - 1 x daily - 7 x weekly - 3 sets - 10 reps - Standing Isometric Hip Flexion with Ball at [employer]  - 1 x daily - 7 x weekly - 2 sets - 10 reps - Standing Isometric Hip Abduction with Ball on Wall  - 1 x daily - 7 x weekly - 2 sets - 10 reps - Seated Hip Adduction Isometrics with Ball  - 1 x daily - 7 x weekly - 2 sets - 10 reps - Supine Hip Adductor Stretch  - 1 x daily - 7 x weekly - 1 sets - 3 reps - 30 seconds hold - standing marching with head turns  - 1 x daily - 7 x weekly - 3 sets - 10 reps  ASSESSMENT:  CLINICAL  IMPRESSION: Continued manual work due to pt reporting decreased pain after manual work each session. Focused balance today to improve pt tolerance to exercise. Pt able to tolerate balance exercises during session. Continued to educate pt on rationale for treatment and continued goals of care. She continues to be limited by pain but is able to tolerate light activity during session  OBJECTIVE IMPAIRMENTS: decreased activity tolerance, decreased mobility, difficulty walking, decreased ROM, decreased strength, increased muscle spasms, impaired flexibility, and pain.     GOALS: Goals reviewed with patient? Yes  SHORT TERM GOALS: Target date: 01/09/2024   Pt will be independent with initial HEP Baseline: Goal status: MET  2.  Pt will improve FOTO to > = 45 to demo improving functional mobility  Baseline: 33 on 01/11/24 Goal status: IN PROGRESS   LONG TERM GOALS: Target date: 04/30/2024    Pt will be independent with advanced HEP and plan for continued exercise in community Baseline:  Goal status: INITIAL  2.  Pt will improve FOTO to >= 50 to demo improved functional mobility Baseline:  Goal status: IN PROGRESS  3.  Pt will improve hip strength to 4+/5 bilat to improve standing and walking tolerance Baseline: see above Goal status: IN PROGRESS  4.  Pt will tolerate laying on both sides with pain <= 2/10 Baseline:  Goal status: MET  5.  Pt will improve 5 x STS to <= 15 seconds Baseline:  Goal status: IN PROGRESS - 23.11 seconds on 03/11/24  6.  Pt will report transitioning sit to stand without dizziness symptoms 75% of the time Baseline:  Goal status: MET 7.  Pt will demo decreased risk of falls with DGI >= 21/24 Baseline: 18/24 Goal status: MET - 21/24 on 03/11/24  8.  Pt will report baseline pain in hips 2-4/10 Baseline: 4-8/10 Goal status: INITIAL        PLAN:  PT FREQUENCY: 2x/week  PT DURATION: 6 weeks  PLANNED INTERVENTIONS: 97164- PT Re-evaluation,  97110-Therapeutic exercises, 97530- Therapeutic activity, 97112- Neuromuscular re-education, 97535- Self Care, 16109- Manual therapy, [id number]- Aquatic Therapy, 97035- Ultrasound, [id number]- Ionotophoresis 4mg /ml Dexamethasone , Patient/Family education, Balance training, Taping, Dry Needling, Cryotherapy, and Moist heat  PLAN FOR NEXT SESSION:  manual as indicated, hip stability, hip strengthening   Lanyiah Brix, PT 03/21/2024, 11:51 AM

## 2024-03-24 ENCOUNTER — Other Ambulatory Visit: Payer: Self-pay | Admitting: Medical-Surgical

## 2024-03-24 DIAGNOSIS — G4700 Insomnia, unspecified: Secondary | ICD-10-CM

## 2024-03-24 NOTE — Telephone Encounter (Signed)
 Last filled 11/09/2023  Last OV 02/13/2024

## 2024-03-25 ENCOUNTER — Ambulatory Visit: Admitting: Physical Therapy

## 2024-03-25 ENCOUNTER — Other Ambulatory Visit

## 2024-03-25 ENCOUNTER — Encounter: Payer: Self-pay | Admitting: Physical Therapy

## 2024-03-25 DIAGNOSIS — M6281 Muscle weakness (generalized): Secondary | ICD-10-CM

## 2024-03-25 DIAGNOSIS — M25551 Pain in right hip: Secondary | ICD-10-CM

## 2024-03-25 NOTE — Therapy (Signed)
 OUTPATIENT PHYSICAL THERAPY LOWER EXTREMITY TREATMENT   Patient Name: Heather Thompson MRN: [id number] DOB:Apr 01, 1951, 73 y.o., female Today's Date: 03/25/2024  END OF SESSION:  PT End of Session - 03/25/24 1444     Visit Number 22    Number of Visits 32    Date for PT Re-Evaluation 05/07/24    Authorization Type Medicare    Authorization - Visit Number 22    Progress Note Due on Visit 30    PT Start Time 1400    PT Stop Time 1443    PT Time Calculation (min) 43 min    Activity Tolerance Patient tolerated treatment well    Behavior During Therapy WFL for tasks assessed/performed                                  Past Medical History:  Diagnosis Date   Abnormality of gait 04/23/2019   Acquired hypothyroidism 07/29/2021   Allergic rhinitis due to animal hair and dander 12/02/2015   Formatting of this note might be different from the original.  [facility]  Seen by Dr. Travis Friedman 11/11/15     Plan- Received allergy shots today. Follow up 1 year.     Allergy    Anemia    Anemia    Anxiety    Arthritis of scaphoid-trapezium-trapezoid joint of right hand 09/10/2023   B12 deficiency    Back pain    Chronic fatigue 07/29/2021   Chronic fatigue syndrome    Chronic throat clearing 03/14/2023   Clotting disorder (HCC)    Constipation    Depression with anxiety 04/07/2009   Formatting of this note might be different from the original.  well controlled on current meds, follows with psychiatrist in [place]. Had a   h/o severe depression 5 years ago, and had ECT.     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  B12 deficiency   Iron deficiency      Herniated lumbar intervertebral disc 11/12/2020   High cholesterol    History of blood clots    History of cervical spinal surgery 01/01/2018   Formatting of this note might be different from the original.  Fusion C6 and C7 per 2018 MRI report.     History of left hip replacement 01/14/2018   History of Roux-en-Y gastric bypass    Hoarseness 03/14/2023   Hyperlipidemia 08/19/2010   Hypertension    Hyponatremia 01/17/2018   Hypothyroidism    Hypothyroidism    IBS (irritable bowel syndrome) 04/06/2021   Impaired fasting glucose 08/19/2010   Incontinence 10/30/2013   Formatting of this note might be different from the original. Formatting of this note might be different from the original. Urinary Incontinence     Infertility, female    Insomnia 04/07/2009   Formatting of this note might be different from the original.  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  Followed by [facility]  Seen by Dr. Nyle Belling 03/01/15     S/p changing thoracic spinal cord stimulator generator 08/18/14.     Lumbar spine films show spinal cord generator in left flank area. 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  Follows with Rheumatology in [facility] of this note might be different from the original.  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  Getting iron infusion via port at hematalogy/oncology center at [facility].   Was recently hospitalized in [facility] for severe anemia     Formatting of this note might be different from the original.  on OTC b12 currently     Vitamin D  deficiency    Past Surgical History:  Procedure Laterality Date   ABDOMINAL HYSTERECTOMY     APPENDECTOMY     CARPAL TUNNEL RELEASE Bilateral 2023   with thumb  joint replacement   CERVICAL FUSION     CHOLECYSTECTOMY     FOOT SURGERY Bilateral    fusion   GASTRIC BYPASS     IR RADIOLOGIST EVAL & MGMT  03/14/2024   SPINAL CORD STIMULATOR INSERTION  07/2023   TOTAL HIP ARTHROPLASTY Bilateral    11/2016 & 05/2017   Patient Active Problem List   Diagnosis Date Noted   Palpitations 10/04/2023   Cardiac murmur 10/04/2023   Allergy    Anemia    Anxiety    B12 deficiency    Back pain    Chronic fatigue syndrome  Clotting disorder (HCC)    Constipation    Edema of both lower extremities    Fatty liver    Fibromyalgia    Gallbladder problem    High cholesterol    History of blood clots    History of Roux-en-Y gastric bypass    Hypothyroidism    Infertility, female    Joint pain    Kidney stones    Muscle disorder    Osteoarthritis    Prediabetes    Salzmann nodular degeneration    SOBOE (shortness of breath on exertion)    Swallowing difficulty    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   Arthritis of scaphoid-trapezium-trapezoid joint of right hand 09/10/2023   Urge incontinence 08/28/2023   OAB (overactive bladder) 08/28/2023   Lumbar post-laminectomy syndrome 07/06/2023   Snapping hip syndrome, 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   Other myositis, left thigh 04/24/2023   Sacroiliitis, not elsewhere classified (HCC) 04/24/2023   Dysphagia 04/17/2023   Elevated liver enzymes 04/02/2023   Elevated TSH 04/02/2023   Chronic throat clearing 03/14/2023   Globus sensation 03/14/2023   Hoarseness 03/14/2023   Muscle tension dysphonia 03/12/2023   Unspecified inflammatory spondylopathy, 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   Primary osteoarthritis of both knees 12/21/2021   Port-A-Cath in place 07/29/2021   Acquired hypothyroidism 07/29/2021   Chronic pain syndrome 07/29/2021   Vitamin B12 deficiency 07/29/2021   Vitamin D  deficiency 07/29/2021   Postmenopausal estrogen deficiency 07/29/2021   IBS (irritable bowel syndrome) 04/06/2021    Retention of urine 04/06/2021   Herniated lumbar intervertebral disc 11/12/2020   Skin sensation disturbance 11/12/2020   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   Spondylopathy, unspecified 11/12/2020   Trochanteric bursitis of both hips 11/19/2019   Low serum iron 04/23/2019   Osteomalacia 04/23/2019   Abnormality of gait 04/23/2019   History of bilateral hip arthroplasty 06/26/2018   Gastroesophageal reflux disease 06/18/2018   Hypertension 06/18/2018   Intertrigo 04/30/2018   Lentigines 04/30/2018   Hyponatremia 01/17/2018   History of cervical spinal surgery 01/01/2018   Obesity (BMI 30-39.9) 01/01/2018   SK (seborrheic keratosis) 04/18/2017   Pain management contract agreement 11/17/2016   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   Allergic rhinitis due to animal hair and dander 12/02/2015   Low back pain 03/03/2015   Incontinence 10/30/2013   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   Neutrophilic leukocytosis 08/12/2012   Thrombocytosis 08/12/2012   Osteoporosis, senile 11/15/2010   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   Hyperlipidemia 08/19/2010   Impaired fasting glucose 08/19/2010   Polyarthritis 09/28/2009   Degenerative disc disease 04/07/2009   Depression with anxiety 04/07/2009   Myopathy 04/07/2009   Insomnia 04/07/2009   Migraine 04/07/2009   Scoliosis 04/07/2009   Seasonal allergies 04/07/2009   Vitamin B12 deficiency anemia 04/07/2009    PCP: Cherre Cornish  REFERRING PROVIDER: Carlye Child DIAG: bilateral trochanteric bursitis, vertigo  THERAPY DIAG:  Bilateral hip pain  Muscle weakness (generalized)  Rationale for Evaluation and Treatment: Rehabilitation  ONSET DATE: 04/2023  SUBJECTIVE:   SUBJECTIVE STATEMENT: Pt states she did a lot of shopping the past few days and she is having more back pain today. Her Rt knee was bothering her yesterday as well. She continues with back pain today limiting mobility  PERTINENT HISTORY: Bilat THA 2018, fibromyalgia, OA,  spinal cord stimulator, bilat TC joint fusion  Pt had bilateral hip replacements in 2018. Pt began having Lt hip and groin pain last summer that was so severe she went to ED. She was eventually diagnosed with  bursitis. Since that time the pain has continued and is "always there". She now has bilateral hip pain, groin pain. Hips are tender to touch. Pain increases with laying on her sides, pain with sit to stand, prolonged standing and walking. Pt has not found a way to relieve pain  Vestibular eval: pt states dizziness has been going on for "quite a while". Pt states she feels "room is spinning" when she goes from sit to stand. She reports she also feels that sensation when she goes from supine to sit but it is not as severe. She also states she veers to the right while walking. She also states that if she is standing still she will occasionally feel like she is "tipping over" to the right. The sensation only lasts a few seconds. Pt states she has had a cardiac work up which ruled out cardiac cause of dizziness PAIN:  Are you having pain? Yes: NPRS scale: 6/10 currently, 8-9/10 at worst Pain location: bilat hips Lt > Rt Pain description: sore, tender Aggravating factors: sitting in recliner, laying on sides, sit to stand Relieving factors: unknown  PRECAUTIONS: pt has spinal cord stimulator  RED FLAGS: None   WEIGHT BEARING RESTRICTIONS: No  FALLS:  Has patient fallen in last 6 months? No  OCCUPATION: retired  PLOF: Independent  PATIENT GOALS: reduce pain  NEXT MD VISIT: February 2025  OBJECTIVE:  Note: Objective measures were completed at Evaluation unless otherwise noted.  DIAGNOSTIC FINDINGS: x ray performed but not read at time as eval  PATIENT SURVEYS:  FOTO 39   MUSCLE LENGTH: Hamstrings: decreased bilat Hip flexors: decreased bilat   PALPATION: TTP bilat hip flexors, ITB, glutes, HS Increased mm spasticity bilat hamstrings, glutes, quads  LOWER EXTREMITY  ROM:  Active ROM Right eval Left eval  Hip flexion    Hip extension    Hip abduction    Hip adduction    Hip internal rotation    Hip external rotation 30 50  Knee flexion    Knee extension    Ankle dorsiflexion    Ankle plantarflexion    Ankle inversion    Ankle eversion     (Blank rows = not tested)  LOWER EXTREMITY MMT:  MMT Right eval Left eval Right 4/23 Left 4/23 Right 4/25  Hip flexion 3+ 3 3- 4+   Hip extension 3 3 3 4    Hip abduction 3 3 3- 4   Hip adduction       Hip internal rotation       Hip external rotation 3 3+ 3 4   Knee flexion     3  Knee extension     3-  Ankle dorsiflexion       Ankle plantarflexion     3  Ankle inversion       Ankle eversion        (Blank rows = not tested)   FUNCTIONAL TESTS:  5 times sit to stand: 18.85 seconds  VESTIBULAR ASSESSMENT:     SYMPTOM BEHAVIOR:  Subjective history: pt states dizziness has been going on for "quite a while". Pt states she feels "room is spinning" when she goes from sit to stand. She reports she also feels that sensation when she goes from supine to sit but it is not as severe. She also states she veers to the right while walking. She also states that if she is standing still she will occasionally feel like she is "tipping over" to the right. The sensation  only lasts a few seconds.  Non-Vestibular symptoms:  none reported  Type of dizziness: Spinning/Vertigo  Frequency: multiple times a day  Duration: seconds  Aggravating factors: Induced by position change: supine to sit, sit to stand, and walking  Relieving factors: slow movements  Progression of symptoms: unchanged  OCULOMOTOR EXAM:  Ocular Alignment: normal  Ocular ROM: No Limitations  Spontaneous Nystagmus: absent  Gaze-Induced Nystagmus: age appropriate nystagmus at end range  Smooth Pursuits: saccades and pt reports "spinning" sensation when focusing on pen  Saccades: intact     VESTIBULAR - OCULAR REFLEX:   Slow VOR: Positive  Bilaterally increased spinning sensation  VOR Cancellation: Comment: increased spinning sensation  Head-Impulse Test: HIT Right: positive HIT Left: negative     POSITIONAL TESTING: Right Dix-Hallpike: no nystagmus Left Dix-Hallpike: no nystagmus Right Roll Test: no nystagmus Left Roll Test: no nystagmus   MOTION SENSITIVITY:    Motion Sensitivity Quotient Intensity: 0 = none, 1 = Lightheaded, 2 = Mild, 3 = Moderate, 4 = Severe, 5 = Vomiting  Intensity  1. Sitting to supine   2. Supine to L side   3. Supine to R side   4. Supine to sitting   5. L Hallpike-Dix   6. Up from L    7. R Hallpike-Dix   8. Up from R    9. Sitting, head  tipped to L knee   10. Head up from L  knee   11. Sitting, head  tipped to R knee   12. Head up from R  knee   13. Sitting head turns x5   14.Sitting head nods x5   15. In stance, 180  turn to L    16. In stance, 180  turn to R      [facility] Adult PT Treatment:                                                DATE: 03/25/24 Therapeutic Exercise/Activity: Seated trunk rotation to address c/o back pain Physioball roll outs all directions Hip ER/IR after manual work [employer] set 2 x 10 Hip add isometric 10 x 3 sec hold Hip flexion seated with red TB 2 x 6 Manual Therapy: STM and TPR Rt hip flexors, adductors    [facility] Adult PT Treatment:                                                DATE: 03/21/24  Manual Therapy: STM and TPR Rt hip flexors, adductors Neuromuscular re-ed: Standing march with green TB pull down Standing row green TB in tandem stance Antirotation red TB with side step Tandem stance without UE support up to 10 sec bilat Therapeutic Activity: Continued education on rationale for treatment and progress throughout course of PT  [facility] Adult PT Treatment:                                                DATE: 03/19/24 Therapeutic Exercise/Activity: Strength measurements (see above) Hip ER/IR isometrics in modified butterfly position  (Rt) Review of progress towards goals and remaining goals Review  of updated HEP Manual Therapy: STM and TPR Rt hip flexors, adductors   [facility] Adult PT Treatment:                                                DATE: 03/13/24 Therapeutic Exercise/Activity/NMR: Hip ER/IR isometrics in modified butterfly position (Rt) Hip add isometric green bolster 10 x 5 sec hold Hip abd isometric 10 x 5 sec hold Seated hip flexion with red TB resistance 2 x 10 Manual Therapy: STM and TPR Rt hip flexors, adductors                                                                                                        PATIENT EDUCATION:  Education details: PT POC and goals, HEP Person educated: Patient Education method: Explanation, Demonstration, and Handouts Education comprehension: verbalized understanding and returned demonstration  HOME EXERCISE PROGRAM: Access Code: [email or web address] URL: [email or web address] Date: 03/19/2024 Prepared by: Lowery Rue  Exercises - Sit to Stand  - 1 x daily - 7 x weekly - 2 sets - 10 reps - Seated Hip Flexor Stretch  - 1 x daily - 7 x weekly - 1 sets - 3 reps - 30 seconds hold - Resistance Pulldown with March  - 1 x daily - 7 x weekly - 3 sets - 10 reps - Standing Bilateral Low Shoulder Row with Anchored Resistance  - 1 x daily - 7 x weekly - 3 sets - 10 reps - Standing Isometric Hip Flexion with Ball at [employer]  - 1 x daily - 7 x weekly - 2 sets - 10 reps - Standing Isometric Hip Abduction with Ball on Wall  - 1 x daily - 7 x weekly - 2 sets - 10 reps - Seated Hip Adduction Isometrics with Ball  - 1 x daily - 7 x weekly - 2 sets - 10 reps - Supine Hip Adductor Stretch  - 1 x daily - 7 x weekly - 1 sets - 3 reps - 30 seconds hold - standing marching with head turns  - 1 x daily - 7 x weekly - 3 sets - 10 reps  ASSESSMENT:  CLINICAL IMPRESSION: Pt continues to be limited by pain . She does get some relief after manual work during session but pain  returns the next day. She has knee procedure next week and will return the week after  OBJECTIVE IMPAIRMENTS: decreased activity tolerance, decreased mobility, difficulty walking, decreased ROM, decreased strength, increased muscle spasms, impaired flexibility, and pain.     GOALS: Goals reviewed with patient? Yes  SHORT TERM GOALS: Target date: 01/09/2024   Pt will be independent with initial HEP Baseline: Goal status: MET  2.  Pt will improve FOTO to > = 45 to demo improving functional mobility Baseline: 33 on 01/11/24 Goal status: IN PROGRESS   LONG TERM GOALS: Target date: 04/30/2024    Pt will be independent with advanced HEP and plan for  continued exercise in community Baseline:  Goal status: INITIAL  2.  Pt will improve FOTO to >= 50 to demo improved functional mobility Baseline:  Goal status: IN PROGRESS  3.  Pt will improve hip strength to 4+/5 bilat to improve standing and walking tolerance Baseline: see above Goal status: IN PROGRESS  4.  Pt will tolerate laying on both sides with pain <= 2/10 Baseline:  Goal status: MET  5.  Pt will improve 5 x STS to <= 15 seconds Baseline:  Goal status: IN PROGRESS - 23.11 seconds on 03/11/24  6.  Pt will report transitioning sit to stand without dizziness symptoms 75% of the time Baseline:  Goal status: MET 7.  Pt will demo decreased risk of falls with DGI >= 21/24 Baseline: 18/24 Goal status: MET - 21/24 on 03/11/24  8.  Pt will report baseline pain in hips 2-4/10 Baseline: 4-8/10 Goal status: INITIAL        PLAN:  PT FREQUENCY: 2x/week  PT DURATION: 6 weeks  PLANNED INTERVENTIONS: 97164- PT Re-evaluation, 97110-Therapeutic exercises, 97530- Therapeutic activity, [id number]- Neuromuscular re-education, 97535- Self Care, 09811- Manual therapy, [id number]- Aquatic Therapy, 97035- Ultrasound, 91478- Ionotophoresis 4mg /ml Dexamethasone , Patient/Family education, Balance training, Taping, Dry Needling, Cryotherapy, and  Moist heat  PLAN FOR NEXT SESSION:  manual as indicated, hip stability, hip strengthening   Everhett Bozard, PT 03/25/2024, 2:45 PM

## 2024-03-26 ENCOUNTER — Other Ambulatory Visit (HOSPITAL_COMMUNITY): Payer: Self-pay | Admitting: Psychiatry

## 2024-03-26 DIAGNOSIS — F322 Major depressive disorder, single episode, severe without psychotic features: Secondary | ICD-10-CM

## 2024-03-31 ENCOUNTER — Telehealth: Payer: Self-pay

## 2024-03-31 MED ORDER — METHYLPREDNISOLONE 4 MG PO TBPK: ORAL_TABLET | ORAL | 0 refills | Status: DC

## 2024-03-31 NOTE — Discharge Instructions (Signed)
 Discharge Instructions for Genicular Artery Embolization (GAE)   Post-Procedure Care   Activity:   Rest for the remainder of the day.   Avoid strenuous activities and heavy lifting for 48 hours.   Gradually resume normal activities as tolerated.   Pain Management:   You may experience mild pain or discomfort at the catheter insertion site or in the knee. This is normal.   Use over-the-counter pain relievers such as acetaminophen (Tylenol) or ibuprofen (Advil) as directed.   Apply an ice pack to the knee for 15-20 minutes every 2-3 hours to reduce swelling and discomfort.   Take Sol-Medrol Pack as directed per pharmacy.   Wound Care:   Keep the catheter insertion site clean and dry.   Remove the bandage after 24 hours and replace it with a clean, dry bandage if needed.   Avoid soaking in baths, hot tubs, or swimming pools for 5 days. Showers are allowed.   Medications:   Take prescribed medications as directed.   If you were taking blood thinners, follow your physician's instructions on when to resume them.   Diet:   Resume your normal diet.   Drink plenty of fluids to stay hydrated.   Follow-Up:   Schedule a follow-up appointment with your physician as instructed.   Contact your physician if you experience increased pain, swelling, redness, or drainage at the insertion site, or if you have a fever over 100.33F (38C).   When to Seek Immediate Medical Attention   Call [phone] with any concerns:   Signs of infection at the catheter site (redness, warmth, pus).   Sudden weakness or numbness in the leg.   If you need to speak to someone after hours 5:00pm, please call the on call IR MD at [phone]. Tell them you are a patient of Dr. Archer Asa and you had a GAE today along with any issues you are having.      Call 911 if:   Difficulty breathing or chest pain.   You have severe pain in your abdomen, and it does not get better with medicine.      You have leg pain or leg swelling.   You feel dizzy, or you faint.   Do not wait to see if the symptoms will go away.   Do not drive yourself to the hospital.   Please ensure you follow these instructions carefully and reach out to your healthcare provider if you have any concerns or questions. Wishing you a smooth and speedy recovery!

## 2024-04-01 ENCOUNTER — Ambulatory Visit: Attending: Sports Medicine | Admitting: Physical Therapy

## 2024-04-01 ENCOUNTER — Ambulatory Visit
Admission: RE | Admit: 2024-04-01 | Discharge: 2024-04-01 | Disposition: A | Discharge status: Home / Self Care | Source: Ambulatory Visit | Attending: Interventional Radiology | Admitting: Interventional Radiology

## 2024-04-01 ENCOUNTER — Other Ambulatory Visit: Payer: Self-pay | Admitting: Interventional Radiology

## 2024-04-01 DIAGNOSIS — M25561 Pain in right knee: Secondary | ICD-10-CM

## 2024-04-01 DIAGNOSIS — M869 Osteomyelitis, unspecified: Secondary | ICD-10-CM

## 2024-04-01 HISTORY — PX: IR EMBO ARTERIAL NOT HEMORR HEMANG INC GUIDE ROADMAPPING: IMG5448

## 2024-04-01 MED ORDER — KETOROLAC TROMETHAMINE 30 MG/ML IJ SOLN
30.0000 mg | INTRAMUSCULAR | Status: AC
  Administered 2024-04-01: 30 mg via INTRAVENOUS

## 2024-04-01 MED ORDER — IODIXANOL 270 MG/ML IV SOLN
100.0000 mL | Freq: Once | INTRAVENOUS | Status: AC
  Administered 2024-04-01: 70 mL

## 2024-04-01 MED ORDER — NITROGLYCERIN 1 MG/10 ML FOR IR/CATH LAB
INTRA_ARTERIAL | Status: DC | PRN
  Administered 2024-04-01 (×4): 100 ug

## 2024-04-01 MED ORDER — SODIUM CHLORIDE 0.9 % IV SOLN: INTRAVENOUS | Status: DC

## 2024-04-01 MED ORDER — NITROGLYCERIN 1 MG/10 ML FOR IR/CATH LAB: 100.0000 ug | INTRA_ARTERIAL | Status: DC | PRN

## 2024-04-01 MED ORDER — FENTANYL CITRATE (PF) 100 MCG/2ML IJ SOLN
INTRAMUSCULAR | Status: DC | PRN
  Administered 2024-04-01 (×3): 25 ug via INTRAVENOUS

## 2024-04-01 MED ORDER — MIDAZOLAM HCL 2 MG/2ML IJ SOLN
INTRAMUSCULAR | Status: DC | PRN
  Administered 2024-04-01 (×3): .5 mg via INTRAVENOUS
  Administered 2024-04-01: 1 mg via INTRAVENOUS
  Administered 2024-04-01: .5 mg via INTRAVENOUS

## 2024-04-01 MED ORDER — MIDAZOLAM HCL 2 MG/2ML IJ SOLN: 1.0000 mg | INTRAMUSCULAR | Status: DC | PRN

## 2024-04-01 MED ORDER — DEXAMETHASONE SODIUM PHOSPHATE 10 MG/ML IJ SOLN
10.0000 mg | Freq: Once | INTRAMUSCULAR | Status: AC
  Administered 2024-04-01: 10 mg via INTRAVENOUS

## 2024-04-01 MED ORDER — FENTANYL CITRATE PF 50 MCG/ML IJ SOSY: 25.0000 ug | PREFILLED_SYRINGE | INTRAMUSCULAR | Status: DC | PRN

## 2024-04-01 MED ORDER — ACETAMINOPHEN 10 MG/ML IV SOLN
1000.0000 mg | Freq: Once | INTRAVENOUS | Status: AC
  Administered 2024-04-01: 1000 mg via INTRAVENOUS

## 2024-04-01 MED ORDER — LIDOCAINE HCL 1 % IJ SOLN
10.0000 mL | Freq: Once | INTRAMUSCULAR | Status: AC
  Administered 2024-04-01: 10 mL via INTRADERMAL

## 2024-04-01 MED ORDER — DIPHENHYDRAMINE HCL 50 MG/ML IJ SOLN
INTRAMUSCULAR | Status: DC | PRN
  Administered 2024-04-01: 50 mg via INTRAVENOUS

## 2024-04-01 NOTE — Progress Notes (Signed)
 Pt back in nursing recovery area. Pt still drowsy from procedure but will wake up when spoken to. Pt follows commands, talks in complete sentences and has no complaints at this time. Pt will remain in nurses station until discharged by Radiologist.

## 2024-04-01 NOTE — Progress Notes (Signed)
Pt back in nursing recovery area. Pt still drowsy from procedure but will wake up when spoken to. Pt follows commands, talks in complete sentences and has no complaints at this time. Pt will remain in nursing station until discharge.  ?

## 2024-04-02 ENCOUNTER — Telehealth: Payer: Self-pay

## 2024-04-04 ENCOUNTER — Encounter: Payer: Self-pay | Admitting: Professional

## 2024-04-04 ENCOUNTER — Ambulatory Visit: Attending: Sports Medicine | Admitting: Physical Therapy

## 2024-04-04 ENCOUNTER — Encounter: Payer: Self-pay | Admitting: Physical Therapy

## 2024-04-04 ENCOUNTER — Ambulatory Visit (INDEPENDENT_AMBULATORY_CARE_PROVIDER_SITE_OTHER): Admitting: Professional

## 2024-04-04 DIAGNOSIS — M25551 Pain in right hip: Secondary | ICD-10-CM | POA: Diagnosis present

## 2024-04-04 DIAGNOSIS — M25552 Pain in left hip: Secondary | ICD-10-CM | POA: Diagnosis present

## 2024-04-04 DIAGNOSIS — M6281 Muscle weakness (generalized): Secondary | ICD-10-CM | POA: Diagnosis present

## 2024-04-04 DIAGNOSIS — F411 Generalized anxiety disorder: Secondary | ICD-10-CM | POA: Diagnosis not present

## 2024-04-04 DIAGNOSIS — F332 Major depressive disorder, recurrent severe without psychotic features: Secondary | ICD-10-CM

## 2024-04-04 DIAGNOSIS — F4321 Adjustment disorder with depressed mood: Secondary | ICD-10-CM | POA: Diagnosis not present

## 2024-04-04 NOTE — Progress Notes (Unsigned)
 [facility] Counselor/Therapist Progress Note  Patient ID: Heather Thompson, MRN: [id number],    Date: 04/04/2024  Time Spent: 57 minutes [id number]  Treatment Type: Individual Therapy  Risk Assessment: Danger to Self:  No; pt commented on having pain medication that is lethal and upon further questioning she stated there is nothing that couple happen that would ever cause her to attempt suicide Self-injurious Behavior: No Danger to Others: No  Subjective: The patient arrived on time for her inperson appointment  Issues addressed: 1-[place] trip -grandsons' graduation from college -she and grandson had a moment together regarding grandfather's absence -grandson had a necklace he wore under his shirt containing his grandfather's ashes -pt felt disappointed and like a fifth wheel since she was not coupled up -her daughter had 9 other people to consider -was placed at the end of the row next to daughter's brother in law and his wife -had no one to talk to 2-trauma -how trauma shapes our perceptions -pt admits that she now sees how her thoughts impact how she engages -example of daughter suggesting she could live anywhere not meaning she did not want her 3-self-care -how to care for her needs first -how to place other people after herself 4-sense of purpose -learning what her purpose is now that she is alone and has a totally different life  Treatment Plan Problems: Financial Stress, Low Self-Esteem, Attention Deficit Disorder (ADD) - Adult Symptoms: Unable to concentrate or pay attention to things of low interest, even when those things are important to his/her life. Restless and fidgety; unable to be sedentary for more than a short time. Disorganized in most areas of his/her life. Starts many projects but rarely finishes any. Chronic low self-esteem. Tendency toward addictive behaviors. Indebtedness and overdue bills that exceed ability to meet monthly payments. A  long-term lack of discipline in money management that has led to excessive indebtedness. A pattern of impulsive spending that does not consider the eventual financial consequences. Inability to accept compliments. Makes self-disparaging remarks; sees self as unattractive, worthless, a loser, a burden, unimportant; takes blame easily. Difficulty in saying no to others; assumes not being liked by others. Fear of rejection by others, especially peer group. Lack of any goals for life and setting of inappropriately low goals for self. Inability to identify positive characteristics of self. Anxious and uncomfortable in social situations. Goals: Reduce impulsive actions while increasing concentration and focus on low-interest activities. Minimize ADD behavioral interference in daily life. Sustain attention and concentration for consistently longer periods of time. Achieve an inner strength to control personal impulses, cravings, and desires that directly or indirectly increase debt irresponsibly. Resolve financial crisis with a path to eliminate debt. Revise spending patterns to not exceed income. Gain a new sense of self-worth in which the substance of one's value is not attached to the capacity to do things or own things that cost money. Understand personal desires, insecurities, and anxieties that make overspending possible. Elevate self-esteem. Develop a consistent, positive self-image. Demonstrate improved self-esteem through more pride in appearance, more assertiveness, greater eye contact, and identification of positive traits in self-talk messages. Establish an inward sense of self-worth, confidence, and competence. Interact socially without undue distress or disability. Objectives target date for all objectives is 03/25/2025: Identify the current specific ADD behaviors that cause the most difficulty. List the negative consequences of the ADD problematic behavior. Learn and implement  organization and planning skills. Acknowledge procrastination and the need to reduce it. Learn and implement skills to reduce procrastination.  Learn and implement skills to reduce the disruptive influence of distractibility. Combine skills learned in therapy into a new daily approach to managing ADHD. Use cognitive and behavioral strategies to control the impulse to make unnecessary and unaffordable purchases. Report instances of successful control over impulse to spend on unnecessary expenses. Increase insight into the historical and current sources of low self-esteem. Decrease the frequency of negative self-descriptive statements and increase frequency of positive self-descriptive statements. Articulate a plan to be proactive in trying to get identified needs met. Form realistic, appropriate, and attainable goals for self in all areas of life. Take verbal responsibility for accomplishments without discounting. Identify and replace negative self-talk messages used to reinforce low self-esteem. Interventions: Assist the client in identifying the current specific behaviors that cause him/her the most difficulty functioning as part of identifying treatment targets (i.e., a functional analysis). Select situations in which the client will be increasingly challenged to apply his/her new strategies for managing ADHD, starting with situations highly likely to be successful. Teach the client meditational and self-control strategies (e.g., "stop, look, listen, and think") to delay the need for instant gratification and inhibit impulses to achieve more meaningful, longer-term goals. Teach the client to apply new problem-solving skills to planning as a first step in overcoming procrastination; for each plan, break it down into manageable time-limited steps to reduce the influence of distractibility. Assist the client in identifying positives and negatives of procrastinating toward the goal of engaging him/her  in staying focused. Teach the client to use timers or other cues to remind him/her to stop tasks before he/she gets distracted in an effort to reduce the time they may be distracted and off-task (see Mastering Your Adult ADHD: Therapist Guide by Loura Rower al.). Teach the client to break down tasks into meaningful smaller units that can be completed without being distracted based on their demonstrated attention span. Teach the client stimulus control techniques that use external structure (e.g., lists, reminders, files, daily rituals) to improve on-task behavior; remove distracting stimuli in the environment; encourage the client to reward himself/herself for successful focus and follow-through. Assess the client's typical attention span by having them do a few "boring" tasks (e.g., sorting bills, reading something uninteresting) to the point that they report distraction; use this as an approximate measure of their typical attention span. Teach the client organization and planning skills including the routine use of a calendar and daily task list. Teach the client problem-solving skills (i.e., identify problem, brainstorm all possible options, evaluate the pros and cons of each option, select best option, implement a course of action, and evaluate results) as an approach to planning; for each plan, break it down into manageable time-limited steps to reduce the influence of distractibility. Assign the client to make a list of negative consequences that he/she has experienced or that could result from a continuation of the problematic behavior; process the list (or assign "Impulsive Behavior Journal" in the Adult Psychotherapy Homework Planner by Beacher Bottoms). Help the client identify his/her distorted, negative beliefs about self and the world and replace these messages with more realistic, affirmative messages (or assign "Journal and Replace Self-Defeating Thoughts" in the Adult Psychotherapy Homework Planner by  [facility] or read What to Say When You Talk to Yourself by Helmstetter). Assist the client in becoming aware of how he/she expresses or acts out negative feelings about himself/herself. Help the client reframe his/her negative assessment of himself/herself. Help the client become aware of his/her fear of rejection and its connection with past rejection  or abandonment experiences; begin to contrast past experiences of pain with present experiences of acceptance and competence. Ask the client to list accomplishments; process the integration of these into his/her self-image. Help the client analyze his/her goals to make sure they are realistic and attainable. Assign the client to make a list of goals for various areas of life and a plan for steps toward goal attainment. Assist the client in identifying and verbalizing his/her needs, met and unmet. Reinforce with praise and encouragement all of the client's reports of resisting the urge to overspend. Urge the client to avoid all impulse buying by delaying every purchase until after 24 hours of thought and by buying only from a prewritten list of items to buy (consider assigning "Impulsive Behavior Journal" from the Adult Psychotherapy Homework Planner by Beacher Bottoms). Teach the client the cognitive strategy of asking self before each purchase: Is this purchase absolutely necessary? Can we afford this? Do we have the cash to pay for this without incurring any further debt? Role-play situations in which the client must resist external pressure to spend beyond what he/she can afford (e.g., friend's invitation to golf or go shopping, child's request for a toy), emphasizing being graciously assertive in refusing the request. Role-play situations in which the client must resist the inner temptation to spend beyond reasonable limits, emphasizing positive self-talk that compliments self for being disciplined. Objectives target date for all objectives is  03/13/2025: Identify the specific anxiety symptoms that are personally most disturbing or most contributing to impaired functioning. Learn and practice thought and behavioral control methods to minimize and control anxiety symptoms once they have begun. Identify specific thoughts that precipitate anxiety symptoms. Replace anxiety-producing thoughts with constructive thoughts. Verbalize an understanding of the general physical and cognitive manifestations of the causes for anxiety. Identify and clarify the patterns of anxiety precipitants and consequences. Identify whether the symptoms of depression seem to be primarily related to interpersonal relationships, stressful life events or circumstances, thoughts/beliefs, or behaviors. Verbalize an understanding of the general physical and psychological effects of depression. Replace depression-promoting thoughts with mood-elevating thoughts. Keep a daily record of mood rating from 1 to 10, noting associated behaviors, activities, events, people, and thoughts. Identify specific thoughts that precipitate depressive moods. Monitor and report depressive symptoms, completing self-report assessment on a periodic basis. Describe and evaluate significant past and current interpersonal relationships. Tell memories of the deceased person, starting with positive memories. Consider possible ways of increasing involvement with others. Accept the reality of loss and tolerate the pain of intense grief. Engage in events and activities that increase level of social involvements. Identify and monitor specific pain triggers. Learn and implement somatic skills such as relaxation and/or biofeedback to reduce pain level. Learn mental coping skills and implement with somatic skills for managing acute pain. Interventions: Reinforce the client's belief system and/or refer to a spiritual leader who can support the client's faith. Explore and problem-solve with the client  difficulties, fears, or barriers related to increasing social involvement. Point out to the client that engagement with others is associated with decreased feelings of loneliness, grief, depression, and hopelessness. Provide encouragement and support to the client for increased engagement in contact with others. Clarify with the client that expression of negative thoughts or feelings about the deceased is not disloyal. Assist the client in remembering the loved one in realistic ways (i.e., includes attractions, positive traits, negative traits, conflicts, ambivalence, dependency, etc.), a memory that is neither idealized nor demonized, so that the client's relationship with the  deceased person can be appraised realistically (or assign "Dear _____: A Letter to a Lost Loved One" in the Adult Psychotherapy Homework Planner, 2nd ed. by Beacher Bottoms). Assign the client to read about progressive muscle relaxation and other calming strategies in relevant books or treatment manuals (e.g., Progressive Relaxation Training by Juleen Oakland). Assign a homework exercise in which the client implements somatic pain management skills and records the result; review and process during the treatment session. Teach the client distraction techniques (e.g., pleasant imagery, counting techniques, alternative focal points) and how to use them for relaxation skills for the management of acute episodes of pain. Ask the client to create a list of activities that are pleasurable to him/her (or assign "Identify and Schedule Pleasant Activities" in the Adult Psychotherapy Homework Planner, 2nd ed. by Beacher Bottoms); process the list, developing a plan of increasing the frequency of engaging in the selected pleasurable activities. Teach the client problem-solving skills to apply to removal of obstacles to implementing new skills; role-play implementation of these skills to obstacles. Assist the client in identifying the most effective  personal stress management techniques (e.g., prayer, walking, baking, telephoning a friend), and encourage daily scheduling of these activities (or assign "Past Successful Anxiety Coping" in the Adult Psychotherapy Homework Planner, 2nd ed. by Beacher Bottoms). Teach the client the concept of finding a good match between the individual's capacities and the demands of the physical environment. If the physical environment is too demanding for the individual's capacities (e.g., frail older adult taking care of a large house), the individual can become overwhelmed and may need to move from large home to smaller accommodations or residence for older adults). While reviewing the client's anxiety symptom chart, help him/her recognize patterns associated with anxiety symptoms: sort out precipitants from consequences, identify the most intense or frequent precipitants, and identify the consequences that help to perpetuate maladaptive patterns. Assist the client in identifying his/her current attempts to reducing anxiety (e.g., constantly telephoning family or physician, making unneeded doctor appointments or going to the emergency room), their apparent positive consequences (e.g., feeling better, getting attention), but longer-term negative consequences (e.g., physician won't return calls, friends avoid the client because of expressions of worry). Teach the client that his/her mood can be improved by increasing pleasant events and decreasing unpleasant events. Encourage the client to identify pleasant events that are desirable, but are not currently a part of his/her daily routine (see "Identify and Schedule Pleasant Activities" in the Adult Psychotherapy Homework Planner, 2nd ed. by Beacher Bottoms). Develop a one-week daily schedule with the client that increases pleasant events and decreases unpleasant events, making sure to have at least one pleasant event every day. Help the client to understand his/her strengths and weaknesses  in problem-solving, to view problems as challenges and not personal deficits, and to identify and define current problem(s) that will be the focus of treatment. Help the client to identify realistic goals to address problem(s) of concern and identify major obstacles in achieving goals. Encourage the client to implement chosen solutions to current life problem(s), self-monitor and evaluate solution implementation, and reward self for efforts. Explore the relationship of negative self-talk (e.g., "I'm just a burden to everyone"; "Everyone would be better off if I were dead") and distorted beliefs (e.g., "There's no one like me living here"; "I'd rather never leave my room than have anyone see me in a wheelchair") to depressed mood (or assign "Negative Thoughts Trigger Negative Feelings" in the Adult Psychotherapy Homework Planner, 2nd ed. by Beacher Bottoms; or "Daily Record of  Dysfunctional Thoughts" in Cognitive Therapy of Depression by Bolling Bushy and Roselle Conner). Encourage the client to distinguish between all-or-nothing thinking (e.g., "Life in a nursing home can't be worth living") and genuine nuanced reflection on life's meaning and purpose (e.g., "What's giving my life meaning at this stage?"); confront the former, encourage the latter.  Diagnosis:Generalized anxiety disorder  Severe episode of recurrent major depressive disorder, without psychotic features (HCC)  Grief  Plan:  -self-care -meet again on Wednesday, Apr 09, 2024 at 8am virtual

## 2024-04-04 NOTE — Therapy (Signed)
 OUTPATIENT PHYSICAL THERAPY LOWER EXTREMITY TREATMENT   Patient Name: Heather Thompson MRN: [id number] DOB:[birth year], 73 y.o., female Today's Date: 04/04/2024  END OF SESSION:  PT End of Session - 04/04/24 1146     Visit Number 23    Number of Visits 32    Date for PT Re-Evaluation 05/07/24    Authorization Type Medicare    Authorization - Visit Number 23    Progress Note Due on Visit 30    PT Start Time 1100    PT Stop Time 1147    PT Time Calculation (min) 47 min    Activity Tolerance Patient tolerated treatment well    Behavior During Therapy WFL for tasks assessed/performed                                   Past Medical History:  Diagnosis Date   Abnormality of gait 04/23/2019   Acquired hypothyroidism 07/29/2021   Allergic rhinitis due to animal hair and dander 12/02/2015   Formatting of this note might be different from the original.  [facility]  Seen by Dr. Travis Friedman 11/11/15     Plan- Received allergy shots today. Follow up 1 year.     Allergy    Anemia    Anemia    Anxiety    Arthritis of scaphoid-trapezium-trapezoid joint of right hand 09/10/2023   B12 deficiency    Back pain    Chronic fatigue 07/29/2021   Chronic fatigue syndrome    Chronic throat clearing 03/14/2023   Clotting disorder (HCC)    Constipation    Depression with anxiety 04/07/2009   Formatting of this note might be different from the original.  well controlled on current meds, follows with psychiatrist in [place]. Had a   h/o severe depression 5 years ago, and had ECT.     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  B12 deficiency   Iron deficiency      Herniated lumbar intervertebral disc 11/12/2020   High cholesterol    History of blood clots    History of cervical spinal surgery 01/01/2018   Formatting of this note might be different from the original.  Fusion C6 and C7 per 2018 MRI report.     History of left hip replacement 01/14/2018   History of Roux-en-Y gastric bypass    Hoarseness 03/14/2023   Hyperlipidemia 08/19/2010   Hypertension    Hyponatremia 01/17/2018   Hypothyroidism    Hypothyroidism    IBS (irritable bowel syndrome) 04/06/2021   Impaired fasting glucose 08/19/2010   Incontinence 10/30/2013   Formatting of this note might be different from the original. Formatting of this note might be different from the original. Urinary Incontinence     Infertility, female    Insomnia 04/07/2009   Formatting of this note might be different from the original.  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  Followed by [facility]  Seen by Dr. Nyle Belling 03/01/15     S/p changing thoracic spinal cord stimulator generator 08/18/14.     Lumbar spine films show spinal cord generator in left flank area. 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  Follows with Rheumatology in [facility] of this note might be different from the original.  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  Getting iron infusion via port at hematalogy/oncology center at [facility].   Was recently hospitalized in [facility] for severe anemia     Formatting of this note might be different from the original.  on OTC b12 currently     Vitamin D  deficiency    Past Surgical History:  Procedure Laterality Date   ABDOMINAL HYSTERECTOMY     APPENDECTOMY     CARPAL TUNNEL RELEASE Bilateral 2023   with thumb  joint replacement   CERVICAL FUSION     CHOLECYSTECTOMY     FOOT SURGERY Bilateral    fusion   GASTRIC BYPASS     IR EMBO ARTERIAL NOT HEMORR HEMANG INC GUIDE ROADMAPPING  04/01/2024   IR RADIOLOGIST EVAL & MGMT  03/14/2024   SPINAL CORD STIMULATOR INSERTION  07/2023   TOTAL HIP ARTHROPLASTY Bilateral    11/2016 & 05/2017   Patient Active Problem List   Diagnosis Date Noted   Palpitations 10/04/2023   Cardiac murmur 10/04/2023   Allergy    Anemia    Anxiety    B12 deficiency  Back pain    Chronic fatigue syndrome    Clotting disorder (HCC)    Constipation    Edema of both lower extremities    Fatty liver    Fibromyalgia    Gallbladder problem    High cholesterol    History of blood clots    History of Roux-en-Y gastric bypass    Hypothyroidism    Infertility, female    Joint pain    Kidney stones    Muscle disorder    Osteoarthritis    Prediabetes    Salzmann nodular degeneration    SOBOE (shortness of breath on exertion)    Swallowing difficulty    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   Arthritis of scaphoid-trapezium-trapezoid joint of right hand 09/10/2023   Urge incontinence 08/28/2023   OAB (overactive bladder) 08/28/2023   Lumbar post-laminectomy syndrome 07/06/2023   Snapping hip syndrome, 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   Other myositis, left thigh 04/24/2023   Sacroiliitis, not elsewhere classified (HCC) 04/24/2023   Dysphagia 04/17/2023   Elevated liver enzymes 04/02/2023   Elevated TSH 04/02/2023   Chronic throat clearing 03/14/2023   Globus sensation 03/14/2023   Hoarseness 03/14/2023   Muscle tension dysphonia 03/12/2023   Unspecified inflammatory spondylopathy, 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   Primary osteoarthritis of both knees 12/21/2021   Port-A-Cath in place 07/29/2021   Acquired hypothyroidism 07/29/2021   Chronic pain syndrome 07/29/2021   Vitamin B12 deficiency 07/29/2021   Vitamin D  deficiency 07/29/2021   Postmenopausal estrogen  deficiency 07/29/2021   IBS (irritable bowel syndrome) 04/06/2021   Retention of urine 04/06/2021   Herniated lumbar intervertebral disc 11/12/2020   Skin sensation disturbance 11/12/2020   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   Spondylopathy, unspecified 11/12/2020   Trochanteric bursitis of both hips 11/19/2019   Low serum iron 04/23/2019   Osteomalacia 04/23/2019   Abnormality of gait 04/23/2019   History of bilateral hip arthroplasty 06/26/2018   Gastroesophageal reflux disease 06/18/2018   Hypertension 06/18/2018   Intertrigo 04/30/2018   Lentigines 04/30/2018   Hyponatremia 01/17/2018   History of cervical spinal surgery 01/01/2018   Obesity (BMI 30-39.9) 01/01/2018   SK (seborrheic keratosis) 04/18/2017   Pain management contract agreement 11/17/2016   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   Allergic rhinitis due to animal hair and dander 12/02/2015   Low back pain 03/03/2015   Incontinence 10/30/2013   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   Neutrophilic leukocytosis 08/12/2012   Thrombocytosis 08/12/2012   Osteoporosis, senile 11/15/2010   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   Hyperlipidemia 08/19/2010   Impaired fasting glucose 08/19/2010   Polyarthritis 09/28/2009   Degenerative disc disease 04/07/2009   Depression with anxiety 04/07/2009   Myopathy 04/07/2009   Insomnia 04/07/2009   Migraine 04/07/2009   Scoliosis 04/07/2009   Seasonal allergies 04/07/2009   Vitamin B12 deficiency anemia 04/07/2009    PCP: Cherre Cornish  REFERRING PROVIDER: Carlye Child DIAG: bilateral trochanteric bursitis, vertigo  THERAPY DIAG:  Bilateral hip pain  Muscle weakness (generalized)  Rationale for Evaluation and Treatment: Rehabilitation  ONSET DATE: 04/2023  SUBJECTIVE:   SUBJECTIVE STATEMENT: Pt states she had her knee procedure on Tuesday. She has some soreness post procedure and has antalgic gait walking into clinic today. She states she was able to drive starting  yesterday with some increased soreness.  PERTINENT HISTORY: Bilat THA 2018, fibromyalgia, OA, spinal cord stimulator, bilat TC joint fusion  Pt had bilateral hip replacements in 2018. Pt began having Lt hip and groin pain last summer that was so  severe she went to ED. She was eventually diagnosed with bursitis. Since that time the pain has continued and is "always there". She now has bilateral hip pain, groin pain. Hips are tender to touch. Pain increases with laying on her sides, pain with sit to stand, prolonged standing and walking. Pt has not found a way to relieve pain  Vestibular eval: pt states dizziness has been going on for "quite a while". Pt states she feels "room is spinning" when she goes from sit to stand. She reports she also feels that sensation when she goes from supine to sit but it is not as severe. She also states she veers to the right while walking. She also states that if she is standing still she will occasionally feel like she is "tipping over" to the right. The sensation only lasts a few seconds. Pt states she has had a cardiac work up which ruled out cardiac cause of dizziness PAIN:  Are you having pain? Yes: NPRS scale: 6/10 currently, 8-9/10 at worst Pain location: bilat hips Lt > Rt Pain description: sore, tender Aggravating factors: sitting in recliner, laying on sides, sit to stand Relieving factors: unknown  PRECAUTIONS: pt has spinal cord stimulator  RED FLAGS: None   WEIGHT BEARING RESTRICTIONS: No  FALLS:  Has patient fallen in last 6 months? No  OCCUPATION: retired  PLOF: Independent  PATIENT GOALS: reduce pain  NEXT MD VISIT: February 2025  OBJECTIVE:  Note: Objective measures were completed at Evaluation unless otherwise noted.  DIAGNOSTIC FINDINGS: x ray performed but not read at time as eval  PATIENT SURVEYS:  FOTO 39   MUSCLE LENGTH: Hamstrings: decreased bilat Hip flexors: decreased bilat   PALPATION: TTP bilat hip flexors,  ITB, glutes, HS Increased mm spasticity bilat hamstrings, glutes, quads  LOWER EXTREMITY ROM:  Active ROM Right eval Left eval  Hip flexion    Hip extension    Hip abduction    Hip adduction    Hip internal rotation    Hip external rotation 30 50  Knee flexion    Knee extension    Ankle dorsiflexion    Ankle plantarflexion    Ankle inversion    Ankle eversion     (Blank rows = not tested)  LOWER EXTREMITY MMT:  MMT Right eval Left eval Right 4/23 Left 4/23 Right 4/25  Hip flexion 3+ 3 3- 4+   Hip extension 3 3 3 4    Hip abduction 3 3 3- 4   Hip adduction       Hip internal rotation       Hip external rotation 3 3+ 3 4   Knee flexion     3  Knee extension     3-  Ankle dorsiflexion       Ankle plantarflexion     3  Ankle inversion       Ankle eversion        (Blank rows = not tested)   FUNCTIONAL TESTS:  5 times sit to stand: 18.85 seconds  VESTIBULAR ASSESSMENT:     SYMPTOM BEHAVIOR:  Subjective history: pt states dizziness has been going on for "quite a while". Pt states she feels "room is spinning" when she goes from sit to stand. She reports she also feels that sensation when she goes from supine to sit but it is not as severe. She also states she veers to the right while walking. She also states that if she is standing still she will occasionally feel  like she is "tipping over" to the right. The sensation only lasts a few seconds.  Non-Vestibular symptoms: none reported  Type of dizziness: Spinning/Vertigo  Frequency: multiple times a day  Duration: seconds  Aggravating factors: Induced by position change: supine to sit, sit to stand, and walking  Relieving factors: slow movements  Progression of symptoms: unchanged  OCULOMOTOR EXAM:  Ocular Alignment: normal  Ocular ROM: No Limitations  Spontaneous Nystagmus: absent  Gaze-Induced Nystagmus: age appropriate nystagmus at end range  Smooth Pursuits: saccades and pt reports "spinning" sensation when  focusing on pen  Saccades: intact     VESTIBULAR - OCULAR REFLEX:   Slow VOR: Positive Bilaterally increased spinning sensation  VOR Cancellation: Comment: increased spinning sensation  Head-Impulse Test: HIT Right: positive HIT Left: negative     POSITIONAL TESTING: Right Dix-Hallpike: no nystagmus Left Dix-Hallpike: no nystagmus Right Roll Test: no nystagmus Left Roll Test: no nystagmus   MOTION SENSITIVITY:    Motion Sensitivity Quotient Intensity: 0 = none, 1 = Lightheaded, 2 = Mild, 3 = Moderate, 4 = Severe, 5 = Vomiting  Intensity  1. Sitting to supine   2. Supine to L side   3. Supine to R side   4. Supine to sitting   5. L Hallpike-Dix   6. Up from L    7. R Hallpike-Dix   8. Up from R    9. Sitting, head  tipped to L knee   10. Head up from L  knee   11. Sitting, head  tipped to R knee   12. Head up from R  knee   13. Sitting head turns x5   14.Sitting head nods x5   15. In stance, 180  turn to L    16. In stance, 180  turn to R      [facility] Adult PT Treatment:                                                DATE: 04/04/24 Therapeutic Exercise/Activity: Nustep L5 x 6 min for mobility Resisted walking fwd/bkwd 15#, laterally 10# Knee flex/ext on slider - pt unable to tolerate LAQ due to pain in hip and knee   [facility] Adult PT Treatment:                                                DATE: 03/25/24 Therapeutic Exercise/Activity: Seated trunk rotation to address c/o back pain Physioball roll outs all directions Hip ER/IR after manual work IT consultant 2 x 10 Hip add isometric 10 x 3 sec hold Hip flexion seated with red TB 2 x 6 Manual Therapy: STM and TPR Rt hip flexors, adductors    [facility] Adult PT Treatment:                                                DATE: 03/21/24  Manual Therapy: STM and TPR Rt hip flexors, adductors Neuromuscular re-ed: Standing march with green TB pull down Standing row green TB in tandem stance Antirotation red TB with side  step Tandem stance without UE support  up to 10 sec bilat Therapeutic Activity: Continued education on rationale for treatment and progress throughout course of PT  [facility] Adult PT Treatment:                                                DATE: 03/19/24 Therapeutic Exercise/Activity: Strength measurements (see above) Hip ER/IR isometrics in modified butterfly position (Rt) Review of progress towards goals and remaining goals Review of updated HEP Manual Therapy: STM and TPR Rt hip flexors, adductors                                                                                          PATIENT EDUCATION:  Education details: PT POC and goals, HEP Person educated: Patient Education method: Explanation, Demonstration, and Handouts Education comprehension: verbalized understanding and returned demonstration  HOME EXERCISE PROGRAM: Access Code: [email or web address] URL: [email or web address] Date: 03/19/2024 Prepared by: Lowery Rue  Exercises - Sit to Stand  - 1 x daily - 7 x weekly - 2 sets - 10 reps - Seated Hip Flexor Stretch  - 1 x daily - 7 x weekly - 1 sets - 3 reps - 30 seconds hold - Resistance Pulldown with March  - 1 x daily - 7 x weekly - 3 sets - 10 reps - Standing Bilateral Low Shoulder Row with Anchored Resistance  - 1 x daily - 7 x weekly - 3 sets - 10 reps - Standing Isometric Hip Flexion with Ball at [employer]  - 1 x daily - 7 x weekly - 2 sets - 10 reps - Standing Isometric Hip Abduction with Ball on Wall  - 1 x daily - 7 x weekly - 2 sets - 10 reps - Seated Hip Adduction Isometrics with Ball  - 1 x daily - 7 x weekly - 2 sets - 10 reps - Supine Hip Adductor Stretch  - 1 x daily - 7 x weekly - 1 sets - 3 reps - 30 seconds hold - standing marching with head turns  - 1 x daily - 7 x weekly - 3 sets - 10 reps  ASSESSMENT:  CLINICAL IMPRESSION: Session focused on gentle exercise due to pt with procedure on Tuesday limiting strength training of Rt LE. Pt has cervical  procedure next week and we will return to normal strengthening program as able  OBJECTIVE IMPAIRMENTS: decreased activity tolerance, decreased mobility, difficulty walking, decreased ROM, decreased strength, increased muscle spasms, impaired flexibility, and pain.     GOALS: Goals reviewed with patient? Yes  SHORT TERM GOALS: Target date: 01/09/2024   Pt will be independent with initial HEP Baseline: Goal status: MET  2.  Pt will improve FOTO to > = 45 to demo improving functional mobility Baseline: 33 on 01/11/24 Goal status: IN PROGRESS   LONG TERM GOALS: Target date: 04/30/2024    Pt will be independent with advanced HEP and plan for continued exercise in community Baseline:  Goal status: INITIAL  2.  Pt will improve FOTO to >= 50 to demo improved  functional mobility Baseline:  Goal status: IN PROGRESS  3.  Pt will improve hip strength to 4+/5 bilat to improve standing and walking tolerance Baseline: see above Goal status: IN PROGRESS  4.  Pt will tolerate laying on both sides with pain <= 2/10 Baseline:  Goal status: MET  5.  Pt will improve 5 x STS to <= 15 seconds Baseline:  Goal status: IN PROGRESS - 23.11 seconds on 03/11/24  6.  Pt will report transitioning sit to stand without dizziness symptoms 75% of the time Baseline:  Goal status: MET 7.  Pt will demo decreased risk of falls with DGI >= 21/24 Baseline: 18/24 Goal status: MET - 21/24 on 03/11/24  8.  Pt will report baseline pain in hips 2-4/10 Baseline: 4-8/10 Goal status: INITIAL        PLAN:  PT FREQUENCY: 2x/week  PT DURATION: 6 weeks  PLANNED INTERVENTIONS: 97164- PT Re-evaluation, 97110-Therapeutic exercises, 97530- Therapeutic activity, 97112- Neuromuscular re-education, 97535- Self Care, 16109- Manual therapy, [id number]- Aquatic Therapy, 97035- Ultrasound, [id number]- Ionotophoresis 4mg /ml Dexamethasone , Patient/Family education, Balance training, Taping, Dry Needling, Cryotherapy, and Moist  heat  PLAN FOR NEXT SESSION:  manual as indicated, hip stability, hip strengthening   Sharanda Shinault, PT 04/04/2024, 11:47 AM

## 2024-04-07 ENCOUNTER — Ambulatory Visit: Admitting: Physical Therapy

## 2024-04-07 ENCOUNTER — Other Ambulatory Visit: Payer: Self-pay | Admitting: Medical-Surgical

## 2024-04-07 NOTE — Addendum Note (Signed)
 Addended by: Janean Meals on: 04/07/2024 11:14 AM   Modules accepted: Level of Service

## 2024-04-08 ENCOUNTER — Ambulatory Visit: Admitting: Professional

## 2024-04-09 ENCOUNTER — Ambulatory Visit: Admitting: Physical Therapy

## 2024-04-09 ENCOUNTER — Encounter: Payer: Self-pay | Admitting: Physical Therapy

## 2024-04-09 ENCOUNTER — Telehealth: Payer: Self-pay

## 2024-04-09 ENCOUNTER — Encounter: Payer: Self-pay | Admitting: Professional

## 2024-04-09 ENCOUNTER — Ambulatory Visit (INDEPENDENT_AMBULATORY_CARE_PROVIDER_SITE_OTHER): Admitting: Professional

## 2024-04-09 DIAGNOSIS — F332 Major depressive disorder, recurrent severe without psychotic features: Secondary | ICD-10-CM | POA: Diagnosis not present

## 2024-04-09 DIAGNOSIS — F4321 Adjustment disorder with depressed mood: Secondary | ICD-10-CM

## 2024-04-09 DIAGNOSIS — F411 Generalized anxiety disorder: Secondary | ICD-10-CM | POA: Diagnosis not present

## 2024-04-09 DIAGNOSIS — M6281 Muscle weakness (generalized): Secondary | ICD-10-CM

## 2024-04-09 DIAGNOSIS — M25552 Pain in left hip: Secondary | ICD-10-CM

## 2024-04-09 DIAGNOSIS — M25551 Pain in right hip: Secondary | ICD-10-CM | POA: Diagnosis not present

## 2024-04-09 NOTE — Progress Notes (Signed)
 [facility] Counselor/Therapist Progress Note  Patient ID: Heather Thompson, MRN: [id number],    Date: 04/09/2024  Time Spent: 70 minutes [id number]  Treatment Type: Individual Therapy  Risk Assessment: Danger to Self:  No; pt commented on having pain medication that is lethal and upon further questioning she stated there is nothing that couple happen that would ever cause her to attempt suicide Self-injurious Behavior: No Danger to Others: No  Subjective: This session was held via video teletherapy. The patient consented to video teletherapy and was located in her home for this session. She is aware it is the responsibility of the patient to secure confidentiality on her end of the session. The provider was in a private home office for the duration of this session.    The patient arrived early for her Caregility appointment.  Issues addressed: 1-talked about [place] trip again a-pt reviewed how challenging her trip to [place] for Josh's graduation was -she and Josh both missing pt's husband Berlin Breen b-his degree was in Retail banker -her grandson was highly decorated with the highest honors -looking at [employer] or air force base at [employer] in [place] with Paediatric nurse c-acknowledged that she was able to first discuss her positive interactions with her grandson -she then again discussed her feelings related to being the fifth wheel -Josh's graduation party was canceled until this past weekend due to rain -pt had to make a decision about whether to stay and she decided to return home -she asked Raynelle Callow why it would not be moved inside to their home and not at the farm -Josh wanted to enjoy the farm and being outside with his friends -she missed not being able to be there for the party but did have quality time with Jerrilyn Moras on Sunday 2-daughter Raynelle Callow is controlling a-she has refused to allow her son to choose police officer or [employer] as options because he would be too  much risk b-pt asked Raynelle Callow if Jerrilyn Moras were to move what would she do and she stated she would move c-pt's son Scott's family did not respond to invitation and they did not attend -Nathan's brother and "lady friend" came -Sandy's FIL/MIL on opposite end of pt d-Sandy said that when invitations for graduation and party and if Geralyn Knee did not come she was not going to talk to [place] [employer] again -[employer] never had anything in common and they are not close with Jerrilyn Moras -on graduation day Geralyn Knee responded that he was busy and not going to graduation -on Sunday Sandy "unfriended" her brother and his family -Geralyn Knee texted [place] that he saw they were all unfriended and asked if they were invited to the party date -Scott's oldest daughter Nellie Banas is getting married in the fall Raynelle Callow says that they will not be attending e-"where does that leave me" -pt will be staying at her mother's home and will be transported to/from the event and is already worrying about what she will say when people ask f-pt feels like she is in the middle -"I talk to [place] but not as much as [employer] -"it is really really awkward" that her children are not talking 3-anxiety a-how to avoid living in an anxious state -how to settle in to life without worry b-living in the moment 4-own life a-did not expect but realizes that it was [employer] -"I'm still trying to figure out me, what is the purpose for the rest of my life" -her plan was never to live in [place] it was [place] with family or  in [place] with my husband b-"I have become closer to God since I am on my own" c-she is excited to be going on a pilgrimage weekend at [facility] with church family -years ago she did some women's weekends 5-physical -knee procedure this week went well  Treatment Plan Problems: Financial Stress, Low Self-Esteem, Attention Deficit Disorder (ADD) - Adult Symptoms: Unable to concentrate or pay attention to things of low interest, even when those things are  important to his/her life. Restless and fidgety; unable to be sedentary for more than a short time. Disorganized in most areas of his/her life. Starts many projects but rarely finishes any. Chronic low self-esteem. Tendency toward addictive behaviors. Indebtedness and overdue bills that exceed ability to meet monthly payments. A long-term lack of discipline in money management that has led to excessive indebtedness. A pattern of impulsive spending that does not consider the eventual financial consequences. Inability to accept compliments. Makes self-disparaging remarks; sees self as unattractive, worthless, a loser, a burden, unimportant; takes blame easily. Difficulty in saying no to others; assumes not being liked by others. Fear of rejection by others, especially peer group. Lack of any goals for life and setting of inappropriately low goals for self. Inability to identify positive characteristics of self. Anxious and uncomfortable in social situations. Goals: Reduce impulsive actions while increasing concentration and focus on low-interest activities. Minimize ADD behavioral interference in daily life. Sustain attention and concentration for consistently longer periods of time. Achieve an inner strength to control personal impulses, cravings, and desires that directly or indirectly increase debt irresponsibly. Resolve financial crisis with a path to eliminate debt. Revise spending patterns to not exceed income. Gain a new sense of self-worth in which the substance of one's value is not attached to the capacity to do things or own things that cost money. Understand personal desires, insecurities, and anxieties that make overspending possible. Elevate self-esteem. Develop a consistent, positive self-image. Demonstrate improved self-esteem through more pride in appearance, more assertiveness, greater eye contact, and identification of positive traits in self-talk messages. Establish an  inward sense of self-worth, confidence, and competence. Interact socially without undue distress or disability. Objectives target date for all objectives is 03/25/2025: Identify the current specific ADD behaviors that cause the most difficulty. List the negative consequences of the ADD problematic behavior. Learn and implement organization and planning skills. Acknowledge procrastination and the need to reduce it. Learn and implement skills to reduce procrastination. Learn and implement skills to reduce the disruptive influence of distractibility. Combine skills learned in therapy into a new daily approach to managing ADHD. Use cognitive and behavioral strategies to control the impulse to make unnecessary and unaffordable purchases. Report instances of successful control over impulse to spend on unnecessary expenses. Increase insight into the historical and current sources of low self-esteem. Decrease the frequency of negative self-descriptive statements and increase frequency of positive self-descriptive statements. Articulate a plan to be proactive in trying to get identified needs met. Form realistic, appropriate, and attainable goals for self in all areas of life. Take verbal responsibility for accomplishments without discounting. Identify and replace negative self-talk messages used to reinforce low self-esteem. Interventions: Assist the client in identifying the current specific behaviors that cause him/her the most difficulty functioning as part of identifying treatment targets (i.e., a functional analysis). Select situations in which the client will be increasingly challenged to apply his/her new strategies for managing ADHD, starting with situations highly likely to be successful. Teach the client meditational and self-control strategies (e.g., "stop,  look, listen, and think") to delay the need for instant gratification and inhibit impulses to achieve more meaningful, longer-term  goals. Teach the client to apply new problem-solving skills to planning as a first step in overcoming procrastination; for each plan, break it down into manageable time-limited steps to reduce the influence of distractibility. Assist the client in identifying positives and negatives of procrastinating toward the goal of engaging him/her in staying focused. Teach the client to use timers or other cues to remind him/her to stop tasks before he/she gets distracted in an effort to reduce the time they may be distracted and off-task (see Mastering Your Adult ADHD: Therapist Guide by Loura Rower al.). Teach the client to break down tasks into meaningful smaller units that can be completed without being distracted based on their demonstrated attention span. Teach the client stimulus control techniques that use external structure (e.g., lists, reminders, files, daily rituals) to improve on-task behavior; remove distracting stimuli in the environment; encourage the client to reward himself/herself for successful focus and follow-through. Assess the client's typical attention span by having them do a few "boring" tasks (e.g., sorting bills, reading something uninteresting) to the point that they report distraction; use this as an approximate measure of their typical attention span. Teach the client organization and planning skills including the routine use of a calendar and daily task list. Teach the client problem-solving skills (i.e., identify problem, brainstorm all possible options, evaluate the pros and cons of each option, select best option, implement a course of action, and evaluate results) as an approach to planning; for each plan, break it down into manageable time-limited steps to reduce the influence of distractibility. Assign the client to make a list of negative consequences that he/she has experienced or that could result from a continuation of the problematic behavior; process the list (or assign  "Impulsive Behavior Journal" in the Adult Psychotherapy Homework Planner by Beacher Bottoms). Help the client identify his/her distorted, negative beliefs about self and the world and replace these messages with more realistic, affirmative messages (or assign "Journal and Replace Self-Defeating Thoughts" in the Adult Psychotherapy Homework Planner by [facility] or read What to Say When You Talk to Yourself by Helmstetter). Assist the client in becoming aware of how he/she expresses or acts out negative feelings about himself/herself. Help the client reframe his/her negative assessment of himself/herself. Help the client become aware of his/her fear of rejection and its connection with past rejection or abandonment experiences; begin to contrast past experiences of pain with present experiences of acceptance and competence. Ask the client to list accomplishments; process the integration of these into his/her self-image. Help the client analyze his/her goals to make sure they are realistic and attainable. Assign the client to make a list of goals for various areas of life and a plan for steps toward goal attainment. Assist the client in identifying and verbalizing his/her needs, met and unmet. Reinforce with praise and encouragement all of the client's reports of resisting the urge to overspend. Urge the client to avoid all impulse buying by delaying every purchase until after 24 hours of thought and by buying only from a prewritten list of items to buy (consider assigning "Impulsive Behavior Journal" from the Adult Psychotherapy Homework Planner by Beacher Bottoms). Teach the client the cognitive strategy of asking self before each purchase: Is this purchase absolutely necessary? Can we afford this? Do we have the cash to pay for this without incurring any further debt? Role-play situations in which the client must resist external  pressure to spend beyond what he/she can afford (e.g., friend's invitation to golf or go  shopping, child's request for a toy), emphasizing being graciously assertive in refusing the request. Role-play situations in which the client must resist the inner temptation to spend beyond reasonable limits, emphasizing positive self-talk that compliments self for being disciplined. Objectives target date for all objectives is 03/13/2025: Identify the specific anxiety symptoms that are personally most disturbing or most contributing to impaired functioning. Learn and practice thought and behavioral control methods to minimize and control anxiety symptoms once they have begun. Identify specific thoughts that precipitate anxiety symptoms. Replace anxiety-producing thoughts with constructive thoughts. Verbalize an understanding of the general physical and cognitive manifestations of the causes for anxiety. Identify and clarify the patterns of anxiety precipitants and consequences. Identify whether the symptoms of depression seem to be primarily related to interpersonal relationships, stressful life events or circumstances, thoughts/beliefs, or behaviors. Verbalize an understanding of the general physical and psychological effects of depression. Replace depression-promoting thoughts with mood-elevating thoughts. Keep a daily record of mood rating from 1 to 10, noting associated behaviors, activities, events, people, and thoughts. Identify specific thoughts that precipitate depressive moods. Monitor and report depressive symptoms, completing self-report assessment on a periodic basis. Describe and evaluate significant past and current interpersonal relationships. Tell memories of the deceased person, starting with positive memories. Consider possible ways of increasing involvement with others. Accept the reality of loss and tolerate the pain of intense grief. Engage in events and activities that increase level of social involvements. Identify and monitor specific pain triggers. Learn and implement  somatic skills such as relaxation and/or biofeedback to reduce pain level. Learn mental coping skills and implement with somatic skills for managing acute pain. Interventions: Reinforce the client's belief system and/or refer to a spiritual leader who can support the client's faith. Explore and problem-solve with the client difficulties, fears, or barriers related to increasing social involvement. Point out to the client that engagement with others is associated with decreased feelings of loneliness, grief, depression, and hopelessness. Provide encouragement and support to the client for increased engagement in contact with others. Clarify with the client that expression of negative thoughts or feelings about the deceased is not disloyal. Assist the client in remembering the loved one in realistic ways (i.e., includes attractions, positive traits, negative traits, conflicts, ambivalence, dependency, etc.), a memory that is neither idealized nor demonized, so that the client's relationship with the deceased person can be appraised realistically (or assign "Dear _____: A Letter to a Lost Loved One" in the Adult Psychotherapy Homework Planner, 2nd ed. by Beacher Bottoms). Assign the client to read about progressive muscle relaxation and other calming strategies in relevant books or treatment manuals (e.g., Progressive Relaxation Training by Juleen Oakland). Assign a homework exercise in which the client implements somatic pain management skills and records the result; review and process during the treatment session. Teach the client distraction techniques (e.g., pleasant imagery, counting techniques, alternative focal points) and how to use them for relaxation skills for the management of acute episodes of pain. Ask the client to create a list of activities that are pleasurable to him/her (or assign "Identify and Schedule Pleasant Activities" in the Adult Psychotherapy Homework Planner, 2nd ed. by Beacher Bottoms);  process the list, developing a plan of increasing the frequency of engaging in the selected pleasurable activities. Teach the client problem-solving skills to apply to removal of obstacles to implementing new skills; role-play implementation of these skills to obstacles. Assist the client in  identifying the most effective personal stress management techniques (e.g., prayer, walking, baking, telephoning a friend), and encourage daily scheduling of these activities (or assign "Past Successful Anxiety Coping" in the Adult Psychotherapy Homework Planner, 2nd ed. by Beacher Bottoms). Teach the client the concept of finding a good match between the individual's capacities and the demands of the physical environment. If the physical environment is too demanding for the individual's capacities (e.g., frail older adult taking care of a large house), the individual can become overwhelmed and may need to move from large home to smaller accommodations or residence for older adults). While reviewing the client's anxiety symptom chart, help him/her recognize patterns associated with anxiety symptoms: sort out precipitants from consequences, identify the most intense or frequent precipitants, and identify the consequences that help to perpetuate maladaptive patterns. Assist the client in identifying his/her current attempts to reducing anxiety (e.g., constantly telephoning family or physician, making unneeded doctor appointments or going to the emergency room), their apparent positive consequences (e.g., feeling better, getting attention), but longer-term negative consequences (e.g., physician won't return calls, friends avoid the client because of expressions of worry). Teach the client that his/her mood can be improved by increasing pleasant events and decreasing unpleasant events. Encourage the client to identify pleasant events that are desirable, but are not currently a part of his/her daily routine (see "Identify and Schedule  Pleasant Activities" in the Adult Psychotherapy Homework Planner, 2nd ed. by Beacher Bottoms). Develop a one-week daily schedule with the client that increases pleasant events and decreases unpleasant events, making sure to have at least one pleasant event every day. Help the client to understand his/her strengths and weaknesses in problem-solving, to view problems as challenges and not personal deficits, and to identify and define current problem(s) that will be the focus of treatment. Help the client to identify realistic goals to address problem(s) of concern and identify major obstacles in achieving goals. Encourage the client to implement chosen solutions to current life problem(s), self-monitor and evaluate solution implementation, and reward self for efforts. Explore the relationship of negative self-talk (e.g., "I'm just a burden to everyone"; "Everyone would be better off if I were dead") and distorted beliefs (e.g., "There's no one like me living here"; "I'd rather never leave my room than have anyone see me in a wheelchair") to depressed mood (or assign "Negative Thoughts Trigger Negative Feelings" in the Adult Psychotherapy Homework Planner, 2nd ed. by Beacher Bottoms; or "Daily Record of Dysfunctional Thoughts" in Cognitive Therapy of Depression by Bolling Bushy and Roselle Conner). Encourage the client to distinguish between all-or-nothing thinking (e.g., "Life in a nursing home can't be worth living") and genuine nuanced reflection on life's meaning and purpose (e.g., "What's giving my life meaning at this stage?"); confront the former, encourage the latter.  Diagnosis:Generalized anxiety disorder  Severe episode of recurrent major depressive disorder, without psychotic features (HCC)  Grief  Plan:  -enjoy time away with church family -meet again on Thursday, Apr 24, 2024 at 4pm in person

## 2024-04-09 NOTE — Therapy (Signed)
 OUTPATIENT PHYSICAL THERAPY LOWER EXTREMITY TREATMENT   Patient Name: Heather Thompson MRN: [id number] DOB:02-Jun-1951, 73 y.o., female Today's Date: 04/09/2024  END OF SESSION:  PT End of Session - 04/09/24 1452     Visit Number 24    Number of Visits 32    Date for PT Re-Evaluation 05/07/24    Authorization Type Medicare    Authorization - Visit Number 24    Progress Note Due on Visit 30    PT Start Time 1400    PT Stop Time 1447    PT Time Calculation (min) 47 min    Activity Tolerance Patient tolerated treatment well    Behavior During Therapy [facility] for tasks assessed/performed                                    Past Medical History:  Diagnosis Date   Abnormality of gait 04/23/2019   Acquired hypothyroidism 07/29/2021   Allergic rhinitis due to animal hair and dander 12/02/2015   Formatting of this note might be different from the original.  [facility]  Seen by Dr. Travis Friedman 11/11/15     Plan- Received allergy shots today. Follow up 1 year.     Allergy    Anemia    Anemia    Anxiety    Arthritis of scaphoid-trapezium-trapezoid joint of right hand 09/10/2023   B12 deficiency    Back pain    Chronic fatigue 07/29/2021   Chronic fatigue syndrome    Chronic throat clearing 03/14/2023   Clotting disorder (HCC)    Constipation    Depression with anxiety 04/07/2009   Formatting of this note might be different from the original.  well controlled on current meds, follows with psychiatrist in [place]. Had a   h/o severe depression 5 years ago, and had ECT.     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  B12 deficiency   Iron  deficiency     Herniated lumbar intervertebral disc 11/12/2020   High cholesterol    History of blood clots    History of cervical spinal surgery 01/01/2018   Formatting of this note might be different from the original.  Fusion C6 and C7 per 2018 MRI report.     History of left hip replacement 01/14/2018   History of Roux-en-Y gastric bypass    Hoarseness 03/14/2023   Hyperlipidemia 08/19/2010   Hypertension    Hyponatremia 01/17/2018   Hypothyroidism    Hypothyroidism    IBS (irritable bowel syndrome) 04/06/2021   Impaired fasting glucose 08/19/2010   Incontinence 10/30/2013   Formatting of this note might be different from the original. Formatting of this note might be different from the original. Urinary Incontinence     Infertility, female    Insomnia 04/07/2009   Formatting of this note might be different from the original.  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  Followed by [facility]  Seen by Dr. Nyle Belling 03/01/15     S/p changing thoracic spinal cord stimulator generator 08/18/14.     Lumbar spine films show spinal cord generator in left flank area. 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  Follows with Rheumatology in [facility] of this note might be different from the original.  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  Getting iron infusion via port at hematalogy/oncology center at [facility].   Was recently hospitalized in [facility] for severe anemia     Formatting of this note might be different from the original.  on OTC b12 currently     Vitamin D  deficiency    Past Surgical History:  Procedure Laterality Date   ABDOMINAL HYSTERECTOMY     APPENDECTOMY     CARPAL TUNNEL RELEASE Bilateral 2023    with thumb joint replacement   CERVICAL FUSION     CHOLECYSTECTOMY     FOOT SURGERY Bilateral    fusion   GASTRIC BYPASS     IR EMBO ARTERIAL NOT HEMORR HEMANG INC GUIDE ROADMAPPING  04/01/2024   IR RADIOLOGIST EVAL & MGMT  03/14/2024   SPINAL CORD STIMULATOR INSERTION  07/2023   TOTAL HIP ARTHROPLASTY Bilateral    11/2016 & 05/2017   Patient Active Problem List   Diagnosis Date Noted   Palpitations 10/04/2023   Cardiac murmur 10/04/2023   Allergy    Anemia    Anxiety    B12  deficiency    Back pain    Chronic fatigue syndrome    Clotting disorder (HCC)    Constipation    Edema of both lower extremities    Fatty liver    Fibromyalgia    Gallbladder problem    High cholesterol    History of blood clots    History of Roux-en-Y gastric bypass    Hypothyroidism    Infertility, female    Joint pain    Kidney stones    Muscle disorder    Osteoarthritis    Prediabetes    Salzmann nodular degeneration    SOBOE (shortness of breath on exertion)    Swallowing difficulty    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   Arthritis of scaphoid-trapezium-trapezoid joint of right hand 09/10/2023   Urge incontinence 08/28/2023   OAB (overactive bladder) 08/28/2023   Lumbar post-laminectomy syndrome 07/06/2023   Snapping hip syndrome, 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   Other myositis, left thigh 04/24/2023   Sacroiliitis, not elsewhere classified (HCC) 04/24/2023   Dysphagia 04/17/2023   Elevated liver enzymes 04/02/2023   Elevated TSH 04/02/2023   Chronic throat clearing 03/14/2023   Globus sensation 03/14/2023   Hoarseness 03/14/2023   Muscle tension dysphonia 03/12/2023   Unspecified inflammatory spondylopathy, 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   Primary osteoarthritis of both knees 12/21/2021   Port-A-Cath in place 07/29/2021   Acquired hypothyroidism 07/29/2021   Chronic pain syndrome 07/29/2021   Vitamin B12 deficiency 07/29/2021   Vitamin D  deficiency 07/29/2021   Postmenopausal  estrogen deficiency 07/29/2021   IBS (irritable bowel syndrome) 04/06/2021   Retention of urine 04/06/2021   Herniated lumbar intervertebral disc 11/12/2020   Skin sensation disturbance 11/12/2020   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   Spondylopathy, unspecified 11/12/2020   Trochanteric bursitis of both hips 11/19/2019   Low serum iron 04/23/2019   Osteomalacia 04/23/2019   Abnormality of gait 04/23/2019   History of bilateral hip arthroplasty 06/26/2018   Gastroesophageal reflux disease 06/18/2018   Hypertension 06/18/2018   Intertrigo 04/30/2018   Lentigines 04/30/2018   Hyponatremia 01/17/2018   History of cervical spinal surgery 01/01/2018   Obesity (BMI 30-39.9) 01/01/2018   SK (seborrheic keratosis) 04/18/2017   Pain management contract agreement 11/17/2016   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   Allergic rhinitis due to animal hair and dander 12/02/2015   Low back pain 03/03/2015   Incontinence 10/30/2013   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   Neutrophilic leukocytosis 08/12/2012   Thrombocytosis 08/12/2012   Osteoporosis, senile 11/15/2010   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   Hyperlipidemia 08/19/2010   Impaired fasting glucose 08/19/2010   Polyarthritis 09/28/2009   Degenerative disc disease 04/07/2009   Depression with anxiety 04/07/2009   Myopathy 04/07/2009   Insomnia 04/07/2009   Migraine 04/07/2009   Scoliosis 04/07/2009   Seasonal allergies 04/07/2009   Vitamin B12 deficiency anemia 04/07/2009    PCP: Cherre Cornish  REFERRING PROVIDER: Carlye Child DIAG: bilateral trochanteric bursitis, vertigo  THERAPY DIAG:  Bilateral hip pain  Muscle weakness (generalized)  Rationale for Evaluation and Treatment: Rehabilitation  ONSET DATE: 04/2023  SUBJECTIVE:   SUBJECTIVE STATEMENT: Pt had cervical procedure on Monday and states "my neck hurts like hell". She states her knee is "ok" from procedure last week. She thinks her knee may feel "a little  better". She states her hip and groin pain is "not bad right now"  PERTINENT HISTORY: Bilat THA 2018, fibromyalgia, OA, spinal cord stimulator, bilat TC joint fusion  Pt had bilateral hip replacements in 2018. Pt  began having Lt hip and groin pain last summer that was so severe she went to ED. She was eventually diagnosed with bursitis. Since that time the pain has continued and is "always there". She now has bilateral hip pain, groin pain. Hips are tender to touch. Pain increases with laying on her sides, pain with sit to stand, prolonged standing and walking. Pt has not found a way to relieve pain  Vestibular eval: pt states dizziness has been going on for "quite a while". Pt states she feels "room is spinning" when she goes from sit to stand. She reports she also feels that sensation when she goes from supine to sit but it is not as severe. She also states she veers to the right while walking. She also states that if she is standing still she will occasionally feel like she is "tipping over" to the right. The sensation only lasts a few seconds. Pt states she has had a cardiac work up which ruled out cardiac cause of dizziness PAIN:  Are you having pain? Yes: NPRS scale: 5/10 currently, 8-9/10 at worst Pain location: bilat hips Lt > Rt Pain description: sore, tender Aggravating factors: sitting in recliner, laying on sides, sit to stand Relieving factors: unknown  PRECAUTIONS: pt has spinal cord stimulator  RED FLAGS: None   WEIGHT BEARING RESTRICTIONS: No  FALLS:  Has patient fallen in last 6 months? No  OCCUPATION: retired  PLOF: Independent  PATIENT GOALS: reduce pain  NEXT MD VISIT: February 2025  OBJECTIVE:  Note: Objective measures were completed at Evaluation unless otherwise noted.  DIAGNOSTIC FINDINGS: x ray performed but not read at time as eval  PATIENT SURVEYS:  FOTO 39   MUSCLE LENGTH: Hamstrings: decreased bilat Hip flexors: decreased  bilat   PALPATION: TTP bilat hip flexors, ITB, glutes, HS Increased mm spasticity bilat hamstrings, glutes, quads  LOWER EXTREMITY ROM:  Active ROM Right eval Left eval  Hip flexion    Hip extension    Hip abduction    Hip adduction    Hip internal rotation    Hip external rotation 30 50  Knee flexion    Knee extension    Ankle dorsiflexion    Ankle plantarflexion    Ankle inversion    Ankle eversion     (Blank rows = not tested)  LOWER EXTREMITY MMT:  MMT Right eval Left eval Right 4/23 Left 4/23 Right 4/25 Right 04/09/24  Hip flexion 3+ 3 3- 4+  3-  Hip extension 3 3 3 4     Hip abduction 3 3 3- 4  3+  Hip adduction        Hip internal rotation        Hip external rotation 3 3+ 3 4    Knee flexion     3 3+  Knee extension     3- 3  Ankle dorsiflexion        Ankle plantarflexion     3   Ankle inversion        Ankle eversion         (Blank rows = not tested)   FUNCTIONAL TESTS:  5 times sit to stand: 18.85 seconds  VESTIBULAR ASSESSMENT:     SYMPTOM BEHAVIOR:  Subjective history: pt states dizziness has been going on for "quite a while". Pt states she feels "room is spinning" when she goes from sit to stand. She reports she also feels that sensation when she goes from supine to sit but it is  not as severe. She also states she veers to the right while walking. She also states that if she is standing still she will occasionally feel like she is "tipping over" to the right. The sensation only lasts a few seconds.  Non-Vestibular symptoms: none reported  Type of dizziness: Spinning/Vertigo  Frequency: multiple times a day  Duration: seconds  Aggravating factors: Induced by position change: supine to sit, sit to stand, and walking  Relieving factors: slow movements  Progression of symptoms: unchanged  OCULOMOTOR EXAM:  Ocular Alignment: normal  Ocular ROM: No Limitations  Spontaneous Nystagmus: absent  Gaze-Induced Nystagmus: age appropriate nystagmus at  end range  Smooth Pursuits: saccades and pt reports "spinning" sensation when focusing on pen  Saccades: intact     VESTIBULAR - OCULAR REFLEX:   Slow VOR: Positive Bilaterally increased spinning sensation  VOR Cancellation: Comment: increased spinning sensation  Head-Impulse Test: HIT Right: positive HIT Left: negative     POSITIONAL TESTING: Right Dix-Hallpike: no nystagmus Left Dix-Hallpike: no nystagmus Right Roll Test: no nystagmus Left Roll Test: no nystagmus   MOTION SENSITIVITY:    Motion Sensitivity Quotient Intensity: 0 = none, 1 = Lightheaded, 2 = Mild, 3 = Moderate, 4 = Severe, 5 = Vomiting  Intensity  1. Sitting to supine   2. Supine to L side   3. Supine to R side   4. Supine to sitting   5. L Hallpike-Dix   6. Up from L    7. R Hallpike-Dix   8. Up from R    9. Sitting, head  tipped to L knee   10. Head up from L  knee   11. Sitting, head  tipped to R knee   12. Head up from R  knee   13. Sitting head turns x5   14.Sitting head nods x5   15. In stance, 180  turn to L    16. In stance, 180  turn to R      [facility] Adult PT Treatment:                                                DATE: 04/09/24 Therapeutic Exercise/Activity: Nustep L5 x 5 min Strength measurements (see above) Hip IR with ball squeeze as fulcrum 2 x 5 Bent over hip ext 3 x 5 Rt, 2 x 10 Lt Ball on wall hip abduction - limited by pain in Rt hip with both directions Walking with resistance laterally Lt 10#, Rt 5#   [facility] Adult PT Treatment:                                                DATE: 04/04/24 Therapeutic Exercise/Activity: Nustep L5 x 6 min for mobility Resisted walking fwd/bkwd 15#, laterally 10# Knee flex/ext on slider - pt unable to tolerate LAQ due to pain in hip and knee   [facility] Adult PT Treatment:                                                DATE: 03/25/24 Therapeutic Exercise/Activity: Seated trunk rotation to address c/o  back pain Physioball roll outs all  directions Hip ER/IR after manual work IT consultant 2 x 10 Hip add isometric 10 x 3 sec hold Hip flexion seated with red TB 2 x 6 Manual Therapy: STM and TPR Rt hip flexors, adductors    [facility] Adult PT Treatment:                                                DATE: 03/21/24  Manual Therapy: STM and TPR Rt hip flexors, adductors Neuromuscular re-ed: Standing march with green TB pull down Standing row green TB in tandem stance Antirotation red TB with side step Tandem stance without UE support up to 10 sec bilat Therapeutic Activity: Continued education on rationale for treatment and progress throughout course of PT               PATIENT EDUCATION:  Education details: PT POC and goals, HEP Person educated: Patient Education method: Explanation, Demonstration, and Handouts Education comprehension: verbalized understanding and returned demonstration  HOME EXERCISE PROGRAM: Access Code: [email or web address] URL: [email or web address] Date: 03/19/2024 Prepared by: Lowery Rue  Exercises - Sit to Stand  - 1 x daily - 7 x weekly - 2 sets - 10 reps - Seated Hip Flexor Stretch  - 1 x daily - 7 x weekly - 1 sets - 3 reps - 30 seconds hold - Resistance Pulldown with March  - 1 x daily - 7 x weekly - 3 sets - 10 reps - Standing Bilateral Low Shoulder Row with Anchored Resistance  - 1 x daily - 7 x weekly - 3 sets - 10 reps - Standing Isometric Hip Flexion with Ball at [employer]  - 1 x daily - 7 x weekly - 2 sets - 10 reps - Standing Isometric Hip Abduction with Ball on Wall  - 1 x daily - 7 x weekly - 2 sets - 10 reps - Seated Hip Adduction Isometrics with Ball  - 1 x daily - 7 x weekly - 2 sets - 10 reps - Supine Hip Adductor Stretch  - 1 x daily - 7 x weekly - 1 sets - 3 reps - 30 seconds hold - standing marching with head turns  - 1 x daily - 7 x weekly - 3 sets - 10 reps  ASSESSMENT:  CLINICAL IMPRESSION: Pt demos improved Rt knee strength and improved tolerance to hip IR and  extension today. She has more pain with hip abduction today with isometrics and resisted walking. Spent significant time educating pt on recommendation for aquatic exercise to improve strength while limiting pain. Pt agreeable and scheduled for aquatic PT  OBJECTIVE IMPAIRMENTS: decreased activity tolerance, decreased mobility, difficulty walking, decreased ROM, decreased strength, increased muscle spasms, impaired flexibility, and pain.     GOALS: Goals reviewed with patient? Yes  SHORT TERM GOALS: Target date: 01/09/2024   Pt will be independent with initial HEP Baseline: Goal status: MET  2.  Pt will improve FOTO to > = 45 to demo improving functional mobility Baseline: 33 on 01/11/24 Goal status: IN PROGRESS   LONG TERM GOALS: Target date: 04/30/2024    Pt will be independent with advanced HEP and plan for continued exercise in community Baseline:  Goal status: INITIAL  2.  Pt will improve FOTO to >= 50 to demo improved functional mobility Baseline:  Goal status: IN PROGRESS  3.  Pt will improve hip strength to 4+/5 bilat to improve standing and walking tolerance Baseline: see above Goal status: IN PROGRESS  4.  Pt will tolerate laying on both sides with pain <= 2/10 Baseline:  Goal status: MET  5.  Pt will improve 5 x STS to <= 15 seconds Baseline:  Goal status: IN PROGRESS - 23.11 seconds on 03/11/24  6.  Pt will report transitioning sit to stand without dizziness symptoms 75% of the time Baseline:  Goal status: MET 7.  Pt will demo decreased risk of falls with DGI >= 21/24 Baseline: 18/24 Goal status: MET - 21/24 on 03/11/24  8.  Pt will report baseline pain in hips 2-4/10 Baseline: 4-8/10 Goal status: INITIAL        PLAN:  PT FREQUENCY: 2x/week  PT DURATION: 6 weeks  PLANNED INTERVENTIONS: 97164- PT Re-evaluation, 97110-Therapeutic exercises, 97530- Therapeutic activity, [id number]- Neuromuscular re-education, 97535- Self Care, [id number]- Manual therapy,  [id number]- Aquatic Therapy, 97035- Ultrasound, [id number]- Ionotophoresis 4mg /ml Dexamethasone , Patient/Family education, Balance training, Taping, Dry Needling, Cryotherapy, and Moist heat  PLAN FOR NEXT SESSION:  manual as indicated, hip stability, hip strengthening   Tylesha Gibeault, PT 04/09/2024, 2:52 PM

## 2024-04-12 ENCOUNTER — Other Ambulatory Visit: Payer: Self-pay | Admitting: Sports Medicine

## 2024-04-12 DIAGNOSIS — M961 Postlaminectomy syndrome, not elsewhere classified: Secondary | ICD-10-CM

## 2024-04-17 ENCOUNTER — Telehealth: Payer: Self-pay

## 2024-04-17 DIAGNOSIS — G894 Chronic pain syndrome: Secondary | ICD-10-CM

## 2024-04-17 NOTE — Telephone Encounter (Signed)
 Copied from CRM [id number]. Topic: Clinical - Medical Advice >> Apr 17, 2024  1:28 PM Brittney F wrote: Reason for CRM:   Patient is in need of a order for an EMG per suggestion from her pain management doctor Dr. Merilynn Stapler. Dr Vonn Guard 's office were the ordering provider of her neck MRIs but they cannot place the ENG order and it would need to be submitted by her PCP.   The patient stated that the provider who performed this procedure previously was Myrtie Atkinson on 02/16/2022  Please assist the patient in ordering the EMG for the patient and call her back when it is in place.   Callback Number: [id number]

## 2024-04-23 ENCOUNTER — Ambulatory Visit: Payer: Self-pay | Admitting: Physical Therapy

## 2024-04-23 NOTE — Telephone Encounter (Signed)
 Patient advised.

## 2024-04-24 ENCOUNTER — Ambulatory Visit (INDEPENDENT_AMBULATORY_CARE_PROVIDER_SITE_OTHER): Admitting: Professional

## 2024-04-24 ENCOUNTER — Ambulatory Visit
Admission: RE | Admit: 2024-04-24 | Discharge: 2024-04-24 | Disposition: A | Discharge status: Home / Self Care | Source: Ambulatory Visit | Attending: Interventional Radiology | Admitting: Interventional Radiology

## 2024-04-24 DIAGNOSIS — M25561 Pain in right knee: Secondary | ICD-10-CM

## 2024-04-24 DIAGNOSIS — F411 Generalized anxiety disorder: Secondary | ICD-10-CM

## 2024-04-24 DIAGNOSIS — F332 Major depressive disorder, recurrent severe without psychotic features: Secondary | ICD-10-CM

## 2024-04-24 HISTORY — PX: IR RADIOLOGIST EVAL & MGMT: IMG5224

## 2024-04-24 NOTE — Progress Notes (Signed)
 Chief Complaint: Patient was seen in consultation today for chronic right knee pain at the request of Tyler Cubit K  Referring Physician(s): Tyreik Delahoussaye K  History of Present Illness: Heather Thompson is a 73 y.o. female with a history of degenerative osteoarthritis of multiple joints.  She has had bilateral prior total shoulder arthroplasties as well removal of a synovial cyst from her right wrist.  Over the past 10 years she has had progressive chronic bilateral knee pain worse right than left.  Knee radiographs obtained in 2023 demonstrated moderately advanced osteoarthritis on the right and mild osteoarthritis on left.    She has discussed knee arthroplasty with her orthopedic surgeon.  Unfortunately, she now lives alone has very limited social support.  She and her surgeon are concerned that she would not have help needed to allow her to recover from a knee arthroplasty.  Steroid injections have been performed in the past but have offered little benefit.    She underwent a 4 vessel geniculate artery embolization on on 04/01/24 and presents today for her follow-up.   Her recovery has been uneventful.  She has had no worsening of her pain, no problems at her access site and no skin changes or numbness.  However, she also has not experienced any substantial relief of her chronic pain symptoms either.    Past Medical History:  Diagnosis Date   Abnormality of gait 04/23/2019   Acquired hypothyroidism 07/29/2021   Allergic rhinitis due to animal hair and dander 12/02/2015   Formatting of this note might be different from the original.  [facility]  Seen by Dr. Travis Friedman 11/11/15     Plan- Received allergy shots today. Follow up 1 year.     Allergy    Anemia    Anemia    Anxiety    Arthritis of scaphoid-trapezium-trapezoid joint of right hand 09/10/2023   B12 deficiency    Back pain    Chronic fatigue 07/29/2021   Chronic fatigue syndrome    Chronic  throat clearing 03/14/2023   Clotting disorder (HCC)    Constipation    Depression with anxiety 04/07/2009   Formatting of this note might be different from the original.  well controlled on current meds, follows with psychiatrist in [place]. Had a   h/o severe depression 5 years ago, and had ECT.     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  B12 deficiency   Iron deficiency     Herniated lumbar intervertebral disc 11/12/2020   High cholesterol    History of blood clots    History of cervical spinal surgery 01/01/2018   Formatting of this note might be different from the original.  Fusion C6 and C7 per 2018 MRI report.     History of left hip replacement 01/14/2018   History of Roux-en-Y gastric bypass    Hoarseness 03/14/2023   Hyperlipidemia 08/19/2010   Hypertension    Hyponatremia 01/17/2018   Hypothyroidism    Hypothyroidism    IBS (irritable bowel syndrome) 04/06/2021   Impaired fasting glucose 08/19/2010   Incontinence 10/30/2013   Formatting of this note might be different from the original.  Formatting of this note might be different from the original. Urinary Incontinence     Infertility, female    Insomnia 04/07/2009   Formatting of this note might be different from the original.  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  Followed by [facility]  Seen by Dr. Nyle Belling 03/01/15     S/p changing thoracic spinal cord stimulator generator 08/18/14.     Lumbar spine films show spinal  cord generator in left flank area. 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  Follows with Rheumatology in [facility] of this note might be different from the original.  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  Getting iron infusion via port at hematalogy/oncology center at [facility].   Was recently hospitalized in [facility] for severe anemia     Formatting of this note might be different from the original.  on OTC b12 currently     Vitamin D  deficiency     Past Surgical History:  Procedure Laterality Date   ABDOMINAL HYSTERECTOMY     APPENDECTOMY     CARPAL TUNNEL RELEASE Bilateral 2023   with thumb joint replacement   CERVICAL FUSION     CHOLECYSTECTOMY     FOOT SURGERY Bilateral    fusion   GASTRIC BYPASS  IR EMBO ARTERIAL NOT HEMORR HEMANG INC GUIDE ROADMAPPING  04/01/2024   IR RADIOLOGIST EVAL & MGMT  03/14/2024   IR RADIOLOGIST EVAL & MGMT  04/24/2024   SPINAL CORD STIMULATOR INSERTION  07/2023   TOTAL HIP ARTHROPLASTY Bilateral    11/2016 & 05/2017    Allergies: Compazine [prochlorperazine], Phenothiazines, Prednisone, and Pregabalin  Medications: Prior to Admission medications   Medication Sig Start Date End Date Taking? Authorizing Provider  HYDROmorphone (DILAUDID) 2 MG tablet Take 2 mg by mouth in the morning and at bedtime.   Yes [provider]  buPROPion  (WELLBUTRIN  XL) 300 MG 24 hr tablet Take 1 tablet (300 mg total) by mouth daily. Take in the evening 01/16/24   Cherre Cornish, NP  celecoxib  (CELEBREX ) 200 MG capsule Take 1 to 2 capsules by mouth daily as needed for pain. 03/18/24   [name], MD  desvenlafaxine  (PRISTIQ ) 100 MG 24 hr tablet Take 1 tablet (100 mg total) by mouth daily. 03/26/24   Wray Heady, MD  FLUoxetine  (PROZAC ) 10 MG capsule Take 1 capsule (10 mg total) by mouth daily. 01/22/24   Wray Heady, MD  gabapentin  (NEURONTIN ) 600 MG tablet Take 1 tablet (600  mg total) by mouth 3 (three) times daily. 04/14/24   [name], MD  lisinopril  (ZESTRIL ) 10 MG tablet Take 1 tablet (10 mg total) by mouth daily. 10/04/23 02/21/24  Revankar, Micael Adas, MD  Magnesium 250 MG TABS Take 1 tablet by mouth daily.    [provider]  methylPREDNISolone  (MEDROL  DOSEPAK) 4 MG TBPK tablet Taper Per Instructions 03/31/24   Roxie Cord, MD  Multiple Vitamin (MULTI-VITAMIN) tablet Take 1 tablet by mouth daily.    [provider]  nystatin  (MYCOSTATIN /NYSTOP ) powder APPLY TO THE AFFECTED AREA(S) EXTERNALLY TWICE DAILY 04/07/24   Cherre Cornish, NP  oxyCODONE -acetaminophen  (PERCOCET) 7.5-325 MG tablet Take 1 tablet by mouth every 8 (eight) hours as needed for severe pain (pain score 7-10). Patient not taking: Reported on 04/24/2024 03/11/24   Ragan, Michael, FNP  propranolol  (INDERAL ) 20 MG tablet TAKE 1 TABLET(20 MG) BY MOUTH DAILY AS NEEDED 06/01/23   Cherre Cornish, NP  solifenacin  (VESICARE ) 10 MG tablet Take 1 tablet (10 mg total) by mouth daily. 02/28/24   Stoneking, Ponce Brisker., MD  temazepam  (RESTORIL ) 22.5 MG capsule TAKE ONE CAPSULE BY MOUTH AT NIGHT 03/24/24   Cherre Cornish, NP  tiZANidine  (ZANAFLEX ) 2 MG tablet TAKE 1 TABLET (2 MG TOTAL) BY MOUTH IN THE MORNING AND AT BEDTIME. 06/25/23   Breeback, Elvie Hammed, PA-C     Family History  Problem Relation Age of Onset   Hyperlipidemia Mother    Hypertension Mother    Heart attack Mother    Heart disease Mother    Sudden death Mother    Depression Mother    Anxiety disorder Mother    Obesity Mother    Anxiety disorder Father    Depression Father    Cancer Father    Hyperlipidemia Father    Hypertension Father    Lung cancer Father    Prostate cancer Father    Kidney cancer Brother    Healthy Daughter    Healthy Son     Social History   Socioeconomic History   Marital status: Widowed    Spouse name: Not on file   Number of children: 2   Years of education: 41   Highest education level:  12th grade  Occupational History    Comment: Retired.  Occupation: Retired  Tobacco Use   Smoking status: Never    Passive exposure: Past   Smokeless tobacco: Never  Vaping Use   Vaping status: Never Used  Substance and Sexual Activity   Alcohol use: Yes    Comment: rarely   Drug use: Never   Sexual activity: Not Currently  Other Topics Concern   Not on file  Social History Narrative   Lives alone. She has two children and 4 grandchildren. She stays active, enjoys doing crafts and painting.    Social Drivers of Corporate investment banker Strain: Low Risk  (08/27/2023)   Received from [facility]   Overall Financial Resource Strain (CARDIA)    Difficulty of Paying Living Expenses: Not hard at all  Food Insecurity: No Food Insecurity (08/27/2023)   Received from [facility]   Hunger Vital Sign    Worried About Running Out of Food in the Last Year: Never true    Ran Out of Food in the Last Year: Never true  Transportation Needs: No Transportation Needs (08/27/2023)   Received [facility] - Transportation    Lack of Transportation (Medical): No    Lack of Transportation (Non-Medical): No  Physical Activity: Inactive (01/09/2022)   Exercise Vital Sign    Days of Exercise per Week: 0 days    Minutes of Exercise per Session: 0 min  Stress: No Stress Concern Present (08/17/2023)   Received from [facility] - Occupational Stress Questionnaire    Feeling of Stress : Not at all  Social Connections: Unknown (03/26/2023)   Received from [facility]   Social Network    Social Network: Not on file     Review of Systems: A 12 point ROS discussed and pertinent positives are indicated in the HPI above.  All other systems are negative.  Review of Systems  Vital Signs: BP (!) 173/86 (BP Location: Left Arm, Patient Position: Sitting, Cuff Size: Normal)   Pulse 78   Temp 98.1 F (36.7 C) (Oral)   Resp  16   SpO2 98%   Advance Care Plan: The advanced care plan/surrogate decision maker was discussed at the time of visit and the patient did not wish to discuss or was not able to name a surrogate decision maker or provide an advance care plan.    Physical Exam Constitutional:      General: She is not in acute distress.    Appearance: Normal appearance. She is normal weight.  HENT:     Head: Normocephalic and atraumatic.  Eyes:     General: No scleral icterus. Cardiovascular:     Rate and Rhythm: Normal rate.  Pulmonary:     Effort: Pulmonary effort is normal.  Abdominal:     General: Abdomen is flat. There is no distension.     Palpations: Abdomen is soft.  Musculoskeletal:     Comments: Mild swelling about the right knee.  Mild TTP along the medial and lateral joint line.  No skin changes.   Skin:    General: Skin is warm and dry.     Findings: No erythema.  Neurological:     Mental Status: She is alert and oriented to person, place, and time.  Psychiatric:        Mood and Affect: Mood normal.        Behavior: Behavior normal.       Imaging: IR Radiologist Eval & Mgmt Result  Date: 04/24/2024 EXAM: ESTABLISHED PATIENT OFFICE VISIT CHIEF COMPLAINT: SEE NOTE IN EPIC HISTORY OF PRESENT ILLNESS: SEE NOTE IN EPIC REVIEW OF SYSTEMS: SEE NOTE IN EPIC PHYSICAL EXAMINATION: SEE NOTE IN EPIC ASSESSMENT AND PLAN: SEE NOTE IN EPIC Electronically Signed   By: Fernando Hoyer M.D.   On: 04/24/2024 13:54   IR EMBO ARTERIAL NOT HEMORR HEMANG INC GUIDE ROADMAPPING Result Date: 04/01/2024 INDICATION: 73 year old female with chronic severe right knee pain affecting both the lateral and medial compartments. Her pain is slightly greater laterally than medially. She presents for geniculate artery embolization to treat her severe and debilitating chronic pain. EXAM: IR EMBO ARTERIAL NOT HEMORR HEMANG INC GUIDE ROADMAPPING 1. Ultrasound-guided access right superficial femoral artery 2. Right lower  extremity arteriogram 3. Catheterization of the descending geniculate artery with arteriogram 4. Catheterization of the medial osteoarticular branch of the descending geniculate artery with arteriogram 5. Particle embolization of the medial osteoarticular branch of the descending geniculate artery 6. Catheterization of the superolateral geniculate artery with arteriogram 7. Particle embolization of the SLGA 8. Catheterization of the recurrent anterior tibial artery with arteriogram 9. Particle embolization of the recurrent anterior tibial artery 10. Catheterization of the inferolateral geniculate artery with arteriogram 11. Particle embolization of the ILGA MEDICATIONS: 1000 mg Tylenol , 10 mg Decadron  and 30 mg Toradol  administered intravenously by the Radiology nurse prior to the procedure. ANESTHESIA/SEDATION: Moderate (conscious) sedation was employed during this procedure. A total of Versed  3 mg and Fentanyl  100 mcg was administered intravenously by the Radiology nurse. Moderate Sedation Time: 95 minutes. The patient's level of consciousness and vital signs were monitored continuously by radiology nursing throughout the procedure under my direct supervision. CONTRAST:  70 mL Visipaque  270 FLUOROSCOPY: Radiation Exposure Index (as provided by the fluoroscopic device): 263 mGy Kerma COMPLICATIONS: None immediate. PROCEDURE: Informed consent was obtained from the patient following explanation of the procedure, risks, benefits and alternatives. The patient understands, agrees and consents for the procedure. All questions were addressed. A time out was performed prior to the initiation of the procedure. Maximal barrier sterile technique utilized including caps, mask, sterile gowns, sterile gloves, large sterile drape, hand hygiene, and Betadine prep. The right superficial femoral artery was interrogated with ultrasound and found to be widely patent. An image was obtained and stored for the medical record. Local  anesthesia was attained by infiltration with 1% lidocaine . Under real-time sonographic guidance, the vessel was punctured in antegrade fashion with a 21 gauge micropuncture needle. Using standard technique, the initial micro needle was exchanged over a 0.018 micro wire for a transitional 4 [place] micro sheath. The micro sheath was then exchanged over a 0.035 wire for a 4 French hydrophilic glide catheter (C2 cobra) which was advanced into the distal superficial femoral artery. A right lower extremity arteriogram was then performed. Origins of the descending geniculate artery, superolateral geniculate artery, inferolateral geniculate artery and recurrent tibial arteries were all visualized. Additionally, there is a high origin of the posterior tibial artery. Next, the C2 catheter was advanced into the origin of the descending geniculate artery. Additional arteriography was performed. The descending geniculate artery bifurcates distally into the lateral cutaneous and medial osteoarticular branches. The Progreat lambda microcatheter was successfully advanced into the medial osteoarticular branch. Arteriography was performed confirming hypervascular synovial blush. Particle embolization was then performed using 200 micron hydro pearls. Approximately 0.3 mL embolic agent administered in aliquots. Follow-up arteriography demonstrates successful vascular pruning. The catheters were brought back into the distal superficial femoral artery and further advanced  into the popliteal artery. The superolateral geniculate artery was successfully catheterized. Arteriography demonstrates additional hypervascular blush along the lateral femoral condyle. Particle embolization was performed using 200 micron hydro pearls. Follow-up arteriography demonstrates successful vascular pruning. Next, attempts were made to catheterize the inferolateral geniculate artery. Initially, this was unsuccessful. Therefore, the catheter was successfully  navigated into the anterior tibial artery and then into the recurrent anterior tibial artery. Arteriography was performed. There is a hypervascular synovial blush along the lateral tibial plateau portion of the joint. Particle embolization was performed using 200 micron hydro pearls. Follow-up arteriography demonstrates successful vascular pruning. Next, the inferolateral geniculate artery was again targeted. This time, we were successfully able to catheterize the vessel. Arteriography was performed confirming catheter location and hypervascularity along the lateral aspect of the knee joint. Particle embolization was performed using 200 micron hydro pearls. Follow-up arteriography demonstrates successful vascular pruning. The catheters were removed and hemostasis attained by manual pressure. The patient tolerated the procedure well. IMPRESSION: Successful right lower extremity arteriogram with multi-vessel geniculate artery embolization. The medial osteoarticular branch of the descending geniculate artery, the superolateral geniculate artery, the inferolateral geniculate artery in the recurrent anterior tibial artery were all successfully treated. Electronically Signed   By: Fernando Hoyer M.D.   On: 04/01/2024 13:51    Labs:  CBC: Recent Labs    01/07/24 1315  WBC 5.6  HGB 11.5*  HCT 35.9*  PLT 242    COAGS: No results for input(s): "INR", "APTT" in the last 8760 hours.  BMP: Recent Labs    08/14/23 1357  NA 136  K 4.8  CL 95*  CO2 26  GLUCOSE 101*  BUN 17  CALCIUM 9.6  CREATININE 0.68    LIVER FUNCTION TESTS: Recent Labs    08/14/23 1357  BILITOT 0.4  AST 30  ALT 29  ALKPHOS 107  PROT 6.9  ALBUMIN 4.2    TUMOR MARKERS: No results for input(s): "AFPTM", "CEA", "CA199", "CHROMGRNA" in the last 8760 hours.  Assessment and Plan:  73 year-old female with poly articular degenerative osteoarthritis and severe chronic right worse than left knee pain. She underwent 4  vessel GAE on 04/01/24.  Unfortunately, she has not experienced any improvement following GAE.  Her pain is no worse but does not seem improved.  No adverse events.   1.) We discussed referral for TKA.  2.) She will continue to be alert for signs of improvement and will reach to let me know if she gets any better.   Electronically Signed: Roxie Cord 04/24/2024, 2:06 PM   I spent a total of  15 Minutes in face to face in clinical consultation, greater than 50% of which was counseling/coordinating care for chronic right knee pain

## 2024-04-24 NOTE — Progress Notes (Unsigned)
 [facility] Counselor/Therapist Progress Note  Patient ID: Heather Thompson, MRN: [id number],    Date: 04/24/2024  Time Spent: 69 minutes [id number]  Treatment Type: Individual Therapy  Risk Assessment: Danger to Self:  No; pt commented on having pain medication that is lethal and upon further questioning she stated there is nothing that couple happen that would ever cause her to attempt suicide Self-injurious Behavior: No Danger to Others: No  Subjective: This session was held via video teletherapy. The patient consented to video teletherapy and was located in her home for this session. She is aware it is the responsibility of the patient to secure confidentiality on her end of the session. The provider was in a private home office for the duration of this session.    The patient arrived early for her Caregility appointment.  Issues addressed: 1-enjoyed time away with church family 2-brother in [facility] dying of cancer after 8 year battle 3-self-esteem 4-pride  Treatment Plan Problems: Financial Stress, Low Self-Esteem, Attention Deficit Disorder (ADD) - Adult Symptoms: Unable to concentrate or pay attention to things of low interest, even when those things are important to his/her life. Restless and fidgety; unable to be sedentary for more than a short time. Disorganized in most areas of his/her life. Starts many projects but rarely finishes any. Chronic low self-esteem. Tendency toward addictive behaviors. Indebtedness and overdue bills that exceed ability to meet monthly payments. A long-term lack of discipline in money management that has led to excessive indebtedness. A pattern of impulsive spending that does not consider the eventual financial consequences. Inability to accept compliments. Makes self-disparaging remarks; sees self as unattractive, worthless, a loser, a burden, unimportant; takes blame easily. Difficulty in saying no to others; assumes not being liked  by others. Fear of rejection by others, especially peer group. Lack of any goals for life and setting of inappropriately low goals for self. Inability to identify positive characteristics of self. Anxious and uncomfortable in social situations. Goals: Reduce impulsive actions while increasing concentration and focus on low-interest activities. Minimize ADD behavioral interference in daily life. Sustain attention and concentration for consistently longer periods of time. Achieve an inner strength to control personal impulses, cravings, and desires that directly or indirectly increase debt irresponsibly. Resolve financial crisis with a path to eliminate debt. Revise spending patterns to not exceed income. Gain a new sense of self-worth in which the substance of one's value is not attached to the capacity to do things or own things that cost money. Understand personal desires, insecurities, and anxieties that make overspending possible. Elevate self-esteem. Develop a consistent, positive self-image. Demonstrate improved self-esteem through more pride in appearance, more assertiveness, greater eye contact, and identification of positive traits in self-talk messages. Establish an inward sense of self-worth, confidence, and competence. Interact socially without undue distress or disability. Objectives target date for all objectives is 03/25/2025: Identify the current specific ADD behaviors that cause the most difficulty. List the negative consequences of the ADD problematic behavior. Learn and implement organization and planning skills. Acknowledge procrastination and the need to reduce it. Learn and implement skills to reduce procrastination. Learn and implement skills to reduce the disruptive influence of distractibility. Combine skills learned in therapy into a new daily approach to managing ADHD. Use cognitive and behavioral strategies to control the impulse to make unnecessary and  unaffordable purchases. Report instances of successful control over impulse to spend on unnecessary expenses. Increase insight into the historical and current sources of low self-esteem. Decrease the frequency of negative  self-descriptive statements and increase frequency of positive self-descriptive statements. Articulate a plan to be proactive in trying to get identified needs met. Form realistic, appropriate, and attainable goals for self in all areas of life. Take verbal responsibility for accomplishments without discounting. Identify and replace negative self-talk messages used to reinforce low self-esteem. Interventions: Assist the client in identifying the current specific behaviors that cause him/her the most difficulty functioning as part of identifying treatment targets (i.e., a functional analysis). Select situations in which the client will be increasingly challenged to apply his/her new strategies for managing ADHD, starting with situations highly likely to be successful. Teach the client meditational and self-control strategies (e.g., "stop, look, listen, and think") to delay the need for instant gratification and inhibit impulses to achieve more meaningful, longer-term goals. Teach the client to apply new problem-solving skills to planning as a first step in overcoming procrastination; for each plan, break it down into manageable time-limited steps to reduce the influence of distractibility. Assist the client in identifying positives and negatives of procrastinating toward the goal of engaging him/her in staying focused. Teach the client to use timers or other cues to remind him/her to stop tasks before he/she gets distracted in an effort to reduce the time they may be distracted and off-task (see Mastering Your Adult ADHD: Therapist Guide by Loura Rower al.). Teach the client to break down tasks into meaningful smaller units that can be completed without being distracted based on their  demonstrated attention span. Teach the client stimulus control techniques that use external structure (e.g., lists, reminders, files, daily rituals) to improve on-task behavior; remove distracting stimuli in the environment; encourage the client to reward himself/herself for successful focus and follow-through. Assess the client's typical attention span by having them do a few "boring" tasks (e.g., sorting bills, reading something uninteresting) to the point that they report distraction; use this as an approximate measure of their typical attention span. Teach the client organization and planning skills including the routine use of a calendar and daily task list. Teach the client problem-solving skills (i.e., identify problem, brainstorm all possible options, evaluate the pros and cons of each option, select best option, implement a course of action, and evaluate results) as an approach to planning; for each plan, break it down into manageable time-limited steps to reduce the influence of distractibility. Assign the client to make a list of negative consequences that he/she has experienced or that could result from a continuation of the problematic behavior; process the list (or assign "Impulsive Behavior Journal" in the Adult Psychotherapy Homework Planner by Beacher Bottoms). Help the client identify his/her distorted, negative beliefs about self and the world and replace these messages with more realistic, affirmative messages (or assign "Journal and Replace Self-Defeating Thoughts" in the Adult Psychotherapy Homework Planner by [facility] or read What to Say When You Talk to Yourself by Helmstetter). Assist the client in becoming aware of how he/she expresses or acts out negative feelings about himself/herself. Help the client reframe his/her negative assessment of himself/herself. Help the client become aware of his/her fear of rejection and its connection with past rejection or abandonment experiences; begin to  contrast past experiences of pain with present experiences of acceptance and competence. Ask the client to list accomplishments; process the integration of these into his/her self-image. Help the client analyze his/her goals to make sure they are realistic and attainable. Assign the client to make a list of goals for various areas of life and a plan for steps toward goal attainment.  Assist the client in identifying and verbalizing his/her needs, met and unmet. Reinforce with praise and encouragement all of the client's reports of resisting the urge to overspend. Urge the client to avoid all impulse buying by delaying every purchase until after 24 hours of thought and by buying only from a prewritten list of items to buy (consider assigning "Impulsive Behavior Journal" from the Adult Psychotherapy Homework Planner by Beacher Bottoms). Teach the client the cognitive strategy of asking self before each purchase: Is this purchase absolutely necessary? Can we afford this? Do we have the cash to pay for this without incurring any further debt? Role-play situations in which the client must resist external pressure to spend beyond what he/she can afford (e.g., friend's invitation to golf or go shopping, child's request for a toy), emphasizing being graciously assertive in refusing the request. Role-play situations in which the client must resist the inner temptation to spend beyond reasonable limits, emphasizing positive self-talk that compliments self for being disciplined. Objectives target date for all objectives is 03/13/2025: Identify the specific anxiety symptoms that are personally most disturbing or most contributing to impaired functioning. Learn and practice thought and behavioral control methods to minimize and control anxiety symptoms once they have begun. Identify specific thoughts that precipitate anxiety symptoms. Replace anxiety-producing thoughts with constructive thoughts. Verbalize an understanding  of the general physical and cognitive manifestations of the causes for anxiety. Identify and clarify the patterns of anxiety precipitants and consequences. Identify whether the symptoms of depression seem to be primarily related to interpersonal relationships, stressful life events or circumstances, thoughts/beliefs, or behaviors. Verbalize an understanding of the general physical and psychological effects of depression. Replace depression-promoting thoughts with mood-elevating thoughts. Keep a daily record of mood rating from 1 to 10, noting associated behaviors, activities, events, people, and thoughts. Identify specific thoughts that precipitate depressive moods. Monitor and report depressive symptoms, completing self-report assessment on a periodic basis. Describe and evaluate significant past and current interpersonal relationships. Tell memories of the deceased person, starting with positive memories. Consider possible ways of increasing involvement with others. Accept the reality of loss and tolerate the pain of intense grief. Engage in events and activities that increase level of social involvements. Identify and monitor specific pain triggers. Learn and implement somatic skills such as relaxation and/or biofeedback to reduce pain level. Learn mental coping skills and implement with somatic skills for managing acute pain. Interventions: Reinforce the client's belief system and/or refer to a spiritual leader who can support the client's faith. Explore and problem-solve with the client difficulties, fears, or barriers related to increasing social involvement. Point out to the client that engagement with others is associated with decreased feelings of loneliness, grief, depression, and hopelessness. Provide encouragement and support to the client for increased engagement in contact with others. Clarify with the client that expression of negative thoughts or feelings about the deceased is  not disloyal. Assist the client in remembering the loved one in realistic ways (i.e., includes attractions, positive traits, negative traits, conflicts, ambivalence, dependency, etc.), a memory that is neither idealized nor demonized, so that the client's relationship with the deceased person can be appraised realistically (or assign "Dear _____: A Letter to a Lost Loved One" in the Adult Psychotherapy Homework Planner, 2nd ed. by Beacher Bottoms). Assign the client to read about progressive muscle relaxation and other calming strategies in relevant books or treatment manuals (e.g., Progressive Relaxation Training by Juleen Oakland). Assign a homework exercise in which the client implements somatic pain management skills  and records the result; review and process during the treatment session. Teach the client distraction techniques (e.g., pleasant imagery, counting techniques, alternative focal points) and how to use them for relaxation skills for the management of acute episodes of pain. Ask the client to create a list of activities that are pleasurable to him/her (or assign "Identify and Schedule Pleasant Activities" in the Adult Psychotherapy Homework Planner, 2nd ed. by Beacher Bottoms); process the list, developing a plan of increasing the frequency of engaging in the selected pleasurable activities. Teach the client problem-solving skills to apply to removal of obstacles to implementing new skills; role-play implementation of these skills to obstacles. Assist the client in identifying the most effective personal stress management techniques (e.g., prayer, walking, baking, telephoning a friend), and encourage daily scheduling of these activities (or assign "Past Successful Anxiety Coping" in the Adult Psychotherapy Homework Planner, 2nd ed. by Beacher Bottoms). Teach the client the concept of finding a good match between the individual's capacities and the demands of the physical environment. If the physical  environment is too demanding for the individual's capacities (e.g., frail older adult taking care of a large house), the individual can become overwhelmed and may need to move from large home to smaller accommodations or residence for older adults). While reviewing the client's anxiety symptom chart, help him/her recognize patterns associated with anxiety symptoms: sort out precipitants from consequences, identify the most intense or frequent precipitants, and identify the consequences that help to perpetuate maladaptive patterns. Assist the client in identifying his/her current attempts to reducing anxiety (e.g., constantly telephoning family or physician, making unneeded doctor appointments or going to the emergency room), their apparent positive consequences (e.g., feeling better, getting attention), but longer-term negative consequences (e.g., physician won't return calls, friends avoid the client because of expressions of worry). Teach the client that his/her mood can be improved by increasing pleasant events and decreasing unpleasant events. Encourage the client to identify pleasant events that are desirable, but are not currently a part of his/her daily routine (see "Identify and Schedule Pleasant Activities" in the Adult Psychotherapy Homework Planner, 2nd ed. by Beacher Bottoms). Develop a one-week daily schedule with the client that increases pleasant events and decreases unpleasant events, making sure to have at least one pleasant event every day. Help the client to understand his/her strengths and weaknesses in problem-solving, to view problems as challenges and not personal deficits, and to identify and define current problem(s) that will be the focus of treatment. Help the client to identify realistic goals to address problem(s) of concern and identify major obstacles in achieving goals. Encourage the client to implement chosen solutions to current life problem(s), self-monitor and evaluate solution  implementation, and reward self for efforts. Explore the relationship of negative self-talk (e.g., "I'm just a burden to everyone"; "Everyone would be better off if I were dead") and distorted beliefs (e.g., "There's no one like me living here"; "I'd rather never leave my room than have anyone see me in a wheelchair") to depressed mood (or assign "Negative Thoughts Trigger Negative Feelings" in the Adult Psychotherapy Homework Planner, 2nd ed. by Beacher Bottoms; or "Daily Record of Dysfunctional Thoughts" in Cognitive Therapy of Depression by Bolling Bushy and Roselle Conner). Encourage the client to distinguish between all-or-nothing thinking (e.g., "Life in a nursing home can't be worth living") and genuine nuanced reflection on life's meaning and purpose (e.g., "What's giving my life meaning at this stage?"); confront the former, encourage the latter.  Diagnosis:Generalized anxiety disorder  Severe episode of recurrent major depressive disorder,  without psychotic features [facility])  Plan:  -meet again on Thursday, May 01, 2024 at 10am in person

## 2024-04-25 ENCOUNTER — Telehealth: Payer: Self-pay | Admitting: Medical-Surgical

## 2024-04-25 ENCOUNTER — Ambulatory Visit: Payer: Self-pay | Admitting: Physical Therapy

## 2024-04-25 ENCOUNTER — Encounter: Payer: Self-pay | Admitting: Physical Therapy

## 2024-04-25 DIAGNOSIS — M6281 Muscle weakness (generalized): Secondary | ICD-10-CM

## 2024-04-25 DIAGNOSIS — M25551 Pain in right hip: Secondary | ICD-10-CM

## 2024-04-25 DIAGNOSIS — M170 Bilateral primary osteoarthritis of knee: Secondary | ICD-10-CM

## 2024-04-25 NOTE — Therapy (Addendum)
 OUTPATIENT PHYSICAL THERAPY LOWER EXTREMITY TREATMENT AND DISCHARGE   Patient Name: Ani Deoliveira MRN: [id number] DOB:12/02/1950, 73 y.o., female Today's Date: 04/25/2024  END OF SESSION:  PT End of Session - 04/25/24 0927     Visit Number 25    Number of Visits 32    Date for PT Re-Evaluation 05/07/24    Authorization Type Medicare    Authorization - Visit Number 25    Progress Note Due on Visit 30    PT Start Time 0845    PT Stop Time 0930    PT Time Calculation (min) 45 min    Activity Tolerance Patient tolerated treatment well    Behavior During Therapy [facility] for tasks assessed/performed                                     Past Medical History:  Diagnosis Date   Abnormality of gait 04/23/2019   Acquired hypothyroidism 07/29/2021   Allergic rhinitis due to animal hair and dander 12/02/2015   Formatting of this note might be different from the original.  [facility]  Seen by Dr. Leita 11/11/15     Plan- Received allergy shots today. Follow up 1 year.     Allergy    Anemia    Anemia    Anxiety    Arthritis of scaphoid-trapezium-trapezoid joint of right hand 09/10/2023   B12 deficiency    Back pain    Chronic fatigue 07/29/2021   Chronic fatigue syndrome    Chronic throat clearing 03/14/2023   Clotting disorder (HCC)    Constipation    Depression with anxiety 04/07/2009   Formatting of this note might be different from the original.  well controlled on current meds, follows with psychiatrist in [place]. Had a   h/o severe depression 5 years ago, and had ECT.     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  B12  deficiency   Iron deficiency     Herniated lumbar intervertebral disc 11/12/2020   High cholesterol    History of blood clots    History of cervical spinal surgery 01/01/2018   Formatting of this note might be different from the original.  Fusion C6 and C7 per 2018 MRI report.     History of left hip replacement 01/14/2018   History of Roux-en-Y gastric bypass    Hoarseness 03/14/2023   Hyperlipidemia 08/19/2010   Hypertension    Hyponatremia 01/17/2018   Hypothyroidism    Hypothyroidism    IBS (irritable bowel syndrome) 04/06/2021   Impaired fasting glucose 08/19/2010   Incontinence 10/30/2013   Formatting of this note might be different from the original. Formatting of this note might be different from the original. Urinary Incontinence     Infertility, female    Insomnia 04/07/2009   Formatting of this note might be different from the original.  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  Followed by [facility]  Seen by Dr. Linnette 03/01/15     S/p changing thoracic spinal cord stimulator generator 08/18/14.     Lumbar spine films show spinal cord generator in left flank area. 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  Follows with Rheumatology in [facility] of this note might be different from the original.  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  Getting iron infusion via port at hematalogy/oncology center at [facility].   Was recently hospitalized in [facility] for severe anemia     Formatting of this note might be different from the original.  on OTC b12 currently     Vitamin D  deficiency    Past Surgical History:  Procedure Laterality Date   ABDOMINAL HYSTERECTOMY     APPENDECTOMY     CARPAL TUNNEL RELEASE  Bilateral 2023   with thumb joint replacement   CERVICAL FUSION     CHOLECYSTECTOMY     FOOT SURGERY Bilateral    fusion   GASTRIC BYPASS     IR EMBO ARTERIAL NOT HEMORR HEMANG INC GUIDE ROADMAPPING  04/01/2024   IR RADIOLOGIST EVAL & MGMT  03/14/2024   IR RADIOLOGIST EVAL & MGMT  04/24/2024   SPINAL CORD STIMULATOR INSERTION  07/2023   TOTAL HIP ARTHROPLASTY Bilateral    11/2016 & 05/2017   Patient Active Problem List   Diagnosis Date Noted   Palpitations 10/04/2023   Cardiac murmur 10/04/2023   Allergy  Anemia    Anxiety    B12 deficiency    Back pain    Chronic fatigue syndrome    Clotting disorder (HCC)    Constipation    Edema of both lower extremities    Fatty liver    Fibromyalgia    Gallbladder problem    High cholesterol    History of blood clots    History of Roux-en-Y gastric bypass    Hypothyroidism    Infertility, female    Joint pain    Kidney stones    Muscle disorder    Osteoarthritis    Prediabetes    Salzmann nodular degeneration    SOBOE (shortness of breath on exertion)    Swallowing difficulty    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   Arthritis of scaphoid-trapezium-trapezoid joint of right hand 09/10/2023   Urge incontinence 08/28/2023   OAB (overactive bladder) 08/28/2023   Lumbar post-laminectomy syndrome 07/06/2023   Snapping hip syndrome, 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   Other myositis, left thigh 04/24/2023   Sacroiliitis, not elsewhere classified (HCC) 04/24/2023   Dysphagia 04/17/2023   Elevated liver enzymes 04/02/2023   Elevated TSH 04/02/2023   Chronic throat clearing 03/14/2023   Globus sensation 03/14/2023   Hoarseness 03/14/2023   Muscle tension dysphonia 03/12/2023   Unspecified inflammatory spondylopathy, 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   Primary osteoarthritis of both knees 12/21/2021   Port-A-Cath in place 07/29/2021   Acquired hypothyroidism 07/29/2021   Chronic pain syndrome 07/29/2021   Vitamin B12 deficiency  07/29/2021   Vitamin D  deficiency 07/29/2021   Postmenopausal estrogen deficiency 07/29/2021   IBS (irritable bowel syndrome) 04/06/2021   Retention of urine 04/06/2021   Herniated lumbar intervertebral disc 11/12/2020   Skin sensation disturbance 11/12/2020   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   Spondylopathy, unspecified 11/12/2020   Trochanteric bursitis of both hips 11/19/2019   Low serum iron 04/23/2019   Osteomalacia 04/23/2019   Abnormality of gait 04/23/2019   History of bilateral hip arthroplasty 06/26/2018   Gastroesophageal reflux disease 06/18/2018   Hypertension 06/18/2018   Intertrigo 04/30/2018   Lentigines 04/30/2018   Hyponatremia 01/17/2018   History of cervical spinal surgery 01/01/2018   Obesity (BMI 30-39.9) 01/01/2018   SK (seborrheic keratosis) 04/18/2017   Pain management contract agreement 11/17/2016   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   Allergic rhinitis due to animal hair and dander 12/02/2015   Low back pain 03/03/2015   Incontinence 10/30/2013   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   Neutrophilic leukocytosis 08/12/2012   Thrombocytosis 08/12/2012   Osteoporosis, senile 11/15/2010   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   Hyperlipidemia 08/19/2010   Impaired fasting glucose 08/19/2010   Polyarthritis 09/28/2009   Degenerative disc disease 04/07/2009   Depression with anxiety 04/07/2009   Myopathy 04/07/2009   Insomnia 04/07/2009   Migraine 04/07/2009   Scoliosis 04/07/2009   Seasonal allergies 04/07/2009   Vitamin B12 deficiency anemia 04/07/2009    PCP: Willo Mini  REFERRING PROVIDER: Curtis MART DIAG: bilateral trochanteric bursitis, vertigo  THERAPY DIAG:  Bilateral hip pain  Muscle weakness (generalized)  Rationale for Evaluation and Treatment: Rehabilitation  ONSET DATE: 04/2023  SUBJECTIVE:   SUBJECTIVE STATEMENT: Pt states that her knee procedure did not work and she is going to have to have a total knee replacement. She  hopes to have the surgery sooner than later. She was unable to get cervical and lumbar MRI's due to issues with her spinal stimulator so she is still waiting for MRI's.  PERTINENT HISTORY: Bilat THA 2018,  fibromyalgia, OA, spinal cord stimulator, bilat TC joint fusion  Pt had bilateral hip replacements in 2018. Pt began having Lt hip and groin pain last summer that was so severe she went to ED. She was eventually diagnosed with bursitis. Since that time the pain has continued and is always there. She now has bilateral hip pain, groin pain. Hips are tender to touch. Pain increases with laying on her sides, pain with sit to stand, prolonged standing and walking. Pt has not found a way to relieve pain  Vestibular eval: pt states dizziness has been going on for quite a while. Pt states she feels room is spinning when she goes from sit to stand. She reports she also feels that sensation when she goes from supine to sit but it is not as severe. She also states she veers to the right while walking. She also states that if she is standing still she will occasionally feel like she is tipping over to the right. The sensation only lasts a few seconds. Pt states she has had a cardiac work up which ruled out cardiac cause of dizziness PAIN:  Are you having pain? Yes: NPRS scale: 5/10 currently, 8-9/10 at worst Pain location: bilat hips Lt > Rt Pain description: sore, tender Aggravating factors: sitting in recliner, laying on sides, sit to stand Relieving factors: unknown  PRECAUTIONS: pt has spinal cord stimulator  RED FLAGS: None   WEIGHT BEARING RESTRICTIONS: No  FALLS:  Has patient fallen in last 6 months? No  OCCUPATION: retired  PLOF: Independent  PATIENT GOALS: reduce pain  NEXT MD VISIT: February 2025  OBJECTIVE:  Note: Objective measures were completed at Evaluation unless otherwise noted.  DIAGNOSTIC FINDINGS: x ray performed but not read at time as eval  PATIENT SURVEYS:   FOTO 39   MUSCLE LENGTH: Hamstrings: decreased bilat Hip flexors: decreased bilat   PALPATION: TTP bilat hip flexors, ITB, glutes, HS Increased mm spasticity bilat hamstrings, glutes, quads  LOWER EXTREMITY ROM:  Active ROM Right eval Left eval Right 5/30  Hip flexion     Hip extension     Hip abduction     Hip adduction     Hip internal rotation   40  Hip external rotation 30 50   Knee flexion     Knee extension     Ankle dorsiflexion     Ankle plantarflexion     Ankle inversion     Ankle eversion      (Blank rows = not tested)  LOWER EXTREMITY MMT:  MMT Right eval Left eval Right 4/23 Left 4/23 Right 4/25 Right 04/09/24  Hip flexion 3+ 3 3- 4+  3-  Hip extension 3 3 3 4     Hip abduction 3 3 3- 4  3+  Hip adduction        Hip internal rotation        Hip external rotation 3 3+ 3 4    Knee flexion     3 3+  Knee extension     3- 3  Ankle dorsiflexion        Ankle plantarflexion     3   Ankle inversion        Ankle eversion         (Blank rows = not tested)   FUNCTIONAL TESTS:  5 times sit to stand: 18.85 seconds  VESTIBULAR ASSESSMENT:     SYMPTOM BEHAVIOR:  Subjective history: pt states dizziness has been going on for quite a  while. Pt states she feels room is spinning when she goes from sit to stand. She reports she also feels that sensation when she goes from supine to sit but it is not as severe. She also states she veers to the right while walking. She also states that if she is standing still she will occasionally feel like she is tipping over to the right. The sensation only lasts a few seconds.  Non-Vestibular symptoms: none reported  Type of dizziness: Spinning/Vertigo  Frequency: multiple times a day  Duration: seconds  Aggravating factors: Induced by position change: supine to sit, sit to stand, and walking  Relieving factors: slow movements  Progression of symptoms: unchanged  OCULOMOTOR EXAM:  Ocular Alignment: normal  Ocular  ROM: No Limitations  Spontaneous Nystagmus: absent  Gaze-Induced Nystagmus: age appropriate nystagmus at end range  Smooth Pursuits: saccades and pt reports spinning sensation when focusing on pen  Saccades: intact     VESTIBULAR - OCULAR REFLEX:   Slow VOR: Positive Bilaterally increased spinning sensation  VOR Cancellation: Comment: increased spinning sensation  Head-Impulse Test: HIT Right: positive HIT Left: negative     POSITIONAL TESTING: Right Dix-Hallpike: no nystagmus Left Dix-Hallpike: no nystagmus Right Roll Test: no nystagmus Left Roll Test: no nystagmus   MOTION SENSITIVITY:    Motion Sensitivity Quotient Intensity: 0 = none, 1 = Lightheaded, 2 = Mild, 3 = Moderate, 4 = Severe, 5 = Vomiting  Intensity  1. Sitting to supine   2. Supine to L side   3. Supine to R side   4. Supine to sitting   5. L Hallpike-Dix   6. Up from L    7. R Hallpike-Dix   8. Up from R    9. Sitting, head  tipped to L knee   10. Head up from L  knee   11. Sitting, head  tipped to R knee   12. Head up from R  knee   13. Sitting head turns x5   14.Sitting head nods x5   15. In stance, 180  turn to L    16. In stance, 180  turn to R      [facility] Adult PT Treatment:                                                DATE: 04/25/24 Therapeutic Exercise/Activity: Seated hip flexion, hip IR Sidelying hip IR, ER -- unable due to pain, hip abd --unable due to pain Sidelying gravity eliminated hip flexion -- limited by pain Attempted long sitting hip ER - limited by pain Long sitting IR isometric 10 x 5 sec squeeze Nustep L6 x 4 minutes for tolerable AAROM -- attempted to have pt use just LEs but pt with increased Rt LE pain --> L1 without UE support x 4 min   [facility] Adult PT Treatment:                                                DATE: 04/09/24 Therapeutic Exercise/Activity: Nustep L5 x 5 min Strength measurements (see above) Hip IR with ball squeeze as fulcrum 2 x 5 Bent over hip  ext 3 x 5 Rt, 2 x 10 Lt Ball on wall hip abduction -  limited by pain in Rt hip with both directions Walking with resistance laterally Lt 10#, Rt 5#   [facility] Adult PT Treatment:                                                DATE: 04/04/24 Therapeutic Exercise/Activity: Nustep L5 x 6 min for mobility Resisted walking fwd/bkwd 15#, laterally 10# Knee flex/ext on slider - pt unable to tolerate LAQ due to pain in hip and knee   [facility] Adult PT Treatment:                                                DATE: 03/25/24 Therapeutic Exercise/Activity: Seated trunk rotation to address c/o back pain Physioball roll outs all directions Hip ER/IR after manual work IT consultant 2 x 10 Hip add isometric 10 x 3 sec hold Hip flexion seated with red TB 2 x 6 Manual Therapy: STM and TPR Rt hip flexors, adductors                 PATIENT EDUCATION:  Education details: PT POC and goals, HEP Person educated: Patient Education method: Programmer, multimedia, Facilities manager, and Handouts Education comprehension: verbalized understanding and returned demonstration  HOME EXERCISE PROGRAM: Access Code: [email or web address] URL: [email or web address] Date: 03/19/2024 Prepared by: Darice Conine  Exercises - Sit to Stand  - 1 x daily - 7 x weekly - 2 sets - 10 reps - Seated Hip Flexor Stretch  - 1 x daily - 7 x weekly - 1 sets - 3 reps - 30 seconds hold - Resistance Pulldown with March  - 1 x daily - 7 x weekly - 3 sets - 10 reps - Standing Bilateral Low Shoulder Row with Anchored Resistance  - 1 x daily - 7 x weekly - 3 sets - 10 reps - Standing Isometric Hip Flexion with Ball at [employer]  - 1 x daily - 7 x weekly - 2 sets - 10 reps - Standing Isometric Hip Abduction with Ball on Wall  - 1 x daily - 7 x weekly - 2 sets - 10 reps - Seated Hip Adduction Isometrics with Ball  - 1 x daily - 7 x weekly - 2 sets - 10 reps - Supine Hip Adductor Stretch  - 1 x daily - 7 x weekly - 1 sets - 3 reps - 30 seconds hold - standing  marching with head turns  - 1 x daily - 7 x weekly - 3 sets - 10 reps  ASSESSMENT:  CLINICAL IMPRESSION: Session focused on update on impairments and attempting to find tolerable exercise to strength hips. Pt with increased pain with any hip ER today but is able to tolerate hip flexion on nustep without intolerable increase in pain  OBJECTIVE IMPAIRMENTS: decreased activity tolerance, decreased mobility, difficulty walking, decreased ROM, decreased strength, increased muscle spasms, impaired flexibility, and pain.     GOALS: Goals reviewed with patient? Yes  SHORT TERM GOALS: Target date: 01/09/2024   Pt will be independent with initial HEP Baseline: Goal status: MET  2.  Pt will improve FOTO to > = 45 to demo improving functional mobility Baseline: 33 on 01/11/24 Goal status: IN PROGRESS   LONG TERM GOALS: Target date: 04/30/2024  Pt will be independent with advanced HEP and plan for continued exercise in community Baseline:  Goal status: INITIAL  2.  Pt will improve FOTO to >= 50 to demo improved functional mobility Baseline:  Goal status: IN PROGRESS  3.  Pt will improve hip strength to 4+/5 bilat to improve standing and walking tolerance Baseline: see above Goal status: IN PROGRESS  4.  Pt will tolerate laying on both sides with pain <= 2/10 Baseline:  Goal status: MET  5.  Pt will improve 5 x STS to <= 15 seconds Baseline:  Goal status: IN PROGRESS - 23.11 seconds on 03/11/24  6.  Pt will report transitioning sit to stand without dizziness symptoms 75% of the time Baseline:  Goal status: MET 7.  Pt will demo decreased risk of falls with DGI >= 21/24 Baseline: 18/24 Goal status: MET - 21/24 on 03/11/24  8.  Pt will report baseline pain in hips 2-4/10 Baseline: 4-8/10 Goal status: INITIAL        PLAN:  PT FREQUENCY: 2x/week  PT DURATION: 6 weeks  PLANNED INTERVENTIONS: 97164- PT Re-evaluation, 97110-Therapeutic exercises, 97530- Therapeutic  activity, 97112- Neuromuscular re-education, 97535- Self Care, 02859- Manual therapy, [phone]- Aquatic Therapy, 97035- Ultrasound, 02966- Ionotophoresis 4mg /ml Dexamethasone , Patient/Family education, Balance training, Taping, Dry Needling, Cryotherapy, and Moist heat  PLAN FOR NEXT SESSION:  plan to hold PT until after MRI results manual as indicated, hip stability, hip strengthening   Santiel Topper, PT 04/25/2024, 9:28 AM   PHYSICAL THERAPY DISCHARGE SUMMARY  Visits from Start of Care: 25  Current functional level related to goals / functional outcomes: Pt made initial progress and then has regressed due to ongoing medical issues   Remaining deficits: See above   Education / Equipment: HEP   Patient agrees to discharge. Patient goals were partially met. Patient is being discharged due to a change in medical status.

## 2024-04-25 NOTE — Telephone Encounter (Signed)
 Copied from CRM [id number]. Topic: Referral - Request for Referral >> Apr 25, 2024  9:54 AM Karole Pacer C wrote: Did the patient discuss referral with their provider in the last year? Yes (If No - schedule appointment) (If Yes - send message)  Appointment offered? No  Type of order/referral and detailed reason for visit: Specialist that can complete a knee replacement  Preference of office, provider, location: Local to [facility]    If referral order, have you been seen by this specialty before? No (If Yes, this issue or another issue? When? Where?  Can we respond through MyChart? Yes

## 2024-04-25 NOTE — Telephone Encounter (Signed)
 Referral placed.

## 2024-04-25 NOTE — Addendum Note (Signed)
 Encounter addended by: Garnet Chatmon K, RT on: 04/25/2024 10:44 AM  Actions taken: Imaging Exam ended

## 2024-04-25 NOTE — Telephone Encounter (Signed)
 A referral would need to be place by pcp.

## 2024-04-25 NOTE — Addendum Note (Signed)
 Addended byCherre Cornish on: 04/25/2024 05:15 PM   Modules accepted: Orders

## 2024-04-28 NOTE — Telephone Encounter (Signed)
 Contacted the patient regarding her referral request. During the phone call the patient stated that her pain management provider suggested she has an EMG order.

## 2024-04-28 NOTE — Telephone Encounter (Signed)
 This request has been handled. No further action is required. Patient has been updated regarding her referral request.

## 2024-04-29 ENCOUNTER — Ambulatory Visit (HOSPITAL_BASED_OUTPATIENT_CLINIC_OR_DEPARTMENT_OTHER): Payer: Self-pay | Admitting: Physical Therapy

## 2024-05-01 ENCOUNTER — Ambulatory Visit: Admitting: Professional

## 2024-05-06 ENCOUNTER — Inpatient Hospital Stay: Payer: Medicare Other | Attending: Hematology & Oncology

## 2024-05-06 ENCOUNTER — Other Ambulatory Visit: Payer: Self-pay

## 2024-05-06 VITALS — BP 159/70 | HR 86 | Temp 98.1°F | Resp 16

## 2024-05-06 DIAGNOSIS — Z452 Encounter for adjustment and management of vascular access device: Secondary | ICD-10-CM | POA: Diagnosis present

## 2024-05-06 DIAGNOSIS — E611 Iron deficiency: Secondary | ICD-10-CM | POA: Diagnosis present

## 2024-05-06 DIAGNOSIS — Z95828 Presence of other vascular implants and grafts: Secondary | ICD-10-CM

## 2024-05-06 DIAGNOSIS — K912 Postsurgical malabsorption, not elsewhere classified: Secondary | ICD-10-CM | POA: Insufficient documentation

## 2024-05-06 MED ORDER — SODIUM CHLORIDE 0.9% FLUSH
10.0000 mL | INTRAVENOUS | Status: DC | PRN
  Administered 2024-05-06: 10 mL

## 2024-05-06 MED ORDER — HEPARIN SOD (PORK) LOCK FLUSH 100 UNIT/ML IV SOLN
500.0000 [IU] | Freq: Once | INTRAVENOUS | Status: AC | PRN
  Administered 2024-05-06: 500 [IU]

## 2024-05-06 NOTE — Patient Instructions (Signed)

## 2024-05-06 NOTE — Progress Notes (Signed)
 Patient presents for PAC flush. Per chart review, tip placement confirmed via 2013 imaging. PAC accessed per protocol and brisk blood return noted. Due to length of time since last imaging, order placed for patient to have CXR to confirm placement prior to next appointment. Patient verbalized understanding of POC. Discharged ambulatory with no complaints or concerns.

## 2024-05-07 ENCOUNTER — Telehealth: Payer: Self-pay | Admitting: Medical-Surgical

## 2024-05-07 NOTE — Telephone Encounter (Signed)
 Copied from CRM [id number]. Topic: Clinical - Medical Advice >> May 07, 2024  3:46 PM Kevelyn M wrote: Reason for CRM: Patient was calling to inquire about EMG referral. Please give the patient a call back [phone].

## 2024-05-07 NOTE — Telephone Encounter (Signed)
 Attempted to contact the patient. Phone keeps ringing. Per chart - EMG ordered by the provider on 04/22/24.

## 2024-05-08 ENCOUNTER — Telehealth: Payer: Self-pay | Admitting: Medical-Surgical

## 2024-05-08 ENCOUNTER — Ambulatory Visit: Admitting: Professional

## 2024-05-08 ENCOUNTER — Encounter: Payer: Self-pay | Admitting: Professional

## 2024-05-08 DIAGNOSIS — F332 Major depressive disorder, recurrent severe without psychotic features: Secondary | ICD-10-CM

## 2024-05-08 DIAGNOSIS — F411 Generalized anxiety disorder: Secondary | ICD-10-CM

## 2024-05-08 NOTE — Telephone Encounter (Signed)
 Heather Thompson, patient is requesting a copy of Gene Sight testing report done at March 2025. Patient is being referred to another psychiatry provider for med review. Please advise.   CB:[phone]

## 2024-05-08 NOTE — Progress Notes (Signed)
 [facility] Counselor/Therapist Progress Note  Patient ID: Heather Thompson, MRN: [id number],    Date: 05/08/2024  Time Spent: 65 minutes 1102-1207pm  Treatment Type: Individual Therapy  Risk Assessment: Danger to Self:  No Self-injurious Behavior: No Danger to Others: No  Subjective: The patient arrived on time for her in person appointment.  Issues addressed: 1-brother Steven's death a-pt was in [place] last week and was able to see her brother prior to his death b-she commented that he was so bloated that she wished she had not seen him c-he did wake up at points to acknowledge her and her sister's presence d-his gf placed him in a hospice home the say day pt and sister visited without their knowledge or consent -apparently the gf had Landon Pinion sign paperwork giving her the authority to make decisions -pt was to have gone back to his home to visit later in the day but gf did not answer her calls -pt found out her brother died when hospice home called her the next day e-pt upset that brother is still in a freezer because no one will provide a copy of the death certificate for him to be buried at [facility] -reminded pt she only had control over herself and not what other people or systems do 2-mood a-pt depressed and missing family -feeling like she was thrown away by her family -pt reviewed text messages with daughter and daughter sounds supportive -pt carries guilt for things her daughter had to see growing up when pt was very depressed 3-depression has never improved -needs medication reevaluation to assist pt in having improved quality of life  Treatment Plan Problems: Financial Stress, Low Self-Esteem, Attention Deficit Disorder (ADD) - Adult Symptoms: Unable to concentrate or pay attention to things of low interest, even when those things are important to his/her life. Restless and fidgety; unable to be sedentary for more than a short  time. Disorganized in most areas of his/her life. Starts many projects but rarely finishes any. Chronic low self-esteem. Tendency toward addictive behaviors. Indebtedness and overdue bills that exceed ability to meet monthly payments. A long-term lack of discipline in money management that has led to excessive indebtedness. A pattern of impulsive spending that does not consider the eventual financial consequences. Inability to accept compliments. Makes self-disparaging remarks; sees self as unattractive, worthless, a loser, a burden, unimportant; takes blame easily. Difficulty in saying no to others; assumes not being liked by others. Fear of rejection by others, especially peer group. Lack of any goals for life and setting of inappropriately low goals for self. Inability to identify positive characteristics of self. Anxious and uncomfortable in social situations. Goals: Reduce impulsive actions while increasing concentration and focus on low-interest activities. Minimize ADD behavioral interference in daily life. Sustain attention and concentration for consistently longer periods of time. Achieve an inner strength to control personal impulses, cravings, and desires that directly or indirectly increase debt irresponsibly. Resolve financial crisis with a path to eliminate debt. Revise spending patterns to not exceed income. Gain a new sense of self-worth in which the substance of one's value is not attached to the capacity to do things or own things that cost money. Understand personal desires, insecurities, and anxieties that make overspending possible. Elevate self-esteem. Develop a consistent, positive self-image. Demonstrate improved self-esteem through more pride in appearance, more assertiveness, greater eye contact, and identification of positive traits in self-talk messages. Establish an inward sense of self-worth, confidence, and competence. Interact socially without undue  distress  or disability. Objectives target date for all objectives is 03/25/2025: Identify the current specific ADD behaviors that cause the most difficulty. List the negative consequences of the ADD problematic behavior. Learn and implement organization and planning skills. Acknowledge procrastination and the need to reduce it. Learn and implement skills to reduce procrastination. Learn and implement skills to reduce the disruptive influence of distractibility. Combine skills learned in therapy into a new daily approach to managing ADHD. Use cognitive and behavioral strategies to control the impulse to make unnecessary and unaffordable purchases. Report instances of successful control over impulse to spend on unnecessary expenses. Increase insight into the historical and current sources of low self-esteem. Decrease the frequency of negative self-descriptive statements and increase frequency of positive self-descriptive statements. Articulate a plan to be proactive in trying to get identified needs met. Form realistic, appropriate, and attainable goals for self in all areas of life. Take verbal responsibility for accomplishments without discounting. Identify and replace negative self-talk messages used to reinforce low self-esteem. Interventions: Assist the client in identifying the current specific behaviors that cause him/her the most difficulty functioning as part of identifying treatment targets (i.e., a functional analysis). Select situations in which the client will be increasingly challenged to apply his/her new strategies for managing ADHD, starting with situations highly likely to be successful. Teach the client meditational and self-control strategies (e.g., stop, look, listen, and think) to delay the need for instant gratification and inhibit impulses to achieve more meaningful, longer-term goals. Teach the client to apply new problem-solving skills to planning as a first step in  overcoming procrastination; for each plan, break it down into manageable time-limited steps to reduce the influence of distractibility. Assist the client in identifying positives and negatives of procrastinating toward the goal of engaging him/her in staying focused. Teach the client to use timers or other cues to remind him/her to stop tasks before he/she gets distracted in an effort to reduce the time they may be distracted and off-task (see Mastering Your Adult ADHD: Therapist Guide by Loura Rower al.). Teach the client to break down tasks into meaningful smaller units that can be completed without being distracted based on their demonstrated attention span. Teach the client stimulus control techniques that use external structure (e.g., lists, reminders, files, daily rituals) to improve on-task behavior; remove distracting stimuli in the environment; encourage the client to reward himself/herself for successful focus and follow-through. Assess the client's typical attention span by having them do a few boring tasks (e.g., sorting bills, reading something uninteresting) to the point that they report distraction; use this as an approximate measure of their typical attention span. Teach the client organization and planning skills including the routine use of a calendar and daily task list. Teach the client problem-solving skills (i.e., identify problem, brainstorm all possible options, evaluate the pros and cons of each option, select best option, implement a course of action, and evaluate results) as an approach to planning; for each plan, break it down into manageable time-limited steps to reduce the influence of distractibility. Assign the client to make a list of negative consequences that he/she has experienced or that could result from a continuation of the problematic behavior; process the list (or assign Impulsive Behavior Journal in the Adult Psychotherapy Homework Planner by Beacher Bottoms). Help the  client identify his/her distorted, negative beliefs about self and the world and replace these messages with more realistic, affirmative messages (or assign Journal and Replace Self-Defeating Thoughts in the Adult Psychotherapy Homework Planner by [facility] or read  What to Say When You Talk to Yourself by Helmstetter). Assist the client in becoming aware of how he/she expresses or acts out negative feelings about himself/herself. Help the client reframe his/her negative assessment of himself/herself. Help the client become aware of his/her fear of rejection and its connection with past rejection or abandonment experiences; begin to contrast past experiences of pain with present experiences of acceptance and competence. Ask the client to list accomplishments; process the integration of these into his/her self-image. Help the client analyze his/her goals to make sure they are realistic and attainable. Assign the client to make a list of goals for various areas of life and a plan for steps toward goal attainment. Assist the client in identifying and verbalizing his/her needs, met and unmet. Reinforce with praise and encouragement all of the client's reports of resisting the urge to overspend. Urge the client to avoid all impulse buying by delaying every purchase until after 24 hours of thought and by buying only from a prewritten list of items to buy (consider assigning Impulsive Behavior Journal from the Adult Psychotherapy Homework Planner by Beacher Bottoms). Teach the client the cognitive strategy of asking self before each purchase: Is this purchase absolutely necessary? Can we afford this? Do we have the cash to pay for this without incurring any further debt? Role-play situations in which the client must resist external pressure to spend beyond what he/she can afford (e.g., friend's invitation to golf or go shopping, child's request for a toy), emphasizing being graciously assertive in refusing the  request. Role-play situations in which the client must resist the inner temptation to spend beyond reasonable limits, emphasizing positive self-talk that compliments self for being disciplined. Objectives target date for all objectives is 03/13/2025: Identify the specific anxiety symptoms that are personally most disturbing or most contributing to impaired functioning. Learn and practice thought and behavioral control methods to minimize and control anxiety symptoms once they have begun. Identify specific thoughts that precipitate anxiety symptoms. Replace anxiety-producing thoughts with constructive thoughts. Verbalize an understanding of the general physical and cognitive manifestations of the causes for anxiety. Identify and clarify the patterns of anxiety precipitants and consequences. Identify whether the symptoms of depression seem to be primarily related to interpersonal relationships, stressful life events or circumstances, thoughts/beliefs, or behaviors. Verbalize an understanding of the general physical and psychological effects of depression. Replace depression-promoting thoughts with mood-elevating thoughts. Keep a daily record of mood rating from 1 to 10, noting associated behaviors, activities, events, people, and thoughts. Identify specific thoughts that precipitate depressive moods. Monitor and report depressive symptoms, completing self-report assessment on a periodic basis. Describe and evaluate significant past and current interpersonal relationships. Tell memories of the deceased person, starting with positive memories. Consider possible ways of increasing involvement with others. Accept the reality of loss and tolerate the pain of intense grief. Engage in events and activities that increase level of social involvements. Identify and monitor specific pain triggers. Learn and implement somatic skills such as relaxation and/or biofeedback to reduce pain level. Learn mental  coping skills and implement with somatic skills for managing acute pain. Interventions: Reinforce the client's belief system and/or refer to a spiritual leader who can support the client's faith. Explore and problem-solve with the client difficulties, fears, or barriers related to increasing social involvement. Point out to the client that engagement with others is associated with decreased feelings of loneliness, grief, depression, and hopelessness. Provide encouragement and support to the client for increased engagement in contact with others. Clarify with  the client that expression of negative thoughts or feelings about the deceased is not disloyal. Assist the client in remembering the loved one in realistic ways (i.e., includes attractions, positive traits, negative traits, conflicts, ambivalence, dependency, etc.), a memory that is neither idealized nor demonized, so that the client's relationship with the deceased person can be appraised realistically (or assign Dear _____: A Letter to a Lost Loved One in the Adult Psychotherapy Homework Planner, 2nd ed. by Beacher Bottoms). Assign the client to read about progressive muscle relaxation and other calming strategies in relevant books or treatment manuals (e.g., Progressive Relaxation Training by Juleen Oakland). Assign a homework exercise in which the client implements somatic pain management skills and records the result; review and process during the treatment session. Teach the client distraction techniques (e.g., pleasant imagery, counting techniques, alternative focal points) and how to use them for relaxation skills for the management of acute episodes of pain. Ask the client to create a list of activities that are pleasurable to him/her (or assign Identify and Schedule Pleasant Activities in the Adult Psychotherapy Homework Planner, 2nd ed. by Beacher Bottoms); process the list, developing a plan of increasing the frequency of engaging in the  selected pleasurable activities. Teach the client problem-solving skills to apply to removal of obstacles to implementing new skills; role-play implementation of these skills to obstacles. Assist the client in identifying the most effective personal stress management techniques (e.g., prayer, walking, baking, telephoning a friend), and encourage daily scheduling of these activities (or assign Past Successful Anxiety Coping in the Adult Psychotherapy Homework Planner, 2nd ed. by Beacher Bottoms). Teach the client the concept of finding a good match between the individual's capacities and the demands of the physical environment. If the physical environment is too demanding for the individual's capacities (e.g., frail older adult taking care of a large house), the individual can become overwhelmed and may need to move from large home to smaller accommodations or residence for older adults). While reviewing the client's anxiety symptom chart, help him/her recognize patterns associated with anxiety symptoms: sort out precipitants from consequences, identify the most intense or frequent precipitants, and identify the consequences that help to perpetuate maladaptive patterns. Assist the client in identifying his/her current attempts to reducing anxiety (e.g., constantly telephoning family or physician, making unneeded doctor appointments or going to the emergency room), their apparent positive consequences (e.g., feeling better, getting attention), but longer-term negative consequences (e.g., physician won't return calls, friends avoid the client because of expressions of worry). Teach the client that his/her mood can be improved by increasing pleasant events and decreasing unpleasant events. Encourage the client to identify pleasant events that are desirable, but are not currently a part of his/her daily routine (see Identify and Schedule Pleasant Activities in the Adult Psychotherapy Homework Planner, 2nd ed. by  Beacher Bottoms). Develop a one-week daily schedule with the client that increases pleasant events and decreases unpleasant events, making sure to have at least one pleasant event every day. Help the client to understand his/her strengths and weaknesses in problem-solving, to view problems as challenges and not personal deficits, and to identify and define current problem(s) that will be the focus of treatment. Help the client to identify realistic goals to address problem(s) of concern and identify major obstacles in achieving goals. Encourage the client to implement chosen solutions to current life problem(s), self-monitor and evaluate solution implementation, and reward self for efforts. Explore the relationship of negative self-talk (e.g., I'm just a burden to everyone; Everyone would be better off if  I were dead) and distorted beliefs (e.g., There's no one like me living here; I'd rather never leave my room than have anyone see me in a wheelchair) to depressed mood (or assign Negative Thoughts Trigger Negative Feelings in the Adult Psychotherapy Homework Planner, 2nd ed. by Beacher Bottoms; or Daily Record of Dysfunctional Thoughts in Cognitive Therapy of Depression by Bolling Bushy and Roselle Conner). Encourage the client to distinguish between all-or-nothing thinking (e.g., Life in a nursing home can't be worth living) and genuine nuanced reflection on life's meaning and purpose (e.g., What's giving my life meaning at this stage?); confront the former, encourage the latter.  Diagnosis:Severe episode of recurrent major depressive disorder, without psychotic features (HCC)  Generalized anxiety disorder  Plan:  -meet again on Thursday, May 22, 2024 at 11am in person

## 2024-05-09 NOTE — Telephone Encounter (Signed)
 Spoke with [facility] t-was told that they do have the order. They will just need the patient to call to schedule an appointment.  Spoke with patient . Gave contact information for [facility] and she will call to schedule the appointment.

## 2024-05-09 NOTE — Telephone Encounter (Signed)
 Can you print her results off the site so she can have a paper copy to take to her Psychiatry provider on June 26?

## 2024-05-13 ENCOUNTER — Encounter (HOSPITAL_COMMUNITY): Payer: Self-pay | Admitting: Psychiatry

## 2024-05-13 ENCOUNTER — Ambulatory Visit (INDEPENDENT_AMBULATORY_CARE_PROVIDER_SITE_OTHER): Payer: Medicare Other | Admitting: Psychiatry

## 2024-05-13 VITALS — BP 160/90 | HR 76 | Ht 64.0 in | Wt 202.0 lb

## 2024-05-13 DIAGNOSIS — F411 Generalized anxiety disorder: Secondary | ICD-10-CM

## 2024-05-13 DIAGNOSIS — F4321 Adjustment disorder with depressed mood: Secondary | ICD-10-CM

## 2024-05-13 DIAGNOSIS — F322 Major depressive disorder, single episode, severe without psychotic features: Secondary | ICD-10-CM

## 2024-05-13 DIAGNOSIS — F332 Major depressive disorder, recurrent severe without psychotic features: Secondary | ICD-10-CM | POA: Diagnosis not present

## 2024-05-13 DIAGNOSIS — F5102 Adjustment insomnia: Secondary | ICD-10-CM | POA: Diagnosis not present

## 2024-05-13 MED ORDER — DESVENLAFAXINE SUCCINATE ER 100 MG PO TB24: 100.0000 mg | ORAL_TABLET | Freq: Every day | ORAL | 1 refills | Status: DC

## 2024-05-13 MED ORDER — FLUOXETINE HCL 10 MG PO CAPS: 10.0000 mg | ORAL_CAPSULE | Freq: Every day | ORAL | 0 refills | Status: DC
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
## 2024-05-22 ENCOUNTER — Encounter: Payer: Self-pay | Admitting: Professional

## 2024-05-22 ENCOUNTER — Ambulatory Visit (INDEPENDENT_AMBULATORY_CARE_PROVIDER_SITE_OTHER): Admitting: Professional

## 2024-05-22 DIAGNOSIS — F411 Generalized anxiety disorder: Secondary | ICD-10-CM

## 2024-05-22 DIAGNOSIS — F4321 Adjustment disorder with depressed mood: Secondary | ICD-10-CM

## 2024-05-22 DIAGNOSIS — F332 Major depressive disorder, recurrent severe without psychotic features: Secondary | ICD-10-CM | POA: Diagnosis not present

## 2024-05-22 NOTE — Progress Notes (Signed)
 [facility] Counselor/Therapist Progress Note  Patient ID: Heather Thompson, MRN: [id number],    Date: 05/22/2024  Time Spent: 56 minutes 1107-1203pm  Treatment Type: Individual Therapy  Risk Assessment: Danger to Self:  No Self-injurious Behavior: No Danger to Others: No  Subjective: The patient arrived on time for her in person appointment.  Issues addressed: 1-Steve's estate -having to deal with getting his estate handled -been in touch with sister in Social worker (not Steve's gf) 2-[employer] -pt reconnected with the [employer], an organization she belonged as a child -this has been positive for the pt and she is excited -she attended their yearly conference last weekend in [place] and had a wonderful time 3-birthday and 4th of July events -pt feels down in the dumps about another year without a birthday celebration or th of July festivities -this is pt's favorite time of year and favorite holiday -not enjoyed since husband's death -has no one to do with here in [place] -sister was going to work on attending 4th of July activities with pt however sister has to work and pt doesn't want to impact her needed income -suggested pt let friends know it is her birthday -pt thinks they should have asked her since they go out and celebrate everyone's birthday -pt feels disregarded by friend's lack of interest in her history of family -suggested pt discuss with them instead of stew over it  Treatment Plan Problems: Financial Stress, Low Self-Esteem, Attention Deficit Disorder (ADD) - Adult Symptoms: Unable to concentrate or pay attention to things of low interest, even when those things are important to his/her life. Restless and fidgety; unable to be sedentary for more than a short time. Disorganized in most areas of his/her life. Starts many projects but rarely finishes any. Chronic low self-esteem. Tendency toward addictive behaviors. Indebtedness and overdue bills that exceed ability to  meet monthly payments. A long-term lack of discipline in money management that has led to excessive indebtedness. A pattern of impulsive spending that does not consider the eventual financial consequences. Inability to accept compliments. Makes self-disparaging remarks; sees self as unattractive, worthless, a loser, a burden, unimportant; takes blame easily. Difficulty in saying no to others; assumes not being liked by others. Fear of rejection by others, especially peer group. Lack of any goals for life and setting of inappropriately low goals for self. Inability to identify positive characteristics of self. Anxious and uncomfortable in social situations. Goals: Reduce impulsive actions while increasing concentration and focus on low-interest activities. Minimize ADD behavioral interference in daily life. Sustain attention and concentration for consistently longer periods of time. Achieve an inner strength to control personal impulses, cravings, and desires that directly or indirectly increase debt irresponsibly. Resolve financial crisis with a path to eliminate debt. Revise spending patterns to not exceed income. Gain a new sense of self-worth in which the substance of one's value is not attached to the capacity to do things or own things that cost money. Understand personal desires, insecurities, and anxieties that make overspending possible. Elevate self-esteem. Develop a consistent, positive self-image. Demonstrate improved self-esteem through more pride in appearance, more assertiveness, greater eye contact, and identification of positive traits in self-talk messages. Establish an inward sense of self-worth, confidence, and competence. Interact socially without undue distress or disability. Objectives target date for all objectives is 03/25/2025: Identify the current specific ADD behaviors that cause the most difficulty. List the negative consequences of the ADD problematic  behavior. Learn and implement organization and planning skills. Acknowledge procrastination and the need  to reduce it. Learn and implement skills to reduce procrastination. Learn and implement skills to reduce the disruptive influence of distractibility. Combine skills learned in therapy into a new daily approach to managing ADHD. Use cognitive and behavioral strategies to control the impulse to make unnecessary and unaffordable purchases. Report instances of successful control over impulse to spend on unnecessary expenses. Increase insight into the historical and current sources of low self-esteem. Decrease the frequency of negative self-descriptive statements and increase frequency of positive self-descriptive statements. Articulate a plan to be proactive in trying to get identified needs met. Form realistic, appropriate, and attainable goals for self in all areas of life. Take verbal responsibility for accomplishments without discounting. Identify and replace negative self-talk messages used to reinforce low self-esteem. Interventions: Assist the client in identifying the current specific behaviors that cause him/her the most difficulty functioning as part of identifying treatment targets (i.e., a functional analysis). Select situations in which the client will be increasingly challenged to apply his/her new strategies for managing ADHD, starting with situations highly likely to be successful. Teach the client meditational and self-control strategies (e.g., stop, look, listen, and think) to delay the need for instant gratification and inhibit impulses to achieve more meaningful, longer-term goals. Teach the client to apply new problem-solving skills to planning as a first step in overcoming procrastination; for each plan, break it down into manageable time-limited steps to reduce the influence of distractibility. Assist the client in identifying positives and negatives of procrastinating  toward the goal of engaging him/her in staying focused. Teach the client to use timers or other cues to remind him/her to stop tasks before he/she gets distracted in an effort to reduce the time they may be distracted and off-task (see Mastering Your Adult ADHD: Therapist Guide by Rosevelt dunker al.). Teach the client to break down tasks into meaningful smaller units that can be completed without being distracted based on their demonstrated attention span. Teach the client stimulus control techniques that use external structure (e.g., lists, reminders, files, daily rituals) to improve on-task behavior; remove distracting stimuli in the environment; encourage the client to reward himself/herself for successful focus and follow-through. Assess the client's typical attention span by having them do a few boring tasks (e.g., sorting bills, reading something uninteresting) to the point that they report distraction; use this as an approximate measure of their typical attention span. Teach the client organization and planning skills including the routine use of a calendar and daily task list. Teach the client problem-solving skills (i.e., identify problem, brainstorm all possible options, evaluate the pros and cons of each option, select best option, implement a course of action, and evaluate results) as an approach to planning; for each plan, break it down into manageable time-limited steps to reduce the influence of distractibility. Assign the client to make a list of negative consequences that he/she has experienced or that could result from a continuation of the problematic behavior; process the list (or assign Impulsive Behavior Journal in the Adult Psychotherapy Homework Planner by Jenniffer). Help the client identify his/her distorted, negative beliefs about self and the world and replace these messages with more realistic, affirmative messages (or assign Journal and Replace Self-Defeating Thoughts in the  Adult Psychotherapy Homework Planner by Jenniffer or read What to Say When You Talk to Yourself by Helmstetter). Assist the client in becoming aware of how he/she expresses or acts out negative feelings about himself/herself. Help the client reframe his/her negative assessment of himself/herself. Help the client become aware of  his/her fear of rejection and its connection with past rejection or abandonment experiences; begin to contrast past experiences of pain with present experiences of acceptance and competence. Ask the client to list accomplishments; process the integration of these into his/her self-image. Help the client analyze his/her goals to make sure they are realistic and attainable. Assign the client to make a list of goals for various areas of life and a plan for steps toward goal attainment. Assist the client in identifying and verbalizing his/her needs, met and unmet. Reinforce with praise and encouragement all of the client's reports of resisting the urge to overspend. Urge the client to avoid all impulse buying by delaying every purchase until after 24 hours of thought and by buying only from a prewritten list of items to buy (consider assigning Impulsive Behavior Journal from the Adult Psychotherapy Homework Planner by Jenniffer). Teach the client the cognitive strategy of asking self before each purchase: Is this purchase absolutely necessary? Can we afford this? Do we have the cash to pay for this without incurring any further debt? Role-play situations in which the client must resist external pressure to spend beyond what he/she can afford (e.g., friend's invitation to golf or go shopping, child's request for a toy), emphasizing being graciously assertive in refusing the request. Role-play situations in which the client must resist the inner temptation to spend beyond reasonable limits, emphasizing positive self-talk that compliments self for being disciplined. Objectives target date  for all objectives is 03/13/2025: Identify the specific anxiety symptoms that are personally most disturbing or most contributing to impaired functioning. Learn and practice thought and behavioral control methods to minimize and control anxiety symptoms once they have begun. Identify specific thoughts that precipitate anxiety symptoms. Replace anxiety-producing thoughts with constructive thoughts. Verbalize an understanding of the general physical and cognitive manifestations of the causes for anxiety. Identify and clarify the patterns of anxiety precipitants and consequences. Identify whether the symptoms of depression seem to be primarily related to interpersonal relationships, stressful life events or circumstances, thoughts/beliefs, or behaviors. Verbalize an understanding of the general physical and psychological effects of depression. Replace depression-promoting thoughts with mood-elevating thoughts. Keep a daily record of mood rating from 1 to 10, noting associated behaviors, activities, events, people, and thoughts. Identify specific thoughts that precipitate depressive moods. Monitor and report depressive symptoms, completing self-report assessment on a periodic basis. Describe and evaluate significant past and current interpersonal relationships. Tell memories of the deceased person, starting with positive memories. Consider possible ways of increasing involvement with others. Accept the reality of loss and tolerate the pain of intense grief. Engage in events and activities that increase level of social involvements. Identify and monitor specific pain triggers. Learn and implement somatic skills such as relaxation and/or biofeedback to reduce pain level. Learn mental coping skills and implement with somatic skills for managing acute pain. Interventions: Reinforce the client's belief system and/or refer to a spiritual leader who can support the client's faith. Explore and  problem-solve with the client difficulties, fears, or barriers related to increasing social involvement. Point out to the client that engagement with others is associated with decreased feelings of loneliness, grief, depression, and hopelessness. Provide encouragement and support to the client for increased engagement in contact with others. Clarify with the client that expression of negative thoughts or feelings about the deceased is not disloyal. Assist the client in remembering the loved one in realistic ways (i.e., includes attractions, positive traits, negative traits, conflicts, ambivalence, dependency, etc.), a memory that is neither  idealized nor demonized, so that the client's relationship with the deceased person can be appraised realistically (or assign Dear _____: A Letter to a Lost Loved One in the Adult Psychotherapy Homework Planner, 2nd ed. by Jenniffer). Assign the client to read about progressive muscle relaxation and other calming strategies in relevant books or treatment manuals (e.g., Progressive Relaxation Training by Thornell armin Collier). Assign a homework exercise in which the client implements somatic pain management skills and records the result; review and process during the treatment session. Teach the client distraction techniques (e.g., pleasant imagery, counting techniques, alternative focal points) and how to use them for relaxation skills for the management of acute episodes of pain. Ask the client to create a list of activities that are pleasurable to him/her (or assign Identify and Schedule Pleasant Activities in the Adult Psychotherapy Homework Planner, 2nd ed. by Jenniffer); process the list, developing a plan of increasing the frequency of engaging in the selected pleasurable activities. Teach the client problem-solving skills to apply to removal of obstacles to implementing new skills; role-play implementation of these skills to obstacles. Assist the client in  identifying the most effective personal stress management techniques (e.g., prayer, walking, baking, telephoning a friend), and encourage daily scheduling of these activities (or assign Past Successful Anxiety Coping in the Adult Psychotherapy Homework Planner, 2nd ed. by Jenniffer). Teach the client the concept of finding a good match between the individual's capacities and the demands of the physical environment. If the physical environment is too demanding for the individual's capacities (e.g., frail older adult taking care of a large house), the individual can become overwhelmed and may need to move from large home to smaller accommodations or residence for older adults). While reviewing the client's anxiety symptom chart, help him/her recognize patterns associated with anxiety symptoms: sort out precipitants from consequences, identify the most intense or frequent precipitants, and identify the consequences that help to perpetuate maladaptive patterns. Assist the client in identifying his/her current attempts to reducing anxiety (e.g., constantly telephoning family or physician, making unneeded doctor appointments or going to the emergency room), their apparent positive consequences (e.g., feeling better, getting attention), but longer-term negative consequences (e.g., physician won't return calls, friends avoid the client because of expressions of worry). Teach the client that his/her mood can be improved by increasing pleasant events and decreasing unpleasant events. Encourage the client to identify pleasant events that are desirable, but are not currently a part of his/her daily routine (see Identify and Schedule Pleasant Activities in the Adult Psychotherapy Homework Planner, 2nd ed. by Jenniffer). Develop a one-week daily schedule with the client that increases pleasant events and decreases unpleasant events, making sure to have at least one pleasant event every day. Help the client to understand  his/her strengths and weaknesses in problem-solving, to view problems as challenges and not personal deficits, and to identify and define current problem(s) that will be the focus of treatment. Help the client to identify realistic goals to address problem(s) of concern and identify major obstacles in achieving goals. Encourage the client to implement chosen solutions to current life problem(s), self-monitor and evaluate solution implementation, and reward self for efforts. Explore the relationship of negative self-talk (e.g., I'm just a burden to everyone; Everyone would be better off if I were dead) and distorted beliefs (e.g., There's no one like me living here; I'd rather never leave my room than have anyone see me in a wheelchair) to depressed mood (or assign Negative Thoughts Trigger Negative Feelings in the Adult Psychotherapy  Homework Planner, 2nd ed. by Jenniffer; or Daily Record of Dysfunctional Thoughts in Cognitive Therapy of Depression by Almarie Candida Gentry and Shona). Encourage the client to distinguish between all-or-nothing thinking (e.g., Life in a nursing home can't be worth living) and genuine nuanced reflection on life's meaning and purpose (e.g., What's giving my life meaning at this stage?); confront the former, encourage the latter.  Diagnosis:Severe episode of recurrent major depressive disorder, without psychotic features (HCC)  Generalized anxiety disorder  Grief  Plan:  -meet again on Thursday, June 05, 2024 at 11am in person

## 2024-05-27 ENCOUNTER — Telehealth: Payer: Self-pay

## 2024-05-27 ENCOUNTER — Telehealth: Payer: Self-pay | Admitting: Medical-Surgical

## 2024-05-27 DIAGNOSIS — M961 Postlaminectomy syndrome, not elsewhere classified: Secondary | ICD-10-CM

## 2024-05-27 NOTE — Telephone Encounter (Signed)
 Copied from CRM [id number]. Topic: Referral - Status >> May 27, 2024 11:56 AM Alfonso ORN wrote: Reason for CRM: was told a month ago that [facility] sent  referral and to call to make an apointment , [facility]  the emg referral  was sent to for patient to get an emg  and the issue the place do not have the referral  Patient have a bad spine , have nerve problem going down legs and  arms from whatever going on in her spine  Please send the referral  Please call patient back so  she can get an appoinment   Patient call back # [phone]   ----------------------------------------------------------------------- From previous Reason for Contact - Referral Request: Did the patient discuss referral with their provider in the last year?   (If No - schedule appointment) (If Yes - send message)  Appointment offered?    Type of order/referral and detailed reason for visit:   Preference of office, provider, location:   If referral order, have you been seen by this specialty before?   (If Yes, this issue or another issue? When? Where?  Can we respond through MyChart?

## 2024-05-27 NOTE — Telephone Encounter (Signed)
 I believe someone must be in charge of that

## 2024-05-27 NOTE — Telephone Encounter (Signed)
 Pharmacy Patient Advocate Encounter   Received notification from CoverMyMeds that prior authorization for Temazepam  22.5MG  capsules is required/requested.   Insurance verification completed.   The patient is insured through [facility] .   Per test claim: PA required; PA submitted to above mentioned insurance via CoverMyMeds Key/confirmation #/EOC [id number] Status is pending

## 2024-05-27 NOTE — Telephone Encounter (Signed)
 Its a procedure not a referral so it does not come to my WQ to work. I wasn't aware

## 2024-05-28 NOTE — Telephone Encounter (Signed)
 Pharmacy Patient Advocate Encounter  Received notification from [facility] that Prior Authorization for Temazepam  22.5MG  capsules  has been DENIED.  Full denial letter will be uploaded to the media tab. See denial reason below.   PA #/Case ID/Reference #: [id number]

## 2024-05-29 ENCOUNTER — Ambulatory Visit: Admitting: Professional

## 2024-05-29 ENCOUNTER — Telehealth: Payer: Self-pay

## 2024-05-29 NOTE — Telephone Encounter (Signed)
 Spoke to [employer] of referrals). He mentioned that the provider can put in a AMB referral for Neurology and put in the notes patient needs an EMG study. Please advise, thanks.

## 2024-05-29 NOTE — Telephone Encounter (Signed)
 Joy here is a message for the order for the nerve conduction study.  Heather Thompson, Heather Thompson  Heather Thompson, [place] Hello  Has the patient been seen in the office for this problem? If she has not, she will have to be seen and we will need recent office notes on the problem she is having. Please let me know.  Thanks Heather

## 2024-06-02 NOTE — Addendum Note (Signed)
 Addended byBETHA WILLO MINI on: 06/02/2024 07:51 AM   Modules accepted: Orders

## 2024-06-03 ENCOUNTER — Encounter: Payer: Self-pay | Admitting: Medical-Surgical

## 2024-06-03 ENCOUNTER — Ambulatory Visit (INDEPENDENT_AMBULATORY_CARE_PROVIDER_SITE_OTHER): Admitting: Medical-Surgical

## 2024-06-03 VITALS — BP 158/76 | HR 75 | Resp 20 | Ht 64.0 in | Wt 199.0 lb

## 2024-06-03 DIAGNOSIS — Z01818 Encounter for other preprocedural examination: Secondary | ICD-10-CM | POA: Diagnosis not present

## 2024-06-03 NOTE — Progress Notes (Unsigned)
   Established Patient Office Visit  Subjective   Patient ID: Heather Thompson, female   DOB: [birth year] Age: 73 y.o. MRN: [id number]   No chief complaint on file.  HPI   Seeing Nathanel Collet- loves her Now going to see [facility] for psychiatry [facility])  Right knee replacement    Objective:    There were no vitals filed for this visit.  Physical Exam   No results found for this or any previous visit (from the past 24 hours).   {Labs (Optional):23779}  The 10-year ASCVD risk score (Arnett DK, et al., 2019) is: 28.1%   Values used to calculate the score:     Age: 73 years     Clincally relevant sex: Female     Is Non-Hispanic African American: No     Diabetic: No     Tobacco smoker: No     Systolic Blood Pressure: 160 mmHg     Is BP treated: Yes     HDL Cholesterol: 89 mg/dL     Total Cholesterol: 213 mg/dL   Assessment & Plan:     No follow-ups on file.  ___________________________________________ Heather FREDRIK Palin, DNP, APRN, FNP-BC Primary Care and Sports Medicine [facility] [place]

## 2024-06-03 NOTE — Telephone Encounter (Signed)
 See other telephone message

## 2024-06-05 ENCOUNTER — Ambulatory Visit (INDEPENDENT_AMBULATORY_CARE_PROVIDER_SITE_OTHER): Admitting: Professional

## 2024-06-05 ENCOUNTER — Encounter: Payer: Self-pay | Admitting: Professional

## 2024-06-05 DIAGNOSIS — F411 Generalized anxiety disorder: Secondary | ICD-10-CM

## 2024-06-05 DIAGNOSIS — F332 Major depressive disorder, recurrent severe without psychotic features: Secondary | ICD-10-CM

## 2024-06-05 DIAGNOSIS — F4321 Adjustment disorder with depressed mood: Secondary | ICD-10-CM

## 2024-06-05 NOTE — Progress Notes (Signed)
 [facility] Counselor/Therapist Progress Note  Patient ID: Heather Thompson, MRN: [id number],    Date: 06/05/2024  Time Spent: 54 minutes 1102-1156am  Treatment Type: Individual Therapy  Risk Assessment: Danger to Self:  No Self-injurious Behavior: No Danger to Others: No  Subjective: The patient arrived on time for her in person appointment.  Issues addressed: 1-physical a-had nerve conduction study due to severe back pain -pt had yesterday and difficulty ambulating 3-birthday and 4th of July events -bummed, sister was not able to spend day with her -Wednesday morning is her ladies support group and they were going to go to her group and have breakfast -pt declined since her sister said she was going to spent the day with her -her sister called at 94 and said she would be there at 10, then canceled plans multiple times -picked up at 1215 and went to [employer] -she only had an hour and a half and was disappointed  Treatment Plan Problems: Financial Stress, Low Self-Esteem, Attention Deficit Disorder (ADD) - Adult Symptoms: Unable to concentrate or pay attention to things of low interest, even when those things are important to his/her life. Restless and fidgety; unable to be sedentary for more than a short time. Disorganized in most areas of his/her life. Starts many projects but rarely finishes any. Chronic low self-esteem. Tendency toward addictive behaviors. Indebtedness and overdue bills that exceed ability to meet monthly payments. A long-term lack of discipline in money management that has led to excessive indebtedness. A pattern of impulsive spending that does not consider the eventual financial consequences. Inability to accept compliments. Makes self-disparaging remarks; sees self as unattractive, worthless, a loser, a burden, unimportant; takes blame easily. Difficulty in saying no to others; assumes not being liked by others. Fear of rejection by  others, especially peer group. Lack of any goals for life and setting of inappropriately low goals for self. Inability to identify positive characteristics of self. Anxious and uncomfortable in social situations. Goals: Reduce impulsive actions while increasing concentration and focus on low-interest activities. Minimize ADD behavioral interference in daily life. Sustain attention and concentration for consistently longer periods of time. Achieve an inner strength to control personal impulses, cravings, and desires that directly or indirectly increase debt irresponsibly. Resolve financial crisis with a path to eliminate debt. Revise spending patterns to not exceed income. Gain a new sense of self-worth in which the substance of one's value is not attached to the capacity to do things or own things that cost money. Understand personal desires, insecurities, and anxieties that make overspending possible. Elevate self-esteem. Develop a consistent, positive self-image. Demonstrate improved self-esteem through more pride in appearance, more assertiveness, greater eye contact, and identification of positive traits in self-talk messages. Establish an inward sense of self-worth, confidence, and competence. Interact socially without undue distress or disability. Objectives target date for all objectives is 03/25/2025: Identify the current specific ADD behaviors that cause the most difficulty. List the negative consequences of the ADD problematic behavior. Learn and implement organization and planning skills. Acknowledge procrastination and the need to reduce it. Learn and implement skills to reduce procrastination. Learn and implement skills to reduce the disruptive influence of distractibility. Combine skills learned in therapy into a new daily approach to managing ADHD. Use cognitive and behavioral strategies to control the impulse to make unnecessary and unaffordable purchases. Report instances  of successful control over impulse to spend on unnecessary expenses. Increase insight into the historical and current sources of low self-esteem. Decrease the frequency of  negative self-descriptive statements and increase frequency of positive self-descriptive statements. Articulate a plan to be proactive in trying to get identified needs met. Form realistic, appropriate, and attainable goals for self in all areas of life. Take verbal responsibility for accomplishments without discounting. Identify and replace negative self-talk messages used to reinforce low self-esteem. Interventions: Assist the client in identifying the current specific behaviors that cause him/her the most difficulty functioning as part of identifying treatment targets (i.e., a functional analysis). Select situations in which the client will be increasingly challenged to apply his/her new strategies for managing ADHD, starting with situations highly likely to be successful. Teach the client meditational and self-control strategies (e.g., stop, look, listen, and think) to delay the need for instant gratification and inhibit impulses to achieve more meaningful, longer-term goals. Teach the client to apply new problem-solving skills to planning as a first step in overcoming procrastination; for each plan, break it down into manageable time-limited steps to reduce the influence of distractibility. Assist the client in identifying positives and negatives of procrastinating toward the goal of engaging him/her in staying focused. Teach the client to use timers or other cues to remind him/her to stop tasks before he/she gets distracted in an effort to reduce the time they may be distracted and off-task (see Mastering Your Adult ADHD: Therapist Guide by Rosevelt dunker al.). Teach the client to break down tasks into meaningful smaller units that can be completed without being distracted based on their demonstrated attention span. Teach the  client stimulus control techniques that use external structure (e.g., lists, reminders, files, daily rituals) to improve on-task behavior; remove distracting stimuli in the environment; encourage the client to reward himself/herself for successful focus and follow-through. Assess the client's typical attention span by having them do a few boring tasks (e.g., sorting bills, reading something uninteresting) to the point that they report distraction; use this as an approximate measure of their typical attention span. Teach the client organization and planning skills including the routine use of a calendar and daily task list. Teach the client problem-solving skills (i.e., identify problem, brainstorm all possible options, evaluate the pros and cons of each option, select best option, implement a course of action, and evaluate results) as an approach to planning; for each plan, break it down into manageable time-limited steps to reduce the influence of distractibility. Assign the client to make a list of negative consequences that he/she has experienced or that could result from a continuation of the problematic behavior; process the list (or assign Impulsive Behavior Journal in the Adult Psychotherapy Homework Planner by Jenniffer). Help the client identify his/her distorted, negative beliefs about self and the world and replace these messages with more realistic, affirmative messages (or assign Journal and Replace Self-Defeating Thoughts in the Adult Psychotherapy Homework Planner by Jenniffer or read What to Say When You Talk to Yourself by Helmstetter). Assist the client in becoming aware of how he/she expresses or acts out negative feelings about himself/herself. Help the client reframe his/her negative assessment of himself/herself. Help the client become aware of his/her fear of rejection and its connection with past rejection or abandonment experiences; begin to contrast past experiences of pain with  present experiences of acceptance and competence. Ask the client to list accomplishments; process the integration of these into his/her self-image. Help the client analyze his/her goals to make sure they are realistic and attainable. Assign the client to make a list of goals for various areas of life and a plan for steps toward goal  attainment. Assist the client in identifying and verbalizing his/her needs, met and unmet. Reinforce with praise and encouragement all of the client's reports of resisting the urge to overspend. Urge the client to avoid all impulse buying by delaying every purchase until after 24 hours of thought and by buying only from a prewritten list of items to buy (consider assigning Impulsive Behavior Journal from the Adult Psychotherapy Homework Planner by Jenniffer). Teach the client the cognitive strategy of asking self before each purchase: Is this purchase absolutely necessary? Can we afford this? Do we have the cash to pay for this without incurring any further debt? Role-play situations in which the client must resist external pressure to spend beyond what he/she can afford (e.g., friend's invitation to golf or go shopping, child's request for a toy), emphasizing being graciously assertive in refusing the request. Role-play situations in which the client must resist the inner temptation to spend beyond reasonable limits, emphasizing positive self-talk that compliments self for being disciplined. Objectives target date for all objectives is 03/13/2025: Identify the specific anxiety symptoms that are personally most disturbing or most contributing to impaired functioning. Learn and practice thought and behavioral control methods to minimize and control anxiety symptoms once they have begun. Identify specific thoughts that precipitate anxiety symptoms. Replace anxiety-producing thoughts with constructive thoughts. Verbalize an understanding of the general physical and cognitive  manifestations of the causes for anxiety. Identify and clarify the patterns of anxiety precipitants and consequences. Identify whether the symptoms of depression seem to be primarily related to interpersonal relationships, stressful life events or circumstances, thoughts/beliefs, or behaviors. Verbalize an understanding of the general physical and psychological effects of depression. Replace depression-promoting thoughts with mood-elevating thoughts. Keep a daily record of mood rating from 1 to 10, noting associated behaviors, activities, events, people, and thoughts. Identify specific thoughts that precipitate depressive moods. Monitor and report depressive symptoms, completing self-report assessment on a periodic basis. Describe and evaluate significant past and current interpersonal relationships. Tell memories of the deceased person, starting with positive memories. Consider possible ways of increasing involvement with others. Accept the reality of loss and tolerate the pain of intense grief. Engage in events and activities that increase level of social involvements. Identify and monitor specific pain triggers. Learn and implement somatic skills such as relaxation and/or biofeedback to reduce pain level. Learn mental coping skills and implement with somatic skills for managing acute pain. Interventions: Reinforce the client's belief system and/or refer to a spiritual leader who can support the client's faith. Explore and problem-solve with the client difficulties, fears, or barriers related to increasing social involvement. Point out to the client that engagement with others is associated with decreased feelings of loneliness, grief, depression, and hopelessness. Provide encouragement and support to the client for increased engagement in contact with others. Clarify with the client that expression of negative thoughts or feelings about the deceased is not disloyal. Assist the client in  remembering the loved one in realistic ways (i.e., includes attractions, positive traits, negative traits, conflicts, ambivalence, dependency, etc.), a memory that is neither idealized nor demonized, so that the client's relationship with the deceased person can be appraised realistically (or assign Dear _____: A Letter to a Lost Loved One in the Adult Psychotherapy Homework Planner, 2nd ed. by Jenniffer). Assign the client to read about progressive muscle relaxation and other calming strategies in relevant books or treatment manuals (e.g., Progressive Relaxation Training by Thornell armin Collier). Assign a homework exercise in which the client implements somatic pain management  skills and records the result; review and process during the treatment session. Teach the client distraction techniques (e.g., pleasant imagery, counting techniques, alternative focal points) and how to use them for relaxation skills for the management of acute episodes of pain. Ask the client to create a list of activities that are pleasurable to him/her (or assign Identify and Schedule Pleasant Activities in the Adult Psychotherapy Homework Planner, 2nd ed. by Jenniffer); process the list, developing a plan of increasing the frequency of engaging in the selected pleasurable activities. Teach the client problem-solving skills to apply to removal of obstacles to implementing new skills; role-play implementation of these skills to obstacles. Assist the client in identifying the most effective personal stress management techniques (e.g., prayer, walking, baking, telephoning a friend), and encourage daily scheduling of these activities (or assign Past Successful Anxiety Coping in the Adult Psychotherapy Homework Planner, 2nd ed. by Jenniffer). Teach the client the concept of finding a good match between the individual's capacities and the demands of the physical environment. If the physical environment is too demanding for the  individual's capacities (e.g., frail older adult taking care of a large house), the individual can become overwhelmed and may need to move from large home to smaller accommodations or residence for older adults). While reviewing the client's anxiety symptom chart, help him/her recognize patterns associated with anxiety symptoms: sort out precipitants from consequences, identify the most intense or frequent precipitants, and identify the consequences that help to perpetuate maladaptive patterns. Assist the client in identifying his/her current attempts to reducing anxiety (e.g., constantly telephoning family or physician, making unneeded doctor appointments or going to the emergency room), their apparent positive consequences (e.g., feeling better, getting attention), but longer-term negative consequences (e.g., physician won't return calls, friends avoid the client because of expressions of worry). Teach the client that his/her mood can be improved by increasing pleasant events and decreasing unpleasant events. Encourage the client to identify pleasant events that are desirable, but are not currently a part of his/her daily routine (see Identify and Schedule Pleasant Activities in the Adult Psychotherapy Homework Planner, 2nd ed. by Jenniffer). Develop a one-week daily schedule with the client that increases pleasant events and decreases unpleasant events, making sure to have at least one pleasant event every day. Help the client to understand his/her strengths and weaknesses in problem-solving, to view problems as challenges and not personal deficits, and to identify and define current problem(s) that will be the focus of treatment. Help the client to identify realistic goals to address problem(s) of concern and identify major obstacles in achieving goals. Encourage the client to implement chosen solutions to current life problem(s), self-monitor and evaluate solution implementation, and reward self for  efforts. Explore the relationship of negative self-talk (e.g., I'm just a burden to everyone; Everyone would be better off if I were dead) and distorted beliefs (e.g., There's no one like me living here; I'd rather never leave my room than have anyone see me in a wheelchair) to depressed mood (or assign Negative Thoughts Trigger Negative Feelings in the Adult Psychotherapy Homework Planner, 2nd ed. by Jenniffer; or Daily Record of Dysfunctional Thoughts in Cognitive Therapy of Depression by Almarie Candida Gentry and Shona). Encourage the client to distinguish between all-or-nothing thinking (e.g., Life in a nursing home can't be worth living) and genuine nuanced reflection on life's meaning and purpose (e.g., What's giving my life meaning at this stage?); confront the former, encourage the latter.  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

## 2024-06-06 ENCOUNTER — Other Ambulatory Visit: Payer: Self-pay | Admitting: Sports Medicine

## 2024-06-06 DIAGNOSIS — M7061 Trochanteric bursitis, right hip: Secondary | ICD-10-CM

## 2024-06-11 ENCOUNTER — Encounter: Payer: Self-pay | Admitting: Adult Health

## 2024-06-11 ENCOUNTER — Ambulatory Visit (INDEPENDENT_AMBULATORY_CARE_PROVIDER_SITE_OTHER): Admitting: Adult Health

## 2024-06-11 VITALS — BP 179/83 | HR 115 | Ht 64.0 in | Wt 198.0 lb

## 2024-06-11 DIAGNOSIS — F331 Major depressive disorder, recurrent, moderate: Secondary | ICD-10-CM

## 2024-06-11 DIAGNOSIS — F428 Other obsessive-compulsive disorder: Secondary | ICD-10-CM

## 2024-06-11 DIAGNOSIS — F411 Generalized anxiety disorder: Secondary | ICD-10-CM | POA: Diagnosis not present

## 2024-06-11 DIAGNOSIS — G4700 Insomnia, unspecified: Secondary | ICD-10-CM | POA: Diagnosis not present

## 2024-06-11 DIAGNOSIS — F4321 Adjustment disorder with depressed mood: Secondary | ICD-10-CM

## 2024-06-11 NOTE — Telephone Encounter (Signed)
 Appointment completed 06/04/24 at 2:15pm with Dr. Garn[facility].

## 2024-06-11 NOTE — Progress Notes (Unsigned)
 [facility] MD/PA/NP Initial Note  06/12/2024 7:46 AM Heather Thompson  MRN:  [id number]  Chief Complaint:   HPI:   Patient seen today for initial psychiatric evaluation.   Describes mood today as not the best. Pleasant. Mood symptoms - reports depression, anxiety, and irritability. Reports decreased interest and motivation. Denies panic attacks. Reports some worry, over thinking and ruminating. Reports mood is variable. Reports a lifetime of chronic pain issues - surgeries - multiple medical concerns. Reports memory issues - can sometimes be going somewhere - get there - and doesn't remember how she got there. Her PCP says all her brain scans are clear. Was seen by neurologist. She reports taking current medications as prescribed, but is uncertain if they are helpful. She is willing to consider other options. Energy levels lower. Active, does not have a regular exercise routine with physical limitations.  Enjoys some usual interests and activities. Lives in a duplex. Born [place] raised [place]. Appetite adequate - has been overeating. Reports weight gain. Weight today - 198 pounds. Reports gastric bypass 27 years ago Sleeps better some nights than others. Averages 6 to 7 hours - not restful sleep. Reports morning and afternoon naps. Reports focus and concentration stable. Reports some concerns about memory - although brain scans do not indicate any concerns. Reports struggles with memory and word finding. Completing tasks. Managing aspects of household.  Denies SI or HI.  Denies AH or VH. Denies self harm.  Denies substance use. Denies alcohol use.  Previous medication trials: Trazadone - suicidal thoughts  Visit Diagnosis:    ICD-10-CM   1. Major depressive disorder, recurrent episode, moderate (HCC)  F33.1     2. Generalized anxiety disorder  F41.1     3. Insomnia, unspecified type  G47.00     4. Obsessional thoughts  F42.8     5. Grief  F43.21       Past Psychiatric History:  Reports a psychiatric admission for ECT - 25 years ago. Diagnosed with depression and anxiety.  Past Medical History:  Past Medical History:  Diagnosis Date   Abnormality of gait 04/23/2019   Acquired hypothyroidism 07/29/2021   Allergic rhinitis due to animal hair and dander 12/02/2015   Formatting of this note might be different from the original.  [facility]  Seen by Dr. Leita 11/11/15     Plan- Received allergy shots today. Follow up 1 year.     Allergy    Anemia    Anemia    Anxiety    Arthritis of scaphoid-trapezium-trapezoid joint of right hand 09/10/2023   B12 deficiency    Back pain    Chronic fatigue 07/29/2021   Chronic fatigue syndrome    Chronic throat clearing 03/14/2023   Clotting disorder (HCC)    Constipation    Depression with anxiety 04/07/2009   Formatting of this note might be different from the original.  well controlled on current meds, follows with psychiatrist in [place]. Had a   h/o severe depression 5 years ago, and had ECT.     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  B12 deficiency   Iron deficiency  Herniated lumbar intervertebral disc 11/12/2020   High cholesterol    History of blood clots    History of cervical spinal surgery 01/01/2018   Formatting of this note might be different from the original.  Fusion C6 and C7 per 2018 MRI report.     History of left hip replacement 01/14/2018   History of Roux-en-Y gastric bypass    Hoarseness 03/14/2023   Hyperlipidemia 08/19/2010   Hypertension    Hyponatremia 01/17/2018   Hypothyroidism    Hypothyroidism    IBS (irritable bowel syndrome) 04/06/2021   Impaired  fasting glucose 08/19/2010   Incontinence 10/30/2013   Formatting of this note might be different from the original. Formatting of this note might be different from the original. Urinary Incontinence     Infertility, female    Insomnia 04/07/2009   Formatting of this note might be different from the original.  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  Followed by [facility]  Seen by Dr. Linnette 03/01/15     S/p changing thoracic spinal cord stimulator generator 08/18/14.     Lumbar spine films show spinal cord generator in left flank area. 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  Follows with Rheumatology in [facility] of this note might be different from the original.  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  Getting iron infusion via port at hematalogy[facility]oncology center at [facility].   Was recently hospitalized in [facility] for severe anemia     Formatting of this note might be different from the original.  on OTC b12 currently     Vitamin D  deficiency     Past Surgical History:  Procedure Laterality Date   ABDOMINAL HYSTERECTOMY     APPENDECTOMY     CARPAL TUNNEL RELEASE Bilateral 2023   with thumb joint replacement   CERVICAL FUSION     CHOLECYSTECTOMY     FOOT SURGERY Bilateral    fusion   GASTRIC BYPASS     IR EMBO ARTERIAL NOT HEMORR HEMANG INC GUIDE ROADMAPPING  04/01/2024   IR RADIOLOGIST EVAL & MGMT  03/14/2024   IR RADIOLOGIST EVAL & MGMT  04/24/2024   SPINAL CORD STIMULATOR INSERTION  07/2023   TOTAL HIP ARTHROPLASTY Bilateral    11/2016 & 05/2017    Family Psychiatric History: Reports family history of mental illness.   Family History:  Family History  Problem Relation Age of Onset   Hyperlipidemia  Mother    Hypertension Mother    Heart attack Mother    Heart disease Mother    Sudden death Mother    Depression Mother    Anxiety disorder Mother    Obesity Mother    Anxiety disorder Father    Depression Father    Cancer Father    Hyperlipidemia Father    Hypertension Father    Lung cancer Father    Prostate cancer Father    Kidney cancer Brother    Healthy Daughter    Healthy Son     Social History:  Social History   Socioeconomic History   Marital status: Widowed    Spouse name: Not on file   Number of children: 2   Years of education: 84   Highest education level: 12th grade  Occupational History    Comment: Retired.   Occupation: Retired  Tobacco Use   Smoking status: Never    Passive exposure: Past   Smokeless tobacco: Never  Vaping Use   Vaping status: Never Used  Substance and Sexual Activity   Alcohol use: Yes    Comment: rarely   Drug use: Never   Sexual activity: Not Currently  Other Topics Concern   Not on file  Social History Narrative   Lives alone. She has two children and 4 grandchildren. She stays active, enjoys doing crafts and painting.    Social Drivers of Corporate investment banker Strain: Low Risk  (08/27/2023)   Received from [facility]   Overall Financial Resource Strain (CARDIA)    Difficulty of Paying Living Expenses: Not hard at all  Food Insecurity: No Food Insecurity (08/27/2023)   Received from [facility]   Hunger Vital Sign    Within the past 12 months, you worried that your food would run out before you got the money to buy more.: Never true    Within the past 12 months, the food you bought just didn't last and you didn't have money to get more.: Never true  Transportation Needs: No Transportation Needs (08/27/2023)   Received from [facility] - Transportation    Lack of Transportation (Medical): No    Lack of Transportation (Non-Medical): No  Physical Activity: Inactive (01/09/2022)   Exercise Vital  Sign    Days of Exercise per Week: 0 days  Minutes of Exercise per Session: 0 min  Stress: No Stress Concern Present (08/17/2023)   Received from [facility] of Occupational Health - Occupational Stress Questionnaire    Feeling of Stress : Not at all  Social Connections: Unknown (03/26/2023)   Received from [facility]   Social Network    Social Network: Not on file    Allergies:  Allergies  Allergen Reactions   Compazine [Prochlorperazine] Anaphylaxis   Phenothiazines Anaphylaxis and Swelling    Caused her throat to close     Prednisone Itching, Swelling and Rash    Facial swelling   Pregabalin Itching and Swelling    Swelling and itching of hands and feet.       Metabolic Disorder Labs: Lab Results  Component Value Date   HGBA1C 6.1 (A) 08/14/2023   HGBA1C 6.1 08/14/2023   MPG 131 12/19/2022   MPG 126 04/06/2022   No results found for: PROLACTIN Lab Results  Component Value Date   CHOL 213 (H) 02/20/2023   TRIG 106 02/20/2023   HDL 89 02/20/2023   CHOLHDL 2.4 02/20/2023   LDLCALC 104 (H) 02/20/2023   LDLCALC 111 (H) 12/19/2022   Lab Results  Component Value Date   TSH 4.550 (H) 03/27/2023   TSH 2.59 12/19/2022    Therapeutic Level Labs: No results found for: LITHIUM No results found for: VALPROATE No results found for: CBMZ  Current Medications: Current Outpatient Medications  Medication Sig Dispense Refill   buPROPion  (WELLBUTRIN  XL) 300 MG 24 hr tablet Take 1 tablet (300 mg total) by mouth daily. Take in the evening 90 tablet 0   celecoxib  (CELEBREX ) 200 MG capsule TAKE 1-2 CAPSULES BY MOUTH DAILY AS NEEDED FOR PAIN 60 capsule 2   desvenlafaxine  (PRISTIQ ) 100 MG 24 hr tablet Take 1 tablet (100 mg total) by mouth daily. 30 tablet 1   FLUoxetine  (PROZAC ) 10 MG capsule Take 1 capsule (10 mg total) by mouth daily. 90 capsule 0   gabapentin  (NEURONTIN ) 600 MG tablet Take 1 tablet (600 mg total) by mouth 3 (three) times  daily. 90 tablet 3   HYDROmorphone (DILAUDID) 2 MG tablet Take 2 mg by mouth in the morning and at bedtime.     lisinopril  (ZESTRIL ) 10 MG tablet Take 1 tablet (10 mg total) by mouth daily. 90 tablet 3   Magnesium 250 MG TABS Take 1 tablet by mouth daily.     Multiple Vitamin (MULTI-VITAMIN) tablet Take 1 tablet by mouth daily.     nystatin  (MYCOSTATIN /NYSTOP ) powder APPLY TO THE AFFECTED AREA(S) EXTERNALLY TWICE DAILY 30 g 0   propranolol  (INDERAL ) 20 MG tablet TAKE 1 TABLET(20 MG) BY MOUTH DAILY AS NEEDED (Patient taking differently: as needed. TAKE 1 TABLET(20 MG) BY MOUTH DAILY AS NEEDED) 90 tablet 0   solifenacin  (VESICARE ) 10 MG tablet Take 1 tablet (10 mg total) by mouth daily. 30 tablet 11   temazepam  (RESTORIL ) 22.5 MG capsule TAKE ONE CAPSULE BY MOUTH AT NIGHT 30 capsule 3   tiZANidine  (ZANAFLEX ) 2 MG tablet TAKE 1 TABLET (2 MG TOTAL) BY MOUTH IN THE MORNING AND AT BEDTIME. 180 tablet 1   No current facility-administered medications for this visit.    Medication Side Effects: none  Orders placed this visit:  No orders of the defined types were placed in this encounter.   Psychiatric Specialty Exam:  Review of Systems  Musculoskeletal:  Negative for gait problem.  Neurological:  Negative for tremors.  Psychiatric/Behavioral:  Please refer to HPI    There were no vitals taken for this visit.There is no height or weight on file to calculate BMI.  General Appearance: Neat and Well Groomed  Eye Contact:  Good  Speech:  Clear and Coherent and Normal Rate  Volume:  Normal  Mood:  Anxious and Depressed  Affect:  Depressed  Thought Process:  Coherent and Descriptions of Associations: Intact  Orientation:  Full (Time, Place, and Person)  Thought Content: Logical   Suicidal Thoughts:  No  Homicidal Thoughts:  No  Memory:  WNL  Judgement:  Good  Insight:  Good  Psychomotor Activity:  Normal  Concentration:  Concentration: Good and Attention Span: Good  Recall:  Good   Fund of Knowledge: Good  Language: Good  Assets:  Communication Skills Desire for Improvement Financial Resources/Insurance Housing Intimacy Leisure Time Physical Health Resilience Social Support Talents/Skills Transportation Vocational/Educational  ADL's:  Intact  Cognition: WNL  Prognosis:  Good   Screenings:  GAD-7    Flowsheet Row Office Visit from 06/03/2024 in [facility] at [facility] Office Visit from 02/13/2024 in [facility] Primary Care & [facility] Medicine at [facility] Office Visit from 01/16/2024 in [facility] Primary Care & Sports Medicine at [facility] Office Visit from 10/02/2023 in [facility] Primary Care & Sports Medicine at [facility] Office Visit from 09/17/2023 in [facility] at [facility]  Total GAD-7 Score 4 4 6 3 2    PHQ2-9    Flowsheet Row Office Visit from 06/03/2024 in [facility] Primary Care & Sports Medicine at [facility] Office Visit from 02/13/2024 in [facility] Primary Care & Sports Medicine at [place] Office Visit from 01/16/2024 in [facility] Primary Care & Sports Medicine at [facility] Office Visit from 10/02/2023 in [facility] at [facility] Office Visit from 09/17/2023 in [facility] Primary [facility] Medicine at [facility]  PHQ-2 Total Score 2 2 2 2 2   PHQ-9 Total Score 7 6 6 7 9    Flowsheet Row UC from 03/11/2024 in [facility] at [place] Most recent reading at 03/11/2024  6:18 PM ED from 04/19/2023 in [facility] Department at [facility] Most recent reading at 04/19/2023  7:13 PM UC from 04/19/2023 in [facility] at [place] Most recent reading at 04/19/2023  6:22 PM  C-SSRS RISK CATEGORY No Risk No Risk No Risk    Receiving Psychotherapy: Yes   Treatment Plan/Recommendations:    Plan:  PDMP reviewed  Pristiq  100mg  daily - continue in the morning Prozac  10mg  daily - move to evening Wellbutrin  XL 300mg  - move to morning Restoril  22.5mg  at bedtime - take at bedtime  Monitor BP - elevated at appointment today - follow up with PCP.  RTC 4 weeks  15 minutes spent dedicated to the care of this patient on the date of this encounter to include pre-visit review of records, ordering of medication, post visit documentation, and face-to-face time with the patient discussing depression, anxiety, insomnia and obsessional thoughts. Discussed continuing current medication regimen.  Patient advised to contact office with any questions, adverse effects, or acute worsening in signs and symptoms.      Heather Thompson N Estefano Victory, NP

## 2024-06-12 ENCOUNTER — Encounter: Payer: Self-pay | Admitting: Adult Health

## 2024-06-12 ENCOUNTER — Encounter: Payer: Self-pay | Admitting: Professional

## 2024-06-12 ENCOUNTER — Other Ambulatory Visit: Payer: Self-pay | Admitting: Medical-Surgical

## 2024-06-12 ENCOUNTER — Ambulatory Visit (INDEPENDENT_AMBULATORY_CARE_PROVIDER_SITE_OTHER): Admitting: Professional

## 2024-06-12 DIAGNOSIS — F4321 Adjustment disorder with depressed mood: Secondary | ICD-10-CM | POA: Diagnosis not present

## 2024-06-12 DIAGNOSIS — F331 Major depressive disorder, recurrent, moderate: Secondary | ICD-10-CM | POA: Diagnosis not present

## 2024-06-12 DIAGNOSIS — F411 Generalized anxiety disorder: Secondary | ICD-10-CM | POA: Diagnosis not present

## 2024-06-12 NOTE — Progress Notes (Signed)
 [facility] Counselor/Therapist Progress Note  Patient ID: Heather Thompson, MRN: [id number],    Date: 06/12/2024  Time Spent: 58 minutes 11-1158am  Treatment Type: Individual Therapy  Risk Assessment: Danger to Self:  No Self-injurious Behavior: No Danger to Others: No  Subjective: The patient arrived on time for her in person appointment.  Issues addressed: 1-physical a-will be having neurosurgery appt tomorrow at Dr. Morrison office -anticipating potential back surgery -if back surgery is not an option patient thinks she will be wheelchair bound and heading for a nursing home -addressed with pt catastrophizing thinking -educated on real vs hypothetical worry -pt preoccupied with health issues and concern about what is going to happen -discussed importance of living in the here and now  Treatment Plan Problems: Financial Stress, Low Self-Esteem, Attention Deficit Disorder (ADD) - Adult Symptoms: Unable to concentrate or pay attention to things of low interest, even when those things are important to his/her life. Restless and fidgety; unable to be sedentary for more than a short time. Disorganized in most areas of his/her life. Starts many projects but rarely finishes any. Chronic low self-esteem. Tendency toward addictive behaviors. Indebtedness and overdue bills that exceed ability to meet monthly payments. A long-term lack of discipline in money management that has led to excessive indebtedness. A pattern of impulsive spending that does not consider the eventual financial consequences. Inability to accept compliments. Makes self-disparaging remarks; sees self as unattractive, worthless, a loser, a burden, unimportant; takes blame easily. Difficulty in saying no to others; assumes not being liked by others. Fear of rejection by others, especially peer group. Lack of any goals for life and setting of inappropriately low goals for self. Inability to identify  positive characteristics of self. Anxious and uncomfortable in social situations. Goals: Reduce impulsive actions while increasing concentration and focus on low-interest activities. Minimize ADD behavioral interference in daily life. Sustain attention and concentration for consistently longer periods of time. Achieve an inner strength to control personal impulses, cravings, and desires that directly or indirectly increase debt irresponsibly. Resolve financial crisis with a path to eliminate debt. Revise spending patterns to not exceed income. Gain a new sense of self-worth in which the substance of one's value is not attached to the capacity to do things or own things that cost money. Understand personal desires, insecurities, and anxieties that make overspending possible. Elevate self-esteem. Develop a consistent, positive self-image. Demonstrate improved self-esteem through more pride in appearance, more assertiveness, greater eye contact, and identification of positive traits in self-talk messages. Establish an inward sense of self-worth, confidence, and competence. Interact socially without undue distress or disability. Objectives target date for all objectives is 03/25/2025: Identify the current specific ADD behaviors that cause the most difficulty. List the negative consequences of the ADD problematic behavior. Learn and implement organization and planning skills. Acknowledge procrastination and the need to reduce it. Learn and implement skills to reduce procrastination. Learn and implement skills to reduce the disruptive influence of distractibility. Combine skills learned in therapy into a new daily approach to managing ADHD. Use cognitive and behavioral strategies to control the impulse to make unnecessary and unaffordable purchases. Report instances of successful control over impulse to spend on unnecessary expenses. Increase insight into the historical and current sources of low  self-esteem. Decrease the frequency of negative self-descriptive statements and increase frequency of positive self-descriptive statements. Articulate a plan to be proactive in trying to get identified needs met. Form realistic, appropriate, and attainable goals for self in all areas of life.  Take verbal responsibility for accomplishments without discounting. Identify and replace negative self-talk messages used to reinforce low self-esteem. Interventions: Assist the client in identifying the current specific behaviors that cause him/her the most difficulty functioning as part of identifying treatment targets (i.e., a functional analysis). Select situations in which the client will be increasingly challenged to apply his/her new strategies for managing ADHD, starting with situations highly likely to be successful. Teach the client meditational and self-control strategies (e.g., stop, look, listen, and think) to delay the need for instant gratification and inhibit impulses to achieve more meaningful, longer-term goals. Teach the client to apply new problem-solving skills to planning as a first step in overcoming procrastination; for each plan, break it down into manageable time-limited steps to reduce the influence of distractibility. Assist the client in identifying positives and negatives of procrastinating toward the goal of engaging him/her in staying focused. Teach the client to use timers or other cues to remind him/her to stop tasks before he/she gets distracted in an effort to reduce the time they may be distracted and off-task (see Mastering Your Adult ADHD: Therapist Guide by Rosevelt dunker al.). Teach the client to break down tasks into meaningful smaller units that can be completed without being distracted based on their demonstrated attention span. Teach the client stimulus control techniques that use external structure (e.g., lists, reminders, files, daily rituals) to improve on-task  behavior; remove distracting stimuli in the environment; encourage the client to reward himself/herself for successful focus and follow-through. Assess the client's typical attention span by having them do a few boring tasks (e.g., sorting bills, reading something uninteresting) to the point that they report distraction; use this as an approximate measure of their typical attention span. Teach the client organization and planning skills including the routine use of a calendar and daily task list. Teach the client problem-solving skills (i.e., identify problem, brainstorm all possible options, evaluate the pros and cons of each option, select best option, implement a course of action, and evaluate results) as an approach to planning; for each plan, break it down into manageable time-limited steps to reduce the influence of distractibility. Assign the client to make a list of negative consequences that he/she has experienced or that could result from a continuation of the problematic behavior; process the list (or assign Impulsive Behavior Journal in the Adult Psychotherapy Homework Planner by Jenniffer). Help the client identify his/her distorted, negative beliefs about self and the world and replace these messages with more realistic, affirmative messages (or assign Journal and Replace Self-Defeating Thoughts in the Adult Psychotherapy Homework Planner by Jenniffer or read What to Say When You Talk to Yourself by Helmstetter). Assist the client in becoming aware of how he/she expresses or acts out negative feelings about himself/herself. Help the client reframe his/her negative assessment of himself/herself. Help the client become aware of his/her fear of rejection and its connection with past rejection or abandonment experiences; begin to contrast past experiences of pain with present experiences of acceptance and competence. Ask the client to list accomplishments; process the integration of these into  his/her self-image. Help the client analyze his/her goals to make sure they are realistic and attainable. Assign the client to make a list of goals for various areas of life and a plan for steps toward goal attainment. Assist the client in identifying and verbalizing his/her needs, met and unmet. Reinforce with praise and encouragement all of the client's reports of resisting the urge to overspend. Urge the client to avoid all impulse  buying by delaying every purchase until after 24 hours of thought and by buying only from a prewritten list of items to buy (consider assigning Impulsive Behavior Journal from the Adult Psychotherapy Homework Planner by Jenniffer). Teach the client the cognitive strategy of asking self before each purchase: Is this purchase absolutely necessary? Can we afford this? Do we have the cash to pay for this without incurring any further debt? Role-play situations in which the client must resist external pressure to spend beyond what he/she can afford (e.g., friend's invitation to golf or go shopping, child's request for a toy), emphasizing being graciously assertive in refusing the request. Role-play situations in which the client must resist the inner temptation to spend beyond reasonable limits, emphasizing positive self-talk that compliments self for being disciplined. Objectives target date for all objectives is 03/13/2025: Identify the specific anxiety symptoms that are personally most disturbing or most contributing to impaired functioning. Learn and practice thought and behavioral control methods to minimize and control anxiety symptoms once they have begun. Identify specific thoughts that precipitate anxiety symptoms. Replace anxiety-producing thoughts with constructive thoughts. Verbalize an understanding of the general physical and cognitive manifestations of the causes for anxiety. Identify and clarify the patterns of anxiety precipitants and consequences. Identify  whether the symptoms of depression seem to be primarily related to interpersonal relationships, stressful life events or circumstances, thoughts/beliefs, or behaviors. Verbalize an understanding of the general physical and psychological effects of depression. Replace depression-promoting thoughts with mood-elevating thoughts. Keep a daily record of mood rating from 1 to 10, noting associated behaviors, activities, events, people, and thoughts. Identify specific thoughts that precipitate depressive moods. Monitor and report depressive symptoms, completing self-report assessment on a periodic basis. Describe and evaluate significant past and current interpersonal relationships. Tell memories of the deceased person, starting with positive memories. Consider possible ways of increasing involvement with others. Accept the reality of loss and tolerate the pain of intense grief. Engage in events and activities that increase level of social involvements. Identify and monitor specific pain triggers. Learn and implement somatic skills such as relaxation and/or biofeedback to reduce pain level. Learn mental coping skills and implement with somatic skills for managing acute pain. Interventions: Reinforce the client's belief system and/or refer to a spiritual leader who can support the client's faith. Explore and problem-solve with the client difficulties, fears, or barriers related to increasing social involvement. Point out to the client that engagement with others is associated with decreased feelings of loneliness, grief, depression, and hopelessness. Provide encouragement and support to the client for increased engagement in contact with others. Clarify with the client that expression of negative thoughts or feelings about the deceased is not disloyal. Assist the client in remembering the loved one in realistic ways (i.e., includes attractions, positive traits, negative traits, conflicts, ambivalence,  dependency, etc.), a memory that is neither idealized nor demonized, so that the client's relationship with the deceased person can be appraised realistically (or assign Dear _____: A Letter to a Lost Loved One in the Adult Psychotherapy Homework Planner, 2nd ed. by Jenniffer). Assign the client to read about progressive muscle relaxation and other calming strategies in relevant books or treatment manuals (e.g., Progressive Relaxation Training by Thornell armin Collier). Assign a homework exercise in which the client implements somatic pain management skills and records the result; review and process during the treatment session. Teach the client distraction techniques (e.g., pleasant imagery, counting techniques, alternative focal points) and how to use them for relaxation skills for the management  of acute episodes of pain. Ask the client to create a list of activities that are pleasurable to him/her (or assign Identify and Schedule Pleasant Activities in the Adult Psychotherapy Homework Planner, 2nd ed. by Jenniffer); process the list, developing a plan of increasing the frequency of engaging in the selected pleasurable activities. Teach the client problem-solving skills to apply to removal of obstacles to implementing new skills; role-play implementation of these skills to obstacles. Assist the client in identifying the most effective personal stress management techniques (e.g., prayer, walking, baking, telephoning a friend), and encourage daily scheduling of these activities (or assign Past Successful Anxiety Coping in the Adult Psychotherapy Homework Planner, 2nd ed. by Jenniffer). Teach the client the concept of finding a good match between the individual's capacities and the demands of the physical environment. If the physical environment is too demanding for the individual's capacities (e.g., frail older adult taking care of a large house), the individual can become overwhelmed and may need to move  from large home to smaller accommodations or residence for older adults). While reviewing the client's anxiety symptom chart, help him/her recognize patterns associated with anxiety symptoms: sort out precipitants from consequences, identify the most intense or frequent precipitants, and identify the consequences that help to perpetuate maladaptive patterns. Assist the client in identifying his/her current attempts to reducing anxiety (e.g., constantly telephoning family or physician, making unneeded doctor appointments or going to the emergency room), their apparent positive consequences (e.g., feeling better, getting attention), but longer-term negative consequences (e.g., physician won't return calls, friends avoid the client because of expressions of worry). Teach the client that his/her mood can be improved by increasing pleasant events and decreasing unpleasant events. Encourage the client to identify pleasant events that are desirable, but are not currently a part of his/her daily routine (see Identify and Schedule Pleasant Activities in the Adult Psychotherapy Homework Planner, 2nd ed. by Jenniffer). Develop a one-week daily schedule with the client that increases pleasant events and decreases unpleasant events, making sure to have at least one pleasant event every day. Help the client to understand his/her strengths and weaknesses in problem-solving, to view problems as challenges and not personal deficits, and to identify and define current problem(s) that will be the focus of treatment. Help the client to identify realistic goals to address problem(s) of concern and identify major obstacles in achieving goals. Encourage the client to implement chosen solutions to current life problem(s), self-monitor and evaluate solution implementation, and reward self for efforts. Explore the relationship of negative self-talk (e.g., I'm just a burden to everyone; Everyone would be better off if I were  dead) and distorted beliefs (e.g., There's no one like me living here; I'd rather never leave my room than have anyone see me in a wheelchair) to depressed mood (or assign Negative Thoughts Trigger Negative Feelings in the Adult Psychotherapy Homework Planner, 2nd ed. by Jenniffer; or Daily Record of Dysfunctional Thoughts in Cognitive Therapy of Depression by Almarie Candida Gentry and Shona). Encourage the client to distinguish between all-or-nothing thinking (e.g., Life in a nursing home can't be worth living) and genuine nuanced reflection on life's meaning and purpose (e.g., What's giving my life meaning at this stage?); confront the former, encourage the latter.  Diagnosis:Major depressive disorder, recurrent episode, moderate (HCC)  Generalized anxiety disorder  Grief  Plan:  -meet again on Thursday, June 19, 2024 at 11am in person

## 2024-06-18 ENCOUNTER — Ambulatory Visit: Admitting: Physician Assistant

## 2024-06-18 ENCOUNTER — Encounter: Payer: Self-pay | Admitting: Physician Assistant

## 2024-06-18 VITALS — BP 174/64 | HR 77 | Ht 64.0 in | Wt 198.0 lb

## 2024-06-18 DIAGNOSIS — M25552 Pain in left hip: Secondary | ICD-10-CM | POA: Diagnosis not present

## 2024-06-18 DIAGNOSIS — G894 Chronic pain syndrome: Secondary | ICD-10-CM

## 2024-06-18 DIAGNOSIS — M79605 Pain in left leg: Secondary | ICD-10-CM | POA: Diagnosis not present

## 2024-06-18 DIAGNOSIS — M961 Postlaminectomy syndrome, not elsewhere classified: Secondary | ICD-10-CM

## 2024-06-18 DIAGNOSIS — G8928 Other chronic postprocedural pain: Secondary | ICD-10-CM | POA: Insufficient documentation

## 2024-06-18 DIAGNOSIS — M25551 Pain in right hip: Secondary | ICD-10-CM

## 2024-06-18 DIAGNOSIS — Z96643 Presence of artificial hip joint, bilateral: Secondary | ICD-10-CM

## 2024-06-18 NOTE — Progress Notes (Signed)
 Nerve Conduction Studies Anti Sensory Summary Table    Stim Site NR Onset (ms) Norm Onset (ms) Norm O-P Amp O-P Amp (V) Site1 Site2 Vel (m/s) Norm Vel (m/s) Dist (cm)  Right Median Anti Sensory (2nd Digit)  Wrist    2.5 <3.3 >7 14.3 Wrist 2nd Digit 56 >42 14.0  Right Radial Anti Sensory (Base 1st Digit)  Wrist    1.4 <2.2 >7 20.0 Wrist Base 1st Digit 71   10.0  Right Sup Peroneal Anti Sensory (Ant Lat Mall)  14 cm NR                    Right Sural Anti Sensory (Lat Mall)  Calf NR   <4.5 >4.0   Calf Lat Mall   >31 14.0  Right Ulnar Anti Sensory (5th Digit)  Wrist    2.4 <3.1 >10 11.6 Wrist 5th Digit 58 >45 14.0    Motor Summary Table    Stim Site NR O-P Amp (mV) Norm O-P Amp Onset (ms) Norm Onset (ms) Site1 Site2 Vel (m/s) Norm Vel (m/s) Dist (cm)  Right Median Motor (Abd Poll Brev)  Wrist    3.8 >3.8 3.0 <4.4 Elbow Wrist 55 >50 23.0  Elbow    3.9   7.2              Right Peroneal Motor (Ext Dig Brev)  Ankle    1.3 >1.1 3.4 <6.5 B Fib Ankle 46 >39 35.0  B Fib    1.2   11.0   Poplt B Fib 63 >39 10.0  Poplt    1.2   12.6              Right Tibial Motor (Abd Hall Brev)  Ankle    3.4 >1.1 3.8 <6.1 Knee Ankle 44 >39 41.0  Knee    2.3   13.2              Right Ulnar Motor (Abd Dig Minimi)  Wrist    10.3 >7.9 2.5 <3.7 B Elbow Wrist 58 >52 22.0  B Elbow    9.6   6.3   A Elbow B Elbow 64 >43 9.0  A Elbow    8.7   7.7                Comparison Summary Table    Stim Site NR Peak (ms) Norm Peak (ms) P-T Amp (V) Site1 Site2 Delta-P (ms) Norm Delta (ms)  Right Median/Ulnar Palm Comparison (Wrist - 8cm)  Median Palm    1.8 <2.5 34.4 Median Palm Ulnar Palm 0.3    Ulnar Palm    2.1 <2.5 5.6            EMG    Side Muscle Nerve Root Ins Act Fibs Fascics Activation Psw Amp Dur Poly Recrt Int Pat Comment  Right AntTibialis Dp Br Fibular L4-5 Nml Nml 0 Nml Nml Nml Nml 0 Nml Nml    Right Gastroc Tibial S1-2 Nml Nml 0 Nml Nml Nml Nml 0 Nml Nml    Right VastusMed Femoral L2-4 Nml Nml 0 Nml  Nml Nml Nml 0 Nml Nml    Right Semimembranosus Sciatic L5-S2 Nml Nml 0 Nml Nml Nml Nml 0 Nml Nml    Right L5 Parasp Rami L5 Nml Nml 0 Nml                Right Deltoid Axillary C5-6 Nml Nml 0 Nml Nml Nml Nml 0 Nml Nml  Right Triceps Radial C6-7-8 Nml Nml 0 Nml Nml Nml Nml 0 Nml Nml    Right 1stDorInt Ulnar C8-T1 Nml Nml 0 Nml Nml Nml Nml 0 Nml Nml

## 2024-06-18 NOTE — Patient Instructions (Signed)
 Dr. Lonni Reilly orthopedic surgeon.

## 2024-06-18 NOTE — Progress Notes (Signed)
   Established Patient Office Visit  Subjective   Patient ID: Heather Thompson, female    DOB: 10-Nov-1951  Age: 73 y.o. MRN: [id number]  Chief Complaint  Patient presents with   Leg Pain    Left leg pain onset since last year pt states she been seen by several doctors and she would like to know what the cause of leg is    HPI Pt is a 73 yo female who presents to the clinic to discuss persistent worsening left hip and anterior leg pain for the last year and a half. She denies any known injury but she does remember around memorial day last year feeling a pop when walking in her left hip and then her pain began. She has a long history of chronic low back pain and lumbar DDD. She has had laminectomy as well as 2 bilateral hip replacement. She sees Dr. Rosella for her low back pain and Dr. Garvin for her pain medications. She has been in and out of PT for the last 2 years and has not helped pain. She wants to know what this knew pain is. She did see Dr. ONEIDA and he gave her hip injections but they did not help. She does not know where else to go. Pain starts in left hip and radiates into left hip flexor.    ROS See HPI.    Objective:     BP (!) 174/64   Pulse 77   Ht 5' 4 (1.626 m)   Wt 198 lb (89.8 kg)   SpO2 100%   BMI 33.99 kg/m  BP Readings from Last 3 Encounters:  06/18/24 (!) 174/64  06/03/24 (!) 158/76  05/06/24 (!) 159/70   Wt Readings from Last 3 Encounters:  06/18/24 198 lb (89.8 kg)  06/03/24 199 lb (90.3 kg)  02/28/24 190 lb (86.2 kg)      Physical Exam Constitutional:      Appearance: Normal appearance. She is obese.  Musculoskeletal:     Comments: Left worse than Right pain to palpation over greater trochanter.    Neurological:     Mental Status: She is alert.       The 10-year ASCVD risk score (Arnett DK, et al., 2019) is: 32.4%    Assessment & Plan:  Heather Thompson was seen today for leg pain.  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 MRI just done of back may need hip MRI>   Will refer to orthopedic surgeon for consult She is on pain contract with Dr. Garvin   Heather Kalama, PA-C

## 2024-06-19 ENCOUNTER — Encounter: Payer: Self-pay | Admitting: Professional

## 2024-06-19 ENCOUNTER — Ambulatory Visit (INDEPENDENT_AMBULATORY_CARE_PROVIDER_SITE_OTHER): Admitting: Professional

## 2024-06-19 DIAGNOSIS — F331 Major depressive disorder, recurrent, moderate: Secondary | ICD-10-CM | POA: Diagnosis not present

## 2024-06-19 DIAGNOSIS — F411 Generalized anxiety disorder: Secondary | ICD-10-CM | POA: Diagnosis not present

## 2024-06-19 NOTE — Progress Notes (Unsigned)
 [facility] Counselor/Therapist Progress Note  Patient ID: Heather Thompson, MRN: [id number],    Date: 06/19/2024  Time Spent: 63 minutes 1102-1205pm  Treatment Type: Individual Therapy  Risk Assessment: Danger to Self:  No Self-injurious Behavior: No Danger to Others: No  Subjective: The patient arrived on time for her in person appointment.  Issues addressed: 1-physical a-pt provided a detailed report of how poorly her appointment with Dr. Morrison PA went -pt felt she was treated poorly and that the PA was not caring for her -pt was upset that when she was trying to get answers that would help her feel better that she was told repeatedly that the pain was to be expected since she is 73 years old 2-mood -pt feels frustrated at [employer] lack of compassion -pt feels frustrated that insurance will not pay for her to be in rehab but they would cover nursing home with PT -pt does not want to enter nursing home and is trying to organize people to help her for two weeks post-inpt discharge -pt feels hurt that her daughter is not going to be in  for her surgery and recovery 3-coping -reminded pt that she has option to stay in nursing home and avoid stress of gathering in-home support -attitude influences outcomes-if looking from negative perception she will find the negative  Treatment Plan Problems: Financial Stress, Low Self-Esteem, Attention Deficit Disorder (ADD) - Adult Symptoms: Unable to concentrate or pay attention to things of low interest, even when those things are important to his/her life. Restless and fidgety; unable to be sedentary for more than a short time. Disorganized in most areas of his/her life. Starts many projects but rarely finishes any. Chronic low self-esteem. Tendency toward addictive behaviors. Indebtedness and overdue bills that exceed ability to meet monthly payments. A long-term lack of discipline in money management that has led to excessive  indebtedness. A pattern of impulsive spending that does not consider the eventual financial consequences. Inability to accept compliments. Makes self-disparaging remarks; sees self as unattractive, worthless, a loser, a burden, unimportant; takes blame easily. Difficulty in saying no to others; assumes not being liked by others. Fear of rejection by others, especially peer group. Lack of any goals for life and setting of inappropriately low goals for self. Inability to identify positive characteristics of self. Anxious and uncomfortable in social situations. Goals: Reduce impulsive actions while increasing concentration and focus on low-interest activities. Minimize ADD behavioral interference in daily life. Sustain attention and concentration for consistently longer periods of time. Achieve an inner strength to control personal impulses, cravings, and desires that directly or indirectly increase debt irresponsibly. Resolve financial crisis with a path to eliminate debt. Revise spending patterns to not exceed income. Gain a new sense of self-worth in which the substance of one's value is not attached to the capacity to do things or own things that cost money. Understand personal desires, insecurities, and anxieties that make overspending possible. Elevate self-esteem. Develop a consistent, positive self-image. Demonstrate improved self-esteem through more pride in appearance, more assertiveness, greater eye contact, and identification of positive traits in self-talk messages. Establish an inward sense of self-worth, confidence, and competence. Interact socially without undue distress or disability. Objectives target date for all objectives is 03/25/2025: Identify the current specific ADD behaviors that cause the most difficulty. List the negative consequences of the ADD problematic behavior. Learn and implement organization and planning skills. Acknowledge procrastination and the need to  reduce it. Learn and implement skills to reduce procrastination. Learn and  implement skills to reduce the disruptive influence of distractibility. Combine skills learned in therapy into a new daily approach to managing ADHD. Use cognitive and behavioral strategies to control the impulse to make unnecessary and unaffordable purchases. Report instances of successful control over impulse to spend on unnecessary expenses. Increase insight into the historical and current sources of low self-esteem. Decrease the frequency of negative self-descriptive statements and increase frequency of positive self-descriptive statements. Articulate a plan to be proactive in trying to get identified needs met. Form realistic, appropriate, and attainable goals for self in all areas of life. Take verbal responsibility for accomplishments without discounting. Identify and replace negative self-talk messages used to reinforce low self-esteem. Objectives target date for all objectives is 03/13/2025: Identify the specific anxiety symptoms that are personally most disturbing or most contributing to impaired functioning. Learn and practice thought and behavioral control methods to minimize and control anxiety symptoms once they have begun. Identify specific thoughts that precipitate anxiety symptoms. Replace anxiety-producing thoughts with constructive thoughts. Verbalize an understanding of the general physical and cognitive manifestations of the causes for anxiety. Identify and clarify the patterns of anxiety precipitants and consequences. Identify whether the symptoms of depression seem to be primarily related to interpersonal relationships, stressful life events or circumstances, thoughts/beliefs, or behaviors. Verbalize an understanding of the general physical and psychological effects of depression. Replace depression-promoting thoughts with mood-elevating thoughts. Keep a daily record of mood rating from 1 to 10,  noting associated behaviors, activities, events, people, and thoughts. Identify specific thoughts that precipitate depressive moods. Monitor and report depressive symptoms, completing self-report assessment on a periodic basis. Describe and evaluate significant past and current interpersonal relationships. Tell memories of the deceased person, starting with positive memories. Consider possible ways of increasing involvement with others. Accept the reality of loss and tolerate the pain of intense grief. Engage in events and activities that increase level of social involvements. Identify and monitor specific pain triggers. Learn and implement somatic skills such as relaxation and/or biofeedback to reduce pain level. Learn mental coping skills and implement with somatic skills for managing acute pain. Interventions: Reinforce the client's belief system and/or refer to a spiritual leader who can support the client's faith. Explore and problem-solve with the client difficulties, fears, or barriers related to increasing social involvement. Point out to the client that engagement with others is associated with decreased feelings of loneliness, grief, depression, and hopelessness. Provide encouragement and support to the client for increased engagement in contact with others. Clarify with the client that expression of negative thoughts or feelings about the deceased is not disloyal. Assist the client in remembering the loved one in realistic ways (i.e., includes attractions, positive traits, negative traits, conflicts, ambivalence, dependency, etc.), a memory that is neither idealized nor demonized, so that the client's relationship with the deceased person can be appraised realistically (or assign Dear _____: A Letter to a Lost Loved One in the Adult Psychotherapy Homework Planner, 2nd ed. by Jenniffer). Assign the client to read about progressive muscle relaxation and other calming strategies in relevant  books or treatment manuals (e.g., Progressive Relaxation Training by Thornell armin Collier). Assign a homework exercise in which the client implements somatic pain management skills and records the result; review and process during the treatment session. Teach the client distraction techniques (e.g., pleasant imagery, counting techniques, alternative focal points) and how to use them for relaxation skills for the management of acute episodes of pain. Ask the client to create a list of activities that are  pleasurable to him/her (or assign Identify and Schedule Pleasant Activities in the Adult Psychotherapy Homework Planner, 2nd ed. by Jenniffer); process the list, developing a plan of increasing the frequency of engaging in the selected pleasurable activities. Teach the client problem-solving skills to apply to removal of obstacles to implementing new skills; role-play implementation of these skills to obstacles. Assist the client in identifying the most effective personal stress management techniques (e.g., prayer, walking, baking, telephoning a friend), and encourage daily scheduling of these activities (or assign Past Successful Anxiety Coping in the Adult Psychotherapy Homework Planner, 2nd ed. by Jenniffer). Teach the client the concept of finding a good match between the individual's capacities and the demands of the physical environment. If the physical environment is too demanding for the individual's capacities (e.g., frail older adult taking care of a large house), the individual can become overwhelmed and may need to move from large home to smaller accommodations or residence for older adults). While reviewing the client's anxiety symptom chart, help him/her recognize patterns associated with anxiety symptoms: sort out precipitants from consequences, identify the most intense or frequent precipitants, and identify the consequences that help to perpetuate maladaptive patterns. Assist the client in  identifying his/her current attempts to reducing anxiety (e.g., constantly telephoning family or physician, making unneeded doctor appointments or going to the emergency room), their apparent positive consequences (e.g., feeling better, getting attention), but longer-term negative consequences (e.g., physician won't return calls, friends avoid the client because of expressions of worry). Teach the client that his/her mood can be improved by increasing pleasant events and decreasing unpleasant events. Encourage the client to identify pleasant events that are desirable, but are not currently a part of his/her daily routine (see Identify and Schedule Pleasant Activities in the Adult Psychotherapy Homework Planner, 2nd ed. by Jenniffer). Develop a one-week daily schedule with the client that increases pleasant events and decreases unpleasant events, making sure to have at least one pleasant event every day. Help the client to understand his/her strengths and weaknesses in problem-solving, to view problems as challenges and not personal deficits, and to identify and define current problem(s) that will be the focus of treatment. Help the client to identify realistic goals to address problem(s) of concern and identify major obstacles in achieving goals. Encourage the client to implement chosen solutions to current life problem(s), self-monitor and evaluate solution implementation, and reward self for efforts. Explore the relationship of negative self-talk (e.g., I'm just a burden to everyone; Everyone would be better off if I were dead) and distorted beliefs (e.g., There's no one like me living here; I'd rather never leave my room than have anyone see me in a wheelchair) to depressed mood (or assign Negative Thoughts Trigger Negative Feelings in the Adult Psychotherapy Homework Planner, 2nd ed. by Jenniffer; or Daily Record of Dysfunctional Thoughts in Cognitive Therapy of Depression by Almarie Candida Gentry  and Shona). Encourage the client to distinguish between all-or-nothing thinking (e.g., Life in a nursing home can't be worth living) and genuine nuanced reflection on life's meaning and purpose (e.g., What's giving my life meaning at this stage?); confront the former, encourage the latter.  Diagnosis:Major depressive disorder, recurrent episode, moderate (HCC)  Generalized anxiety disorder  Plan:  -meet again on Thursday, July 03, 2024 at 11am in person

## 2024-06-23 ENCOUNTER — Other Ambulatory Visit: Payer: Self-pay | Admitting: Orthopedic Surgery

## 2024-06-24 ENCOUNTER — Other Ambulatory Visit: Payer: Self-pay | Admitting: Medical-Surgical

## 2024-06-24 NOTE — Progress Notes (Signed)
 Sent message, via epic in basket, requesting orders in epic from Careers adviser.

## 2024-06-25 ENCOUNTER — Encounter: Payer: Self-pay | Admitting: Family

## 2024-06-25 ENCOUNTER — Telehealth: Payer: Self-pay | Admitting: Adult Health

## 2024-06-25 ENCOUNTER — Other Ambulatory Visit: Payer: Self-pay

## 2024-06-25 ENCOUNTER — Other Ambulatory Visit (HOSPITAL_COMMUNITY): Payer: Self-pay

## 2024-06-25 DIAGNOSIS — G4700 Insomnia, unspecified: Secondary | ICD-10-CM

## 2024-06-25 MED ORDER — TEMAZEPAM 22.5 MG PO CAPS: ORAL_CAPSULE | ORAL | 0 refills | Status: DC

## 2024-06-25 NOTE — Care Plan (Signed)
 Ortho Bundle Case Management Note  Patient Details  Name: Heather Thompson MRN: [id number] Date of Birth: 04-08-1951  patient was seen in the office with PA for H&P. will discharge to home with family to assist. rolling walker ordered for home use. HHPT referral to [facility]. OPPT set up with [facility] [place]. discharge instructions discussed and questions answered. Patient and MD in agreement with plan. Choice offered.                     DME Arranged:  Vannie rolling DME Agency:  [facility]  HH Arranged:  PT HH Agency:  [facility] ([facility]  Additional Comments: Please contact me with any questions of if this plan should need to change.  Charlies Pitch,  RN,BSN,MHA,CCM  [facility] Orthopaedic Specialist  [phone] 06/25/2024, 3:28 PM

## 2024-06-25 NOTE — Telephone Encounter (Signed)
 Pt just wanted to let us  know that she had been taking bupropion  and Prozac  at the wrong times of the day. She is now taking bupropion  in the AM and Prozac  in the PM and is sleeping much better. She said she didn't need anything from us  at this time and has FU 8/25.

## 2024-06-25 NOTE — Progress Notes (Signed)
 Anesthesia Review:  PCP: Vermell Antoniette BARRE LOV 06/18/24  Cardiologist :  PPM/ ICD: Device Orders: Rep Notified:  Chest x-ray : EKG : 11/7/20924  Echo : 10/30/2023  Monitor- 10/23/23  Stress test: Cardiac Cath :   Activity level:  Sleep Study/ CPAP : Fasting Blood Sugar :      / Checks Blood Sugar -- times a day:    Blood Thinner/ Instructions /Last Dose: ASA / Instructions/ Last Dose :

## 2024-06-25 NOTE — Progress Notes (Unsigned)
 2 week supply of Temazapam 22.5mg  nightly sent to the pharmacy for an emergency supply. Please send PA as soon as possible.   ___________________________________________ Zada FREDRIK Palin, DNP, APRN, FNP-BC Primary Care and Sports Medicine [facility] [place]'

## 2024-06-25 NOTE — Telephone Encounter (Signed)
 Patient called in with an update on medication. States that she was taking medications Bupropion  and Prozac  the incorrect time of day. She was Bupropion  at night and Prozac  in the morning. She has switched the prescriptions so that she is taking Prozac  in the morning and Bupropion  at night or it may be the other way around. It wasn't clear which medication she was taking in the morning and evening. The switch has mad difference and her sleeping has improved she is better than she was. PH: [phone] Appt 8/25

## 2024-06-26 ENCOUNTER — Ambulatory Visit: Admitting: Professional

## 2024-06-26 ENCOUNTER — Ambulatory Visit (INDEPENDENT_AMBULATORY_CARE_PROVIDER_SITE_OTHER): Admitting: Professional

## 2024-06-26 ENCOUNTER — Encounter: Payer: Self-pay | Admitting: Professional

## 2024-06-26 DIAGNOSIS — F4321 Adjustment disorder with depressed mood: Secondary | ICD-10-CM

## 2024-06-26 DIAGNOSIS — F411 Generalized anxiety disorder: Secondary | ICD-10-CM | POA: Diagnosis not present

## 2024-06-26 DIAGNOSIS — F331 Major depressive disorder, recurrent, moderate: Secondary | ICD-10-CM | POA: Diagnosis not present

## 2024-06-26 NOTE — Progress Notes (Unsigned)
 [facility] Counselor/Therapist Progress Note  Patient ID: Heather Thompson, MRN: [id number],    Date: 06/26/2024  Time Spent:  53 minutes 1257-150pm  Treatment Type: Individual Therapy  Risk Assessment: Danger to Self:  No Self-injurious Behavior: No Danger to Others: No  Subjective: The patient arrived on time for her in person appointment.  Issues addressed: 1-physical a-saw a different provider at Dr. Morrison office that she much preferred and was very caring -pt will be getting her surgery as scheduled but remains anxious about her recovery plan 2-mood -pt feels disappointed  3-post-op recovery -daughter who has told her she will not be down to help her during recovery -daughter told her to get church friends to assist however pt got only minimal response -pt doesn't feel she can count on her sister due to her own circumstances with work and caring for her grandson -friends that were going to help have their own physical limitations with one having a recent back issues -discussed with pt the danger she will put herself in without anyone there to assist -suggested pt consider nursing home despite the MD office's recommendations that she recover at home -suggested pt consider requesting social work consult at hospital once admitted and to honestly express her lack of support and safety concerns    Treatment Plan Problems: Financial Stress, Low Self-Esteem, Attention Deficit Disorder (ADD) - Adult Symptoms: Unable to concentrate or pay attention to things of low interest, even when those things are important to his/her life. Restless and fidgety; unable to be sedentary for more than a short time. Disorganized in most areas of his/her life. Starts many projects but rarely finishes any. Chronic low self-esteem. Tendency toward addictive behaviors. Indebtedness and overdue bills that exceed ability to meet monthly payments. A long-term lack of discipline in money  management that has led to excessive indebtedness. A pattern of impulsive spending that does not consider the eventual financial consequences. Inability to accept compliments. Makes self-disparaging remarks; sees self as unattractive, worthless, a loser, a burden, unimportant; takes blame easily. Difficulty in saying no to others; assumes not being liked by others. Fear of rejection by others, especially peer group. Lack of any goals for life and setting of inappropriately low goals for self. Inability to identify positive characteristics of self. Anxious and uncomfortable in social situations. Goals: Reduce impulsive actions while increasing concentration and focus on low-interest activities. Minimize ADD behavioral interference in daily life. Sustain attention and concentration for consistently longer periods of time. Achieve an inner strength to control personal impulses, cravings, and desires that directly or indirectly increase debt irresponsibly. Resolve financial crisis with a path to eliminate debt. Revise spending patterns to not exceed income. Gain a new sense of self-worth in which the substance of one's value is not attached to the capacity to do things or own things that cost money. Understand personal desires, insecurities, and anxieties that make overspending possible. Elevate self-esteem. Develop a consistent, positive self-image. Demonstrate improved self-esteem through more pride in appearance, more assertiveness, greater eye contact, and identification of positive traits in self-talk messages. Establish an inward sense of self-worth, confidence, and competence. Interact socially without undue distress or disability. Objectives target date for all objectives is 03/25/2025: Identify the current specific ADD behaviors that cause the most difficulty. List the negative consequences of the ADD problematic behavior. Learn and implement organization and planning  skills. Acknowledge procrastination and the need to reduce it. Learn and implement skills to reduce procrastination. Learn and implement skills to reduce the  disruptive influence of distractibility. Combine skills learned in therapy into a new daily approach to managing ADHD. Use cognitive and behavioral strategies to control the impulse to make unnecessary and unaffordable purchases. Report instances of successful control over impulse to spend on unnecessary expenses. Increase insight into the historical and current sources of low self-esteem. Decrease the frequency of negative self-descriptive statements and increase frequency of positive self-descriptive statements. Articulate a plan to be proactive in trying to get identified needs met. Form realistic, appropriate, and attainable goals for self in all areas of life. Take verbal responsibility for accomplishments without discounting. Identify and replace negative self-talk messages used to reinforce low self-esteem. Objectives target date for all objectives is 03/13/2025: Identify the specific anxiety symptoms that are personally most disturbing or most contributing to impaired functioning. Learn and practice thought and behavioral control methods to minimize and control anxiety symptoms once they have begun. Identify specific thoughts that precipitate anxiety symptoms. Replace anxiety-producing thoughts with constructive thoughts. Verbalize an understanding of the general physical and cognitive manifestations of the causes for anxiety. Identify and clarify the patterns of anxiety precipitants and consequences. Identify whether the symptoms of depression seem to be primarily related to interpersonal relationships, stressful life events or circumstances, thoughts/beliefs, or behaviors. Verbalize an understanding of the general physical and psychological effects of depression. Replace depression-promoting thoughts with mood-elevating  thoughts. Keep a daily record of mood rating from 1 to 10, noting associated behaviors, activities, events, people, and thoughts. Identify specific thoughts that precipitate depressive moods. Monitor and report depressive symptoms, completing self-report assessment on a periodic basis. Describe and evaluate significant past and current interpersonal relationships. Tell memories of the deceased person, starting with positive memories. Consider possible ways of increasing involvement with others. Accept the reality of loss and tolerate the pain of intense grief. Engage in events and activities that increase level of social involvements. Identify and monitor specific pain triggers. Learn and implement somatic skills such as relaxation and/or biofeedback to reduce pain level. Learn mental coping skills and implement with somatic skills for managing acute pain. Interventions: Reinforce the client's belief system and/or refer to a spiritual leader who can support the client's faith. Explore and problem-solve with the client difficulties, fears, or barriers related to increasing social involvement. Point out to the client that engagement with others is associated with decreased feelings of loneliness, grief, depression, and hopelessness. Provide encouragement and support to the client for increased engagement in contact with others. Clarify with the client that expression of negative thoughts or feelings about the deceased is not disloyal. Assist the client in remembering the loved one in realistic ways (i.e., includes attractions, positive traits, negative traits, conflicts, ambivalence, dependency, etc.), a memory that is neither idealized nor demonized, so that the client's relationship with the deceased person can be appraised realistically (or assign Dear _____: A Letter to a Lost Loved One in the Adult Psychotherapy Homework Planner, 2nd ed. by Jenniffer). Assign the client to read about  progressive muscle relaxation and other calming strategies in relevant books or treatment manuals (e.g., Progressive Relaxation Training by Thornell armin Collier). Assign a homework exercise in which the client implements somatic pain management skills and records the result; review and process during the treatment session. Teach the client distraction techniques (e.g., pleasant imagery, counting techniques, alternative focal points) and how to use them for relaxation skills for the management of acute episodes of pain. Ask the client to create a list of activities that are pleasurable to him/her (or assign  Identify and Schedule Pleasant Activities in the Adult Psychotherapy Homework Planner, 2nd ed. by Jenniffer); process the list, developing a plan of increasing the frequency of engaging in the selected pleasurable activities. Teach the client problem-solving skills to apply to removal of obstacles to implementing new skills; role-play implementation of these skills to obstacles. Assist the client in identifying the most effective personal stress management techniques (e.g., prayer, walking, baking, telephoning a friend), and encourage daily scheduling of these activities (or assign Past Successful Anxiety Coping in the Adult Psychotherapy Homework Planner, 2nd ed. by Jenniffer). Teach the client the concept of finding a good match between the individual's capacities and the demands of the physical environment. If the physical environment is too demanding for the individual's capacities (e.g., frail older adult taking care of a large house), the individual can become overwhelmed and may need to move from large home to smaller accommodations or residence for older adults). While reviewing the client's anxiety symptom chart, help him/her recognize patterns associated with anxiety symptoms: sort out precipitants from consequences, identify the most intense or frequent precipitants, and identify the  consequences that help to perpetuate maladaptive patterns. Assist the client in identifying his/her current attempts to reducing anxiety (e.g., constantly telephoning family or physician, making unneeded doctor appointments or going to the emergency room), their apparent positive consequences (e.g., feeling better, getting attention), but longer-term negative consequences (e.g., physician won't return calls, friends avoid the client because of expressions of worry). Teach the client that his/her mood can be improved by increasing pleasant events and decreasing unpleasant events. Encourage the client to identify pleasant events that are desirable, but are not currently a part of his/her daily routine (see Identify and Schedule Pleasant Activities in the Adult Psychotherapy Homework Planner, 2nd ed. by Jenniffer). Develop a one-week daily schedule with the client that increases pleasant events and decreases unpleasant events, making sure to have at least one pleasant event every day. Help the client to understand his/her strengths and weaknesses in problem-solving, to view problems as challenges and not personal deficits, and to identify and define current problem(s) that will be the focus of treatment. Help the client to identify realistic goals to address problem(s) of concern and identify major obstacles in achieving goals. Encourage the client to implement chosen solutions to current life problem(s), self-monitor and evaluate solution implementation, and reward self for efforts. Explore the relationship of negative self-talk (e.g., I'm just a burden to everyone; Everyone would be better off if I were dead) and distorted beliefs (e.g., There's no one like me living here; I'd rather never leave my room than have anyone see me in a wheelchair) to depressed mood (or assign Negative Thoughts Trigger Negative Feelings in the Adult Psychotherapy Homework Planner, 2nd ed. by Jenniffer; or Daily Record of  Dysfunctional Thoughts in Cognitive Therapy of Depression by Almarie Candida Gentry and Shona). Encourage the client to distinguish between all-or-nothing thinking (e.g., Life in a nursing home can't be worth living) and genuine nuanced reflection on life's meaning and purpose (e.g., What's giving my life meaning at this stage?); confront the former, encourage the latter.  Diagnosis:Major depressive disorder, recurrent episode, moderate (HCC)  Generalized anxiety disorder  Grief  Plan:  -meet again on Thursday, July 03, 2024 at 11am in person

## 2024-06-26 NOTE — Patient Instructions (Signed)
 SURGICAL WAITING ROOM VISITATION  Patients having surgery or a procedure may have no more than 2 support people in the waiting area - these visitors may rotate.    Children under the age of 71 must have an adult with them who is not the patient.  Visitors with respiratory illnesses are discouraged from visiting and should remain at home.  If the patient needs to stay at the hospital during part of their recovery, the visitor guidelines for inpatient rooms apply. Pre-op nurse will coordinate an appropriate time for 1 support person to accompany patient in pre-op.  This support person may not rotate.    Please refer to the [facility] website for the visitor guidelines for Inpatients (after your surgery is over and you are in a regular room).       Your procedure is scheduled on:  07/07/2024    Report to [facility] Main Entrance    Report to admitting at   [phone]   Call this number if you have problems the morning of surgery [phone]   Do not eat food :After Midnight.   After Midnight you may have the following liquids until _ 0915_____ AM  DAY OF SURGERY  Water Non-Citrus Juices (without pulp, NO RED-Apple, White grape, White cranberry) Black Coffee (NO MILK/CREAM OR CREAMERS, sugar ok)  Clear Tea (NO MILK/CREAM OR CREAMERS, sugar ok) regular and decaf                             Plain Jell-O (NO RED)                                           Fruit ices (not with fruit pulp, NO RED)                                     Popsicles (NO RED)                                                               Sports drinks like Gatorade (NO RED)              Drink 2 Ensure/G2 drinks AT 10:00 PM the night before surgery.       The day of surgery:  Drink ONE (1) Pre-Surgery Clear Ensure or G2 at  0915AM the morning of surgery. Drink in one sitting. Do not sip.  This drink was given to you during your hospital  pre-op appointment visit. Nothing else to drink after completing  the  Pre-Surgery Clear Ensure or G2.          If you have questions, please contact your surgeon's office.   FOLLOW BOWEL PREP AND ANY ADDITIONAL PRE OP INSTRUCTIONS YOU RECEIVED FROM YOUR SURGEON'S OFFICE!!!     Oral Hygiene is also important to reduce your risk of infection.                                    Remember -  BRUSH YOUR TEETH THE MORNING OF SURGERY WITH YOUR REGULAR TOOTHPASTE  DENTURES WILL BE REMOVED PRIOR TO SURGERY PLEASE DO NOT APPLY Poly grip OR ADHESIVES!!!   Do NOT smoke after Midnight   Stop all vitamins and herbal supplements 7 days before surgery.   Take these medicines the morning of surgery with A SIP OF WATER:   DO NOT TAKE ANY ORAL DIABETIC MEDICATIONS DAY OF YOUR SURGERY  Bring CPAP mask and tubing day of surgery.                              You may not have any metal on your body including hair pins, jewelry, and body piercing             Do not wear make-up, lotions, powders, perfumes/cologne, or deodorant  Do not wear nail polish including gel and S&S, artificial/acrylic nails, or any other type of covering on natural nails including finger and toenails. If you have artificial nails, gel coating, etc. that needs to be removed by a nail salon please have this removed prior to surgery or surgery may need to be canceled/ delayed if the surgeon/ anesthesia feels like they are unable to be safely monitored.   Do not shave  48 hours prior to surgery.               Men may shave face and neck.   Do not bring valuables to the hospital. [facility] IS NOT             RESPONSIBLE   FOR VALUABLES.   Contacts, glasses, dentures or bridgework may not be worn into surgery.   Bring small overnight bag day of surgery.   DO NOT BRING YOUR HOME MEDICATIONS TO THE HOSPITAL. PHARMACY WILL DISPENSE MEDICATIONS LISTED ON YOUR MEDICATION LIST TO YOU DURING YOUR ADMISSION IN THE HOSPITAL!    Patients discharged on the day of surgery will not be allowed to drive  home.  Someone NEEDS to stay with you for the first 24 hours after anesthesia.   Special Instructions: Bring a copy of your healthcare power of attorney and living will documents the day of surgery if you haven't scanned them before.              Please read over the following fact sheets you were given: IF YOU HAVE QUESTIONS ABOUT YOUR PRE-OP INSTRUCTIONS PLEASE CALL [phone].   If you received a COVID test during your pre-op visit  it is requested that you wear a mask when out in public, stay away from anyone that may not be feeling well and notify your surgeon if you develop symptoms. If you test positive for Covid or have been in contact with anyone that has tested positive in the last 10 days please notify you surgeon.      Pre-operative 5 CHG Bath Instructions   You can play a key role in reducing the risk of infection after surgery. Your skin needs to be as free of germs as possible. You can reduce the number of germs on your skin by washing with CHG (chlorhexidine gluconate) soap before surgery. CHG is an antiseptic soap that kills germs and continues to kill germs even after washing.   DO NOT use if you have an allergy to chlorhexidine/CHG or antibacterial soaps. If your skin becomes reddened or irritated, stop using the CHG and notify one of our RNs at [phone].   Please  shower with the CHG soap starting 4 days before surgery using the following schedule:     Please keep in mind the following:  DO NOT shave, including legs and underarms, starting the day of your first shower.   You may shave your face at any point before/day of surgery.  Place clean sheets on your bed the day you start using CHG soap. Use a clean washcloth (not used since being washed) for each shower. DO NOT sleep with pets once you start using the CHG.   CHG Shower Instructions:  If you choose to wash your hair and private area, wash first with your normal shampoo/soap.  After you use shampoo/soap, rinse  your hair and body thoroughly to remove shampoo/soap residue.  Turn the water OFF and apply about 3 tablespoons (45 ml) of CHG soap to a CLEAN washcloth.  Apply CHG soap ONLY FROM YOUR NECK DOWN TO YOUR TOES (washing for 3-5 minutes)  DO NOT use CHG soap on face, private areas, open wounds, or sores.  Pay special attention to the area where your surgery is being performed.  If you are having back surgery, having someone wash your back for you may be helpful. Wait 2 minutes after CHG soap is applied, then you may rinse off the CHG soap.  Pat dry with a clean towel  Put on clean clothes/pajamas   If you choose to wear lotion, please use ONLY the CHG-compatible lotions on the back of this paper.     Additional instructions for the day of surgery: DO NOT APPLY any lotions, deodorants, cologne, or perfumes.   Put on clean/comfortable clothes.  Brush your teeth.  Ask your nurse before applying any prescription medications to the skin.      CHG Compatible Lotions   Aveeno Moisturizing lotion  Cetaphil Moisturizing Cream  Cetaphil Moisturizing Lotion  Clairol Herbal Essence Moisturizing Lotion, Dry Skin  Clairol Herbal Essence Moisturizing Lotion, Extra Dry Skin  Clairol Herbal Essence Moisturizing Lotion, Normal Skin  Curel Age Defying Therapeutic Moisturizing Lotion with Alpha Hydroxy  Curel Extreme Care Body Lotion  Curel Soothing Hands Moisturizing Hand Lotion  Curel Therapeutic Moisturizing Cream, Fragrance-Free  Curel Therapeutic Moisturizing Lotion, Fragrance-Free  Curel Therapeutic Moisturizing Lotion, Original Formula  Eucerin Daily Replenishing Lotion  Eucerin Dry Skin Therapy Plus Alpha Hydroxy Crme  Eucerin Dry Skin Therapy Plus Alpha Hydroxy Lotion  Eucerin Original Crme  Eucerin Original Lotion  Eucerin Plus Crme Eucerin Plus Lotion  Eucerin TriLipid Replenishing Lotion  Keri Anti-Bacterial Hand Lotion  Keri Deep Conditioning Original Lotion Dry Skin Formula Softly  Scented  Keri Deep Conditioning Original Lotion, Fragrance Free Sensitive Skin Formula  Keri Lotion Fast Absorbing Fragrance Free Sensitive Skin Formula  Keri Lotion Fast Absorbing Softly Scented Dry Skin Formula  Keri Original Lotion  Keri Skin Renewal Lotion Keri Silky Smooth Lotion  Keri Silky Smooth Sensitive Skin Lotion  Nivea Body Creamy Conditioning Oil  Nivea Body Extra Enriched Teacher, adult education Moisturizing Lotion Nivea Crme  Nivea Skin Firming Lotion  NutraDerm 30 Skin Lotion  NutraDerm Skin Lotion  NutraDerm Therapeutic Skin Cream  NutraDerm Therapeutic Skin Lotion  ProShield Protective Hand Cream  Provon moisturizing lotion

## 2024-06-27 ENCOUNTER — Telehealth: Payer: Self-pay | Admitting: Pharmacist

## 2024-06-27 NOTE — Telephone Encounter (Signed)
 Appeal has been submitted for Temazepam . Will advise when response is received, please be advised that most companies may take 30 days to make a decision. Appeal letter and supporting documentation have been faxed to [phone] on 06/27/2024 @10 :52 am.  Thank you, Devere Pandy, PharmD Clinical Pharmacist  [place]  Direct Dial: [phone]

## 2024-06-29 NOTE — H&P (Addendum)
 [facility]  History and Physical   Assessment   Patient is a 73 year old female, with a PMH of HTN, Chronic Pain, Anxiety, and Depression, who presents to the hospital for Left Cellulitis and Sepsis.  Plan  # Left Foot Cellulitis- Left foot is erythematous and warm without visible trauma or open wounds. Will start IV vancomycin and ceftriaxone 2 gm q24 hours. Blood cultures cultures. Will obtain DVT US  to eval fro thrombus. Will draw line around erythema to monitor for progression.  Will trend daily Cr to monitor for Vancomycin Toxicity. Will obtain a Hg A1C.   # Sepsis Without Acute Organ Dysfunction - Defined by HR above 90, Tmax of 102, and WBC count elevation of 17.1. Suspect secondary to above. Lactic acid is normal at 1.3. Will start LR at 100 cc/hr. Will trend daily WBC.   # Acute Mid Line Lower Back Pain With Bilateral Sciatica  With a History of Chronic Pain- Patient had mid lower spine tenderness, but remains neurologically intact in LE bilaterally. Possibly MSK in nature. Will treat pain with Acetaminophen  1000 mg every 8 hours, Voltaren gel  2 gm QID, and Lidocaine  patch to attempt to limit opioid requirement with continuance of gabapentin  300 mg TID. Will start IV Dilaudid 0.5 mg PRN q2 hours for breakthrough pain. PRN narcan and miralax ordered  Will obtain CT L Spine and CT AP to evaluate for intraabdominal, and spinal issues that could explain acute pain   # Moderate Hyponatremia Na is low at 129. Likely secondary to sepsis. Will start IVF per above and trend daily Na.  # Essential HTN- Will hold Lisinopril  10 mg daily int he setting of sepsis.   # Anxiety and Depression- Will continue Bupropion  300 mg daily, Prozac  10 mg daily, and Desvenlafxine 100 mg daily while admitted  DVT- Lovenox Diet- Cardiac Diet IVF- LR at 100 cc/hr  Expected length of stay of 1 midnight admitted to the hospital under observation status for medical complexity  including Left Foot Cellulitis, Sepsis, and Acute Back Pain Requiring IV ABX, IVF, and IV Dilaudid PRN.      Fluid and electrolyte disorder: N/A, present on admission  Plan: N/A  Abnormal blood counts: N/A, present on admission Plan: N/A  Nutritional status: Body mass index is 33.99 kg/m. N/A Plan: N/A Cardiac diet  Coagulation defect: N/A, present on admission Plan: N/A  Elevated LFTs/transaminitis due to: N/A, present on admission Plan: N/A  Debility: N/A, present on admission Plan: N/A      History  CC- Lower Back Pain  HPI- Patient is a 73 year old female, with a PMH of HTN, Chronic Pain, Anxiety, and Depression, who presents to the hospital for severe back pain. Patient was lifting multiple heavy groceries on 06-27-2024. After event, patient had progressive back and neck pain with back worse then neck. Back pain continued to progress prompting patient to seek medical care.  No focal weakness or numbness in LE extremity.   Of note, while in the ER on 06-29-2024, patient was incidentally found to have left foot redness and swelling.  Personally Reviewed Dr. Link ER Note on 06-28-2024 for Neck pain    Past Medical History:  Diagnosis Date  . Arthritis   . DVT (deep venous thrombosis) (*)    age 65 years old  . Fibromyalgia   . Hypertension   . Muscle pain    CHRONIC  . Muscular dystrophy (*)    MYOADENYLATE DEAMINASE DEFICIENCY  .  Osteoarthritis   . 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 Appendectomy    . Arthroplasty Bilateral 02/2022   THUMB  . Carpal tunnel release Bilateral 02/2022  . Colonoscopy     unknown  . Dilation and curettage, diagnostic / therapeutic    . Eye surgery Bilateral    cornea scrapings  . Foot surgery Bilateral    fusion  . Fracture surgery Left    ankle  . Gastric bypass open    . Hysterectomy    . Laminectomy    . Laparoscopic cholecystectomy    . Neck fusion    .  Total hip arthroplasty Bilateral   . Upper gastrointestinal endoscopy     unknown    Allergies[1] Prior to Admission medications  Medication Sig Start Date End Date Taking? Authorizing Provider  bisacodyl (BISACODYL,DULCOLAX) 5 mg EC tablet Take one tablet (5 mg dose) by mouth daily as needed.    Historical Provider, MD  buPROPion  hcl (WELLBUTRIN  XL) 300 mg 24 hr tablet Take one tablet (300 mg dose) by mouth every morning. 11/08/21   Historical Provider, MD  cephALEXin (KEFLEX) 500 mg capsule Take one capsule (500 mg dose) by mouth 4 (four) times daily for 10 days. 06/29/24 07/09/24  Lynwood JAYSON Life, MD  desvenlafaxine  ER 100 mg TB24 tablet Take one tablet (100 mg dose) by mouth daily.    Historical Provider, MD  doxycycline hyclate (VIBRAMYCIN) 100 mg capsule Take one capsule (100 mg dose) by mouth 2 (two) times daily for 10 days. 06/29/24 07/09/24  Lynwood JAYSON Life, MD  FLUoxetine  (PROZAC ) 10 mg capsule Take one capsule (10 mg dose) by mouth every morning. 08/10/22 06/28/24  Historical Provider, MD  gabapentin  (NEURONTIN ) 300 mg capsule Take one capsule (300 mg dose) by mouth 3 (three) times a day. 06/13/24   Janelle M Negron Rivera, NP  HYDROmorphone (DILAUDID) 2 mg tablet Take one tablet (2 mg dose) by mouth 2 (two) times a day as needed.    Historical Provider, MD  lisinopril  (PRINIVIL ,ZESTRIL ) 10 mg tablet Take one tablet (10 mg dose) by mouth daily.    Historical Provider, MD  Magnesium 250 MG TABS Take one tablet by mouth daily.    Historical Provider, MD  Multiple Vitamin (MULTI-VITAMIN) tablet Take one tablet by mouth daily.    Historical Provider, MD  naloxone [facility]) nasal spray (TAKE HOME PACK) as directed Nasally OPIOID EMERGENCY ONLY for 1 days Patient not taking: Reported on 06/28/2024    Historical Provider, MD  nystatin  (MYCOSTATIN ) powder SMARTSIG:Topical Twice Daily 06/28/22   Historical Provider, MD  propranolol  HCl (INDERAL ) 20 mg tablet Take one tablet (20 mg dose) by mouth as needed.  02/03/22   Historical Provider, MD  solifenacin  succinate (VESICARE ) 10 mg tablet Take one tablet (10 mg dose) by mouth daily.    Historical Provider, MD  temazepam  (RESTORIL ) 22.5 MG capsule Take one capsule (22.5 mg dose) by mouth at bedtime. 07/20/23   Historical Provider, MD  tizanidine  (ZANAFLEX ) 2 MG tablet Take one tablet (2 mg dose) by mouth 2 (two) times daily. 05/11/23   Historical Provider, MD  traMADol  (ULTRAM ) 50 mg tablet Take one tablet (50 mg dose) by mouth as needed. 03/06/22   Historical Provider, MD   Social History[2] Family History  Problem Relation Age of Onset  . Heart attack Mother   . Cancer Brother   . Anesthesia problems Neg Hx    Review  of Systems  Constitutional:  Negative for chills and fatigue.  HENT:  Negative for ear pain, rhinorrhea and sore throat.   Eyes:  Negative for pain, discharge, itching and visual disturbance.  Respiratory:  Negative for cough, shortness of breath and wheezing.   Cardiovascular:  Negative for chest pain, palpitations and leg swelling.  Gastrointestinal:  Negative for diarrhea, nausea and vomiting.  Genitourinary:  Negative for dysuria, flank pain and hematuria.  Musculoskeletal:  Positive for arthralgias, back pain and neck pain.  Skin:  Positive for rash. Negative for color change and pallor.  Neurological:  Positive for weakness.       Generalize Weakness, No focal weakness         Physical Examination  Temp:  [101 F (38.3 C)] 101 F (38.3 C) Heart Rate:  [95-124] 95 Resp:  [18-19] 18 BP: (149-168)/(69-108) 149/69 SpO2:  [95 %-99 %] 96 % Pain Score: 10-Worst pain ever Adult Faces: 6 O2 Device: None (Room air)    Physical Exam Vitals reviewed.  Constitutional:      General: She is not in acute distress.    Appearance: Normal appearance. She is obese. She is not ill-appearing.  HENT:     Head: Normocephalic and atraumatic.  Eyes:     General:        Right eye: No discharge.        Left eye: No discharge.      Extraocular Movements: Extraocular movements intact.     Conjunctiva/sclera: Conjunctivae normal.  Cardiovascular:     Rate and Rhythm: Normal rate and regular rhythm.     Heart sounds: Normal heart sounds. No murmur heard.    No friction rub.  Musculoskeletal:        General: Swelling and tenderness present. No deformity or signs of injury.     Cervical back: Neck supple. No tenderness.     Right lower leg: No edema.     Left lower leg: No edema.     Comments: Left foot swelling, midline spinal tenderness   Pulmonary:     Effort: Pulmonary effort is normal. No respiratory distress.     Breath sounds: Normal breath sounds. No stridor. No wheezing.  Abdominal:     General: Abdomen is flat. Bowel sounds are normal. There is no distension.     Palpations: Abdomen is soft. There is no mass.     Tenderness: There is no abdominal tenderness.  Lymphadenopathy:     Cervical: No cervical adenopathy.  Skin:    General: Skin is warm.     Findings: Erythema present.     Comments: Left foot erythema. No open wounds   Neurological:     General: No focal deficit present.     Mental Status: She is alert. Mental status is at baseline.     Cranial Nerves: No cranial nerve deficit.     Sensory: No sensory deficit.     Motor: No weakness.     Comments: 5/5 strength and light touch sensation intact in LE bilaterally   Psychiatric:        Mood and Affect: Mood normal.        Behavior: Behavior normal.        Thought Content: Thought content normal.        Judgment: Judgment normal.   Results  Labs:  Recent Results (from the past 24 hours)  Urinalysis Only w/ Reflex Microscopic - No Symptoms   Collection Time: 06/29/24  8:55 PM  Result Value  Ref Range   Urine Color Yellow Yellow    Urine Clarity Clear    Urine Specific Gravity 1.025 1.005 - 1.030   Urine pH 6.0 5 to 9   Urine Protein - Dipstick 10 (A) Negative mg/dl   Urine Glucose Negative Negative mg/dL   Urine Ketones 10 (A) Negative  mg/dl   Urine Bilirubin Negative Negative mg/dL   Urine Blood 0.2 (A) Negative mg/dL   Urine Nitrite Negative Negative   Urine Urobilinogen 2 (A) <2 mg/dl   Urine Leukocyte Esterase 250 (A) Negative Leu/mcL   Urine Squamous Epithelial Cells 0-2 0 - 2 /HPF   Urine WBC 0-2 0 - 2 /hpf   Urine RBC 0-2 0 - 2 /HPF   Urine Bacteria None None /HPF   UA Microscopic Yes Micro (A) No Micro  CBC And Differential   Collection Time: 06/29/24 10:15 PM  Result Value Ref Range   WBC 17.1 (H) 4.0 - 10.5 thou/mcL   RBC 3.89 (L) 3.93 - 5.22 million/mcL   HGB 11.9 11.2 - 15.7 gm/dL   HCT 64.4 65.8 - 55.0 %   MCV 91.3 79.4 - 94.8 fL   MCH 30.6 25.6 - 32.2 pg   MCHC 33.5 32.2 - 35.5 gm/dL   Plt Ct 803 849 - 599 thou/mcL   RDW SD 44.0 35.1 - 46.3 fL   MPV 10.2 9.4 - 12.4 fL   NRBC% 0.0 0.0 - 0.2 /100WBC   Absolute NRBC Count 0.00 0.00 - 0.01 thou/mcL   NEUTROPHIL % 90.9 %   LYMPHOCYTE % 3.2 %   MONOCYTE % 4.4 %   Eosinophil % 0.5 %   BASOPHIL % 0.2 %   IG% 0.8 %   ABSOLUTE NEUTROPHIL COUNT 15.52 (H) 1.50 - 7.50 thou/mcL   ABSOLUTE LYMPHOCYTE COUNT 0.55 (L) 1.00 - 4.50 thou/mcL   Absolute Monocyte Count 0.76 0.10 - 0.80 thou/mcL   Absolute Eosinophil Count 0.09 0.00 - 0.50 thou/mcL   Absolute Basophil Count 0.03 0.00 - 0.20 thou/mcL   Absolute Immature Granulocyte Count 0.14 (H) 0.00 - 0.03 thou/mcL   Comprehensive Metabolic Panel   Collection Time: 06/29/24 10:15 PM  Result Value Ref Range   Na 129 (L) 136 - 146 mmol/L   Potassium 3.8 3.7 - 5.4 mmol/L   Cl 90 (L) 97 - 108 mmol/L   CO2 23 20 - 32 mmol/L   AGAP 16 7 - 16 mmol/L   Glucose 155 (H) 65 - 99 mg/dL   BUN 15 8 - 27 mg/dL   Creatinine 9.32 9.42 - 1.00 mg/dL   Ca 9.1 8.6 - 89.7 mg/dL   ALK PHOS 867 25 - 834 U/L   T Bili 0.6 0.0 - 1.2 mg/dL   Total Protein 7.3 6.0 - 8.5 gm/dL   Alb 4.1 3.5 - 4.8 gm/dL   GLOBULIN 3.2 1.5 - 4.5 gm/dL   ALBUMIN/GLOBULIN RATIO 1.3 1.1 - 2.5   BUN/CREAT RATIO 22.4 11.0 - 26.0   ALT 92 (H) 0 - 40 U/L    AST 73 (H) 0 - 40 U/L   eGFR 92 >=60 mL/min/1.60m2  Lactic Acid - Yes reflex 2 hour   Collection Time: 06/29/24 10:15 PM  Result Value Ref Range   Lactic Acid 1.3 0.5 - 2.0 mmol/L   Imaging: No results found. ECG: No results found.  Coding  Electronically signed: Yancy Sorrel, MD 06/29/2024 / 11:32 PM       [1] Allergies Allergen Reactions  . Compazine Anaphylaxis  .  Phenothiazines Anaphylaxis, Other and Swelling    Caused her throat to close Other reaction(s): Unknown (comments) Other reaction(s): Unknown (comments) Caused her throat to close   . Prednisone Other, Rash, Swelling and Itching    Other reaction(s): Unknown (comments)  prednisone  Facial swelling  . Pregabalin Itching, Other, Swelling and Unknown    Swelling and itching of hands and feet.  Swelling and itching of hands and feet.  Other reaction(s): Unknown (comments) Swelling and itching of hands and feet.  Other reaction(s): Unknown (comments) Swelling and itching of hands and feet.  Swelling and itching of hands and feet.  Swelling and itching of hands and feet.    [2] Social History Socioeconomic History  . Marital status: Widowed  Tobacco Use  . Smoking status: Never  . Smokeless tobacco: Never  Vaping Use  . Vaping status: Never Used  Substance and Sexual Activity  . Alcohol use: Never  . Drug use: Never

## 2024-06-30 DIAGNOSIS — L03116 Cellulitis of left lower limb: Secondary | ICD-10-CM | POA: Insufficient documentation

## 2024-06-30 NOTE — Telephone Encounter (Signed)
 Insurance has approved the appeal for Temazepam :    Thank you, Devere Pandy, PharmD Clinical Pharmacist  [place]  Direct Dial: [phone]

## 2024-07-01 ENCOUNTER — Encounter (HOSPITAL_COMMUNITY)
Admission: RE | Admit: 2024-07-01 | Discharge: 2024-07-01 | Disposition: A | Discharge status: Home / Self Care | Source: Ambulatory Visit | Attending: Medical-Surgical | Admitting: Medical-Surgical

## 2024-07-03 ENCOUNTER — Ambulatory Visit: Admitting: Professional

## 2024-07-07 ENCOUNTER — Encounter (HOSPITAL_COMMUNITY): Admission: RE | Payer: Self-pay | Source: Home / Self Care

## 2024-07-07 ENCOUNTER — Inpatient Hospital Stay: Payer: Medicare Other

## 2024-07-07 ENCOUNTER — Ambulatory Visit (HOSPITAL_COMMUNITY): Admission: RE | Admit: 2024-07-07 | Source: Home / Self Care | Admitting: Orthopedic Surgery

## 2024-07-07 ENCOUNTER — Inpatient Hospital Stay: Payer: Medicare Other | Admitting: Family

## 2024-07-07 ENCOUNTER — Telehealth: Payer: Self-pay

## 2024-07-07 SURGERY — ARTHROPLASTY, KNEE, TOTAL
Anesthesia: Spinal | Site: Knee | Laterality: Right

## 2024-07-07 NOTE — Transitions of Care (Post Inpatient/ED Visit) (Signed)
   07/07/2024  Name: Heather Thompson MRN: [id number] DOB: [birth year]  Today's TOC FU Call Status: Today's TOC FU Call Status:: Successful TOC FU Call Completed TOC FU Call Complete Date: 07/07/24 Patient's Name and Date of Birth confirmed.  Transition Care Management Follow-up Telephone Call Date of Discharge: 07/04/24 Discharge Facility: Other ([facility]) Name of Other ([facility]) Discharge Facility: [facility] Type of Discharge: Inpatient Admission Primary Inpatient Discharge Diagnosis:: cellulitis How have you been since you were released from the hospital?: Better Any questions or concerns?: No  Items Reviewed: Did you receive and understand the discharge instructions provided?: Yes Medications obtained,verified, and reconciled?: Yes (Medications Reviewed) Any new allergies since your discharge?: No Dietary orders reviewed?: Yes Do you have support at home?: No  Medications Reviewed Today: Medications Reviewed Today   Medications were not reviewed in this encounter     Home Care and Equipment/Supplies: Were Home Health Services Ordered?: NA Any new equipment or medical supplies ordered?: NA  Functional Questionnaire: Do you need assistance with bathing/showering or dressing?: No Do you need assistance with meal preparation?: No Do you need assistance with eating?: No Do you have difficulty maintaining continence: No Do you need assistance with getting out of bed/getting out of a chair/moving?: No Do you have difficulty managing or taking your medications?: No  Follow up appointments reviewed: PCP Follow-up appointment confirmed?: Yes Date of PCP follow-up appointment?: 07/08/24 Follow-up Provider: [facility] Follow-up appointment confirmed?: NA Do you need transportation to your follow-up appointment?: No Do you understand care options if your condition(s) worsen?: Yes-patient verbalized understanding    SIGNATURE Julian Lemmings, LPN [facility] Nurse  Health Advisor Direct Dial [phone]

## 2024-07-08 ENCOUNTER — Ambulatory Visit (INDEPENDENT_AMBULATORY_CARE_PROVIDER_SITE_OTHER): Admitting: Medical-Surgical

## 2024-07-08 ENCOUNTER — Encounter: Payer: Self-pay | Admitting: Medical-Surgical

## 2024-07-08 VITALS — BP 170/81 | HR 84 | Temp 98.9°F | Resp 20 | Ht 64.0 in | Wt 200.0 lb

## 2024-07-08 DIAGNOSIS — I10 Essential (primary) hypertension: Secondary | ICD-10-CM

## 2024-07-08 DIAGNOSIS — L03116 Cellulitis of left lower limb: Secondary | ICD-10-CM

## 2024-07-08 DIAGNOSIS — B370 Candidal stomatitis: Secondary | ICD-10-CM | POA: Insufficient documentation

## 2024-07-08 DIAGNOSIS — R590 Localized enlarged lymph nodes: Secondary | ICD-10-CM

## 2024-07-08 DIAGNOSIS — Z09 Encounter for follow-up examination after completed treatment for conditions other than malignant neoplasm: Secondary | ICD-10-CM

## 2024-07-08 MED ORDER — LISINOPRIL 20 MG PO TABS: 20.0000 mg | ORAL_TABLET | Freq: Every day | ORAL | 3 refills | Status: AC

## 2024-07-08 MED ORDER — DOXYCYCLINE HYCLATE 100 MG PO TABS: 100.0000 mg | ORAL_TABLET | Freq: Two times a day (BID) | ORAL | 0 refills | Status: AC

## 2024-07-08 MED ORDER — NYSTATIN 100000 UNIT/ML MT SUSP: 500000.0000 [IU] | Freq: Four times a day (QID) | OROMUCOSAL | 0 refills | Status: DC

## 2024-07-08 NOTE — Progress Notes (Signed)
 Established patient visit   History of Present Illness   Discussed the use of AI scribe software for clinical note transcription with the patient, who gave verbal consent to proceed.  History of Present Illness   Heather Thompson is a 73 year old female who presents with cellulitis and antibiotic allergy.  Heather Thompson initially developed cellulitis during a hospital visit for neck and back pain. Her foot was red and swollen, and she was treated with IV antibiotics, including vancomycin, until Thursday. She was discharged on Friday with oral cefadroxil, which she discontinued due to an allergic reaction characterized by itching. She describes symptoms suggestive of oral thrush, with a coated feeling in her mouth, although no white patches are observed. A clear gel provided temporary numbness but did not resolve the symptoms. She has allergies to Compazine, phenothiazines, prednisone, pregabalin, and now cefadroxil. She has previously tolerated amoxicillin and doxycycline . She also has a history of yeast infections following antibiotic use.  Her blood pressure is elevated, with readings of 170/81, despite taking lisinopril  and propranolol  as needed. Her blood pressure was also elevated during her hospital stay.  She was scheduled for a knee replacement surgery, which was postponed due to her current medical issues. She has old hardware in her knee from previous surgeries, and scans ruled out infection of the hardware. She experiences increased knee pain due to altered gait from her foot condition.     Physical Exam   Physical Exam Vitals reviewed.  Constitutional:      General: She is not in acute distress.    Appearance: Normal appearance. She is obese. She is not ill-appearing.  HENT:     Head: Normocephalic and atraumatic.  Cardiovascular:     Rate and Rhythm: Normal rate and regular rhythm.     Pulses: Normal pulses.     Heart sounds: Normal heart sounds. No murmur heard.    No  friction rub. No gallop.  Pulmonary:     Effort: Pulmonary effort is normal. No respiratory distress.     Breath sounds: Normal breath sounds. No wheezing.  Feet:     Comments: Left lower leg, ankle, and foot swelling with purple discoloration and tenderness to palpation at the top of the foot.  Skin:    General: Skin is warm and dry.  Neurological:     Mental Status: She is alert and oriented to person, place, and time.  Psychiatric:        Mood and Affect: Mood normal.        Behavior: Behavior normal.        Thought Content: Thought content normal.        Judgment: Judgment normal.     Assessment & Plan   Assessment and Plan    Cellulitis of right lower extremity Cellulitis confirmed with blood cultures, improving but requires continued management. Allergic reaction to cefadroxil noted. - Discontinue cefadroxil. - Prescribe doxycycline  100 mg twice daily for 7 days. - Advise elevation of the affected limb. - Instruct to wear shoes at all times. - Avoid compression therapy until cellulitis resolves.  Enlarged and tender cervical lymph nodes secondary to infection Lymphadenopathy likely secondary to infection, expected to resolve with infection treatment. - Advise warm compresses and light massage.  Oral candidiasis (thrush) Symptoms of thrush present, lidocaine  gel ineffective. - Start Nystatin  suspension four times daily prn.   Hypertension Hypertension poorly controlled, blood pressure 170/81 mmHg. Current regimen includes low dose lisinopril  and propranolol   as needed. - Increase lisinopril  to 20 mg daily. - Instruct to monitor blood pressure at home. - Schedule follow-up in 2 weeks.     Follow up   Return in about 2 weeks (around 07/22/2024) for nurse visit for BP check, make sure cellulitis is better.  __________________________________ Zada FREDRIK Palin, DNP, APRN, FNP-BC Primary Care and Sports Medicine [facility] [place]

## 2024-07-10 ENCOUNTER — Ambulatory Visit: Admitting: Professional

## 2024-07-10 ENCOUNTER — Other Ambulatory Visit: Payer: Self-pay | Admitting: Medical-Surgical

## 2024-07-11 ENCOUNTER — Other Ambulatory Visit: Payer: Self-pay | Admitting: Medical-Surgical

## 2024-07-11 ENCOUNTER — Telehealth: Payer: Self-pay

## 2024-07-11 MED ORDER — CLOTRIMAZOLE 1 % EX CREA: 1.0000 | TOPICAL_CREAM | Freq: Two times a day (BID) | CUTANEOUS | 0 refills | Status: DC

## 2024-07-11 NOTE — Progress Notes (Signed)
 Patient advised.

## 2024-07-11 NOTE — Telephone Encounter (Signed)
 Patient advised.

## 2024-07-11 NOTE — Telephone Encounter (Signed)
 Copied from CRM [id number]. Topic: Clinical - Medication Question >> Jul 10, 2024  5:19 PM Mercer PEDLAR wrote: Reason for CRM: Patient called stating that when she was discharged from the hospital last week she was prescribed Clotrimazole  cream but forgot to mention it to Ms. Willo when she saw her on 07/08/24 to check if it needs to be refilled. Patient would like callback to let her know.

## 2024-07-11 NOTE — Progress Notes (Signed)
 Clotrimazole  1% cream refill sent to the pharmacy for use under the breasts and between the toes for rash.  ___________________________________________ Zada FREDRIK Palin, DNP, APRN, FNP-BC Primary Care and Sports Medicine [facility] [place]

## 2024-07-15 ENCOUNTER — Ambulatory Visit: Payer: Self-pay | Admitting: *Deleted

## 2024-07-15 NOTE — Telephone Encounter (Signed)
 FYI Only or Action Required?: FYI only for provider.  Patient was last seen in primary care on 07/08/2024 by Willo Mini, NP.  Called Nurse Triage reporting Neck Pain.  Symptoms began yesterday.  Interventions attempted: Rest, hydration, or home remedies.  Symptoms are: rapidly worsening.  Triage Disposition: Go to ED Now (Notify PCP)  Patient/caregiver understands and will follow disposition?: Yes  Patient requesting call back that PCP agrees she should go to ED.               Copied from CRM [id number]. Topic: Clinical - Red Word Triage >> Jul 15, 2024  8:15 AM Carrielelia G wrote: [facility] that prompted transfer to Nurse Triage: stabbing neck pain , decreased range of motion Reason for Disposition  [1] Stiff neck (can't put chin to chest) AND [2] headache  Answer Assessment - Initial Assessment Questions Recommended ED . Patient requesting if PCP agrees. Due to headache neck pain can not put chin to chest possible fever go to ED. Please advise.     1. ONSET: When did the pain begin?      2 weeks ago and again yesterday worsening pain 2. LOCATION: Where does it hurt?      Right side decreased ROM in neck stabbing pain 3. PATTERN Does the pain come and go, or has it been constant since it started?      Constant  4. SEVERITY: How bad is the pain?  (Scale 0-10; or none or slight stiffness, mild, moderate, severe)     Stabbing pain right side can not look down  5. RADIATION: Does the pain go anywhere else, shoot into your arms?     Shoots down right arm, and back 6. CORD SYMPTOMS: Any weakness or numbness of the arms or legs?     No  7. CAUSE: What do you think is causing the neck pain?     Not sure  8. NECK OVERUSE: Any recent activities that involved turning or twisting the neck?     Not sure  9. OTHER SYMPTOMS: Do you have any other symptoms? (e.g., headache, fever, chest pain, difficulty breathing, neck swelling)     Right side neck pain  radiates down back. Headache, can not put chin to chest. Change in swallowing, hives recent antibiotic use, nausea. Stopped antibiotics on Sunday 10. PREGNANCY: Is there any chance you are pregnant? When was your last menstrual period?       na  Protocols used: Neck Pain or Stiffness-A-AH

## 2024-07-15 NOTE — Telephone Encounter (Signed)
 Copied from CRM [id number]. Topic: Clinical - Red Word Triage >> Jul 15, 2024  8:15 AM Marda MATSU wrote: Red Word that prompted transfer to Nurse Triage: stabbing neck pain , decreased range of motion >> Jul 15, 2024 11:00 AM Corin V wrote: Patient stated she received a call from Luke asking if she went to the emergency room. She is currently there and waiting to be seen. >> Jul 15, 2024 10:44 AM Graeme ORN wrote: Patient called back. Returned missed call. States she is currently at ED. Did not know who called. Call dropped while on hold. Thank You

## 2024-07-15 NOTE — Telephone Encounter (Signed)
Patient seen at ER

## 2024-07-17 ENCOUNTER — Ambulatory Visit: Admitting: Professional

## 2024-07-17 ENCOUNTER — Encounter: Payer: Self-pay | Admitting: Professional

## 2024-07-17 ENCOUNTER — Encounter: Payer: Self-pay | Admitting: Medical-Surgical

## 2024-07-17 DIAGNOSIS — F411 Generalized anxiety disorder: Secondary | ICD-10-CM

## 2024-07-17 DIAGNOSIS — F331 Major depressive disorder, recurrent, moderate: Secondary | ICD-10-CM | POA: Diagnosis not present

## 2024-07-17 DIAGNOSIS — F4321 Adjustment disorder with depressed mood: Secondary | ICD-10-CM

## 2024-07-17 NOTE — Progress Notes (Signed)
 [facility] Counselor/Therapist Progress Note  Patient ID: Jearldean Gutt, MRN: [id number],    Date: 07/17/2024  Time Spent:  63 minutes 1109am-1212pm  Treatment Type: Individual Therapy  Risk Assessment: Danger to Self:  No Self-injurious Behavior: No Danger to Others: No  Subjective: The patient arrived on time for her in person appointment.  Issues addressed: 1-physical a-went to ED on Saturday with symptoms of severe neck pain and was sent home but probably aggravated neck from carrying groceries, temp of 101 -she was given injection for pain and sent home b-drove self to ED due to pain that has traveled to back and a temp of 102 -the plan was discharge and when her slipper fell off and ED MD saw red foot and asked questions but had noticed no symptoms -her transportation (friend [place]) waited two hours in ED waiting room to take her home and then she learned that she was being admitted -admitted on Sunday and discharged on Friday with improved cellulitis -cellulitis crept up to behind her knee and pain was worse than her neck and back -discharged on oral antibiotics and pt was very itchy c-saw PCP on Monday for follow up or changed antibiotic and it is getting better d-back and next are awful -back to ED on Tuesday 8/18 due to severe pain -CT scan of neck and gave her two pain injections (dilaudid and toridol)  -saw pain management MD Dr. Berline on 8/20 -Dr. Berline is thinking the cellulitis caused the flair up of the back and neck pain -nothing can be done to improve the neck due to the arthritic pain just getting worse; she has increased from 2mg  bid to 2mg  4/day of Hydromorphone -pt took the 2mg  early evening but admits that the 2mg  her body has become dependent upon -Dr. Rosella, neck and back doctor, who has done the stimulator and with having the 2 MRI's several months ago she was referred to a colleague and determine to not be a candidate or neck  surgery -she will see Dr. Rosella the end of September for another neck procedure e-surgery had to be canceled -secretary hung up on her -her parting words were call up when you need up 2-depression -pt notices an improvement in mood since meeting with PA Mozingo -medication times were adjusted to better accommodate proper use -now Wellbutrin  in morning and Prozac  night -pt reports she is sleeping better and able to get up in the morning better  -improved ability to get to sleep 3-family -granddaughter's wedding in November and Christmas in December -how to cope with pain and enjoy her life -plans to be there for the wedding and Christmas holiday because she will delay the surgery  Treatment Plan Problems: Financial Stress, Low Self-Esteem, Attention Deficit Disorder (ADD) - Adult Symptoms: Unable to concentrate or pay attention to things of low interest, even when those things are important to his/her life. Restless and fidgety; unable to be sedentary for more than a short time. Disorganized in most areas of his/her life. Starts many projects but rarely finishes any. Chronic low self-esteem. Tendency toward addictive behaviors. Indebtedness and overdue bills that exceed ability to meet monthly payments. A long-term lack of discipline in money management that has led to excessive indebtedness. A pattern of impulsive spending that does not consider the eventual financial consequences. Inability to accept compliments. Makes self-disparaging remarks; sees self as unattractive, worthless, a loser, a burden, unimportant; takes blame easily. Difficulty in saying no to others; assumes not being liked by others.  Fear of rejection by others, especially peer group. Lack of any goals for life and setting of inappropriately low goals for self. Inability to identify positive characteristics of self. Anxious and uncomfortable in social situations. Goals: Reduce impulsive actions while  increasing concentration and focus on low-interest activities. Minimize ADD behavioral interference in daily life. Sustain attention and concentration for consistently longer periods of time. Achieve an inner strength to control personal impulses, cravings, and desires that directly or indirectly increase debt irresponsibly. Resolve financial crisis with a path to eliminate debt. Revise spending patterns to not exceed income. Gain a new sense of self-worth in which the substance of one's value is not attached to the capacity to do things or own things that cost money. Understand personal desires, insecurities, and anxieties that make overspending possible. Elevate self-esteem. Develop a consistent, positive self-image. Demonstrate improved self-esteem through more pride in appearance, more assertiveness, greater eye contact, and identification of positive traits in self-talk messages. Establish an inward sense of self-worth, confidence, and competence. Interact socially without undue distress or disability. Objectives target date for all objectives is 03/25/2025: Identify the current specific ADD behaviors that cause the most difficulty. List the negative consequences of the ADD problematic behavior. Learn and implement organization and planning skills. Acknowledge procrastination and the need to reduce it. Learn and implement skills to reduce procrastination. Learn and implement skills to reduce the disruptive influence of distractibility. Combine skills learned in therapy into a new daily approach to managing ADHD. Use cognitive and behavioral strategies to control the impulse to make unnecessary and unaffordable purchases. Report instances of successful control over impulse to spend on unnecessary expenses. Increase insight into the historical and current sources of low self-esteem. Decrease the frequency of negative self-descriptive statements and increase frequency of positive  self-descriptive statements. Articulate a plan to be proactive in trying to get identified needs met. Form realistic, appropriate, and attainable goals for self in all areas of life. Take verbal responsibility for accomplishments without discounting. Identify and replace negative self-talk messages used to reinforce low self-esteem. Objectives target date for all objectives is 03/13/2025: Identify the specific anxiety symptoms that are personally most disturbing or most contributing to impaired functioning. Learn and practice thought and behavioral control methods to minimize and control anxiety symptoms once they have begun. Identify specific thoughts that precipitate anxiety symptoms. Replace anxiety-producing thoughts with constructive thoughts. Verbalize an understanding of the general physical and cognitive manifestations of the causes for anxiety. Identify and clarify the patterns of anxiety precipitants and consequences. Identify whether the symptoms of depression seem to be primarily related to interpersonal relationships, stressful life events or circumstances, thoughts/beliefs, or behaviors. Verbalize an understanding of the general physical and psychological effects of depression. Replace depression-promoting thoughts with mood-elevating thoughts. Keep a daily record of mood rating from 1 to 10, noting associated behaviors, activities, events, people, and thoughts. Identify specific thoughts that precipitate depressive moods. Monitor and report depressive symptoms, completing self-report assessment on a periodic basis. Describe and evaluate significant past and current interpersonal relationships. Tell memories of the deceased person, starting with positive memories. Consider possible ways of increasing involvement with others. Accept the reality of loss and tolerate the pain of intense grief. Engage in events and activities that increase level of social involvements. Identify and  monitor specific pain triggers. Learn and implement somatic skills such as relaxation and/or biofeedback to reduce pain level. Learn mental coping skills and implement with somatic skills for managing acute pain. Interventions: Reinforce the client's belief system  and/or refer to a spiritual leader who can support the client's faith. Explore and problem-solve with the client difficulties, fears, or barriers related to increasing social involvement. Point out to the client that engagement with others is associated with decreased feelings of loneliness, grief, depression, and hopelessness. Provide encouragement and support to the client for increased engagement in contact with others. Clarify with the client that expression of negative thoughts or feelings about the deceased is not disloyal. Assist the client in remembering the loved one in realistic ways (i.e., includes attractions, positive traits, negative traits, conflicts, ambivalence, dependency, etc.), a memory that is neither idealized nor demonized, so that the client's relationship with the deceased person can be appraised realistically (or assign Dear _____: A Letter to a Lost Loved One in the Adult Psychotherapy Homework Planner, 2nd ed. by Jenniffer). Assign the client to read about progressive muscle relaxation and other calming strategies in relevant books or treatment manuals (e.g., Progressive Relaxation Training by Thornell armin Collier). Assign a homework exercise in which the client implements somatic pain management skills and records the result; review and process during the treatment session. Teach the client distraction techniques (e.g., pleasant imagery, counting techniques, alternative focal points) and how to use them for relaxation skills for the management of acute episodes of pain. Ask the client to create a list of activities that are pleasurable to him/her (or assign Identify and Schedule Pleasant Activities in the Adult  Psychotherapy Homework Planner, 2nd ed. by Jenniffer); process the list, developing a plan of increasing the frequency of engaging in the selected pleasurable activities. Teach the client problem-solving skills to apply to removal of obstacles to implementing new skills; role-play implementation of these skills to obstacles. Assist the client in identifying the most effective personal stress management techniques (e.g., prayer, walking, baking, telephoning a friend), and encourage daily scheduling of these activities (or assign Past Successful Anxiety Coping in the Adult Psychotherapy Homework Planner, 2nd ed. by Jenniffer). Teach the client the concept of finding a good match between the individual's capacities and the demands of the physical environment. If the physical environment is too demanding for the individual's capacities (e.g., frail older adult taking care of a large house), the individual can become overwhelmed and may need to move from large home to smaller accommodations or residence for older adults). While reviewing the client's anxiety symptom chart, help him/her recognize patterns associated with anxiety symptoms: sort out precipitants from consequences, identify the most intense or frequent precipitants, and identify the consequences that help to perpetuate maladaptive patterns. Assist the client in identifying his/her current attempts to reducing anxiety (e.g., constantly telephoning family or physician, making unneeded doctor appointments or going to the emergency room), their apparent positive consequences (e.g., feeling better, getting attention), but longer-term negative consequences (e.g., physician won't return calls, friends avoid the client because of expressions of worry). Teach the client that his/her mood can be improved by increasing pleasant events and decreasing unpleasant events. Encourage the client to identify pleasant events that are desirable, but are not currently a part  of his/her daily routine (see Identify and Schedule Pleasant Activities in the Adult Psychotherapy Homework Planner, 2nd ed. by Jenniffer). Develop a one-week daily schedule with the client that increases pleasant events and decreases unpleasant events, making sure to have at least one pleasant event every day. Help the client to understand his/her strengths and weaknesses in problem-solving, to view problems as challenges and not personal deficits, and to identify and define current problem(s) that will be  the focus of treatment. Help the client to identify realistic goals to address problem(s) of concern and identify major obstacles in achieving goals. Encourage the client to implement chosen solutions to current life problem(s), self-monitor and evaluate solution implementation, and reward self for efforts. Explore the relationship of negative self-talk (e.g., I'm just a burden to everyone; Everyone would be better off if I were dead) and distorted beliefs (e.g., There's no one like me living here; I'd rather never leave my room than have anyone see me in a wheelchair) to depressed mood (or assign Negative Thoughts Trigger Negative Feelings in the Adult Psychotherapy Homework Planner, 2nd ed. by Jenniffer; or Daily Record of Dysfunctional Thoughts in Cognitive Therapy of Depression by Almarie Candida Gentry and Shona). Encourage the client to distinguish between all-or-nothing thinking (e.g., Life in a nursing home can't be worth living) and genuine nuanced reflection on life's meaning and purpose (e.g., What's giving my life meaning at this stage?); confront the former, encourage the latter.  Diagnosis:Major depressive disorder, recurrent episode, moderate (HCC)  Generalized anxiety disorder  Grief  Plan:  -meet again on Thursday, July 24, 2024 at 11am in person

## 2024-07-21 ENCOUNTER — Telehealth (INDEPENDENT_AMBULATORY_CARE_PROVIDER_SITE_OTHER): Admitting: Adult Health

## 2024-07-21 ENCOUNTER — Encounter: Payer: Self-pay | Admitting: Adult Health

## 2024-07-21 DIAGNOSIS — F411 Generalized anxiety disorder: Secondary | ICD-10-CM

## 2024-07-21 DIAGNOSIS — F329 Major depressive disorder, single episode, unspecified: Secondary | ICD-10-CM | POA: Diagnosis not present

## 2024-07-21 DIAGNOSIS — G4700 Insomnia, unspecified: Secondary | ICD-10-CM | POA: Diagnosis not present

## 2024-07-21 DIAGNOSIS — F322 Major depressive disorder, single episode, severe without psychotic features: Secondary | ICD-10-CM

## 2024-07-21 MED ORDER — FLUOXETINE HCL 10 MG PO CAPS: 10.0000 mg | ORAL_CAPSULE | Freq: Every day | ORAL | 0 refills | Status: DC

## 2024-07-21 MED ORDER — TEMAZEPAM 22.5 MG PO CAPS: ORAL_CAPSULE | ORAL | 2 refills | Status: DC

## 2024-07-21 MED ORDER — DESVENLAFAXINE SUCCINATE ER 100 MG PO TB24: 100.0000 mg | ORAL_TABLET | Freq: Every day | ORAL | 0 refills | Status: DC

## 2024-07-21 NOTE — Addendum Note (Signed)
 Addended by: LAWERANCE ANGELINE SAILOR on: 07/21/2024 02:31 PM   Modules accepted: Level of Service

## 2024-07-21 NOTE — Progress Notes (Signed)
 Thompson Thompson [id number] 1951/04/11 73 y.o.  Virtual Visit via Video Note  I connected with pt @ on 07/21/24 at  2:00 PM EDT by a video enabled telemedicine application and verified that I am speaking with the correct person using two identifiers.   I discussed the limitations of evaluation and management by telemedicine and the availability of in person appointments. The patient expressed understanding and agreed to proceed.  I discussed the assessment and treatment plan with the patient. The patient was provided an opportunity to ask questions and all were answered. The patient agreed with the plan and demonstrated an understanding of the instructions.   The patient was advised to call back or seek an in-person evaluation if the symptoms worsen or if the condition fails to improve as anticipated.  I provided 25 minutes of non-face-to-face time during this encounter.  The patient was located at home.  The provider was located at [facility].   Angeline LOISE Sayers, NP   Subjective:   Patient ID:  Thompson Thompson is a 73 y.o. (DOB 1951-03-16) female.  Chief Complaint: No chief complaint on file.   HPI Shamar Kracke presents for follow-up of MDD, GAD and insomnia.   Reports recent hospitalization for cellulitis.   Describes mood today as ok. Pleasant. Mood symptoms - reports some depression, anxiety and irritability - just my normal. Reports stable interest and motivation. Denies panic attacks. Denies worry, over thinking and ruminating. Reports mood is stable. Reports feeling a little bummed with recent hospitalization - missed her granddaughter's wedding shower. Reports a lifetime of chronic pain issues - surgeries - multiple medical concerns. She reports taking current medications as prescribed. She is willing to consider other options. Energy levels lower. Active, does not have a regular exercise routine with physical limitations.  Enjoys some usual interests and activities.  Lives in a duplex. Born [place] raised [place]. Appetite adequate. Reports weight loss. Weight today - 195 pounds. Reports gastric bypass 27 years ago. Sleeps better some nights than others. Averages 10 to 12 hours. Reports morning and afternoon naps. Reports focus and concentration stable. Completing tasks. Managing aspects of household.  Denies SI or HI.  Denies AH or VH. Denies self harm.  Denies substance use. Denies alcohol use.  Previous medication trials: Trazadone - suicidal thoughts  Review of Systems:  Review of Systems  Musculoskeletal:  Negative for gait problem.  Neurological:  Negative for tremors.  Psychiatric/Behavioral:         Please refer to HPI    Medications: I have reviewed the patient's current medications.  Current Outpatient Medications  Medication Sig Dispense Refill   buPROPion  (WELLBUTRIN  XL) 300 MG 24 hr tablet Take 1 tablet (300 mg total) by mouth daily. Take in the evening (Patient taking differently: Take 300 mg by mouth in the morning.) 90 tablet 0   celecoxib  (CELEBREX ) 200 MG capsule TAKE 1-2 CAPSULES BY MOUTH DAILY AS NEEDED FOR PAIN 60 capsule 2   clotrimazole  (LOTRIMIN ) 1 % cream Apply 1 Application topically 2 (two) times daily. Apply under breasts and between toes. 113 g 0   desvenlafaxine  (PRISTIQ ) 100 MG 24 hr tablet Take 1 tablet (100 mg total) by mouth daily. 30 tablet 1   FLUoxetine  (PROZAC ) 10 MG capsule Take 1 capsule (10 mg total) by mouth daily. (Patient taking differently: Take 10 mg by mouth at bedtime.) 90 capsule 0   gabapentin  (NEURONTIN ) 600 MG tablet Take 1 tablet (600 mg total) by mouth 3 (three) times daily. (Patient  taking differently: Take 600 mg by mouth daily as needed (pain).) 90 tablet 3   HYDROmorphone (DILAUDID) 2 MG tablet Take 2 mg by mouth 2 (two) times daily as needed for severe pain (pain score 7-10).     lisinopril  (ZESTRIL ) 20 MG tablet Take 1 tablet (20 mg total) by mouth daily. 90 tablet 3   Magnesium 250 MG TABS  Take 250 mg by mouth at bedtime.     nystatin  (MYCOSTATIN ) 100000 UNIT/ML suspension Take 5 mLs (500,000 Units total) by mouth 4 (four) times daily. Swish for 30 seconds and spit out. 180 mL 0   nystatin  (MYCOSTATIN /NYSTOP ) powder APPLY TO THE AFFECTED AREA(S) EXTERNALLY TWICE DAILY (Patient taking differently: Apply 1 Application topically daily as needed (yeast).) 30 g 0   propranolol  (INDERAL ) 20 MG tablet TAKE 1 TABLET(20 MG) BY MOUTH DAILY AS NEEDED 90 tablet 0   solifenacin  (VESICARE ) 10 MG tablet Take 1 tablet (10 mg total) by mouth daily. (Patient taking differently: Take 10 mg by mouth at bedtime.) 30 tablet 11   temazepam  (RESTORIL ) 22.5 MG capsule TAKE ONE CAPSULE BY MOUTH AT NIGHT 14 capsule 0   tiZANidine  (ZANAFLEX ) 2 MG tablet TAKE 1 TABLET (2 MG TOTAL) BY MOUTH IN THE MORNING AND AT BEDTIME. (Patient taking differently: Take 2 mg by mouth 2 (two) times daily as needed for muscle spasms.) 180 tablet 1   traMADol  (ULTRAM ) 50 MG tablet Take 50 mg by mouth every 6 (six) hours as needed for moderate pain (pain score 4-6).     No current facility-administered medications for this visit.    Medication Side Effects: None  Allergies:  Allergies  Allergen Reactions   Compazine [Prochlorperazine] Anaphylaxis   Phenothiazines Anaphylaxis and Swelling    Caused her throat to close     Prednisone Itching, Swelling and Rash    Facial swelling   Pregabalin Itching and Swelling    Swelling and itching of hands and feet.      Cefadroxil Itching    Past Medical History:  Diagnosis Date   Abnormality of gait 04/23/2019   Acquired hypothyroidism 07/29/2021   Allergic rhinitis due to animal hair and dander 12/02/2015   Formatting of this note might be different from the original.  [facility]  Seen by Dr. Leita 11/11/15     Plan- Received allergy shots today. Follow up 1 year.     Allergy    Anemia    Anemia    Anxiety    Arthritis of  scaphoid-trapezium-trapezoid joint of right hand 09/10/2023   B12 deficiency    Back pain    Chronic fatigue 07/29/2021   Chronic fatigue syndrome    Chronic throat clearing 03/14/2023   Clotting disorder (HCC)    Constipation    Depression with anxiety 04/07/2009   Formatting of this note might be different from the original.  well controlled on current meds, follows with psychiatrist in [place]. Had a   h/o severe depression 5 years ago, and had ECT.     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  B12 deficiency   Iron deficiency     Herniated lumbar  intervertebral disc 11/12/2020   High cholesterol    History of blood clots    History of cervical spinal surgery 01/01/2018   Formatting of this note might be different from the original.  Fusion C6 and C7 per 2018 MRI report.     History of left hip replacement 01/14/2018   History of Roux-en-Y gastric bypass    Hoarseness 03/14/2023   Hyperlipidemia 08/19/2010   Hypertension    Hyponatremia 01/17/2018   Hypothyroidism    Hypothyroidism    IBS (irritable bowel syndrome) 04/06/2021   Impaired fasting glucose 08/19/2010   Incontinence 10/30/2013   Formatting of this note might be different from the original. Formatting of this note might be different from the original. Urinary Incontinence     Infertility, female    Insomnia 04/07/2009   Formatting of this note might be different from the original.  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  Followed  by [facility]  Seen by Dr. Linnette 03/01/15     S/p changing thoracic spinal cord stimulator generator 08/18/14.     Lumbar spine films show spinal cord generator in left flank area. 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  Follows with Rheumatology in [facility] of this note might be different from the original.  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  Getting iron infusion via port at hematalogy/oncology center at [facility].   Was recently hospitalized in [facility] for severe anemia     Formatting of this note might be different from the original.  on OTC b12 currently     Vitamin D  deficiency     Family History  Problem Relation Age of Onset   Hyperlipidemia Mother    Hypertension Mother    Heart attack Mother    Heart disease Mother    Sudden death Mother    Depression Mother    Anxiety disorder Mother    Obesity Mother    Anxiety disorder Father    Depression Father    Cancer Father    Hyperlipidemia Father    Hypertension Father    Lung cancer Father    Prostate cancer Father    Kidney cancer Brother    Healthy Daughter    Healthy Son     Social History   Socioeconomic History   Marital status: Widowed    Spouse name: Not on file   Number of children: 2   Years of education: 60   Highest education level: 12th grade  Occupational History    Comment: Retired.   Occupation: Retired  Tobacco Use   Smoking status: Never    Passive exposure: Past   Smokeless tobacco: Never  Vaping Use   Vaping status: Never Used  Substance and Sexual Activity   Alcohol use: Yes    Comment: rarely   Drug use: Never   Sexual activity: Not Currently  Other Topics Concern   Not on file  Social History Narrative   Lives alone. She has two children and 4 grandchildren. She stays active, enjoys doing crafts and painting.    Social Drivers of Corporate investment banker Strain: Low Risk   (08/27/2023)   Received from [facility]   Overall Financial Resource Strain (CARDIA)    Difficulty of Paying Living Expenses: Not hard at all  Food Insecurity: No Food Insecurity (06/30/2024)   Received from [facility]   Hunger Vital Sign    Within the past 12 months, you worried that your food would run out before you got the money to buy more.: Never true    Within the past 12 months, the food you bought just didn't last and you didn't have money to get more.: Never true  Transportation Needs: No Transportation Needs (07/04/2024)   Received from [facility] - Transportation    In the past 12 months, has lack of transportation kept you from medical appointments or from getting medications?: No    In the past 12 months, has lack of transportation kept you from meetings, work, or from getting things needed for daily living?: No  Physical Activity: Inactive (01/09/2022)   Exercise Vital Sign    Days of Exercise per Week: 0 days    Minutes of Exercise per Session: 0 min  Stress: No Stress Concern Present (06/30/2024)   Received from [facility] - Occupational Stress Questionnaire    Do you feel stress - tense, restless, nervous, or anxious, or unable to sleep at night because your mind is troubled all the time - these days?: Not at all  Social Connections: Unknown (03/26/2023)   Received from [facility]   Social Network    Social Network: Not on file  Intimate Partner Violence: Not At Risk (07/15/2024)   Received  from [facility]   HITS    Over the last 12 months how often did your partner physically hurt you?: Never    Over the last 12 months how often did your partner insult you or talk down to you?: Never    Over the last 12 months how often did your partner threaten you with physical harm?: Never    Over the last 12 months how often did your partner scream or curse at you?: Never    Past Medical History, Surgical history,  Social history, and Family history were reviewed and updated as appropriate.   Please see review of systems for further details on the patient's review from today.   Objective:   Physical Exam:  There were no vitals taken for this visit.  Physical Exam Constitutional:      General: She is not in acute distress. Musculoskeletal:        General: No deformity.  Neurological:     Mental Status: She is alert and oriented to person, place, and time.     Coordination: Coordination normal.  Psychiatric:        Attention and Perception: Attention and perception normal. She does not perceive auditory or visual hallucinations.        Mood and Affect: Mood normal. Mood is not anxious or depressed. Affect is not labile, blunt, angry or inappropriate.        Speech: Speech normal.        Behavior: Behavior normal.        Thought Content: Thought content normal. Thought content is not paranoid or delusional. Thought content does not include homicidal or suicidal ideation. Thought content does not include homicidal or suicidal plan.        Cognition and Memory: Cognition and memory normal.        Judgment: Judgment normal.     Comments: Insight intact     Lab Review:     Component Value Date/Time   NA 136 08/14/2023 1357   K 4.8 08/14/2023 1357   CL 95 (L) 08/14/2023 1357   CO2 26 08/14/2023 1357   GLUCOSE 101 (H) 08/14/2023 1357   GLUCOSE 92 02/20/2023 1128   BUN 17 08/14/2023 1357   CREATININE 0.68 08/14/2023 1357   CREATININE 0.80 02/20/2023 1128   CALCIUM 9.6 08/14/2023 1357   PROT 6.9 08/14/2023 1357   ALBUMIN 4.2 08/14/2023 1357   AST 30 08/14/2023 1357   AST 30 09/07/2021 1433   ALT 29 08/14/2023 1357   ALT 31 09/07/2021 1433   ALKPHOS 107 08/14/2023 1357   BILITOT 0.4 08/14/2023 1357   BILITOT 0.3 09/07/2021 1433   GFRNONAA >60 09/07/2021 1433       Component Value Date/Time   WBC 5.6 01/07/2024 1315   WBC 4.7 12/19/2022 1157   RBC 3.85 (L) 01/07/2024 1315   RBC  3.82 (L) 01/07/2024 1315   HGB 11.5 (L) 01/07/2024 1315   HCT 35.9 (L) 01/07/2024 1315   PLT 242 01/07/2024 1315   MCV 93.2 01/07/2024 1315   MCH 29.9 01/07/2024 1315   MCHC 32.0 01/07/2024 1315   RDW 13.2 01/07/2024 1315   LYMPHSABS 1.6 01/07/2024 1315   MONOABS 0.6 01/07/2024 1315   EOSABS 0.3 01/07/2024 1315   BASOSABS 0.0 01/07/2024 1315    No results found for: POCLITH, LITHIUM   No results found for: PHENYTOIN, PHENOBARB, VALPROATE, CBMZ   .res Assessment: Plan:    Treatment Plan/Recommendations:   Plan:  PDMP reviewed  Pristiq   100mg  daily  Prozac  10mg  daily  Wellbutrin  XL 300mg  in the morning Restoril  22.5mg  at bedtime   Working w PCP for elevated BP  RTC 4 weeks  25 minutes spent dedicated to the care of this patient on the date of this encounter to include pre-visit review of records, ordering of medication, post visit documentation, and face-to-face time with the patient discussing MDD, GAD and insomnia. Discussed continuing current medication regimen.  Patient advised to contact office with any questions, adverse effects, or acute worsening in signs and symptoms.  There are no diagnoses linked to this encounter.   Please see After Visit Summary for patient specific instructions.  Future Appointments  Date Time Provider Department Center  07/21/2024  2:00 PM Gidget Quizhpi, Angeline Mattocks, NP CP-CP None  07/22/2024 11:00 AM [facility] CLINICAL SUPPORT PCK-PCK None  07/24/2024 11:00 AM [facility], [facility] None  08/11/2024  1:30 PM [facility] LAB [facility] None  08/11/2024  1:45 PM [facility] INJ NURSE [facility] None  08/11/2024  2:00 PM Franchot Lauraine HERO, NP [facility] None  08/15/2024 10:00 AM [facility], [facility] None  08/21/2024  4:00 PM [facility] Service, [facility] None  08/25/2024 11:00 AM [facility] Service, [facility] None  09/01/2024 10:45 AM Roseann, Adine PARAS., MD AUR-HP None  09/02/2024  1:00 PM [facility] Service, [facility] None   09/11/2024 10:00 AM Ger[facility] Service, [facility] None  09/18/2024 12:00 PM Ger[facility] Service, [facility] None  09/25/2024 11:00 AM [facility] Service, [facility] None  10/07/2024  1:30 PM Geralene Kaiser, MD BH-BHKA None    No orders of the defined types were placed in this encounter.     -------------------------------

## 2024-07-22 ENCOUNTER — Ambulatory Visit (INDEPENDENT_AMBULATORY_CARE_PROVIDER_SITE_OTHER)

## 2024-07-22 ENCOUNTER — Other Ambulatory Visit: Payer: Self-pay | Admitting: Medical-Surgical

## 2024-07-22 VITALS — BP 148/79 | HR 60 | Resp 20

## 2024-07-22 DIAGNOSIS — I10 Essential (primary) hypertension: Secondary | ICD-10-CM

## 2024-07-22 NOTE — Progress Notes (Signed)
   Subjective:    Patient ID: Heather Thompson, female    DOB: 1951/10/02, 74 y.o.   MRN: [id number]  HPI Patient is here for blood pressure. Denies trouble sleeping, palpitations or medication problems.   Review of Systems     Objective:   Physical Exam        Assessment & Plan:   Cellulitis is much better. Not 100% Still very swollen. Can get crocs on but still very tight. Slight feeling in big toe. Good feeling on top.  Patient advised to schedule a follow up with PCP.

## 2024-07-23 ENCOUNTER — Ambulatory Visit: Admitting: Physical Therapy

## 2024-07-24 ENCOUNTER — Ambulatory Visit (INDEPENDENT_AMBULATORY_CARE_PROVIDER_SITE_OTHER): Admitting: Professional

## 2024-07-24 ENCOUNTER — Encounter: Payer: Self-pay | Admitting: Professional

## 2024-07-24 DIAGNOSIS — F322 Major depressive disorder, single episode, severe without psychotic features: Secondary | ICD-10-CM

## 2024-07-24 DIAGNOSIS — F411 Generalized anxiety disorder: Secondary | ICD-10-CM | POA: Diagnosis not present

## 2024-07-24 NOTE — Progress Notes (Signed)
 [facility] Counselor/Therapist Progress Note  Patient ID: Heather Thompson, MRN: [id number],    Date: 07/24/2024  Time Spent:  50 minutes 1102-1152am  Treatment Type: Individual Therapy  Risk Assessment: Danger to Self:  No Self-injurious Behavior: No Danger to Others: No  Subjective: This session was held via video teletherapy. The patient consented to video teletherapy and was located in her home during this session. She is aware it is the responsibility of the patient to secure confidentiality on her end of the session. The provider was in a private home office for the duration of this session.    The patient arrived on time for her Caregility appointment.  Issues addressed: 1-appointment -had made plans to be at [facility] appointment today -had placed out her clothing and got her shower -woke up this morning physical a-pt is not feeling well due to neck pain b-saw pain mgmt MD Dr. Berline last week on two occasions (second after call from [facility] office) -Dr. Garvin increased her hydromorphone from 2mg  bid to 2mg  2 tabs bid c-appt with [facility] was canceled because they were no longer going to see her because of her neck pain -he will continue to treat her for her bursa injections -Dr. Rosella could no longer provider any help with the neck pain -based on new MRI's there was nothing more that he could do for her neck d-pt reports she is able to differentiate between physical and emotional pain -pt reports since she carried groceries the day before hospital admission she has had increased intense pain that does not improve -she said the neck pain is physical pain 2-emotional -aging is her hardest to accept -life went by too quick -pt thought she would die first given her medical history -pt having to learn how to take care of herself which has been quite the learning  Treatment Plan Problems: Financial Stress, Low Self-Esteem, Attention Deficit Disorder (ADD)  - Adult Symptoms: Unable to concentrate or pay attention to things of low interest, even when those things are important to his/her life. Restless and fidgety; unable to be sedentary for more than a short time. Disorganized in most areas of his/her life. Starts many projects but rarely finishes any. Chronic low self-esteem. Tendency toward addictive behaviors. Indebtedness and overdue bills that exceed ability to meet monthly payments. A long-term lack of discipline in money management that has led to excessive indebtedness. A pattern of impulsive spending that does not consider the eventual financial consequences. Inability to accept compliments. Makes self-disparaging remarks; sees self as unattractive, worthless, a loser, a burden, unimportant; takes blame easily. Difficulty in saying no to others; assumes not being liked by others. Fear of rejection by others, especially peer group. Lack of any goals for life and setting of inappropriately low goals for self. Inability to identify positive characteristics of self. Anxious and uncomfortable in social situations. Goals: Reduce impulsive actions while increasing concentration and focus on low-interest activities. Minimize ADD behavioral interference in daily life. Sustain attention and concentration for consistently longer periods of time. Achieve an inner strength to control personal impulses, cravings, and desires that directly or indirectly increase debt irresponsibly. Resolve financial crisis with a path to eliminate debt. Revise spending patterns to not exceed income. Gain a new sense of self-worth in which the substance of one's value is not attached to the capacity to do things or own things that cost money. Understand personal desires, insecurities, and anxieties that make overspending possible. Elevate self-esteem. Develop a consistent, positive self-image. Demonstrate improved  self-esteem through more pride in appearance, more  assertiveness, greater eye contact, and identification of positive traits in self-talk messages. Establish an inward sense of self-worth, confidence, and competence. Interact socially without undue distress or disability. Objectives target date for all objectives is 03/25/2025: Identify the current specific ADD behaviors that cause the most difficulty. List the negative consequences of the ADD problematic behavior. Learn and implement organization and planning skills. Acknowledge procrastination and the need to reduce it. Learn and implement skills to reduce procrastination. Learn and implement skills to reduce the disruptive influence of distractibility. Combine skills learned in therapy into a new daily approach to managing ADHD. Use cognitive and behavioral strategies to control the impulse to make unnecessary and unaffordable purchases. Report instances of successful control over impulse to spend on unnecessary expenses. Increase insight into the historical and current sources of low self-esteem. Decrease the frequency of negative self-descriptive statements and increase frequency of positive self-descriptive statements. Articulate a plan to be proactive in trying to get identified needs met. Form realistic, appropriate, and attainable goals for self in all areas of life. Take verbal responsibility for accomplishments without discounting. Identify and replace negative self-talk messages used to reinforce low self-esteem. Objectives target date for all objectives is 03/13/2025: Identify the specific anxiety symptoms that are personally most disturbing or most contributing to impaired functioning. Learn and practice thought and behavioral control methods to minimize and control anxiety symptoms once they have begun. Identify specific thoughts that precipitate anxiety symptoms. Replace anxiety-producing thoughts with constructive thoughts. Verbalize an understanding of the general physical and  cognitive manifestations of the causes for anxiety. Identify and clarify the patterns of anxiety precipitants and consequences. Identify whether the symptoms of depression seem to be primarily related to interpersonal relationships, stressful life events or circumstances, thoughts/beliefs, or behaviors. Verbalize an understanding of the general physical and psychological effects of depression. Replace depression-promoting thoughts with mood-elevating thoughts. Keep a daily record of mood rating from 1 to 10, noting associated behaviors, activities, events, people, and thoughts. Identify specific thoughts that precipitate depressive moods. Monitor and report depressive symptoms, completing self-report assessment on a periodic basis. Describe and evaluate significant past and current interpersonal relationships. Tell memories of the deceased person, starting with positive memories. Consider possible ways of increasing involvement with others. Accept the reality of loss and tolerate the pain of intense grief. Engage in events and activities that increase level of social involvements. Identify and monitor specific pain triggers. Learn and implement somatic skills such as relaxation and/or biofeedback to reduce pain level. Learn mental coping skills and implement with somatic skills for managing acute pain. Interventions: Reinforce the client's belief system and/or refer to a spiritual leader who can support the client's faith. Explore and problem-solve with the client difficulties, fears, or barriers related to increasing social involvement. Point out to the client that engagement with others is associated with decreased feelings of loneliness, grief, depression, and hopelessness. Provide encouragement and support to the client for increased engagement in contact with others. Clarify with the client that expression of negative thoughts or feelings about the deceased is not disloyal. Assist the  client in remembering the loved one in realistic ways (i.e., includes attractions, positive traits, negative traits, conflicts, ambivalence, dependency, etc.), a memory that is neither idealized nor demonized, so that the client's relationship with the deceased person can be appraised realistically (or assign Dear _____: A Letter to a Lost Loved One in the Adult Psychotherapy Homework Planner, 2nd ed. by Jenniffer). Assign the client  to read about progressive muscle relaxation and other calming strategies in relevant books or treatment manuals (e.g., Progressive Relaxation Training by Thornell armin Collier). Assign a homework exercise in which the client implements somatic pain management skills and records the result; review and process during the treatment session. Teach the client distraction techniques (e.g., pleasant imagery, counting techniques, alternative focal points) and how to use them for relaxation skills for the management of acute episodes of pain. Ask the client to create a list of activities that are pleasurable to him/her (or assign Identify and Schedule Pleasant Activities in the Adult Psychotherapy Homework Planner, 2nd ed. by Jenniffer); process the list, developing a plan of increasing the frequency of engaging in the selected pleasurable activities. Teach the client problem-solving skills to apply to removal of obstacles to implementing new skills; role-play implementation of these skills to obstacles. Assist the client in identifying the most effective personal stress management techniques (e.g., prayer, walking, baking, telephoning a friend), and encourage daily scheduling of these activities (or assign Past Successful Anxiety Coping in the Adult Psychotherapy Homework Planner, 2nd ed. by Jenniffer). Teach the client the concept of finding a good match between the individual's capacities and the demands of the physical environment. If the physical environment is too demanding for the  individual's capacities (e.g., frail older adult taking care of a large house), the individual can become overwhelmed and may need to move from large home to smaller accommodations or residence for older adults). While reviewing the client's anxiety symptom chart, help him/her recognize patterns associated with anxiety symptoms: sort out precipitants from consequences, identify the most intense or frequent precipitants, and identify the consequences that help to perpetuate maladaptive patterns. Assist the client in identifying his/her current attempts to reducing anxiety (e.g., constantly telephoning family or physician, making unneeded doctor appointments or going to the emergency room), their apparent positive consequences (e.g., feeling better, getting attention), but longer-term negative consequences (e.g., physician won't return calls, friends avoid the client because of expressions of worry). Teach the client that his/her mood can be improved by increasing pleasant events and decreasing unpleasant events. Encourage the client to identify pleasant events that are desirable, but are not currently a part of his/her daily routine (see Identify and Schedule Pleasant Activities in the Adult Psychotherapy Homework Planner, 2nd ed. by Jenniffer). Develop a one-week daily schedule with the client that increases pleasant events and decreases unpleasant events, making sure to have at least one pleasant event every day. Help the client to understand his/her strengths and weaknesses in problem-solving, to view problems as challenges and not personal deficits, and to identify and define current problem(s) that will be the focus of treatment. Help the client to identify realistic goals to address problem(s) of concern and identify major obstacles in achieving goals. Encourage the client to implement chosen solutions to current life problem(s), self-monitor and evaluate solution implementation, and reward self for  efforts. Explore the relationship of negative self-talk (e.g., I'm just a burden to everyone; Everyone would be better off if I were dead) and distorted beliefs (e.g., There's no one like me living here; I'd rather never leave my room than have anyone see me in a wheelchair) to depressed mood (or assign Negative Thoughts Trigger Negative Feelings in the Adult Psychotherapy Homework Planner, 2nd ed. by Jenniffer; or Daily Record of Dysfunctional Thoughts in Cognitive Therapy of Depression by Almarie Candida Gentry and Shona). Encourage the client to distinguish between all-or-nothing thinking (e.g., Life in a nursing home can't be worth  living) and genuine nuanced reflection on life's meaning and purpose (e.g., What's giving my life meaning at this stage?); confront the former, encourage the latter.  Diagnosis:Severe major depression (HCC)  Generalized anxiety disorder  Plan:  -meet again on Monday, August 04, 2024 at [employer]

## 2024-07-25 ENCOUNTER — Ambulatory Visit: Admitting: Physical Therapy

## 2024-07-29 ENCOUNTER — Encounter: Payer: Self-pay | Admitting: Medical-Surgical

## 2024-07-29 ENCOUNTER — Encounter: Admitting: Physical Therapy

## 2024-07-29 ENCOUNTER — Ambulatory Visit (INDEPENDENT_AMBULATORY_CARE_PROVIDER_SITE_OTHER): Admitting: Medical-Surgical

## 2024-07-29 ENCOUNTER — Encounter: Payer: Self-pay | Admitting: Sports Medicine

## 2024-07-29 VITALS — BP 136/68 | HR 60 | Resp 20 | Ht 64.0 in | Wt 201.0 lb

## 2024-07-29 DIAGNOSIS — R600 Localized edema: Secondary | ICD-10-CM

## 2024-07-29 DIAGNOSIS — I10 Essential (primary) hypertension: Secondary | ICD-10-CM | POA: Diagnosis not present

## 2024-07-29 DIAGNOSIS — G894 Chronic pain syndrome: Secondary | ICD-10-CM

## 2024-07-29 DIAGNOSIS — F419 Anxiety disorder, unspecified: Secondary | ICD-10-CM

## 2024-07-29 MED ORDER — PROPRANOLOL HCL ER 60 MG PO CP24: 60.0000 mg | ORAL_CAPSULE | Freq: Every day | ORAL | 3 refills | Status: DC

## 2024-07-29 MED ORDER — CARBAMAZEPINE ER 100 MG PO TB12: 100.0000 mg | ORAL_TABLET | Freq: Two times a day (BID) | ORAL | 0 refills | Status: DC

## 2024-07-29 NOTE — Progress Notes (Signed)
 Established patient visit   History of Present Illness   Discussed the use of AI scribe software for clinical note transcription with the patient, who gave verbal consent to proceed.  History of Present Illness   Heather Thompson is a 73 year old female who presents with concerns about blood pressure fluctuations and persistent neck pain.  Blood pressure variability - Fluctuating blood pressure readings at home, with morning measurements in the 160s/80s at times - Possible that high sodium intake may contribute to variability - Currently taking lisinopril  20 mg daily for blood pressure control  Cervical pain - Persistent sharp and steady pain on the right side of the neck, radiating from the top of the neck - Pain present from morning until night - No improvement with increased hydromorphone use from 2 mg twice daily to 2 mg four times daily - Gabapentin  600 mg three times daily has been ineffective - MRI indicated neck pathology  Anxiety symptoms - History of anxiety - Uses propranolol  20mg  daily as needed - Uncertain about current usage instructions for propranolol   Lower extremity edema - Persistent left foot swelling attributed to cellulitis - Received antibiotics in the hospital but was unable to tolerate two different oral antibiotics after discharge - Swelling worsens throughout the day - No use of compression therapy       Physical Exam   Physical Exam Vitals reviewed.  Constitutional:      General: She is not in acute distress.    Appearance: Normal appearance.  HENT:     Head: Normocephalic and atraumatic.  Cardiovascular:     Rate and Rhythm: Normal rate and regular rhythm.     Pulses: Normal pulses.     Heart sounds: Normal heart sounds. No murmur heard.    No friction rub. No gallop.  Pulmonary:     Effort: Pulmonary effort is normal. No respiratory distress.     Breath sounds: Normal breath sounds. No wheezing.  Musculoskeletal:     Left lower  leg: Edema present.  Skin:    General: Skin is warm and dry.  Neurological:     Mental Status: She is alert and oriented to person, place, and time.  Psychiatric:        Mood and Affect: Mood normal.        Behavior: Behavior normal.        Thought Content: Thought content normal.        Judgment: Judgment normal.    Assessment & Plan   Assessment and Plan    Hypertension and blood pressure variability Blood pressure shows variability with higher afternoon readings. Current lisinopril  and propranolol  as needed for anxiety may aid control. - Continue lisinopril  20 mg daily. - Switch propranolol  to extended-release for improved control. - Monitor blood pressure randomly without stress.  Generalized anxiety disorder Anxiety managed with propranolol  as needed, primarily daytime occurrence. - Switch propranolol  to extended-release for improved control.  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 Consider carbamazepine  for better control. - Start carbamazepine  100 mg twice daily. - Monitor for sedation with gabapentin . - Consider reducing gabapentin  if carbamazepine  effective.  Chronic right lower extremity lymphedema with residual swelling post-cellulitis Residual swelling post-cellulitis, no active infection signs. Suspected fluid retention. - Use compression socks when out of bed. - Consider Ace Wrap from toe to mid-shin if compression socks unavailable. - ACE wrap applied in office today.     Follow up  Return in about 2 weeks (around 08/12/2024) for BP/mood/neck pain follow up. __________________________________ Zada FREDRIK Palin, DNP, APRN, FNP-BC Primary Care and Sports Medicine [facility] [place]

## 2024-07-29 NOTE — Patient Instructions (Addendum)
 Stop Propranolol  20mg  daily as needed. START Propranolol  ER 60mg  once daily.   START carbamazepine  100mg  twice daily for neck pain.  For the left foot, wear a compression sock or wrap it with an ACE wrap when out of bed.

## 2024-08-01 ENCOUNTER — Encounter: Admitting: Physical Therapy

## 2024-08-04 ENCOUNTER — Encounter: Payer: Self-pay | Admitting: Professional

## 2024-08-04 ENCOUNTER — Ambulatory Visit (INDEPENDENT_AMBULATORY_CARE_PROVIDER_SITE_OTHER): Admitting: Professional

## 2024-08-04 DIAGNOSIS — F411 Generalized anxiety disorder: Secondary | ICD-10-CM | POA: Diagnosis not present

## 2024-08-04 DIAGNOSIS — F4321 Adjustment disorder with depressed mood: Secondary | ICD-10-CM | POA: Diagnosis not present

## 2024-08-04 DIAGNOSIS — F331 Major depressive disorder, recurrent, moderate: Secondary | ICD-10-CM | POA: Diagnosis not present

## 2024-08-04 NOTE — Progress Notes (Signed)
 [facility] Counselor/Therapist Progress Note  Patient ID: Heather Thompson, MRN: [id number],    Date: 08/04/2024  Time Spent:  49 minutes 809-857am  Treatment Type: Individual Therapy  Risk Assessment: Danger to Self:  No Self-injurious Behavior: No Danger to Others: No  Subjective: This session was held via video teletherapy. The patient consented to video teletherapy and was located in her home during this session. She is aware it is the responsibility of the patient to secure confidentiality on her end of the session. The provider was in a private home office for the duration of this session.    The patient arrived late for her Caregility appointment.  Issues addressed: 1-physical a-foot is much better b-knee replacement will occur after holidays c-neck and back pain are what it is, nobody's going to do anything about it so I'm just going to have to live with it -pt reports she is trying to limit the pain medication -she said she was wrong how she explained her pain medication change -she is to take up to four pills daily -pt admits whatever the future is she has no control 2-social -tends to be more isolated due to the pain since she doesn't feel well -women's in widow's group have talked about lack of purpose -difference between alone and lonely -loss of husband and lived distance from children -I don't know why I can't make this home, this will never be home -she has friends she sees a couple times per week 3-grief -pt doesn't feel whole -wonders it she will know her husband in heaven -focus on the life not the loss -pt admits she needs to do this -she talks to her husband daily and admits she tells him all the problems instead of thinking of all the good times -she admits she needs to focus on the positives  Treatment Plan Problems: Financial Stress, Low Self-Esteem, Attention Deficit Disorder (ADD) - Adult Symptoms: Unable to concentrate or pay  attention to things of low interest, even when those things are important to his/her life. Restless and fidgety; unable to be sedentary for more than a short time. Disorganized in most areas of his/her life. Starts many projects but rarely finishes any. Chronic low self-esteem. Tendency toward addictive behaviors. Indebtedness and overdue bills that exceed ability to meet monthly payments. A long-term lack of discipline in money management that has led to excessive indebtedness. A pattern of impulsive spending that does not consider the eventual financial consequences. Inability to accept compliments. Makes self-disparaging remarks; sees self as unattractive, worthless, a loser, a burden, unimportant; takes blame easily. Difficulty in saying no to others; assumes not being liked by others. Fear of rejection by others, especially peer group. Lack of any goals for life and setting of inappropriately low goals for self. Inability to identify positive characteristics of self. Anxious and uncomfortable in social situations. Goals: Reduce impulsive actions while increasing concentration and focus on low-interest activities. Minimize ADD behavioral interference in daily life. Sustain attention and concentration for consistently longer periods of time. Achieve an inner strength to control personal impulses, cravings, and desires that directly or indirectly increase debt irresponsibly. Resolve financial crisis with a path to eliminate debt. Revise spending patterns to not exceed income. Gain a new sense of self-worth in which the substance of one's value is not attached to the capacity to do things or own things that cost money. Understand personal desires, insecurities, and anxieties that make overspending possible. Elevate self-esteem. Develop a consistent, positive self-image. Demonstrate improved self-esteem  through more pride in appearance, more assertiveness, greater eye contact, and  identification of positive traits in self-talk messages. Establish an inward sense of self-worth, confidence, and competence. Interact socially without undue distress or disability. Objectives target date for all objectives is 03/25/2025: Identify the current specific ADD behaviors that cause the most difficulty. List the negative consequences of the ADD problematic behavior. Learn and implement organization and planning skills. Acknowledge procrastination and the need to reduce it. Learn and implement skills to reduce procrastination. Learn and implement skills to reduce the disruptive influence of distractibility. Combine skills learned in therapy into a new daily approach to managing ADHD. Use cognitive and behavioral strategies to control the impulse to make unnecessary and unaffordable purchases. Report instances of successful control over impulse to spend on unnecessary expenses. Increase insight into the historical and current sources of low self-esteem. Decrease the frequency of negative self-descriptive statements and increase frequency of positive self-descriptive statements. Articulate a plan to be proactive in trying to get identified needs met. Form realistic, appropriate, and attainable goals for self in all areas of life. Take verbal responsibility for accomplishments without discounting. Identify and replace negative self-talk messages used to reinforce low self-esteem. Objectives target date for all objectives is 03/13/2025: Identify the specific anxiety symptoms that are personally most disturbing or most contributing to impaired functioning. Learn and practice thought and behavioral control methods to minimize and control anxiety symptoms once they have begun. Identify specific thoughts that precipitate anxiety symptoms. Replace anxiety-producing thoughts with constructive thoughts. Verbalize an understanding of the general physical and cognitive manifestations of the causes  for anxiety. Identify and clarify the patterns of anxiety precipitants and consequences. Identify whether the symptoms of depression seem to be primarily related to interpersonal relationships, stressful life events or circumstances, thoughts/beliefs, or behaviors. Verbalize an understanding of the general physical and psychological effects of depression. Replace depression-promoting thoughts with mood-elevating thoughts. Keep a daily record of mood rating from 1 to 10, noting associated behaviors, activities, events, people, and thoughts. Identify specific thoughts that precipitate depressive moods. Monitor and report depressive symptoms, completing self-report assessment on a periodic basis. Describe and evaluate significant past and current interpersonal relationships. Tell memories of the deceased person, starting with positive memories. Consider possible ways of increasing involvement with others. Accept the reality of loss and tolerate the pain of intense grief. Engage in events and activities that increase level of social involvements. Identify and monitor specific pain triggers. Learn and implement somatic skills such as relaxation and/or biofeedback to reduce pain level. Learn mental coping skills and implement with somatic skills for managing acute pain. Interventions: Reinforce the client's belief system and/or refer to a spiritual leader who can support the client's faith. Explore and problem-solve with the client difficulties, fears, or barriers related to increasing social involvement. Point out to the client that engagement with others is associated with decreased feelings of loneliness, grief, depression, and hopelessness. Provide encouragement and support to the client for increased engagement in contact with others. Clarify with the client that expression of negative thoughts or feelings about the deceased is not disloyal. Assist the client in remembering the loved one in  realistic ways (i.e., includes attractions, positive traits, negative traits, conflicts, ambivalence, dependency, etc.), a memory that is neither idealized nor demonized, so that the client's relationship with the deceased person can be appraised realistically (or assign Dear _____: A Letter to a Lost Loved One in the Adult Psychotherapy Homework Planner, 2nd ed. by Jenniffer). Assign the client to  read about progressive muscle relaxation and other calming strategies in relevant books or treatment manuals (e.g., Progressive Relaxation Training by Thornell armin Collier). Assign a homework exercise in which the client implements somatic pain management skills and records the result; review and process during the treatment session. Teach the client distraction techniques (e.g., pleasant imagery, counting techniques, alternative focal points) and how to use them for relaxation skills for the management of acute episodes of pain. Ask the client to create a list of activities that are pleasurable to him/her (or assign Identify and Schedule Pleasant Activities in the Adult Psychotherapy Homework Planner, 2nd ed. by Jenniffer); process the list, developing a plan of increasing the frequency of engaging in the selected pleasurable activities. Teach the client problem-solving skills to apply to removal of obstacles to implementing new skills; role-play implementation of these skills to obstacles. Assist the client in identifying the most effective personal stress management techniques (e.g., prayer, walking, baking, telephoning a friend), and encourage daily scheduling of these activities (or assign Past Successful Anxiety Coping in the Adult Psychotherapy Homework Planner, 2nd ed. by Jenniffer). Teach the client the concept of finding a good match between the individual's capacities and the demands of the physical environment. If the physical environment is too demanding for the individual's capacities (e.g., frail  older adult taking care of a large house), the individual can become overwhelmed and may need to move from large home to smaller accommodations or residence for older adults). While reviewing the client's anxiety symptom chart, help him/her recognize patterns associated with anxiety symptoms: sort out precipitants from consequences, identify the most intense or frequent precipitants, and identify the consequences that help to perpetuate maladaptive patterns. Assist the client in identifying his/her current attempts to reducing anxiety (e.g., constantly telephoning family or physician, making unneeded doctor appointments or going to the emergency room), their apparent positive consequences (e.g., feeling better, getting attention), but longer-term negative consequences (e.g., physician won't return calls, friends avoid the client because of expressions of worry). Teach the client that his/her mood can be improved by increasing pleasant events and decreasing unpleasant events. Encourage the client to identify pleasant events that are desirable, but are not currently a part of his/her daily routine (see Identify and Schedule Pleasant Activities in the Adult Psychotherapy Homework Planner, 2nd ed. by Jenniffer). Develop a one-week daily schedule with the client that increases pleasant events and decreases unpleasant events, making sure to have at least one pleasant event every day. Help the client to understand his/her strengths and weaknesses in problem-solving, to view problems as challenges and not personal deficits, and to identify and define current problem(s) that will be the focus of treatment. Help the client to identify realistic goals to address problem(s) of concern and identify major obstacles in achieving goals. Encourage the client to implement chosen solutions to current life problem(s), self-monitor and evaluate solution implementation, and reward self for efforts. Explore the relationship of  negative self-talk (e.g., I'm just a burden to everyone; Everyone would be better off if I were dead) and distorted beliefs (e.g., There's no one like me living here; I'd rather never leave my room than have anyone see me in a wheelchair) to depressed mood (or assign Negative Thoughts Trigger Negative Feelings in the Adult Psychotherapy Homework Planner, 2nd ed. by Jenniffer; or Daily Record of Dysfunctional Thoughts in Cognitive Therapy of Depression by Almarie Candida Gentry and Shona). Encourage the client to distinguish between all-or-nothing thinking (e.g., Life in a nursing home can't be worth living)  and genuine nuanced reflection on life's meaning and purpose (e.g., What's giving my life meaning at this stage?); confront the former, encourage the latter.  Diagnosis:Major depressive disorder, recurrent episode, moderate (HCC)  Generalized anxiety disorder  Grief  Plan:  -focus on  for what you are grateful -meet again on Friday, August 15, 2024 at 10am inperson

## 2024-08-07 ENCOUNTER — Telehealth: Payer: Self-pay

## 2024-08-07 NOTE — Telephone Encounter (Signed)
 Copied from CRM [id number]. Topic: Clinical - Medical Advice >> Aug 06, 2024 12:59 PM Alfonso ORN wrote: Reason for CRM:  need to speak Zada Palin or a nurse  seen pain managment provider Berline need Zada Palin to do some medication changes for temazepam  (RESTORIL ) 22.5 MG capsule  the need to stop the medication  and can't be stop right away have to be tapered down due to a medication that Dr. Berline have patient on need to make some changes  and also need the order result from EMG test  Please contact patient    ----------------------------------------------------------------------- From previous Reason for Contact - Other: Reason for CRM:

## 2024-08-08 ENCOUNTER — Telehealth: Payer: Self-pay

## 2024-08-08 NOTE — Telephone Encounter (Signed)
 Me    08/08/24  8:32 AM Note Attempted call to Dr. Berline  [facility] [address] [phone] Called - no answer- possible entire office is closed on Friday's - I Left detailed voice mail message on confidential voice mail requested these records be sent to our office for review asap.

## 2024-08-08 NOTE — Telephone Encounter (Signed)
 Copied from CRM [id number]. Topic: Clinical - Medical Advice >> Aug 06, 2024 12:59 PM Alfonso ORN wrote: Reason for CRM:  need to speak Zada Palin or a nurse  seen pain managment provider Berline need Zada Palin to do some medication changes for temazepam  (RESTORIL ) 22.5 MG capsule  the need to stop the medication  and can't be stop right away have to be tapered down due to a medication that Dr. Berline have patient on need to make some changes  and also need the order result from EMG test  Please contact patient    ----------------------------------------------------------------------- From previous Reason for Contact - Other: Reason for CRM: >> Aug 07, 2024  3:37 PM Suzette B wrote: need to speak Zada Palin or a nurse  seen pain managment provider Berline need Zada Palin to do some medication changes for temazepam  (RESTORIL ) 22.5 MG capsule  the need to stop the medication  and can't be stop right away have to be tapered down due to a medication that Dr. Berline have patient on need to make some changes  and also need the order result from EMG test  Please contact patient

## 2024-08-08 NOTE — Telephone Encounter (Signed)
 Attempted call to Dr. Berline  [facility] [address] [phone] Called - no answer- possible entire office is closed on Friday's - I Left detailed voice mail message on confidential voice mail requested these records be sent to our office for review asap.

## 2024-08-11 ENCOUNTER — Inpatient Hospital Stay (HOSPITAL_BASED_OUTPATIENT_CLINIC_OR_DEPARTMENT_OTHER): Admitting: Medical Oncology

## 2024-08-11 ENCOUNTER — Inpatient Hospital Stay: Attending: Hematology & Oncology

## 2024-08-11 ENCOUNTER — Inpatient Hospital Stay

## 2024-08-11 ENCOUNTER — Encounter: Payer: Self-pay | Admitting: Medical Oncology

## 2024-08-11 VITALS — BP 138/81 | HR 62 | Temp 98.4°F | Resp 18 | Ht 64.0 in | Wt 200.0 lb

## 2024-08-11 DIAGNOSIS — K912 Postsurgical malabsorption, not elsewhere classified: Secondary | ICD-10-CM | POA: Insufficient documentation

## 2024-08-11 DIAGNOSIS — Z95828 Presence of other vascular implants and grafts: Secondary | ICD-10-CM

## 2024-08-11 DIAGNOSIS — Z9884 Bariatric surgery status: Secondary | ICD-10-CM | POA: Insufficient documentation

## 2024-08-11 DIAGNOSIS — D509 Iron deficiency anemia, unspecified: Secondary | ICD-10-CM

## 2024-08-11 LAB — CBC WITH DIFFERENTIAL (CANCER CENTER ONLY)
Abs Immature Granulocytes: 0.04 K/uL (ref 0.00–0.07)
Basophils Absolute: 0 K/uL (ref 0.0–0.1)
Basophils Relative: 1 %
Eosinophils Absolute: 0.2 K/uL (ref 0.0–0.5)
Eosinophils Relative: 3 %
HCT: 38.4 % (ref 36.0–46.0)
Hemoglobin: 12.5 g/dL (ref 12.0–15.0)
Immature Granulocytes: 1 %
Lymphocytes Relative: 22 %
Lymphs Abs: 1.2 K/uL (ref 0.7–4.0)
MCH: 29.6 pg (ref 26.0–34.0)
MCHC: 32.6 g/dL (ref 30.0–36.0)
MCV: 91 fL (ref 80.0–100.0)
Monocytes Absolute: 0.5 K/uL (ref 0.1–1.0)
Monocytes Relative: 9 %
Neutro Abs: 3.6 K/uL (ref 1.7–7.7)
Neutrophils Relative %: 64 %
Platelet Count: 267 K/uL (ref 150–400)
RBC: 4.22 MIL/uL (ref 3.87–5.11)
RDW: 13 % (ref 11.5–15.5)
WBC Count: 5.5 K/uL (ref 4.0–10.5)
nRBC: 0 % (ref 0.0–0.2)

## 2024-08-11 LAB — RETICULOCYTES
Immature Retic Fract: 7.9 % (ref 2.3–15.9)
RBC.: 4.22 MIL/uL (ref 3.87–5.11)
Retic Count, Absolute: 64.1 K/uL (ref 19.0–186.0)
Retic Ct Pct: 1.5 % (ref 0.4–3.1)

## 2024-08-11 LAB — IRON AND IRON BINDING CAPACITY (CC-WL,HP ONLY)
Iron: 63 ug/dL (ref 28–170)
Saturation Ratios: 15 % (ref 10.4–31.8)
TIBC: 435 ug/dL (ref 250–450)
UIBC: 372 ug/dL

## 2024-08-11 LAB — FERRITIN: Ferritin: 83 ng/mL (ref 11–307)

## 2024-08-11 NOTE — Progress Notes (Signed)
 Hematology and Oncology Follow Up Visit  Deeya Richeson [id number] 1951-01-23 73 y.o. 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 Kristensen is here today for follow-up.   Today she states that she has been stable.  She notes chronic fatigue.  No blood loss, abnormal bruising or petechiae noted.  No fever, chills, n/v, cough, rash, dizziness, SOB, chest pain, abdominal pain or changes in bowel or bladder habits.  No swelling in her extremities at this time.  Numbness and tingling in her lower extremities since her hip replacements in 2018 is unchanged from baseline.  No falls or syncope reported.  Appetite and hydration is good.  Wt Readings from Last 3 Encounters:  08/11/24 200 lb (90.7 kg)  07/29/24 201 lb (91.2 kg)  07/08/24 200 lb (90.7 kg)    ECOG Performance Status: 1 - Symptomatic but completely ambulatory  Medications:  Allergies as of 08/11/2024       Reactions   Compazine [prochlorperazine] Anaphylaxis   Phenothiazines Anaphylaxis, Swelling   Caused her throat to close   Prednisone Itching, Swelling, Rash   Facial swelling   Pregabalin Itching, Swelling   Swelling and itching of hands and feet.    Cefadroxil Itching        Medication List        Accurate as of August 11, 2024  2:32 PM. If you have any questions, ask your nurse or doctor.          STOP taking these medications    celecoxib  200 MG capsule Commonly known as: CELEBREX  Stopped by: Lauraine CHRISTELLA Dais   nystatin  100000 UNIT/ML suspension Commonly known as: MYCOSTATIN  Stopped by: Lauraine CHRISTELLA Dais       TAKE these medications    buPROPion  300 MG 24 hr tablet Commonly known as: WELLBUTRIN  XL Take 1 tablet (300 mg total) by mouth daily. Take in the evening   carbamazepine  100 MG 12 hr tablet Commonly known as: TEGretol -XR Take 1 tablet (100 mg total) by  mouth 2 (two) times daily.   clotrimazole  1 % cream Commonly known as: LOTRIMIN  Apply 1 Application topically 2 (two) times daily. Apply under breasts and between toes.   desvenlafaxine  100 MG 24 hr tablet Commonly known as: PRISTIQ  Take 1 tablet (100 mg total) by mouth daily.   FLUoxetine  10 MG capsule Commonly known as: PROZAC  Take 1 capsule (10 mg total) by mouth daily.   gabapentin  300 MG capsule Commonly known as: NEURONTIN  Take 300 mg by mouth 3 (three) times daily. What changed: Another medication with the same name was removed. Continue taking this medication, and follow the directions you see here. Changed by: Lauraine CHRISTELLA Dais   HYDROmorphone 2 MG tablet Commonly known as: DILAUDID Take 2 mg by mouth 2 (two) times daily as needed for severe pain (pain score 7-10).   lisinopril  20 MG tablet Commonly known as: ZESTRIL  Take 1 tablet (20 mg total) by mouth daily.   Magnesium 250 MG Tabs Take 250 mg by mouth at bedtime.   nystatin  powder Commonly known as: MYCOSTATIN /NYSTOP  APPLY TO THE AFFECTED AREA(S) EXTERNALLY TWICE DAILY What changed: See the new instructions.   propranolol  ER 60 MG 24 hr capsule Commonly known as: Inderal  LA Take 1 capsule (60 mg total) by mouth daily.   solifenacin  10 MG tablet Commonly known as: VESICARE  Take 1 tablet (  10 mg total) by mouth daily.   temazepam  22.5 MG capsule Commonly known as: RESTORIL  TAKE ONE CAPSULE BY MOUTH AT NIGHT   tiZANidine  2 MG tablet Commonly known as: ZANAFLEX  TAKE 1 TABLET (2 MG TOTAL) BY MOUTH IN THE MORNING AND AT BEDTIME.   traMADol  50 MG tablet Commonly known as: ULTRAM  Take 50 mg by mouth every 6 (six) hours as needed for moderate pain (pain score 4-6).        Allergies:  Allergies  Allergen Reactions   Compazine [Prochlorperazine] Anaphylaxis   Phenothiazines Anaphylaxis and Swelling    Caused her throat to close     Prednisone Itching, Swelling and Rash    Facial swelling    Pregabalin Itching and Swelling    Swelling and itching of hands and feet.      Cefadroxil Itching    Past Medical History, Surgical history, Social history, and Family History were reviewed and updated.  Review of Systems: All other 10 point review of systems is negative.   Physical Exam:  height is 5' 4 (1.626 m) and weight is 200 lb (90.7 kg). Her oral temperature is 98.4 F (36.9 C). Her blood pressure is 138/81 and her pulse is 62. Her respiration is 18 and oxygen saturation is 100%.   Wt Readings from Last 3 Encounters:  08/11/24 200 lb (90.7 kg)  07/29/24 201 lb (91.2 kg)  07/08/24 200 lb (90.7 kg)    Ocular: Sclerae unicteric, pupils equal, round and reactive to light Ear-nose-throat: Oropharynx clear, dentition fair Lymphatic: No cervical or supraclavicular adenopathy Lungs no rales or rhonchi, good excursion bilaterally Heart regular rate and rhythm, no murmur appreciated Abd soft, nontender, positive bowel sounds MSK no focal spinal tenderness, no joint edema Neuro: non-focal, well-oriented, appropriate affect  Lab Results  Component Value Date   WBC 5.5 08/11/2024   HGB 12.5 08/11/2024   HCT 38.4 08/11/2024   MCV 91.0 08/11/2024   PLT 267 08/11/2024   Lab Results  Component Value Date   FERRITIN 93 01/07/2024   IRON 58 01/07/2024   TIBC 417 01/07/2024   UIBC 359 01/07/2024   IRONPCTSAT 14 01/07/2024   Lab Results  Component Value Date   RETICCTPCT 1.5 08/11/2024   RBC 4.22 08/11/2024   No results found for: KPAFRELGTCHN, LAMBDASER, KAPLAMBRATIO No results found for: IGGSERUM, IGA, IGMSERUM No results found for: STEPHANY CARLOTA BENSON MARKEL EARLA JOANNIE DOC VICK, SPEI   Chemistry      Component Value Date/Time   NA 136 08/14/2023 1357   K 4.8 08/14/2023 1357   CL 95 (L) 08/14/2023 1357   CO2 26 08/14/2023 1357   BUN 17 08/14/2023 1357   CREATININE 0.68 08/14/2023 1357   CREATININE 0.80 02/20/2023  1128      Component Value Date/Time   CALCIUM 9.6 08/14/2023 1357   ALKPHOS 107 08/14/2023 1357   AST 30 08/14/2023 1357   AST 30 09/07/2021 1433   ALT 29 08/14/2023 1357   ALT 31 09/07/2021 1433   BILITOT 0.4 08/14/2023 1357   BILITOT 0.3 09/07/2021 1433      Encounter Diagnoses  Name Primary?   Iron deficiency anemia, unspecified iron deficiency anemia type Yes   Port-A-Cath in place     Impression and Plan: Ms. 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 She has a port-a-cath in place.   Today CBC shows a normal Hgb of  12.5 with normal MCV level Iron studies pending. We will replace if needed.   RTC every 2 months for port flush RTC 6 months Franchot, labs (CBC, iron, ferritin, retic)  Lauraine CHRISTELLA Dais, PA-C 9/15/20252:32 PM

## 2024-08-11 NOTE — Patient Instructions (Signed)

## 2024-08-12 ENCOUNTER — Ambulatory Visit: Payer: Self-pay | Admitting: Medical Oncology

## 2024-08-12 NOTE — Telephone Encounter (Signed)
 Notes received and placed in Zada Palin, NP basket for review.

## 2024-08-12 NOTE — Telephone Encounter (Signed)
 Records received and placed in Zada Palin, NP basket for review

## 2024-08-14 ENCOUNTER — Telehealth: Payer: Self-pay

## 2024-08-14 ENCOUNTER — Encounter: Payer: Self-pay | Admitting: Medical-Surgical

## 2024-08-14 ENCOUNTER — Ambulatory Visit (INDEPENDENT_AMBULATORY_CARE_PROVIDER_SITE_OTHER): Admitting: Medical-Surgical

## 2024-08-14 VITALS — BP 150/78 | HR 59 | Resp 20 | Ht 64.0 in | Wt 200.0 lb

## 2024-08-14 DIAGNOSIS — L603 Nail dystrophy: Secondary | ICD-10-CM | POA: Insufficient documentation

## 2024-08-14 DIAGNOSIS — G894 Chronic pain syndrome: Secondary | ICD-10-CM | POA: Diagnosis not present

## 2024-08-14 DIAGNOSIS — M4302 Spondylolysis, cervical region: Secondary | ICD-10-CM

## 2024-08-14 DIAGNOSIS — F5104 Psychophysiologic insomnia: Secondary | ICD-10-CM

## 2024-08-14 DIAGNOSIS — F419 Anxiety disorder, unspecified: Secondary | ICD-10-CM

## 2024-08-14 MED ORDER — DAYVIGO 10 MG PO TABS: 10.0000 mg | ORAL_TABLET | Freq: Every evening | ORAL | 1 refills | Status: DC

## 2024-08-14 MED ORDER — CARBAMAZEPINE ER 200 MG PO TB12: 200.0000 mg | ORAL_TABLET | Freq: Two times a day (BID) | ORAL | 0 refills | Status: DC

## 2024-08-14 MED ORDER — TEMAZEPAM 7.5 MG PO CAPS: ORAL_CAPSULE | ORAL | 0 refills | Status: DC

## 2024-08-14 NOTE — Assessment & Plan Note (Signed)
 Chronic insomnia with difficulty maintaining sleep. Temazepam  used for years, effective for onset but not maintenance.  Unfortunately, pain management efforts required discontinuation of temazepam .  Alternatives discussed  - Taper temazepam  to 15 mg nightly for 10 days, then reduce to 7.5 mg for 10 days, then stop. - Prescribe Dayvigo  10 mg nightly. - Consider Belsomra, Quviviq, or Rozerem if Dayvigo  is ineffective or unaffordable. - Consider adding hydroxyzine  if needed for sleep onset.

## 2024-08-14 NOTE — Telephone Encounter (Signed)
 Copied from CRM [id number]. Topic: Clinical - Prescription Issue >> Aug 14, 2024 12:05 PM Diannia H wrote: Reason for CRM: Norlene is calling from [facility] and she was trying to fill the rx for temazepam  (RESTORIL ) 22.5 MG, and it was sent over for 7.5 mg capsules and they can't get those but they do have 15 mg capsules and she is wanting to know how the provider wants this filled. Could you assist? Callback number [phone].

## 2024-08-14 NOTE — Assessment & Plan Note (Signed)
 Chronic pain impacts daily activities. Current management includes hydromorphone and carbamazepine . Opioid tolerance and receptor upregulation discussed. - Increase carbamazepine  to 200 mg twice daily. - Continue regular follow-ups with Dr. Berline.

## 2024-08-14 NOTE — Assessment & Plan Note (Signed)
 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 Opioid tolerance and receptor upregulation discussed. - Managed by Dr. Berline with pain management. - Reviewed most recent note however unable to find information on the referral that was made by Dr. Garvin for a different neurosurgeon. - Per patient request, faxing EMG/nerve conduction results to Dr. Berline.

## 2024-08-14 NOTE — Progress Notes (Signed)
 Established patient visit   History of Present Illness   Discussed the use of AI scribe software for clinical note transcription with the patient, who gave verbal consent to proceed.  History of Present Illness   Heather Thompson is a 73 year old female who presents for management of chronic neck pain and insomnia.  Cervicalgia with radiculopathy - Chronic neck pain localized at the C3 and C4 vertebrae - Pain radiates to the occipital region - Pain alternates sides and is severe - Movement is restricted, particularly in lowering the hand - Hydromorphone currently used but ineffective - Abnormal EMG nerve conduction study previously documented - Pain has necessitated two emergency department visits - Daily activities are greatly limited - Started Tegretol  100mg  BID but not helping  Insomnia - Persistent insomnia despite multiple treatments - Ambien caused nightmares and Lunesta caused sleepwalking - Temazepam  used for many years but has to be discontinued due to increasing pain medication  Anxiety - Propranolol  extended release has notably reduced anxiety without causing fatigue - Continue management with psychiatry  Onychodystrophy - Reports toenails are thickened requiring regular care - Due to chronic musculoskeletal issues, has extreme difficulty trying to get down to her feet to do her own footcare independently - Interested in seeing podiatry    Physical Exam   Physical Exam Vitals reviewed.  Constitutional:      General: She is not in acute distress.    Appearance: Normal appearance. She is not ill-appearing.  HENT:     Head: Normocephalic and atraumatic.  Cardiovascular:     Rate and Rhythm: Normal rate and regular rhythm.  Pulmonary:     Effort: Pulmonary effort is normal. No respiratory distress.  Skin:    General: Skin is warm and dry.  Neurological:     Mental Status: She is alert and oriented to person, place, and time.  Psychiatric:        Mood  and Affect: Mood normal.        Behavior: Behavior normal.        Thought Content: Thought content normal.        Judgment: Judgment normal.    Assessment & Plan   Problem List Items Addressed This Visit       Musculoskeletal and Integument   Onychodystrophy   Thickened toenails possibly related to previous cellulitis, self-care difficult due to limited mobility. - Refer to podiatry for toenail care.       Relevant Orders   Ambulatory referral to Podiatry   Spondylolysis of cervical spine   Severe neck pain with radiation to the head, hydromorphone ineffective, surgical candidacy uncertain. Opioid tolerance and receptor upregulation discussed. - Managed by Dr. Berline with pain management. - Reviewed most recent note however unable to find information on the referral that was made by Dr. Garvin for a different neurosurgeon. - Per patient request, faxing EMG/nerve conduction results to Dr. Berline.        Other   Anxiety - Primary   Anxiety managed with several medications per psychiatry.  Propranolol  switched from instant release to extended release 2 weeks ago, effective without sedation. - Continue propranolol  extended release as prescribed.      Chronic pain syndrome   Chronic pain impacts daily activities. Current management includes hydromorphone and carbamazepine . Opioid tolerance and receptor upregulation discussed. - Increase carbamazepine  to 200 mg twice daily. - Continue regular follow-ups with Dr. Berline.      Relevant Medications   carbamazepine  (TEGRETOL -XR)  200 MG 12 hr tablet   Insomnia   Chronic insomnia with difficulty maintaining sleep. Temazepam  used for years, effective for onset but not maintenance.  Unfortunately, pain management efforts required discontinuation of temazepam .  Alternatives discussed  - Taper temazepam  to 15 mg nightly for 10 days, then reduce to 7.5 mg for 10 days, then stop. - Prescribe Dayvigo  10 mg nightly. - Consider Belsomra,  Quviviq, or Rozerem if Dayvigo  is ineffective or unaffordable. - Consider adding hydroxyzine  if needed for sleep onset.      Follow up   Return in about 2 weeks (around 08/28/2024) for chronic disease follow up. __________________________________ Zada FREDRIK Palin, DNP, APRN, FNP-BC Primary Care and Sports Medicine [facility] [place]

## 2024-08-14 NOTE — Assessment & Plan Note (Signed)
 Anxiety managed with several medications per psychiatry.  Propranolol  switched from instant release to extended release 2 weeks ago, effective without sedation. - Continue propranolol  extended release as prescribed.

## 2024-08-14 NOTE — Assessment & Plan Note (Signed)
 Thickened toenails possibly related to previous cellulitis, self-care difficult due to limited mobility. - Refer to podiatry for toenail care.

## 2024-08-15 ENCOUNTER — Encounter: Payer: Self-pay | Admitting: Professional

## 2024-08-15 ENCOUNTER — Other Ambulatory Visit: Payer: Self-pay

## 2024-08-15 ENCOUNTER — Ambulatory Visit: Admitting: Professional

## 2024-08-15 DIAGNOSIS — F411 Generalized anxiety disorder: Secondary | ICD-10-CM

## 2024-08-15 DIAGNOSIS — F4321 Adjustment disorder with depressed mood: Secondary | ICD-10-CM | POA: Diagnosis not present

## 2024-08-15 DIAGNOSIS — F331 Major depressive disorder, recurrent, moderate: Secondary | ICD-10-CM | POA: Diagnosis not present

## 2024-08-15 MED ORDER — TEMAZEPAM 7.5 MG PO CAPS: ORAL_CAPSULE | ORAL | 0 refills | Status: DC

## 2024-08-15 NOTE — Telephone Encounter (Signed)
 Spoke with [facility] pharmacy -  Because the Restoril  is a controlled medication - they are needing the prescription sent to the pharmacy for the 15mg   10 capsules - she was not allowed to take a verbal.   Also, they can not get the 7.5mg  at all but suggested that we call this to a [facility] pharmacy ans she feels they would be able to get this strength.   Spoke with patient - explained the plan  She is requesting the 7.5mg  go to [facility] [address] [place]. Added pharmacy to chart and pended 7.5mg  to be resent to [facility]

## 2024-08-15 NOTE — Progress Notes (Signed)
 [facility] Counselor/Therapist Progress Note  Patient ID: Heather Thompson, MRN: [id number],    Date: 08/15/2024  Time Spent:  49 minutes [id number]  Treatment Type: Individual Therapy  Risk Assessment: Danger to Self:  No Self-injurious Behavior: No Danger to Others: No  Subjective: This session was held via video teletherapy. The patient consented to video teletherapy and was located in her home during this session. She is aware it is the responsibility of the patient to secure confidentiality on her end of the session. The provider was in a private home office for the duration of this session.    The patient arrived late for her Caregility appointment.  Issues addressed: 1-physical a-struggling with severe neck pain -pain medicine MD recommends a surgeon second opinion on options for surgical intervention -pt awaiting appointment b-eating behavior out of control -pt shared her eating behaviors and concern for weight gain -pt admits that it is emotional eating and she is struggling to change the behavior -pt sees food as her companion when watching TV after dinner -she makes poor choices when ordering her groceries -discussed strategies to help her change her behavior  Treatment Plan Problems: Financial Stress, Low Self-Esteem, Attention Deficit Disorder (ADD) - Adult Symptoms: Unable to concentrate or pay attention to things of low interest, even when those things are important to his/her life. Restless and fidgety; unable to be sedentary for more than a short time. Disorganized in most areas of his/her life. Starts many projects but rarely finishes any. Chronic low self-esteem. Tendency toward addictive behaviors. Indebtedness and overdue bills that exceed ability to meet monthly payments. A long-term lack of discipline in money management that has led to excessive indebtedness. A pattern of impulsive spending that does not consider the eventual financial  consequences. Inability to accept compliments. Makes self-disparaging remarks; sees self as unattractive, worthless, a loser, a burden, unimportant; takes blame easily. Difficulty in saying no to others; assumes not being liked by others. Fear of rejection by others, especially peer group. Lack of any goals for life and setting of inappropriately low goals for self. Inability to identify positive characteristics of self. Anxious and uncomfortable in social situations. Goals: Reduce impulsive actions while increasing concentration and focus on low-interest activities. Minimize ADD behavioral interference in daily life. Sustain attention and concentration for consistently longer periods of time. Achieve an inner strength to control personal impulses, cravings, and desires that directly or indirectly increase debt irresponsibly. Resolve financial crisis with a path to eliminate debt. Revise spending patterns to not exceed income. Gain a new sense of self-worth in which the substance of one's value is not attached to the capacity to do things or own things that cost money. Understand personal desires, insecurities, and anxieties that make overspending possible. Elevate self-esteem. Develop a consistent, positive self-image. Demonstrate improved self-esteem through more pride in appearance, more assertiveness, greater eye contact, and identification of positive traits in self-talk messages. Establish an inward sense of self-worth, confidence, and competence. Interact socially without undue distress or disability. Objectives target date for all objectives is 03/25/2025: Identify the current specific ADD behaviors that cause the most difficulty. List the negative consequences of the ADD problematic behavior. Learn and implement organization and planning skills. Acknowledge procrastination and the need to reduce it. Learn and implement skills to reduce procrastination. Learn and implement skills  to reduce the disruptive influence of distractibility. Combine skills learned in therapy into a new daily approach to managing ADHD. Use cognitive and behavioral strategies to control the impulse to  make unnecessary and unaffordable purchases. Report instances of successful control over impulse to spend on unnecessary expenses. Increase insight into the historical and current sources of low self-esteem. Decrease the frequency of negative self-descriptive statements and increase frequency of positive self-descriptive statements. Articulate a plan to be proactive in trying to get identified needs met. Form realistic, appropriate, and attainable goals for self in all areas of life. Take verbal responsibility for accomplishments without discounting. Identify and replace negative self-talk messages used to reinforce low self-esteem. Objectives target date for all objectives is 03/13/2025: Identify the specific anxiety symptoms that are personally most disturbing or most contributing to impaired functioning. Learn and practice thought and behavioral control methods to minimize and control anxiety symptoms once they have begun. Identify specific thoughts that precipitate anxiety symptoms. Replace anxiety-producing thoughts with constructive thoughts. Verbalize an understanding of the general physical and cognitive manifestations of the causes for anxiety. Identify and clarify the patterns of anxiety precipitants and consequences. Identify whether the symptoms of depression seem to be primarily related to interpersonal relationships, stressful life events or circumstances, thoughts/beliefs, or behaviors. Verbalize an understanding of the general physical and psychological effects of depression. Replace depression-promoting thoughts with mood-elevating thoughts. Keep a daily record of mood rating from 1 to 10, noting associated behaviors, activities, events, people, and thoughts. Identify specific  thoughts that precipitate depressive moods. Monitor and report depressive symptoms, completing self-report assessment on a periodic basis. Describe and evaluate significant past and current interpersonal relationships. Tell memories of the deceased person, starting with positive memories. Consider possible ways of increasing involvement with others. Accept the reality of loss and tolerate the pain of intense grief. Engage in events and activities that increase level of social involvements. Identify and monitor specific pain triggers. Learn and implement somatic skills such as relaxation and/or biofeedback to reduce pain level. Learn mental coping skills and implement with somatic skills for managing acute pain. Interventions: Reinforce the client's belief system and/or refer to a spiritual leader who can support the client's faith. Explore and problem-solve with the client difficulties, fears, or barriers related to increasing social involvement. Point out to the client that engagement with others is associated with decreased feelings of loneliness, grief, depression, and hopelessness. Provide encouragement and support to the client for increased engagement in contact with others. Clarify with the client that expression of negative thoughts or feelings about the deceased is not disloyal. Assist the client in remembering the loved one in realistic ways (i.e., includes attractions, positive traits, negative traits, conflicts, ambivalence, dependency, etc.), a memory that is neither idealized nor demonized, so that the client's relationship with the deceased person can be appraised realistically (or assign Dear _____: A Letter to a Lost Loved One in the Adult Psychotherapy Homework Planner, 2nd ed. by Jenniffer). Assign the client to read about progressive muscle relaxation and other calming strategies in relevant books or treatment manuals (e.g., Progressive Relaxation Training by Thornell armin Collier). Assign a homework exercise in which the client implements somatic pain management skills and records the result; review and process during the treatment session. Teach the client distraction techniques (e.g., pleasant imagery, counting techniques, alternative focal points) and how to use them for relaxation skills for the management of acute episodes of pain. Ask the client to create a list of activities that are pleasurable to him/her (or assign Identify and Schedule Pleasant Activities in the Adult Psychotherapy Homework Planner, 2nd ed. by Jenniffer); process the list, developing a plan of increasing the frequency of engaging  in the selected pleasurable activities. Teach the client problem-solving skills to apply to removal of obstacles to implementing new skills; role-play implementation of these skills to obstacles. Assist the client in identifying the most effective personal stress management techniques (e.g., prayer, walking, baking, telephoning a friend), and encourage daily scheduling of these activities (or assign Past Successful Anxiety Coping in the Adult Psychotherapy Homework Planner, 2nd ed. by Jenniffer). Teach the client the concept of finding a good match between the individual's capacities and the demands of the physical environment. If the physical environment is too demanding for the individual's capacities (e.g., frail older adult taking care of a large house), the individual can become overwhelmed and may need to move from large home to smaller accommodations or residence for older adults). While reviewing the client's anxiety symptom chart, help him/her recognize patterns associated with anxiety symptoms: sort out precipitants from consequences, identify the most intense or frequent precipitants, and identify the consequences that help to perpetuate maladaptive patterns. Assist the client in identifying his/her current attempts to reducing anxiety (e.g., constantly  telephoning family or physician, making unneeded doctor appointments or going to the emergency room), their apparent positive consequences (e.g., feeling better, getting attention), but longer-term negative consequences (e.g., physician won't return calls, friends avoid the client because of expressions of worry). Teach the client that his/her mood can be improved by increasing pleasant events and decreasing unpleasant events. Encourage the client to identify pleasant events that are desirable, but are not currently a part of his/her daily routine (see Identify and Schedule Pleasant Activities in the Adult Psychotherapy Homework Planner, 2nd ed. by Jenniffer). Develop a one-week daily schedule with the client that increases pleasant events and decreases unpleasant events, making sure to have at least one pleasant event every day. Help the client to understand his/her strengths and weaknesses in problem-solving, to view problems as challenges and not personal deficits, and to identify and define current problem(s) that will be the focus of treatment. Help the client to identify realistic goals to address problem(s) of concern and identify major obstacles in achieving goals. Encourage the client to implement chosen solutions to current life problem(s), self-monitor and evaluate solution implementation, and reward self for efforts. Explore the relationship of negative self-talk (e.g., I'm just a burden to everyone; Everyone would be better off if I were dead) and distorted beliefs (e.g., There's no one like me living here; I'd rather never leave my room than have anyone see me in a wheelchair) to depressed mood (or assign Negative Thoughts Trigger Negative Feelings in the Adult Psychotherapy Homework Planner, 2nd ed. by Jenniffer; or Daily Record of Dysfunctional Thoughts in Cognitive Therapy of Depression by Almarie Candida Gentry and Shona). Encourage the client to distinguish between all-or-nothing  thinking (e.g., Life in a nursing home can't be worth living) and genuine nuanced reflection on life's meaning and purpose (e.g., What's giving my life meaning at this stage?); confront the former, encourage the latter.  Diagnosis:Major depressive disorder, recurrent episode, moderate (HCC)  Generalized anxiety disorder  Grief  Plan:  -focus on  for what you are grateful -meet again on Thursday, August 21, 2024 at [facility]

## 2024-08-15 NOTE — Telephone Encounter (Signed)
 Me    08/15/24  9:55 AM Note Spoke with [facility] -  Because the Restoril  is a controlled medication - they are needing the prescription sent to the pharmacy for the 15mg   10 capsules - she was not allowed to take a verbal.    Also, they can not get the 7.5mg  at all but suggested that we [facility] this to a [facility] pharmacy ans she feels they would be able to get this strength.    Spoke with patient - explained the plan  She is requesting the 7.5mg  go to [facility] [address] [place]. Added pharmacy to chart and pended 7.5mg  to be resent to [facility]

## 2024-08-18 ENCOUNTER — Other Ambulatory Visit: Payer: Self-pay | Admitting: Medical-Surgical

## 2024-08-20 NOTE — Telephone Encounter (Unsigned)
 Copied from CRM #[id number]. Topic: Clinical - Medical Advice >> Aug 20, 2024  2:10 PM Darshell M wrote: Reason for CRM: Taking temazepam .  Provider advised her to taper down on the temazepam  before starting a new medication. Pharmacist filled new prescription for Dayvigo  and told her to stop taking temazepam  and start taking Dayvigo . Patient wants to clarify what she should do.

## 2024-08-21 ENCOUNTER — Encounter: Payer: Self-pay | Admitting: Professional

## 2024-08-21 ENCOUNTER — Ambulatory Visit: Admitting: Professional

## 2024-08-21 DIAGNOSIS — F411 Generalized anxiety disorder: Secondary | ICD-10-CM

## 2024-08-21 DIAGNOSIS — F4321 Adjustment disorder with depressed mood: Secondary | ICD-10-CM | POA: Diagnosis not present

## 2024-08-21 DIAGNOSIS — F331 Major depressive disorder, recurrent, moderate: Secondary | ICD-10-CM | POA: Diagnosis not present

## 2024-08-21 NOTE — Progress Notes (Signed)
  Behavioral Health Counselor/Therapist Progress Note  Patient ID: Heather Thompson, MRN: [id number],    Date: 08/21/2024  Time Spent:  49 minutes 956-1045am  Treatment Type: Individual Therapy  Risk Assessment: Danger to Self:  No Self-injurious Behavior: No Danger to Others: No  Subjective: The patient arrived early for her in person appointment.  Issues addressed: 1-physical a-neck is awful -she has not had a call back regarding her next appointment b-eating behavior is awful -she stated she can't do anything about it -discussed can't vs won't -she admits that she has never considered -she buys the un/healthy food choice 2-coping -educated on strategies for managing emotional eating -discussed importance of meal planning, shopping from her grocery list -importance of only eating at the table and not in front of the TV -discussed difference in eating a meal out with friends and watching TV  Treatment Plan Problems: Financial Stress, Low Self-Esteem, Attention Deficit Disorder (ADD) - Adult Symptoms: Unable to concentrate or pay attention to things of low interest, even when those things are important to his/her life. Restless and fidgety; unable to be sedentary for more than a short time. Disorganized in most areas of his/her life. Starts many projects but rarely finishes any. Chronic low self-esteem. Tendency toward addictive behaviors. Indebtedness and overdue bills that exceed ability to meet monthly payments. A long-term lack of discipline in money management that has led to excessive indebtedness. A pattern of impulsive spending that does not consider the eventual financial consequences. Inability to accept compliments. Makes self-disparaging remarks; sees self as unattractive, worthless, a loser, a burden, unimportant; takes blame easily. Difficulty in saying no to others; assumes not being liked by others. Fear of rejection by others, especially peer  group. Lack of any goals for life and setting of inappropriately low goals for self. Inability to identify positive characteristics of self. Anxious and uncomfortable in social situations. Goals: Reduce impulsive actions while increasing concentration and focus on low-interest activities. Minimize ADD behavioral interference in daily life. Sustain attention and concentration for consistently longer periods of time. Achieve an inner strength to control personal impulses, cravings, and desires that directly or indirectly increase debt irresponsibly. Resolve financial crisis with a path to eliminate debt. Revise spending patterns to not exceed income. Gain a new sense of self-worth in which the substance of one's value is not attached to the capacity to do things or own things that cost money. Understand personal desires, insecurities, and anxieties that make overspending possible. Elevate self-esteem. Develop a consistent, positive self-image. Demonstrate improved self-esteem through more pride in appearance, more assertiveness, greater eye contact, and identification of positive traits in self-talk messages. Establish an inward sense of self-worth, confidence, and competence. Interact socially without undue distress or disability. Objectives target date for all objectives is 03/25/2025: Identify the current specific ADD behaviors that cause the most difficulty. List the negative consequences of the ADD problematic behavior. Learn and implement organization and planning skills. Acknowledge procrastination and the need to reduce it. Learn and implement skills to reduce procrastination. Learn and implement skills to reduce the disruptive influence of distractibility. Combine skills learned in therapy into a new daily approach to managing ADHD. Use cognitive and behavioral strategies to control the impulse to make unnecessary and unaffordable purchases. Report instances of successful control  over impulse to spend on unnecessary expenses. Increase insight into the historical and current sources of low self-esteem. Decrease the frequency of negative self-descriptive statements and increase frequency of positive self-descriptive statements. Articulate a plan to be proactive  in trying to get identified needs met. Form realistic, appropriate, and attainable goals for self in all areas of life. Take verbal responsibility for accomplishments without discounting. Identify and replace negative self-talk messages used to reinforce low self-esteem. Objectives target date for all objectives is 03/13/2025: Identify the specific anxiety symptoms that are personally most disturbing or most contributing to impaired functioning. Learn and practice thought and behavioral control methods to minimize and control anxiety symptoms once they have begun. Identify specific thoughts that precipitate anxiety symptoms. Replace anxiety-producing thoughts with constructive thoughts. Verbalize an understanding of the general physical and cognitive manifestations of the causes for anxiety. Identify and clarify the patterns of anxiety precipitants and consequences. Identify whether the symptoms of depression seem to be primarily related to interpersonal relationships, stressful life events or circumstances, thoughts/beliefs, or behaviors. Verbalize an understanding of the general physical and psychological effects of depression. Replace depression-promoting thoughts with mood-elevating thoughts. Keep a daily record of mood rating from 1 to 10, noting associated behaviors, activities, events, people, and thoughts. Identify specific thoughts that precipitate depressive moods. Monitor and report depressive symptoms, completing self-report assessment on a periodic basis. Describe and evaluate significant past and current interpersonal relationships. Tell memories of the deceased person, starting with positive  memories. Consider possible ways of increasing involvement with others. Accept the reality of loss and tolerate the pain of intense grief. Engage in events and activities that increase level of social involvements. Identify and monitor specific pain triggers. Learn and implement somatic skills such as relaxation and/or biofeedback to reduce pain level. Learn mental coping skills and implement with somatic skills for managing acute pain. Interventions: Reinforce the client's belief system and/or refer to a spiritual leader who can support the client's faith. Explore and problem-solve with the client difficulties, fears, or barriers related to increasing social involvement. Point out to the client that engagement with others is associated with decreased feelings of loneliness, grief, depression, and hopelessness. Provide encouragement and support to the client for increased engagement in contact with others. Clarify with the client that expression of negative thoughts or feelings about the deceased is not disloyal. Assist the client in remembering the loved one in realistic ways (i.e., includes attractions, positive traits, negative traits, conflicts, ambivalence, dependency, etc.), a memory that is neither idealized nor demonized, so that the client's relationship with the deceased person can be appraised realistically (or assign Dear _____: A Letter to a Lost Loved One in the Adult Psychotherapy Homework Planner, 2nd ed. by Jenniffer). Assign the client to read about progressive muscle relaxation and other calming strategies in relevant books or treatment manuals (e.g., Progressive Relaxation Training by Thornell armin Collier). Assign a homework exercise in which the client implements somatic pain management skills and records the result; review and process during the treatment session. Teach the client distraction techniques (e.g., pleasant imagery, counting techniques, alternative focal points)  and how to use them for relaxation skills for the management of acute episodes of pain. Ask the client to create a list of activities that are pleasurable to him/her (or assign Identify and Schedule Pleasant Activities in the Adult Psychotherapy Homework Planner, 2nd ed. by Jenniffer); process the list, developing a plan of increasing the frequency of engaging in the selected pleasurable activities. Teach the client problem-solving skills to apply to removal of obstacles to implementing new skills; role-play implementation of these skills to obstacles. Assist the client in identifying the most effective personal stress management techniques (e.g., prayer, walking, baking, telephoning a friend), and encourage  daily scheduling of these activities (or assign Past Successful Anxiety Coping in the Adult Psychotherapy Homework Planner, 2nd ed. by Jenniffer). Teach the client the concept of finding a good match between the individual's capacities and the demands of the physical environment. If the physical environment is too demanding for the individual's capacities (e.g., frail older adult taking care of a large house), the individual can become overwhelmed and may need to move from large home to smaller accommodations or residence for older adults). While reviewing the client's anxiety symptom chart, help him/her recognize patterns associated with anxiety symptoms: sort out precipitants from consequences, identify the most intense or frequent precipitants, and identify the consequences that help to perpetuate maladaptive patterns. Assist the client in identifying his/her current attempts to reducing anxiety (e.g., constantly telephoning family or physician, making unneeded doctor appointments or going to the emergency room), their apparent positive consequences (e.g., feeling better, getting attention), but longer-term negative consequences (e.g., physician won't return calls, friends avoid the client because of  expressions of worry). Teach the client that his/her mood can be improved by increasing pleasant events and decreasing unpleasant events. Encourage the client to identify pleasant events that are desirable, but are not currently a part of his/her daily routine (see Identify and Schedule Pleasant Activities in the Adult Psychotherapy Homework Planner, 2nd ed. by Jenniffer). Develop a one-week daily schedule with the client that increases pleasant events and decreases unpleasant events, making sure to have at least one pleasant event every day. Help the client to understand his/her strengths and weaknesses in problem-solving, to view problems as challenges and not personal deficits, and to identify and define current problem(s) that will be the focus of treatment. Help the client to identify realistic goals to address problem(s) of concern and identify major obstacles in achieving goals. Encourage the client to implement chosen solutions to current life problem(s), self-monitor and evaluate solution implementation, and reward self for efforts. Explore the relationship of negative self-talk (e.g., I'm just a burden to everyone; Everyone would be better off if I were dead) and distorted beliefs (e.g., There's no one like me living here; I'd rather never leave my room than have anyone see me in a wheelchair) to depressed mood (or assign Negative Thoughts Trigger Negative Feelings in the Adult Psychotherapy Homework Planner, 2nd ed. by Jenniffer; or Daily Record of Dysfunctional Thoughts in Cognitive Therapy of Depression by Almarie Candida Gentry and Shona). Encourage the client to distinguish between all-or-nothing thinking (e.g., Life in a nursing home can't be worth living) and genuine nuanced reflection on life's meaning and purpose (e.g., What's giving my life meaning at this stage?); confront the former, encourage the latter.  Diagnosis:Major depressive disorder, recurrent episode, moderate  (HCC)  Generalized anxiety disorder  Grief  Plan:  -meet again on Tuesday, September 02, 2024 at [facility]

## 2024-08-22 ENCOUNTER — Telehealth: Payer: Self-pay

## 2024-08-22 NOTE — Telephone Encounter (Signed)
 Called [facility] regarding the 7.5mg   prescription of temazepam   [facility] was to be filling the 15mg   Check on these

## 2024-08-22 NOTE — Telephone Encounter (Signed)
 Left detailed voicemail message on patient home #  That temazepam  7.5mg  was at [facility] [address] for pick up  And left detailed directions on how to take medication on this voice mail as well.

## 2024-08-22 NOTE — Telephone Encounter (Signed)
 Copied from CRM [id number]. Topic: General - Other >> Aug 22, 2024  3:51 PM Fonda T wrote: Reason for CRM: Received call from patient, states she is returning call to [place] at office.  Called and spoke to office, Luke unavailable at time of call.  Patient requesting a return call, can be reached at [phone].

## 2024-08-22 NOTE — Telephone Encounter (Signed)
 Patient returning call to Mrs. Luke, please call patient as soon as you cam, thanks.

## 2024-08-25 ENCOUNTER — Other Ambulatory Visit (HOSPITAL_COMMUNITY): Payer: Self-pay

## 2024-08-25 ENCOUNTER — Encounter: Payer: Self-pay | Admitting: Family

## 2024-08-25 ENCOUNTER — Ambulatory Visit: Admitting: Professional

## 2024-08-26 NOTE — Telephone Encounter (Signed)
 Called patient and left her a detailed voice mail message that prescription for Temazepam  7.5mg  has been approved.

## 2024-08-27 ENCOUNTER — Other Ambulatory Visit (HOSPITAL_COMMUNITY): Payer: Self-pay

## 2024-08-27 NOTE — Telephone Encounter (Signed)
 Spoke with patient. States [facility] has told her that the prescription should be ready for pick up today. Asked her to make sure that what she is picking up is the 7.5mg  and also went over how to take the medication for the next 20 days ( 2 capsules of 7.5mg  for 10 days and then 1 capsule of 7.5mg  for 10 days and then stop) -before stopping and switching to Dayvigo . Gave my direct line # for contact information and return call if there is issues

## 2024-08-28 ENCOUNTER — Ambulatory Visit (INDEPENDENT_AMBULATORY_CARE_PROVIDER_SITE_OTHER): Admitting: Medical-Surgical

## 2024-08-28 ENCOUNTER — Encounter: Payer: Self-pay | Admitting: Medical-Surgical

## 2024-08-28 VITALS — BP 146/82 | HR 61 | Resp 20 | Ht 64.0 in | Wt 201.0 lb

## 2024-08-28 DIAGNOSIS — G894 Chronic pain syndrome: Secondary | ICD-10-CM

## 2024-08-28 DIAGNOSIS — F5104 Psychophysiologic insomnia: Secondary | ICD-10-CM

## 2024-08-28 NOTE — Patient Instructions (Signed)
 D-ribose- supplement that may help with fibromyalgia, fatigue, and pain

## 2024-08-28 NOTE — Progress Notes (Signed)
 Established patient visit   History of Present Illness   Discussed the use of AI scribe software for clinical note transcription with the patient, who gave verbal consent to proceed.  History of Present Illness   Heather Thompson is a 73 year old female with chronic pain and migraine issues who presents with worsening pain and medication management concerns.  Chronic pain and sleep disturbance - Chronic pain has worsened significantly since an emergency room visit on August 3rd - Pain is persistent from waking until sleep, with relief only during sleep - Sleep is disrupted by frequent awakenings at 3 AM, with difficulty returning to sleep until 5 AM - Hydromorphone increased to 2 mg four times daily, but only taken at home to avoid driving under the influence - Tegretol  200 mg twice daily is ineffective - Spinal cord stimulator provides 50-60% pain relief - Other treatments for neck pain, including injections, have been ineffective - Pain significantly impacts daily life and causes frustration  Weight management and appetite - Difficulty managing weight due to irregular eating habits and poor metabolism - Wellbutrin  is used for weight management - Other medications are not covered by insurance or are contraindicated due to elevated blood pressure   Insomnia -Has been taking temazepam  22.5 mg nightly long-term - Was told by her pain management provider that in order to increase the frequency of Dilaudid, she would have to stop the temazepam  - At her last visit, plan for a taper over 3 weeks going down to 15 mg nightly for 10 days followed by 7.5 mg nightly for 10 days - Had trouble getting the new prescription from her pharmacy but was able to pick it up yesterday - Once taper completed, has Dayvigo  on hand to start  Physical Exam   Physical Exam Vitals reviewed.  Constitutional:      General: She is not in acute distress.    Appearance: Normal appearance. She is not  ill-appearing.  HENT:     Head: Normocephalic and atraumatic.  Cardiovascular:     Rate and Rhythm: Normal rate and regular rhythm.     Pulses: Normal pulses.     Heart sounds: Normal heart sounds. No murmur heard.    No friction rub. No gallop.  Pulmonary:     Effort: Pulmonary effort is normal. No respiratory distress.     Breath sounds: Normal breath sounds. No wheezing.  Skin:    General: Skin is warm and dry.  Neurological:     Mental Status: She is alert and oriented to person, place, and time.  Psychiatric:        Mood and Affect: Mood normal.        Behavior: Behavior normal.        Thought Content: Thought content normal.        Judgment: Judgment normal.    Assessment & Plan   Chronic pain syndrome Persistent severe neck and back pain with limited relief from previous interventions. Current medications include hydromorphone, with Tegretol  ineffective and posing hyponatremia risk. - Discontinue Tegretol . - Continue hydromorphone as needed, caution with activities requiring alertness. - Follow up with Doctor Leora on October 14.  Psychophysiologic insomnia Difficulty maintaining sleep with frequent awakenings, poor overall sleep quality despite pain relief during sleep.  Hypertension Hypertension related to obesity, requiring careful medication selection. - Blood pressure elevated on arrival and again on recheck - Given age and polypharmacy, okay with systolics in the 140s. - Continue lisinopril  20  mg and propranolol  ER 60 mg daily as prescribed.  Obesity Obesity management limited by hypertension and medication cost, with lifestyle changes challenging.  Has previously seen healthy weight and wellness but was unable to comply with 3 meals per day and unable to tolerate exercise with chronic pain.       Follow up   Return in about 3 weeks (around 09/18/2024) for insomnia/BP follow up. __________________________________ Zada FREDRIK Palin, DNP, APRN, FNP-BC [facility]

## 2024-09-01 ENCOUNTER — Ambulatory Visit: Admitting: Urology

## 2024-09-01 NOTE — Progress Notes (Deleted)
 Assessment: 1. Urge incontinence   2. OAB (overactive bladder)      Plan: Continue bladder diet  Recommend increasing Solifenacin  to 10 mg daily.  New prescription sent. I advised patient to contact me with results of increased dose in approximately 1 month. Return to office in 6 months.  Chief Complaint:  No chief complaint on file.   History of Present Illness:  Heather Thompson is a 73 y.o. female who is seen for further evaluation of OAB and urge incontinence.   She has a long history of overactive bladder symptoms with some associated incontinence.  She was previously treated at the [facility] in 2018 with oxybutynin  and Myrbetriq.  At her visit in 10/24, she reported an increase in her frequency, urgency, and urge incontinence for several months.  She was previously on oxybutynin  which was not working well in controlling her symptoms.  She was changed to Solifenacin  5 mg daily and noted a significant improvement in her symptoms with this medication.  She reported some dry mouth but no problems with constipation.  No incontinence.  She noted a decrease in her frequency and urgency as well.  No dysuria or gross hematuria.  No recent UTIs.  At her visit in April 2025, she continued on Solifenacin  5 mg nightly.  She noted a decrease in her urgency and daytime urge incontinence.  She reported nocturia 2-3 times.  She had 2 episodes of nighttime incontinence within the past 6 months.  No dysuria or gross hematuria. Her dose of solifenacin  was increased to 10 mg daily.  Portions of the above documentation were copied from a prior visit for review purposes only.   Past Medical History:  Past Medical History:  Diagnosis Date   Abnormality of gait 04/23/2019   Acquired hypothyroidism 07/29/2021   Allergic rhinitis due to animal hair and dander 12/02/2015   Formatting of this note might be different from the original.  [facility]  Seen by Dr. Leita 11/11/15      Plan- Received allergy shots today. Follow up 1 year.     Allergy    Anemia    Anemia    Anxiety    Arthritis of scaphoid-trapezium-trapezoid joint of right hand 09/10/2023   B12 deficiency    Back pain    Chronic fatigue 07/29/2021   Chronic fatigue syndrome    Chronic throat clearing 03/14/2023   Clotting disorder    Constipation    Depression with anxiety 04/07/2009   Formatting of this note might be different from the original.  well controlled on current meds, follows with psychiatrist in [place]. Had a   h/o severe depression 5 years ago, and had ECT.     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  B12 deficiency   Iron deficiency     Herniated lumbar intervertebral disc 11/12/2020   High cholesterol    History of blood clots    History of cervical spinal surgery 01/01/2018   Formatting of this note might be different from the original.  Fusion C6 and C7 per 2018 MRI report.     History of left hip replacement 01/14/2018  History of Roux-en-Y gastric bypass    Hoarseness 03/14/2023   Hyperlipidemia 08/19/2010   Hypertension    Hyponatremia 01/17/2018   Hypothyroidism    Hypothyroidism    IBS (irritable bowel syndrome) 04/06/2021   Impaired fasting glucose 08/19/2010   Incontinence 10/30/2013   Formatting of this note might be different from the original. Formatting of this note might be different from the original. Urinary Incontinence     Infertility, female    Insomnia 04/07/2009   Formatting of this note might be different from the original.  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  Followed by [facility]  Seen by Dr. Linnette 03/01/15     S/p changing thoracic spinal cord stimulator generator 08/18/14.     Lumbar spine films show spinal cord generator in left flank area. 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  Follows with Rheumatology in [facility] of this note might be different from the original.  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  Getting iron infusion via port at hematalogy/oncology center at [facility].   Was recently hospitalized in [facility] for severe anemia     Formatting of this note might be different from the original.  on OTC b12 currently     Vitamin D  deficiency     Past Surgical  History:  Past Surgical History:  Procedure Laterality Date   ABDOMINAL HYSTERECTOMY     APPENDECTOMY     CARPAL TUNNEL RELEASE Bilateral 2023   with thumb joint replacement   CERVICAL FUSION     CHOLECYSTECTOMY     FOOT SURGERY Bilateral    fusion   GASTRIC BYPASS     IR EMBO ARTERIAL NOT HEMORR HEMANG INC GUIDE ROADMAPPING  04/01/2024   IR RADIOLOGIST EVAL & MGMT  03/14/2024   IR RADIOLOGIST EVAL & MGMT  04/24/2024   SPINAL CORD STIMULATOR INSERTION  07/2023   TOTAL HIP ARTHROPLASTY Bilateral    11/2016 & 05/2017    Allergies:  Allergies  Allergen Reactions   Compazine [Prochlorperazine] Anaphylaxis   Phenothiazines Anaphylaxis and Swelling    Caused her throat to close     Prednisone Itching, Swelling and Rash    Facial swelling   Pregabalin Itching and Swelling    Swelling and itching of hands and feet.      Cefadroxil Itching    Family History:  Family History  Problem Relation Age of Onset   Hyperlipidemia Mother    Hypertension Mother    Heart attack Mother    Heart disease Mother    Sudden death Mother    Depression Mother    Anxiety disorder Mother    Obesity Mother     Anxiety disorder Father    Depression Father    Cancer Father    Hyperlipidemia Father    Hypertension Father    Lung cancer Father    Prostate cancer Father    Kidney cancer Brother    Healthy Daughter    Healthy Son     Social History:  Social History   Tobacco Use   Smoking status: Never    Passive exposure: Past   Smokeless tobacco: Never  Vaping Use   Vaping status: Never Used  Substance Use Topics   Alcohol use: Yes    Comment: rarely   Drug use: Never    ROS: Constitutional:  Negative for fever, chills, weight loss CV: Negative for chest pain, previous MI, hypertension Respiratory:  Negative for shortness of breath, wheezing, sleep apnea, frequent cough GI:  Negative for nausea, vomiting, bloody stool, GERD  Physical exam: There were no vitals taken for this visit. GENERAL APPEARANCE:  Well appearing, well developed, well nourished, NAD HEENT:  Atraumatic, normocephalic, oropharynx clear NECK:  Supple without lymphadenopathy or thyromegaly ABDOMEN:  Soft, non-tender, no masses EXTREMITIES:  Moves all extremities well, without clubbing, cyanosis, or edema NEUROLOGIC:  Alert and oriented x 3, normal gait, CN II-XII grossly intact MENTAL STATUS:  appropriate BACK:  Non-tender to palpation, No CVAT SKIN:  Warm, dry, and intact  Results: U/A:

## 2024-09-02 ENCOUNTER — Encounter: Payer: Self-pay | Admitting: Professional

## 2024-09-02 ENCOUNTER — Ambulatory Visit (INDEPENDENT_AMBULATORY_CARE_PROVIDER_SITE_OTHER): Admitting: Professional

## 2024-09-02 DIAGNOSIS — F411 Generalized anxiety disorder: Secondary | ICD-10-CM

## 2024-09-02 DIAGNOSIS — F331 Major depressive disorder, recurrent, moderate: Secondary | ICD-10-CM | POA: Diagnosis not present

## 2024-09-02 DIAGNOSIS — F4321 Adjustment disorder with depressed mood: Secondary | ICD-10-CM

## 2024-09-02 NOTE — Progress Notes (Signed)
 [facility] Counselor/Therapist Progress Note  Patient ID: Heather Thompson, MRN: [id number],    Date: 09/02/2024  Time Spent:  48 minutes 1256-144pm  Treatment Type: Individual Therapy  Risk Assessment: Danger to Self:  No Self-injurious Behavior: No Danger to Others: No  Subjective: This session was held via video teletherapy. The patient consented to video teletherapy and was located in her church for this session. She is aware it is the responsibility of the patient to secure confidentiality on her end of the session. The provider was in a private office for the duration of this session.    The patient arrived on time for her Caregility appointment.  Issues addressed: 1-physical a-neck MD appt is next week -feeling sore and tired from her move -admits that she is not in the place where moving is a challenge -she feels disappointed that her daughter/SIL did not step up and help -she never asked them to come down b-eating behavior is bad -since moving she has eaten out three times -she finally decided to go grocery shopping -she will be eating out with church family at [employer] since it is fundraiser for church -intentional eating 2-moved -she moved to an in-law apartment [place] the [place] in [place] -her landlords are church members -things happened very quickly -she can get her groceries delivered at her new address -she feels very happy with her space -she has a screened in porch the overlooks the [place] and [place] 3-speaking engagement at [employer] -she feels confident and that church is family -educated on strategies for managing emotional eating -discussed importance of meal planning, shopping from her grocery list -importance of only eating at the table and not in front of the TV -discussed difference in eating a meal out with friends and watching TV  Treatment Plan Problems: Financial Stress, Low Self-Esteem, Attention Deficit Disorder (ADD) -  Adult Symptoms: Unable to concentrate or pay attention to things of low interest, even when those things are important to his/her life. Restless and fidgety; unable to be sedentary for more than a short time. Disorganized in most areas of his/her life. Starts many projects but rarely finishes any. Chronic low self-esteem. Tendency toward addictive behaviors. Indebtedness and overdue bills that exceed ability to meet monthly payments. A long-term lack of discipline in money management that has led to excessive indebtedness. A pattern of impulsive spending that does not consider the eventual financial consequences. Inability to accept compliments. Makes self-disparaging remarks; sees self as unattractive, worthless, a loser, a burden, unimportant; takes blame easily. Difficulty in saying no to others; assumes not being liked by others. Fear of rejection by others, especially peer group. Lack of any goals for life and setting of inappropriately low goals for self. Inability to identify positive characteristics of self. Anxious and uncomfortable in social situations. Goals: Reduce impulsive actions while increasing concentration and focus on low-interest activities. Minimize ADD behavioral interference in daily life. Sustain attention and concentration for consistently longer periods of time. Achieve an inner strength to control personal impulses, cravings, and desires that directly or indirectly increase debt irresponsibly. Resolve financial crisis with a path to eliminate debt. Revise spending patterns to not exceed income. Gain a new sense of self-worth in which the substance of one's value is not attached to the capacity to do things or own things that cost money. Understand personal desires, insecurities, and anxieties that make overspending possible. Elevate self-esteem. Develop a consistent, positive self-image. Demonstrate improved self-esteem through more pride in appearance, more  assertiveness, greater eye contact, and identification of positive traits in self-talk messages. Establish an inward sense of self-worth, confidence, and competence. Interact socially without undue distress or disability. Objectives target date for all objectives is 03/25/2025: Identify the current specific ADD behaviors that cause the most difficulty. List the negative consequences of the ADD problematic behavior. Learn and implement organization and planning skills. Acknowledge procrastination and the need to reduce it. Learn and implement skills to reduce procrastination. Learn and implement skills to reduce the disruptive influence of distractibility. Combine skills learned in therapy into a new daily approach to managing ADHD. Use cognitive and behavioral strategies to control the impulse to make unnecessary and unaffordable purchases. Report instances of successful control over impulse to spend on unnecessary expenses. Increase insight into the historical and current sources of low self-esteem. Decrease the frequency of negative self-descriptive statements and increase frequency of positive self-descriptive statements. Articulate a plan to be proactive in trying to get identified needs met. Form realistic, appropriate, and attainable goals for self in all areas of life. Take verbal responsibility for accomplishments without discounting. Identify and replace negative self-talk messages used to reinforce low self-esteem. Objectives target date for all objectives is 03/13/2025: Identify the specific anxiety symptoms that are personally most disturbing or most contributing to impaired functioning. Learn and practice thought and behavioral control methods to minimize and control anxiety symptoms once they have begun. Identify specific thoughts that precipitate anxiety symptoms. Replace anxiety-producing thoughts with constructive thoughts. Verbalize an understanding of the general physical and  cognitive manifestations of the causes for anxiety. Identify and clarify the patterns of anxiety precipitants and consequences. Identify whether the symptoms of depression seem to be primarily related to interpersonal relationships, stressful life events or circumstances, thoughts/beliefs, or behaviors. Verbalize an understanding of the general physical and psychological effects of depression. Replace depression-promoting thoughts with mood-elevating thoughts. Keep a daily record of mood rating from 1 to 10, noting associated behaviors, activities, events, people, and thoughts. Identify specific thoughts that precipitate depressive moods. Monitor and report depressive symptoms, completing self-report assessment on a periodic basis. Describe and evaluate significant past and current interpersonal relationships. Tell memories of the deceased person, starting with positive memories. Consider possible ways of increasing involvement with others. Accept the reality of loss and tolerate the pain of intense grief. Engage in events and activities that increase level of social involvements. Identify and monitor specific pain triggers. Learn and implement somatic skills such as relaxation and/or biofeedback to reduce pain level. Learn mental coping skills and implement with somatic skills for managing acute pain. Interventions: Reinforce the client's belief system and/or refer to a spiritual leader who can support the client's faith. Explore and problem-solve with the client difficulties, fears, or barriers related to increasing social involvement. Point out to the client that engagement with others is associated with decreased feelings of loneliness, grief, depression, and hopelessness. Provide encouragement and support to the client for increased engagement in contact with others. Clarify with the client that expression of negative thoughts or feelings about the deceased is not disloyal. Assist the  client in remembering the loved one in realistic ways (i.e., includes attractions, positive traits, negative traits, conflicts, ambivalence, dependency, etc.), a memory that is neither idealized nor demonized, so that the client's relationship with the deceased person can be appraised realistically (or assign Dear _____: A Letter to a Lost Loved One in the Adult Psychotherapy Homework Planner, 2nd ed. by Jenniffer). Assign the client to read about progressive muscle relaxation and other  calming strategies in relevant books or treatment manuals (e.g., Progressive Relaxation Training by Thornell armin Collier). Assign a homework exercise in which the client implements somatic pain management skills and records the result; review and process during the treatment session. Teach the client distraction techniques (e.g., pleasant imagery, counting techniques, alternative focal points) and how to use them for relaxation skills for the management of acute episodes of pain. Ask the client to create a list of activities that are pleasurable to him/her (or assign Identify and Schedule Pleasant Activities in the Adult Psychotherapy Homework Planner, 2nd ed. by Jenniffer); process the list, developing a plan of increasing the frequency of engaging in the selected pleasurable activities. Teach the client problem-solving skills to apply to removal of obstacles to implementing new skills; role-play implementation of these skills to obstacles. Assist the client in identifying the most effective personal stress management techniques (e.g., prayer, walking, baking, telephoning a friend), and encourage daily scheduling of these activities (or assign Past Successful Anxiety Coping in the Adult Psychotherapy Homework Planner, 2nd ed. by Jenniffer). Teach the client the concept of finding a good match between the individual's capacities and the demands of the physical environment. If the physical environment is too demanding for the  individual's capacities (e.g., frail older adult taking care of a large house), the individual can become overwhelmed and may need to move from large home to smaller accommodations or residence for older adults). While reviewing the client's anxiety symptom chart, help him/her recognize patterns associated with anxiety symptoms: sort out precipitants from consequences, identify the most intense or frequent precipitants, and identify the consequences that help to perpetuate maladaptive patterns. Assist the client in identifying his/her current attempts to reducing anxiety (e.g., constantly telephoning family or physician, making unneeded doctor appointments or going to the emergency room), their apparent positive consequences (e.g., feeling better, getting attention), but longer-term negative consequences (e.g., physician won't return calls, friends avoid the client because of expressions of worry). Teach the client that his/her mood can be improved by increasing pleasant events and decreasing unpleasant events. Encourage the client to identify pleasant events that are desirable, but are not currently a part of his/her daily routine (see Identify and Schedule Pleasant Activities in the Adult Psychotherapy Homework Planner, 2nd ed. by Jenniffer). Develop a one-week daily schedule with the client that increases pleasant events and decreases unpleasant events, making sure to have at least one pleasant event every day. Help the client to understand his/her strengths and weaknesses in problem-solving, to view problems as challenges and not personal deficits, and to identify and define current problem(s) that will be the focus of treatment. Help the client to identify realistic goals to address problem(s) of concern and identify major obstacles in achieving goals. Encourage the client to implement chosen solutions to current life problem(s), self-monitor and evaluate solution implementation, and reward self for  efforts. Explore the relationship of negative self-talk (e.g., I'm just a burden to everyone; Everyone would be better off if I were dead) and distorted beliefs (e.g., There's no one like me living here; I'd rather never leave my room than have anyone see me in a wheelchair) to depressed mood (or assign Negative Thoughts Trigger Negative Feelings in the Adult Psychotherapy Homework Planner, 2nd ed. by Jenniffer; or Daily Record of Dysfunctional Thoughts in Cognitive Therapy of Depression by Almarie Candida Gentry and Shona). Encourage the client to distinguish between all-or-nothing thinking (e.g., Life in a nursing home can't be worth living) and genuine nuanced reflection on life's meaning  and purpose (e.g., What's giving my life meaning at this stage?); confront the former, encourage the latter.  Diagnosis:Major depressive disorder, recurrent episode, moderate (HCC)  Generalized anxiety disorder  Grief  Plan:  -complete Chapter One of Self-Love by Switch -meet again on Thursday, September 11, 2024 at 10am in person

## 2024-09-04 ENCOUNTER — Telehealth (HOSPITAL_COMMUNITY): Payer: Self-pay | Admitting: Psychiatry

## 2024-09-04 ENCOUNTER — Encounter: Payer: Self-pay | Admitting: Podiatry

## 2024-09-04 ENCOUNTER — Ambulatory Visit: Admitting: Podiatry

## 2024-09-04 DIAGNOSIS — M79675 Pain in left toe(s): Secondary | ICD-10-CM

## 2024-09-04 DIAGNOSIS — M79674 Pain in right toe(s): Secondary | ICD-10-CM

## 2024-09-04 DIAGNOSIS — L03032 Cellulitis of left toe: Secondary | ICD-10-CM | POA: Diagnosis not present

## 2024-09-04 DIAGNOSIS — B351 Tinea unguium: Secondary | ICD-10-CM | POA: Diagnosis not present

## 2024-09-04 DIAGNOSIS — G6289 Other specified polyneuropathies: Secondary | ICD-10-CM

## 2024-09-04 MED ORDER — DOXYCYCLINE HYCLATE 100 MG PO TABS: 100.0000 mg | ORAL_TABLET | Freq: Two times a day (BID) | ORAL | 0 refills | Status: AC

## 2024-09-04 NOTE — Progress Notes (Addendum)
 Subjective:  Patient ID: Heather Thompson, female    DOB: 1951-02-15,   MRN: [id number]  Chief Complaint  Patient presents with   Nail Problem    My appointment was made mainly to have my toenails clipped.  Sunday I banged my left big toenail.  I was putting Triple Antibiotic on it.  The nail is definitely loose.    73 y.o. female presents for concern of thickened elongated and painful nails that are difficult to trim. Requesting to have them trimmed today. Relates she banged her left great toenail on Sunday and has been keeping neosporin and a band aid but nail is loose. Denies much pain. She is pre-diabetic and history of significant neuropathy She has dealt with previous cellulitis and infections and has trouble healing.   PCP:  Willo Mini, NP    . Denies any other pedal complaints. Denies n/v/f/c.   Past Medical History:  Diagnosis Date   Abnormality of gait 04/23/2019   Acquired hypothyroidism 07/29/2021   Allergic rhinitis due to animal hair and dander 12/02/2015   Formatting of this note might be different from the original.  [facility]  Seen by Dr. Leita 11/11/15     Plan- Received allergy shots today. Follow up 1 year.     Allergy    Anemia    Anemia    Anxiety    Arthritis of scaphoid-trapezium-trapezoid joint of right hand 09/10/2023   B12 deficiency    Back pain    Chronic fatigue 07/29/2021   Chronic fatigue syndrome    Chronic throat clearing 03/14/2023   Clotting disorder    Constipation    Depression with anxiety 04/07/2009   Formatting of this note might be different from the original.  well controlled on current meds, follows with psychiatrist in [place]. Had a   h/o severe depression 5 years ago, and had ECT.     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  B12 deficiency   Iron deficiency     Herniated lumbar intervertebral disc 11/12/2020   High cholesterol    History of blood clots    History of cervical spinal surgery 01/01/2018   Formatting of this note might be different from the original.  Fusion C6 and C7 per 2018 MRI report.     History of left hip replacement 01/14/2018   History of Roux-en-Y gastric bypass    Hoarseness 03/14/2023   Hyperlipidemia 08/19/2010   Hypertension    Hyponatremia 01/17/2018   Hypothyroidism    Hypothyroidism    IBS (irritable bowel syndrome) 04/06/2021   Impaired fasting glucose 08/19/2010   Incontinence 10/30/2013   Formatting of this note might be different from the original. Formatting of this note might be different from the original. Urinary Incontinence     Infertility, female    Insomnia 04/07/2009   Formatting of this note might be different from the original.  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  Followed by [facility]  Seen by Dr. Linnette 03/01/15     S/p changing thoracic spinal cord stimulator generator 08/18/14.     Lumbar spine films show spinal cord generator in left flank area. 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  Follows with Rheumatology in [facility] of this  note might be different from the original.  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  Getting iron infusion via port at hematalogy/oncology center at [facility].   Was recently hospitalized in [facility] for severe anemia     Formatting of this note might be different from the original.  on OTC b12 currently     Vitamin D  deficiency      Objective:  Physical Exam: Vascular: DP/PT pulses 2/4 bilateral. CFT <3 seconds. Feet cold to touch. Absent hair growth on digits. Edema noted to bilateral lower extremities. Xerosis noted bilaterally.  Skin. No lacerations or abrasions bilateral feet. Nails 1-5 bilateral  are thickened discolored and elongated with subungual debris. Left hallux nail loosened with undelrying blood blister noted and serous drainage present. Surrounding erythema and edema noted as well.  Musculoskeletal: MMT 5/5 bilateral lower extremities in DF, PF, Inversion and Eversion. Deceased ROM in DF of ankle joint.  Neurological: Sensation intact to light touch. Protective sensation diminished bilateral.    Assessment:   1. Paronychia of great toe of left foot   2. Pain due to onychomycosis of toenails of both feet   3. Other polyneuropathy      Plan:  Patient was evaluated and treated and all questions answered. Discussed ingrown and damaged toenails etiology and treatment options including procedure for removal vs conservative care.  Patient requesting removal of ingrown nail today. Procedure below.  -Doxycycline  sent for cellulitis  Discussed procedure and post procedure care and patient expressed understanding.  Will follow-up in 2 weeks for nail check or sooner if any problems arise.   -Discussed and educated patient on diabetic foot care, especially with  regards to the vascular, neurological and musculoskeletal systems.  -Stressed the importance of good glycemic control and the detriment of not  controlling glucose levels in relation to the foot. -Discussed supportive shoes at all times and checking feet regularly.  -Mechanically debrided all nails 1-5 bilateral using sterile nail nipper and filed with dremel without incident  -Answered all patient questions -Patient to return  in 3 months for at risk foot care -Patient advised to call the office if any problems or questions arise in the  meantime.    Procedure:  Procedure: total Nail Avulsion of left hallux nail Surgeon: Asberry Failing, DPM  Pre-op Dx: Ingrown toenail with infection Post-op: Same  Place of Surgery: Office exam room.  Indications for surgery: Painful and ingrown toenail.    The patient is requesting removal of nail without  chemical matrixectomy. Risks and complications were discussed with the patient for which they understand and written consent was obtained. Under sterile conditions the skin was prepped in sterile fashion.No anesthesia required due to neuropathy.  A tourniquet was then applied. Next the entire hallux nail was removed. copiously irrigated. Silvadene was applied. A dry sterile dressing was applied. After application of the dressing the tourniquet was removed and there is found to be an immediate capillary refill time to the digit. The patient tolerated the procedure well without any complications. Post procedure instructions were discussed the patient for which he verbally understood. Follow-up in two weeks for nail check or sooner if any problems are to arise. Discussed signs/symptoms of infection and directed to call the office immediately should any occur or go directly to the emergency room. In the meantime, encouraged to call the office with any questions, concerns, changes symptoms.   Asberry Failing, DPM

## 2024-09-04 NOTE — Telephone Encounter (Signed)
 Patient called to cancel her follow up appointment with provider. States I'm changing some things in my life and want to cancel this appointment. Does not plan to reschedule.

## 2024-09-04 NOTE — Patient Instructions (Signed)

## 2024-09-05 ENCOUNTER — Other Ambulatory Visit: Payer: Self-pay

## 2024-09-05 ENCOUNTER — Ambulatory Visit

## 2024-09-05 ENCOUNTER — Ambulatory Visit
Admission: EM | Admit: 2024-09-05 | Discharge: 2024-09-05 | Disposition: A | Discharge status: Home / Self Care | Attending: Family Medicine | Admitting: Family Medicine

## 2024-09-05 DIAGNOSIS — S90212A Contusion of left great toe with damage to nail, initial encounter: Secondary | ICD-10-CM

## 2024-09-05 MED ORDER — NYSTATIN 100000 UNIT/ML MT SUSP: 5.0000 mL | Freq: Four times a day (QID) | OROMUCOSAL | 1 refills | Status: DC

## 2024-09-05 MED ORDER — MUPIROCIN 2 % EX OINT: TOPICAL_OINTMENT | CUTANEOUS | 0 refills | Status: DC

## 2024-09-05 MED ORDER — FLUCONAZOLE 150 MG PO TABS: ORAL_TABLET | ORAL | 0 refills | Status: DC

## 2024-09-05 NOTE — ED Triage Notes (Addendum)
 Increasing pain to left 1st toe since having the nail removed yesterday. On doxycycline .

## 2024-09-05 NOTE — ED Provider Notes (Signed)
 Heather Thompson CARE    CSN: [id number] Arrival date & time: 09/05/24  1114      History   Chief Complaint No chief complaint on file.   HPI Heather Thompson is a 73 y.o. female.   Patient presents with increasing pain of her left great toe. She reports that five days ago she accidentally struck her left great toe against furniture.  She had visited her podiatrist yesterday because of toe pain resulting from ingrown nail with infection.  She underwent total nail avulsion of her left great toe and was started on doxycycline .  Review of records reveals that she was to soak her toe twice daily and change bandage after each soak.  She denies fevers, chills, and sweats.  She denies pain of her left foot and leg superior to her left great toe, but complains of persistent left great toe pain.  The history is provided by the patient.    Past Medical History:  Diagnosis Date   Abnormality of gait 04/23/2019   Acquired hypothyroidism 07/29/2021   Allergic rhinitis due to animal hair and dander 12/02/2015   Formatting of this note might be different from the original.  [facility]  Seen by Dr. Leita 11/11/15     Plan- Received allergy shots today. Follow up 1 year.     Allergy    Anemia    Anemia    Anxiety    Arthritis of scaphoid-trapezium-trapezoid joint of right hand 09/10/2023   B12 deficiency    Back pain    Chronic fatigue 07/29/2021   Chronic fatigue syndrome    Chronic throat clearing 03/14/2023   Clotting disorder    Constipation    Depression with anxiety 04/07/2009   Formatting of this note might be different from the original.  well controlled on current meds, follows with psychiatrist in [place]. Had a   h/o severe depression 5 years ago, and had ECT.     Dysphagia 04/17/2023   Edema of both lower extremities    Elevated liver enzymes 04/02/2023   Elevated TSH 04/02/2023   Fatty liver    Fibromyalgia    Gallbladder problem     Gastroesophageal reflux disease 06/18/2018   Globus sensation 03/14/2023   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   Formatting of this note might be different from the original.  B12 deficiency   Iron deficiency     Herniated lumbar intervertebral disc 11/12/2020   High cholesterol    History of blood clots    History of cervical spinal surgery 01/01/2018   Formatting of this note might be different from the original.  Fusion C6 and C7 per 2018 MRI report.     History of left hip replacement 01/14/2018   History of Roux-en-Y gastric bypass    Hoarseness 03/14/2023   Hyperlipidemia 08/19/2010   Hypertension    Hyponatremia 01/17/2018   Hypothyroidism    Hypothyroidism    IBS (irritable bowel syndrome) 04/06/2021   Impaired fasting glucose 08/19/2010   Incontinence 10/30/2013   Formatting of this note might be different from the original. Formatting of this note might be different from the original. Urinary Incontinence     Infertility, female    Insomnia 04/07/2009   Formatting of this note might be different from the original.  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  Followed by [facility]  Seen by Dr. Linnette 03/01/15     S/p changing thoracic spinal cord stimulator generator 08/18/14.     Lumbar spine films show spinal cord generator in left flank area. 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  Follows with  Rheumatology in [facility] of this note might be different from the original.  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  Getting iron infusion via port at hematalogy/oncology center at [facility].   Was recently hospitalized in [facility] for severe anemia     Formatting of this note might be different from the original.  on OTC b12  currently     Vitamin D  deficiency     Patient Active Problem List   Diagnosis Date Noted   Onychodystrophy 08/14/2024   Oral candidiasis 07/08/2024   Cellulitis of left foot 06/30/2024   Chronic bilateral hip pain after total replacement of both hip joints 06/18/2024   Left leg pain 06/18/2024   Mononeuropathy 12/14/2023   Palpitations 10/04/2023   Cardiac murmur 10/04/2023   Allergy    Anemia    Anxiety    B12 deficiency    Back pain    Chronic fatigue syndrome    Clotting disorder    Constipation    Edema of both lower extremities    Fatty liver    Fibromyalgia    Gallbladder problem    High cholesterol    History of blood clots  History of Roux-en-Y gastric bypass    Hypothyroidism    Infertility, female    Joint pain    Kidney stones    Muscle disorder    Osteoarthritis    Prediabetes    Salzmann nodular degeneration    SOBOE (shortness of breath on exertion)    Swallowing difficulty    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   Arthritis of scaphoid-trapezium-trapezoid joint of right hand 09/10/2023   Urge incontinence 08/28/2023   OAB (overactive bladder) 08/28/2023   Lumbar post-laminectomy syndrome 07/06/2023   Snapping hip syndrome, 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   Other myositis, left thigh 04/24/2023   Sacroiliitis, not elsewhere classified 04/24/2023   Dysphagia 04/17/2023   Elevated liver enzymes 04/02/2023   Elevated TSH 04/02/2023   Chronic throat clearing 03/14/2023   Globus sensation 03/14/2023   Hoarseness 03/14/2023   Muscle tension dysphonia 03/12/2023   Unspecified inflammatory spondylopathy, 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   Primary osteoarthritis of both knees 12/21/2021   Port-A-Cath in place 07/29/2021   Acquired hypothyroidism 07/29/2021   Chronic pain syndrome 07/29/2021   Vitamin B12 deficiency 07/29/2021   Vitamin D  deficiency 07/29/2021   Postmenopausal estrogen deficiency 07/29/2021   IBS (irritable bowel syndrome) 04/06/2021    Retention of urine 04/06/2021   Herniated lumbar intervertebral disc 11/12/2020   Skin sensation disturbance 11/12/2020   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   Spondylopathy, unspecified 11/12/2020   Trochanteric bursitis of both hips 11/19/2019   Low serum iron 04/23/2019   Osteomalacia 04/23/2019   Abnormality of gait 04/23/2019   History of bilateral hip arthroplasty 06/26/2018   Gastroesophageal reflux disease 06/18/2018   Hypertension 06/18/2018   Intertrigo 04/30/2018   Lentigines 04/30/2018   Hyponatremia 01/17/2018   History of cervical spinal surgery 01/01/2018   Obesity (BMI 30-39.9) 01/01/2018   SK (seborrheic keratosis) 04/18/2017   Pain management contract agreement 11/17/2016   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   Allergic rhinitis due to animal hair and dander 12/02/2015   Low back pain 03/03/2015   Incontinence 10/30/2013   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   Neutrophilic leukocytosis 08/12/2012   Thrombocytosis 08/12/2012   Osteoporosis, senile 11/15/2010   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   Hyperlipidemia 08/19/2010   Impaired fasting glucose 08/19/2010   Polyarthritis 09/28/2009   Degenerative disc disease 04/07/2009   Depression with anxiety 04/07/2009   Myopathy 04/07/2009   Insomnia 04/07/2009   Migraine 04/07/2009   Scoliosis 04/07/2009   Seasonal allergies 04/07/2009   Vitamin B12 deficiency anemia 04/07/2009    Past Surgical History:  Procedure Laterality Date   ABDOMINAL HYSTERECTOMY     APPENDECTOMY     CARPAL TUNNEL RELEASE Bilateral 2023   with thumb joint replacement   CERVICAL FUSION     CHOLECYSTECTOMY     FOOT SURGERY Bilateral    fusion   GASTRIC BYPASS     IR EMBO ARTERIAL NOT HEMORR HEMANG INC GUIDE ROADMAPPING  04/01/2024   IR RADIOLOGIST EVAL & MGMT  03/14/2024   IR RADIOLOGIST EVAL & MGMT  04/24/2024   SPINAL CORD STIMULATOR INSERTION  07/2023   TOTAL HIP ARTHROPLASTY Bilateral    11/2016 & 05/2017    OB History     Gravida  2    Para  2   Term      Preterm      AB      Living         SAB      IAB      Ectopic  Multiple      Live Births               Home Medications    Prior to Admission medications   Medication Sig Start Date End Date Taking? Authorizing Provider  fluconazole (DIFLUCAN) 150 MG tablet Take 1 tablet (150 mg total) by mouth once for 1 dose. May repeat in 72 hours. 09/05/24  Yes Pauline Garnette LABOR, MD  mupirocin ointment (BACTROBAN) 2 % Apply to great toe BID with each dressing change 09/05/24  Yes Pauline Garnette LABOR, MD  nystatin  (MYCOSTATIN ) 100000 UNIT/ML suspension Take 5 mLs (500,000 Units total) by mouth 4 (four) times daily. Retain in mouth as long as possible 09/05/24  Yes Luvern Mischke, Garnette LABOR, MD  buPROPion  (WELLBUTRIN  XL) 300 MG 24 hr tablet Take 1 tablet (300 mg total) by mouth daily. Take in the evening 06/12/24   Willo Mini, NP  clotrimazole  (LOTRIMIN ) 1 % cream Apply 1 Application topically 2 (two) times daily. Apply under breasts and between toes. 07/11/24   Willo Mini, NP  desvenlafaxine  (PRISTIQ ) 100 MG 24 hr tablet Take 1 tablet (100 mg total) by mouth daily. 07/21/24   Mozingo, Regina Nattalie, NP  doxycycline  (VIBRA -TABS) 100 MG tablet Take 1 tablet (100 mg total) by mouth 2 (two) times daily for 10 days. 09/04/24 09/14/24  Joya Stabs, DPM  FLUoxetine  (PROZAC ) 10 MG capsule Take 1 capsule (10 mg total) by mouth daily. 07/21/24   Mozingo, Regina Nattalie, NP  gabapentin  (NEURONTIN ) 300 MG capsule Take 300 mg by mouth 3 (three) times daily. 08/05/24   [provider]  HYDROmorphone (DILAUDID) 2 MG tablet Take 2 mg by mouth 2 (two) times daily as needed for severe pain (pain score 7-10).    [provider]  Lemborexant  (DAYVIGO ) 10 MG TABS Take 1 tablet (10 mg total) by mouth at bedtime. 08/14/24   Willo Mini, NP  lisinopril  (ZESTRIL ) 20 MG tablet Take 1 tablet (20 mg total) by mouth daily. 07/08/24 10/06/24  Willo Mini, NP  Magnesium 250 MG TABS Take  250 mg by mouth at bedtime.    [provider]  nystatin  (MYCOSTATIN /NYSTOP ) powder APPLY TO THE AFFECTED AREA(S) EXTERNALLY TWICE DAILY 08/19/24   Willo Mini, NP  propranolol  ER (INDERAL  LA) 60 MG 24 hr capsule Take 1 capsule (60 mg total) by mouth daily. 07/29/24   Willo Mini, NP  solifenacin  (VESICARE ) 10 MG tablet Take 1 tablet (10 mg total) by mouth daily. 02/28/24   Stoneking, Adine PARAS., MD  temazepam  (RESTORIL ) 7.5 MG capsule Take 2 capsules (15 mg total) by mouth at bedtime as needed for 10 days for sleep, THEN 1 capsule (7.5 mg total) at bedtime as needed for up to 10 days for sleep. Then STOP. 08/15/24 09/04/24  Willo Mini, NP  tiZANidine  (ZANAFLEX ) 2 MG tablet TAKE 1 TABLET (2 MG TOTAL) BY MOUTH IN THE MORNING AND AT BEDTIME. 06/25/23   Breeback, Jade L, PA-C  traMADol  (ULTRAM ) 50 MG tablet Take 50 mg by mouth every 6 (six) hours as needed for moderate pain (pain score 4-6). 06/24/24   [provider]    Family History Family History  Problem Relation Age of Onset   Hyperlipidemia Mother    Hypertension Mother    Heart attack Mother    Heart disease Mother    Sudden death Mother    Depression Mother    Anxiety disorder Mother    Obesity Mother    Anxiety disorder Father    Depression  Father    Cancer Father    Hyperlipidemia Father    Hypertension Father    Lung cancer Father    Prostate cancer Father    Kidney cancer Brother    Healthy Daughter    Healthy Son     Social History Social History   Tobacco Use   Smoking status: Never    Passive exposure: Past   Smokeless tobacco: Never  Vaping Use   Vaping status: Never Used  Substance Use Topics   Alcohol use: Not Currently    Comment: rarely   Drug use: Never     Allergies   Compazine [prochlorperazine], Phenothiazines, Prednisone, Pregabalin, and Cefadroxil   Review of Systems Review of Systems  Constitutional:  Positive for activity change. Negative for appetite change, chills,  diaphoresis, fatigue and fever.  Musculoskeletal:        Left great toe pain  Skin:  Positive for color change.     Physical Exam Triage Vital Signs ED Triage Vitals  Encounter Vitals Group     BP 09/05/24 1125 (!) 193/84     Girls Systolic BP Percentile --      Girls Diastolic BP Percentile --      Boys Systolic BP Percentile --      Boys Diastolic BP Percentile --      Pulse Rate 09/05/24 1125 62     Resp 09/05/24 1125 16     Temp 09/05/24 1125 98 F (36.7 C)     Temp src --      SpO2 09/05/24 1125 94 %     Weight --      Height --      Head Circumference --      Peak Flow --      Pain Score 09/05/24 1126 7     Pain Loc --      Pain Education --      Exclude from Growth Chart --    No data found.  Updated Vital Signs BP (!) 193/84   Pulse 62   Temp 98 F (36.7 C)   Resp 16   SpO2 94%   Visual Acuity Right Eye Distance:   Left Eye Distance:   Bilateral Distance:    Right Eye Near:   Left Eye Near:    Bilateral Near:     Physical Exam Vitals and nursing note reviewed.  Constitutional:      General: She is not in acute distress.    Appearance: She is not ill-appearing.  HENT:     Head: Normocephalic.  Eyes:     Pupils: Pupils are equal, round, and reactive to light.  Cardiovascular:     Rate and Rhythm: Normal rate.  Pulmonary:     Effort: Pulmonary effort is normal.  Musculoskeletal:       Legs:     Comments: Dorsum of left foot is swollen and mildly tender to palpation, without erythema or warmth.  After removal of bandage from left great toe, nail bed appears moist without purulent discharge or swelling.  Cap refill is adequate.  Skin:    General: Skin is warm and dry.  Neurological:     Mental Status: She is alert.      UC Treatments / Results  Labs (all labs ordered are listed, but only abnormal results are displayed) Labs Reviewed - No data to display  EKG   Radiology DG Toe Great Left Result Date: 09/05/2024 EXAM: 1 VIEW XRAY  OF THE TOES 09/05/2024  12:10:27 PM COMPARISON: None available. CLINICAL HISTORY: Toe injury 5 days ago. Pt hit her toe and the toe is now infected. FINDINGS: BONES AND JOINTS: Joint space narrowing of the interphalangeal joint. No acute fracture. No focal osseous lesion. No joint dislocation. SOFT TISSUES: Diffuse soft tissue edema. An open wound is suggested at the base of the nail bed. IMPRESSION: 1. Diffuse soft tissue edema with a suggested open wound at the base of the nail bed. 2. Joint space narrowing of the interphalangeal joint. Electronically signed by: Lonni Necessary MD 09/05/2024 12:33 PM EDT RP Workstation: [id number]    Procedures Procedures (including critical care time)  Medications Ordered in UC Medications - No data to display  Initial Impression / Assessment and Plan / UC Course  I have reviewed the triage vital signs and the nursing notes.  Pertinent labs & imaging results that were available during my care of the patient were reviewed by me and considered in my medical decision making (see chart for details).    X-ray left great toe shows no evidence fracture resulting from previous trauma. Patient reports minimal left great toe pain after removing bandage.  No evidence significant cellulitis.  Will add daily mupirocin ointment with each bandage change.  Continue doxycycline .  Rx for Diflucan and nystatin  oral suspension at patient's request. Followup with Family Doctor if not improving.  Final Clinical Impressions(s) / UC Diagnoses   Final diagnoses:  Contusion of left great toe with damage to nail, initial encounter     Discharge Instructions      Please soak toe twice daily as per Dr. Buelah instructions and change bandage after each soak.  Elevate foot and leg as much as possible while healing.  If symptoms become significantly worse during the night or over the weekend, proceed to the local emergency room.     ED Prescriptions     Medication Sig  Dispense Auth. Provider   mupirocin ointment (BACTROBAN) 2 % Apply to great toe BID with each dressing change 22 g Pauline Garnette LABOR, MD   nystatin  (MYCOSTATIN ) 100000 UNIT/ML suspension Take 5 mLs (500,000 Units total) by mouth 4 (four) times daily. Retain in mouth as long as possible 60 mL Pauline Garnette LABOR, MD   fluconazole (DIFLUCAN) 150 MG tablet Take 1 tablet (150 mg total) by mouth once for 1 dose. May repeat in 72 hours. 2 tablet Pauline Garnette LABOR, MD         Pauline Garnette LABOR, MD 09/06/24 2213

## 2024-09-05 NOTE — Discharge Instructions (Signed)
 Please soak toe twice daily as per Dr. Buelah instructions and change bandage after each soak.  Elevate foot and leg as much as possible while healing.  If symptoms become significantly worse during the night or over the weekend, proceed to the local emergency room.

## 2024-09-11 ENCOUNTER — Encounter: Payer: Self-pay | Admitting: Professional

## 2024-09-11 ENCOUNTER — Ambulatory Visit: Admitting: Professional

## 2024-09-11 ENCOUNTER — Encounter: Payer: Self-pay | Admitting: Medical-Surgical

## 2024-09-11 ENCOUNTER — Ambulatory Visit (INDEPENDENT_AMBULATORY_CARE_PROVIDER_SITE_OTHER): Admitting: Medical-Surgical

## 2024-09-11 VITALS — BP 186/70 | HR 68 | Resp 20 | Ht 64.0 in | Wt 203.0 lb

## 2024-09-11 DIAGNOSIS — F5104 Psychophysiologic insomnia: Secondary | ICD-10-CM | POA: Diagnosis not present

## 2024-09-11 DIAGNOSIS — F419 Anxiety disorder, unspecified: Secondary | ICD-10-CM

## 2024-09-11 DIAGNOSIS — L603 Nail dystrophy: Secondary | ICD-10-CM

## 2024-09-11 DIAGNOSIS — F331 Major depressive disorder, recurrent, moderate: Secondary | ICD-10-CM | POA: Diagnosis not present

## 2024-09-11 DIAGNOSIS — I10 Essential (primary) hypertension: Secondary | ICD-10-CM

## 2024-09-11 DIAGNOSIS — R600 Localized edema: Secondary | ICD-10-CM

## 2024-09-11 DIAGNOSIS — F411 Generalized anxiety disorder: Secondary | ICD-10-CM | POA: Diagnosis not present

## 2024-09-11 DIAGNOSIS — F4321 Adjustment disorder with depressed mood: Secondary | ICD-10-CM | POA: Diagnosis not present

## 2024-09-11 MED ORDER — FUROSEMIDE 20 MG PO TABS: ORAL_TABLET | ORAL | 0 refills | Status: DC

## 2024-09-11 NOTE — Progress Notes (Signed)
 [facility] Counselor/Therapist Progress Note  Patient ID: Heather Thompson, MRN: [id number],    Date: 09/11/2024  Time Spent:  44 minutes [id number]  Treatment Type: Individual Therapy  Risk Assessment: Danger to Self:  No Self-injurious Behavior: No Danger to Others: No  Subjective: This session was held via video teletherapy. The patient consented to video teletherapy and was located in her church for this session. She is aware it is the responsibility of the patient to secure confidentiality on her end of the session. The provider was in a private office for the duration of this session.    The patient arrived Late for her Caregility appointment.  Issues addressed: 1-homework -complete Chapter One of Self-Love by Switch -pt has completed two days completed -pt shared her entries -pt wants to stop being so critical or herself (good enough for her friends be good enough for me) -learn to love my body -enjoy the bad food but not so often -pt considering an adult 3 wheel bike that a friend is selling -day 2 choosing self kindness over criticism -pt was proud of herself -pt felt kind of okay but could not use good -being outgoing enough was difficult for her -forgiving herself for poor eating -I deserve to be happy, Miquel wanted it 2-physical a-had appt with neurosurgeon -she will be having cervical fusion (C1-C2) in November 17th 3-moved -has not made progress with getting settled in -she has had a few people give her some time to help get her kitchen [place]) and br organized with landlord Sally's assistance -she reached out to [employer] and he does not help the help -Lemond put out a bulletin to try and get some help -things are such a mess 4-grief -doesn't have any interest in decorating since her spouse died -a friend helped her 3-speaking engagement at church -  Treatment Plan Problems: Financial Stress, Low Self-Esteem, Attention Deficit  Disorder (ADD) - Adult Symptoms: Unable to concentrate or pay attention to things of low interest, even when those things are important to his/her life. Restless and fidgety; unable to be sedentary for more than a short time. Disorganized in most areas of his/her life. Starts many projects but rarely finishes any. Chronic low self-esteem. Tendency toward addictive behaviors. Indebtedness and overdue bills that exceed ability to meet monthly payments. A long-term lack of discipline in money management that has led to excessive indebtedness. A pattern of impulsive spending that does not consider the eventual financial consequences. Inability to accept compliments. Makes self-disparaging remarks; sees self as unattractive, worthless, a loser, a burden, unimportant; takes blame easily. Difficulty in saying no to others; assumes not being liked by others. Fear of rejection by others, especially peer group. Lack of any goals for life and setting of inappropriately low goals for self. Inability to identify positive characteristics of self. Anxious and uncomfortable in social situations. Goals: Reduce impulsive actions while increasing concentration and focus on low-interest activities. Minimize ADD behavioral interference in daily life. Sustain attention and concentration for consistently longer periods of time. Achieve an inner strength to control personal impulses, cravings, and desires that directly or indirectly increase debt irresponsibly. Resolve financial crisis with a path to eliminate debt. Revise spending patterns to not exceed income. Gain a new sense of self-worth in which the substance of one's value is not attached to the capacity to do things or own things that cost money. Understand personal desires, insecurities, and anxieties that make overspending possible. Elevate self-esteem. Develop a consistent, positive self-image. Demonstrate  improved self-esteem through more pride in  appearance, more assertiveness, greater eye contact, and identification of positive traits in self-talk messages. Establish an inward sense of self-worth, confidence, and competence. Interact socially without undue distress or disability. Objectives target date for all objectives is 03/25/2025: Identify the current specific ADD behaviors that cause the most difficulty. List the negative consequences of the ADD problematic behavior. Learn and implement organization and planning skills. Acknowledge procrastination and the need to reduce it. Learn and implement skills to reduce procrastination. Learn and implement skills to reduce the disruptive influence of distractibility. Combine skills learned in therapy into a new daily approach to managing ADHD. Use cognitive and behavioral strategies to control the impulse to make unnecessary and unaffordable purchases. Report instances of successful control over impulse to spend on unnecessary expenses. Increase insight into the historical and current sources of low self-esteem. Decrease the frequency of negative self-descriptive statements and increase frequency of positive self-descriptive statements. Articulate a plan to be proactive in trying to get identified needs met. Form realistic, appropriate, and attainable goals for self in all areas of life. Take verbal responsibility for accomplishments without discounting. Identify and replace negative self-talk messages used to reinforce low self-esteem. Objectives target date for all objectives is 03/13/2025: Identify the specific anxiety symptoms that are personally most disturbing or most contributing to impaired functioning. Learn and practice thought and behavioral control methods to minimize and control anxiety symptoms once they have begun. Identify specific thoughts that precipitate anxiety symptoms. Replace anxiety-producing thoughts with constructive thoughts. Verbalize an understanding of the  general physical and cognitive manifestations of the causes for anxiety. Identify and clarify the patterns of anxiety precipitants and consequences. Identify whether the symptoms of depression seem to be primarily related to interpersonal relationships, stressful life events or circumstances, thoughts/beliefs, or behaviors. Verbalize an understanding of the general physical and psychological effects of depression. Replace depression-promoting thoughts with mood-elevating thoughts. Keep a daily record of mood rating from 1 to 10, noting associated behaviors, activities, events, people, and thoughts. Identify specific thoughts that precipitate depressive moods. Monitor and report depressive symptoms, completing self-report assessment on a periodic basis. Describe and evaluate significant past and current interpersonal relationships. Tell memories of the deceased person, starting with positive memories. Consider possible ways of increasing involvement with others. Accept the reality of loss and tolerate the pain of intense grief. Engage in events and activities that increase level of social involvements. Identify and monitor specific pain triggers. Learn and implement somatic skills such as relaxation and/or biofeedback to reduce pain level. Learn mental coping skills and implement with somatic skills for managing acute pain. Interventions: Reinforce the client's belief system and/or refer to a spiritual leader who can support the client's faith. Explore and problem-solve with the client difficulties, fears, or barriers related to increasing social involvement. Point out to the client that engagement with others is associated with decreased feelings of loneliness, grief, depression, and hopelessness. Provide encouragement and support to the client for increased engagement in contact with others. Clarify with the client that expression of negative thoughts or feelings about the deceased is not  disloyal. Assist the client in remembering the loved one in realistic ways (i.e., includes attractions, positive traits, negative traits, conflicts, ambivalence, dependency, etc.), a memory that is neither idealized nor demonized, so that the client's relationship with the deceased person can be appraised realistically (or assign Dear _____: A Letter to a Lost Loved One in the Adult Psychotherapy Homework Planner, 2nd ed. by Jenniffer). Assign the  client to read about progressive muscle relaxation and other calming strategies in relevant books or treatment manuals (e.g., Progressive Relaxation Training by Thornell armin Collier). Assign a homework exercise in which the client implements somatic pain management skills and records the result; review and process during the treatment session. Teach the client distraction techniques (e.g., pleasant imagery, counting techniques, alternative focal points) and how to use them for relaxation skills for the management of acute episodes of pain. Ask the client to create a list of activities that are pleasurable to him/her (or assign Identify and Schedule Pleasant Activities in the Adult Psychotherapy Homework Planner, 2nd ed. by Jenniffer); process the list, developing a plan of increasing the frequency of engaging in the selected pleasurable activities. Teach the client problem-solving skills to apply to removal of obstacles to implementing new skills; role-play implementation of these skills to obstacles. Assist the client in identifying the most effective personal stress management techniques (e.g., prayer, walking, baking, telephoning a friend), and encourage daily scheduling of these activities (or assign Past Successful Anxiety Coping in the Adult Psychotherapy Homework Planner, 2nd ed. by Jenniffer). Teach the client the concept of finding a good match between the individual's capacities and the demands of the physical environment. If the physical environment is  too demanding for the individual's capacities (e.g., frail older adult taking care of a large house), the individual can become overwhelmed and may need to move from large home to smaller accommodations or residence for older adults). While reviewing the client's anxiety symptom chart, help him/her recognize patterns associated with anxiety symptoms: sort out precipitants from consequences, identify the most intense or frequent precipitants, and identify the consequences that help to perpetuate maladaptive patterns. Assist the client in identifying his/her current attempts to reducing anxiety (e.g., constantly telephoning family or physician, making unneeded doctor appointments or going to the emergency room), their apparent positive consequences (e.g., feeling better, getting attention), but longer-term negative consequences (e.g., physician won't return calls, friends avoid the client because of expressions of worry). Teach the client that his/her mood can be improved by increasing pleasant events and decreasing unpleasant events. Encourage the client to identify pleasant events that are desirable, but are not currently a part of his/her daily routine (see Identify and Schedule Pleasant Activities in the Adult Psychotherapy Homework Planner, 2nd ed. by Jenniffer). Develop a one-week daily schedule with the client that increases pleasant events and decreases unpleasant events, making sure to have at least one pleasant event every day. Help the client to understand his/her strengths and weaknesses in problem-solving, to view problems as challenges and not personal deficits, and to identify and define current problem(s) that will be the focus of treatment. Help the client to identify realistic goals to address problem(s) of concern and identify major obstacles in achieving goals. Encourage the client to implement chosen solutions to current life problem(s), self-monitor and evaluate solution implementation,  and reward self for efforts. Explore the relationship of negative self-talk (e.g., I'm just a burden to everyone; Everyone would be better off if I were dead) and distorted beliefs (e.g., There's no one like me living here; I'd rather never leave my room than have anyone see me in a wheelchair) to depressed mood (or assign Negative Thoughts Trigger Negative Feelings in the Adult Psychotherapy Homework Planner, 2nd ed. by Jenniffer; or Daily Record of Dysfunctional Thoughts in Cognitive Therapy of Depression by Almarie Candida Gentry and Shona). Encourage the client to distinguish between all-or-nothing thinking (e.g., Life in a nursing home can't be  worth living) and genuine nuanced reflection on life's meaning and purpose (e.g., What's giving my life meaning at this stage?); confront the former, encourage the latter.  Diagnosis:Major depressive disorder, recurrent episode, moderate (HCC)  Generalized anxiety disorder  Grief  Plan:  -complete at least one day of Self-Love book -meet again on Thursday, September 18, 2024 at 12pm in person

## 2024-09-11 NOTE — Progress Notes (Signed)
 Established patient visit   History of Present Illness   Discussed the use of AI scribe software for clinical note transcription with the patient, who gave verbal consent to proceed.  History of Present Illness   Heather Thompson is a 73 year old female with hypertension and anxiety who presents with concerns about medication management and swelling.  Anxiety and sleep disturbance - Significant anxiety exacerbated by a recent move and ongoing stress from living out of boxes - Worsening anxiety following reduction of temazepam , with increased frequency of vivid dreams - Transitioning to Dayvigo  for sleep management - Recent move has contributed to increased anxiety  Hypertension - Elevated blood pressure, most recent reading 132/unknown - Blood pressure perceived as elevated due to stress and anxiety - Currently taking lisinopril  20mg  and propranolol  LA 60mg  daily for blood pressure management  Lower extremity edema and pain - Swelling in the legs, particularly pronounced in the knee awaiting replacement surgery - Knee replacement surgery postponed due to cellulitis - Painful swelling in the affected knee, with recent worsening - History of lymphedema since 1984  Pain management and medication concerns - Prescribed hydromorphone for pain control - Questions regarding restrictions on concurrent use of hydromorphone and temazepam   Musculoskeletal injury - Painful toe injury sustained during recent move - Toenail removal by podiatry - Requires frequent soaking for symptom relief      Physical Exam   Physical Exam Vitals reviewed.  Constitutional:      General: She is not in acute distress.    Appearance: Normal appearance.  HENT:     Head: Normocephalic and atraumatic.  Cardiovascular:     Rate and Rhythm: Normal rate and regular rhythm.     Pulses: Normal pulses.     Heart sounds: Normal heart sounds. No murmur heard.    No friction rub. No gallop.  Pulmonary:      Effort: Pulmonary effort is normal. No respiratory distress.     Breath sounds: Normal breath sounds. No wheezing.  Musculoskeletal:     Right lower leg: Edema present.     Left lower leg: Edema present.  Skin:    General: Skin is dry.  Neurological:     Mental Status: She is alert and oriented to person, place, and time.  Psychiatric:        Mood and Affect: Mood normal.        Behavior: Behavior normal.        Thought Content: Thought content normal.        Judgment: Judgment normal.    Assessment & Plan   Problem List Items Addressed This Visit       Cardiovascular and Mediastinum   Hypertension   Blood pressure significantly elevated, likely due to anxiety and lifestyle changes. Fluctuates, not consistently controlled. - Increase propranolol  to 80 mg since blood pressure remains high after recheck. - Continue lisinopril  20 mg daily. - Recheck blood pressure at next visit.      Relevant Medications   furosemide (LASIX) 20 MG tablet     Musculoskeletal and Integument   Onychodystrophy   Healing from toenail removal despite injury sustained during move. Advised to soak and keep weight off. - Continue soaking toe and keeping weight off. - Follow up next week for toe evaluation.         Other   Anxiety - Primary   Increased anxiety due to recent move and temazepam  discontinuation. May contribute to elevated blood pressure. -  Monitor anxiety levels and consider further management if symptoms persist. - Managed by psychiatry.      Edema of both lower extremities   Chronic lymphedema with recent exacerbation, painful swelling in both legs, especially left. May contribute to elevated blood pressure. - Prescribe furosemide 40 mg, two tablets daily for 3 days, then one tablet daily for 4 days. - Recheck metabolic panel in one week to monitor kidney function and potassium levels. - Advise taking furosemide in the morning to avoid nocturia.      Relevant Orders   Basic  Metabolic Panel (BMET)   Insomnia   Transitioning from temazepam  to Dayvigo  10 mg nightly. Experiencing vivid dreams during tapering.  - Start Dayvigo  at bedtime, one pill nightly. - Monitor response to Dayvigo  and adjust timing as needed.      Follow up   Return in about 1 week (around 09/18/2024) for swelling/insomnia follow up. __________________________________ Zada FREDRIK Palin, DNP, APRN, FNP-BC Primary Care and Sports Medicine [facility]

## 2024-09-12 ENCOUNTER — Telehealth: Payer: Self-pay

## 2024-09-12 ENCOUNTER — Encounter: Payer: Self-pay | Admitting: Medical-Surgical

## 2024-09-12 NOTE — Telephone Encounter (Signed)
 Copied from CRM [id number]. Topic: Clinical - Prescription Issue >> Sep 12, 2024  9:12 AM Charlet HERO wrote: Reason for CRM: Patient is calling about Dayvigo  she is stating that it did not put her to sleep and gave her a massive headache, she is stating that it is not working for her please call her back she is going to take the tamazpan that she has left tonight. [phone]

## 2024-09-12 NOTE — Assessment & Plan Note (Signed)
 Transitioning from temazepam  to Dayvigo  10 mg nightly. Experiencing vivid dreams during tapering.  - Start Dayvigo  at bedtime, one pill nightly. - Monitor response to Dayvigo  and adjust timing as needed.

## 2024-09-12 NOTE — Assessment & Plan Note (Signed)
 Increased anxiety due to recent move and temazepam  discontinuation. May contribute to elevated blood pressure. - Monitor anxiety levels and consider further management if symptoms persist. - Managed by psychiatry.

## 2024-09-12 NOTE — Assessment & Plan Note (Signed)
 Healing from toenail removal despite injury sustained during move. Advised to soak and keep weight off. - Continue soaking toe and keeping weight off. - Follow up next week for toe evaluation.

## 2024-09-12 NOTE — Assessment & Plan Note (Signed)
 Blood pressure significantly elevated, likely due to anxiety and lifestyle changes. Fluctuates, not consistently controlled. - Increase propranolol  to 80 mg since blood pressure remains high after recheck. - Continue lisinopril  20 mg daily. - Recheck blood pressure at next visit.

## 2024-09-12 NOTE — Assessment & Plan Note (Signed)
 Chronic lymphedema with recent exacerbation, painful swelling in both legs, especially left. May contribute to elevated blood pressure. - Prescribe furosemide 40 mg, two tablets daily for 3 days, then one tablet daily for 4 days. - Recheck metabolic panel in one week to monitor kidney function and potassium levels. - Advise taking furosemide in the morning to avoid nocturia.

## 2024-09-15 ENCOUNTER — Other Ambulatory Visit (HOSPITAL_COMMUNITY): Payer: Self-pay

## 2024-09-15 MED ORDER — BELSOMRA 20 MG PO TABS: 20.0000 mg | ORAL_TABLET | Freq: Every evening | ORAL | 5 refills | Status: DC | PRN

## 2024-09-15 NOTE — Telephone Encounter (Signed)
 Patient will D/C Davigo - she  will take the bottle with her when she goes to pick up the Belsomra( forwarding to our prior authorization team to be working on the PA for [employer] as patient needs the medication asap - not sleeping currently)   Pateint also wanted to let Zada Palin, NP know - she does have upcoming appt on Oct 30th with Joy.  She states that today is the 3rd day of 2 pill dosing of lasix - she is urinating more than she was previously but does not feel that it is quite the amount Joy had wanted her to be urinating - however the swelling In her feet is somewhat improved. Tomorrow she starts one tablet of furosemide daily.

## 2024-09-15 NOTE — Telephone Encounter (Signed)
 Per patient states she would like to go ahead and try to change to Belsomra.  She states the headache was too severe to keep taking the medication ( states she felt like her head was erupting form her body )

## 2024-09-15 NOTE — Telephone Encounter (Signed)
 Discontinue Dayvigo .  Keep the medication and take to the pharmacy as it is controlled and she will likely need to return it to the pharmacy in order to pick up a new controlled prescription.  I sent Belsomra to the pharmacy for her.  This is showing on our end as a drug exclusion for her insurance plan but hopefully we will be able to get it pushed through. Thanks, Zada

## 2024-09-16 ENCOUNTER — Telehealth: Payer: Self-pay | Admitting: Medical-Surgical

## 2024-09-16 ENCOUNTER — Other Ambulatory Visit (HOSPITAL_COMMUNITY): Payer: Self-pay

## 2024-09-16 ENCOUNTER — Telehealth: Payer: Self-pay

## 2024-09-16 NOTE — Telephone Encounter (Signed)
 Copied from CRM #[id number]. Topic: Clinical - Prescription Issue >> Sep 16, 2024 11:32 AM Shanda MATSU wrote: Reason for CRM: Patient is calling to advise that med, Belsomra 20mg , should have been sent to [facility] [place], [place] - [address] [place] [address] Phone: [phone] Fax: [phone]

## 2024-09-16 NOTE — Telephone Encounter (Signed)
 Pharmacy Patient Advocate Encounter   Received notification from Pt Calls Messages that prior authorization for Belsomra 20mg  is required/requested.   Insurance verification completed.   The patient is insured through [facility].   Per test claim: PA required; PA submitted to above mentioned insurance via Latent Key/confirmation #/EOC [id number] Status is pending

## 2024-09-16 NOTE — Telephone Encounter (Signed)
 Pharmacy Patient Advocate Encounter  Received notification from [facility] that Prior Authorization for Belsomra 20mg  tabs has been DENIED.  Full denial letter will be uploaded to the media tab. See denial reason below.   PA #/Case ID/Reference #: [id number]

## 2024-09-16 NOTE — Telephone Encounter (Signed)
 Sent patient Mychart message that the Belsomra was sent yesterday to [facility] pharmacy  and that we are currently awaiting the results of the prior authorization

## 2024-09-17 ENCOUNTER — Telehealth: Payer: Self-pay

## 2024-09-17 ENCOUNTER — Other Ambulatory Visit (HOSPITAL_COMMUNITY): Payer: Self-pay

## 2024-09-17 NOTE — Telephone Encounter (Signed)
 Did you want to do the peer to peer for the Belsomra for the patient ?  # for peer to peer is [phone].

## 2024-09-17 NOTE — Telephone Encounter (Signed)
 Copied from CRM [id number]. Topic: Clinical - Medication Prior Auth >> Sep 17, 2024 10:19 AM Tinnie BROCKS wrote: Reason for CRM: Kayla from [facility] calling to let us  know that the Belsomra is non formulary on pts plan, but Dayvigo  is covered with no PA needed.  If appeal is requested for Belsomra, # for peer to peer is [phone].

## 2024-09-17 NOTE — Telephone Encounter (Signed)
 Message sent to provider of denial of Belsomra in a separate message . With peer to peer contact information .

## 2024-09-18 ENCOUNTER — Ambulatory Visit: Admitting: Professional

## 2024-09-18 ENCOUNTER — Encounter: Payer: Self-pay | Admitting: Professional

## 2024-09-18 DIAGNOSIS — F411 Generalized anxiety disorder: Secondary | ICD-10-CM | POA: Diagnosis not present

## 2024-09-18 DIAGNOSIS — F4321 Adjustment disorder with depressed mood: Secondary | ICD-10-CM

## 2024-09-18 DIAGNOSIS — F331 Major depressive disorder, recurrent, moderate: Secondary | ICD-10-CM | POA: Diagnosis not present

## 2024-09-18 NOTE — Progress Notes (Signed)
 [facility] Counselor/Therapist Progress Note  Patient ID: Heather Thompson, MRN: [id number],    Date: 09/18/2024  Time Spent:  55 minutes [id number]  Treatment Type: Individual Therapy  Risk Assessment: Danger to Self:  No Self-injurious Behavior: No Danger to Others: No  Subjective: This session was held via video teletherapy. The patient consented to video teletherapy and was located in her home for this session. She is aware it is the responsibility of the patient to secure confidentiality on her end of the session. The provider was in a private office for the duration of this session.    The patient arrived on time for her Caregility appointment.  Issues addressed: 1-completed days 3-5 day of Self-Love book a-be kind not self-judging -pt recognizes feeling harsh toward herself due to overeating b-pt admits being critical of herself but treats others with kindness and not herself c-discussed strategies for being more positive d-pt tried strategy that she enjoyed a bag of chocolate lasted 3 days e-meditation -emotional need not being met -meditate on your intentions -pt feeling lonely particularly at night when by herself or having a friend that will listen and truly understand without judgment or turning it into their issue -pt can be with a group of people and still feel lonely inside because no one is connected to her story or because she is not taking time to contribute -pt doesn't have that person to connect with -finds success with guided imagery and can really get into a relaxed place 2-physical a-importance of eating healthy choices first 3-speaking engagement at church a-pt shared her presentation -talked about her faith journey through [name]'s death, her relocation, and discovering a new church family  Treatment Plan Problems: Financial Stress, Low Self-Esteem, Attention Deficit Disorder (ADD) - Adult Symptoms: Unable to concentrate or pay attention  to things of low interest, even when those things are important to his/her life. Restless and fidgety; unable to be sedentary for more than a short time. Disorganized in most areas of his/her life. Starts many projects but rarely finishes any. Chronic low self-esteem. Tendency toward addictive behaviors. Indebtedness and overdue bills that exceed ability to meet monthly payments. A long-term lack of discipline in money management that has led to excessive indebtedness. A pattern of impulsive spending that does not consider the eventual financial consequences. Inability to accept compliments. Makes self-disparaging remarks; sees self as unattractive, worthless, a loser, a burden, unimportant; takes blame easily. Difficulty in saying no to others; assumes not being liked by others. Fear of rejection by others, especially peer group. Lack of any goals for life and setting of inappropriately low goals for self. Inability to identify positive characteristics of self. Anxious and uncomfortable in social situations. Goals: Reduce impulsive actions while increasing concentration and focus on low-interest activities. Minimize ADD behavioral interference in daily life. Sustain attention and concentration for consistently longer periods of time. Achieve an inner strength to control personal impulses, cravings, and desires that directly or indirectly increase debt irresponsibly. Resolve financial crisis with a path to eliminate debt. Revise spending patterns to not exceed income. Gain a new sense of self-worth in which the substance of one's value is not attached to the capacity to do things or own things that cost money. Understand personal desires, insecurities, and anxieties that make overspending possible. Elevate self-esteem. Develop a consistent, positive self-image. Demonstrate improved self-esteem through more pride in appearance, more assertiveness, greater eye contact, and identification of  positive traits in self-talk messages. Establish an inward sense of self-worth, confidence,  and competence. Interact socially without undue distress or disability. Objectives target date for all objectives is 03/25/2025: Identify the current specific ADD behaviors that cause the most difficulty. List the negative consequences of the ADD problematic behavior. Learn and implement organization and planning skills. Acknowledge procrastination and the need to reduce it. Learn and implement skills to reduce procrastination. Learn and implement skills to reduce the disruptive influence of distractibility. Combine skills learned in therapy into a new daily approach to managing ADHD. Use cognitive and behavioral strategies to control the impulse to make unnecessary and unaffordable purchases. Report instances of successful control over impulse to spend on unnecessary expenses. Increase insight into the historical and current sources of low self-esteem. Decrease the frequency of negative self-descriptive statements and increase frequency of positive self-descriptive statements. Articulate a plan to be proactive in trying to get identified needs met. Form realistic, appropriate, and attainable goals for self in all areas of life. Take verbal responsibility for accomplishments without discounting. Identify and replace negative self-talk messages used to reinforce low self-esteem. Objectives target date for all objectives is 03/13/2025: Identify the specific anxiety symptoms that are personally most disturbing or most contributing to impaired functioning. Learn and practice thought and behavioral control methods to minimize and control anxiety symptoms once they have begun. Identify specific thoughts that precipitate anxiety symptoms. Replace anxiety-producing thoughts with constructive thoughts. Verbalize an understanding of the general physical and cognitive manifestations of the causes for  anxiety. Identify and clarify the patterns of anxiety precipitants and consequences. Identify whether the symptoms of depression seem to be primarily related to interpersonal relationships, stressful life events or circumstances, thoughts/beliefs, or behaviors. Verbalize an understanding of the general physical and psychological effects of depression. Replace depression-promoting thoughts with mood-elevating thoughts. Keep a daily record of mood rating from 1 to 10, noting associated behaviors, activities, events, people, and thoughts. Identify specific thoughts that precipitate depressive moods. Monitor and report depressive symptoms, completing self-report assessment on a periodic basis. Describe and evaluate significant past and current interpersonal relationships. Tell memories of the deceased person, starting with positive memories. Consider possible ways of increasing involvement with others. Accept the reality of loss and tolerate the pain of intense grief. Engage in events and activities that increase level of social involvements. Identify and monitor specific pain triggers. Learn and implement somatic skills such as relaxation and/or biofeedback to reduce pain level. Learn mental coping skills and implement with somatic skills for managing acute pain. Interventions: Reinforce the client's belief system and/or refer to a spiritual leader who can support the client's faith. Explore and problem-solve with the client difficulties, fears, or barriers related to increasing social involvement. Point out to the client that engagement with others is associated with decreased feelings of loneliness, grief, depression, and hopelessness. Provide encouragement and support to the client for increased engagement in contact with others. Clarify with the client that expression of negative thoughts or feelings about the deceased is not disloyal. Assist the client in remembering the loved one in  realistic ways (i.e., includes attractions, positive traits, negative traits, conflicts, ambivalence, dependency, etc.), a memory that is neither idealized nor demonized, so that the client's relationship with the deceased person can be appraised realistically (or assign Dear _____: A Letter to a Lost Loved One in the Adult Psychotherapy Homework Planner, 2nd ed. by Jenniffer). Assign the client to read about progressive muscle relaxation and other calming strategies in relevant books or treatment manuals (e.g., Progressive Relaxation Training by Thornell armin Collier). Assign a homework  exercise in which the client implements somatic pain management skills and records the result; review and process during the treatment session. Teach the client distraction techniques (e.g., pleasant imagery, counting techniques, alternative focal points) and how to use them for relaxation skills for the management of acute episodes of pain. Ask the client to create a list of activities that are pleasurable to him/her (or assign Identify and Schedule Pleasant Activities in the Adult Psychotherapy Homework Planner, 2nd ed. by Jenniffer); process the list, developing a plan of increasing the frequency of engaging in the selected pleasurable activities. Teach the client problem-solving skills to apply to removal of obstacles to implementing new skills; role-play implementation of these skills to obstacles. Assist the client in identifying the most effective personal stress management techniques (e.g., prayer, walking, baking, telephoning a friend), and encourage daily scheduling of these activities (or assign Past Successful Anxiety Coping in the Adult Psychotherapy Homework Planner, 2nd ed. by Jenniffer). Teach the client the concept of finding a good match between the individual's capacities and the demands of the physical environment. If the physical environment is too demanding for the individual's capacities (e.g., frail  older adult taking care of a large house), the individual can become overwhelmed and may need to move from large home to smaller accommodations or residence for older adults). While reviewing the client's anxiety symptom chart, help him/her recognize patterns associated with anxiety symptoms: sort out precipitants from consequences, identify the most intense or frequent precipitants, and identify the consequences that help to perpetuate maladaptive patterns. Assist the client in identifying his/her current attempts to reducing anxiety (e.g., constantly telephoning family or physician, making unneeded doctor appointments or going to the emergency room), their apparent positive consequences (e.g., feeling better, getting attention), but longer-term negative consequences (e.g., physician won't return calls, friends avoid the client because of expressions of worry). Teach the client that his/her mood can be improved by increasing pleasant events and decreasing unpleasant events. Encourage the client to identify pleasant events that are desirable, but are not currently a part of his/her daily routine (see Identify and Schedule Pleasant Activities in the Adult Psychotherapy Homework Planner, 2nd ed. by Jenniffer). Develop a one-week daily schedule with the client that increases pleasant events and decreases unpleasant events, making sure to have at least one pleasant event every day. Help the client to understand his/her strengths and weaknesses in problem-solving, to view problems as challenges and not personal deficits, and to identify and define current problem(s) that will be the focus of treatment. Help the client to identify realistic goals to address problem(s) of concern and identify major obstacles in achieving goals. Encourage the client to implement chosen solutions to current life problem(s), self-monitor and evaluate solution implementation, and reward self for efforts. Explore the relationship of  negative self-talk (e.g., I'm just a burden to everyone; Everyone would be better off if I were dead) and distorted beliefs (e.g., There's no one like me living here; I'd rather never leave my room than have anyone see me in a wheelchair) to depressed mood (or assign Negative Thoughts Trigger Negative Feelings in the Adult Psychotherapy Homework Planner, 2nd ed. by Jenniffer; or Daily Record of Dysfunctional Thoughts in Cognitive Therapy of Depression by Almarie Candida Gentry and Shona). Encourage the client to distinguish between all-or-nothing thinking (e.g., Life in a nursing home can't be worth living) and genuine nuanced reflection on life's meaning and purpose (e.g., What's giving my life meaning at this stage?); confront the former, encourage the latter.  Diagnosis:Major  depressive disorder, recurrent episode, moderate (HCC)  Generalized anxiety disorder  Grief  Plan:  -meet again on Thursday, September 25, 2024 at 11am in person

## 2024-09-19 ENCOUNTER — Ambulatory Visit: Admitting: Medical-Surgical

## 2024-09-19 ENCOUNTER — Encounter: Payer: Self-pay | Admitting: Podiatry

## 2024-09-19 ENCOUNTER — Encounter: Payer: Self-pay | Admitting: Medical-Surgical

## 2024-09-19 ENCOUNTER — Ambulatory Visit (INDEPENDENT_AMBULATORY_CARE_PROVIDER_SITE_OTHER): Admitting: Podiatry

## 2024-09-19 VITALS — BP 120/75 | HR 77 | Resp 20 | Ht 64.0 in | Wt 197.0 lb

## 2024-09-19 DIAGNOSIS — R600 Localized edema: Secondary | ICD-10-CM | POA: Diagnosis not present

## 2024-09-19 DIAGNOSIS — I10 Essential (primary) hypertension: Secondary | ICD-10-CM | POA: Diagnosis not present

## 2024-09-19 DIAGNOSIS — G8929 Other chronic pain: Secondary | ICD-10-CM

## 2024-09-19 DIAGNOSIS — G4700 Insomnia, unspecified: Secondary | ICD-10-CM

## 2024-09-19 DIAGNOSIS — M549 Dorsalgia, unspecified: Secondary | ICD-10-CM

## 2024-09-19 DIAGNOSIS — F418 Other specified anxiety disorders: Secondary | ICD-10-CM

## 2024-09-19 DIAGNOSIS — L03032 Cellulitis of left toe: Secondary | ICD-10-CM

## 2024-09-19 DIAGNOSIS — F411 Generalized anxiety disorder: Secondary | ICD-10-CM

## 2024-09-19 MED ORDER — FLUOXETINE HCL 10 MG PO CAPS: 10.0000 mg | ORAL_CAPSULE | Freq: Every day | ORAL | 0 refills | Status: DC

## 2024-09-19 MED ORDER — GABAPENTIN 300 MG PO CAPS: 300.0000 mg | ORAL_CAPSULE | Freq: Three times a day (TID) | ORAL | 0 refills | Status: DC

## 2024-09-19 NOTE — Progress Notes (Signed)
 Established Patient Office Visit  Subjective   Patient ID: Heather Thompson, female    DOB: Apr 05, 1951  Age: 73 y.o. MRN: [id number]  Chief Complaint  Patient presents with   Foot Swelling    Left   Insomnia    HPI  73 year old female presents to the office for a 1 month follow up on:  Hypertension: Patient seen 1 month ago for follow up on chronic diseases. Medication adjustments include an increase in the propranolol  80 mg and continuation of lisinopril  20 mg daily. Patient states that she is doing well with her medication. She has not been checking BP at home. Denies chest pain, shortness of breath, heart palpitations, or dizziness..  Insomnia: Patient recently started on Dayvigo  10 mg nightly on 10/16. Patient states that she only took 1 tablet and stopped due to causing a headache. Patient has continued to take the temazepam  nightly with some relief. Patient would like if it is okay to know if it is okay to continue the temazepam  at 7.5 mg with the increased dose of 4 mg hydromorphone which her pain specialist is considering increasing to pending decision on the continuation of the temazepam .   Edema of both lower extremities: Patient with chronic lymphedema. Previously seen on 10/16. She was started on furosemide 40 mg two tablets daily for 3 days and then one tablet daily x 4 days. Plan was to recheck metabolic panel to monitor kidney function and potassium levels. Will check these levels today.  Patient also states that she only has 3 fluoxetine  left and does not have another appointment to follow up for a few months with [facility]. Would like to see if she could get this refilled until she has her appointment  Also has questions about continuation of gabapentin  before surgery. She is no longer seeing the provider that started her on the gabapentin  because she was referred to the surgeon. She would like to know if this needs to be continued. She has 1 tablet left. Spinal fusion surgery  is scheduled for Surgery is pending 11/17.   Review of Systems  Constitutional: Negative.   HENT: Negative.    Eyes: Negative.   Respiratory: Negative.    Cardiovascular: Negative.   Gastrointestinal: Negative.   Genitourinary: Negative.   Musculoskeletal: Negative.   Skin: Negative.   Neurological: Negative.   Endo/Heme/Allergies: Negative.   Psychiatric/Behavioral: Negative.        Objective:     BP 120/75 (BP Location: Right Arm, Cuff Size: Normal)   Pulse 77   Resp 20   Ht 5' 4 (1.626 m)   Wt 89.4 kg   SpO2 96%   BMI 33.81 kg/m  BP Readings from Last 3 Encounters:  09/19/24 120/75  09/11/24 (!) 186/70  09/05/24 (!) 193/84      Physical Exam Vitals and nursing note reviewed.  Constitutional:      General: She is not in acute distress.    Appearance: Normal appearance.  Cardiovascular:     Rate and Rhythm: Normal rate and regular rhythm.     Pulses: Normal pulses.     Heart sounds: Normal heart sounds, S1 normal and S2 normal.  Pulmonary:     Effort: Pulmonary effort is normal.     Breath sounds: Normal breath sounds.  Musculoskeletal:     Right lower leg: 2+ Edema present.     Left lower leg: 1+ Edema present.  Skin:    General: Skin is warm and dry.  Neurological:  General: No focal deficit present.     Mental Status: She is alert and oriented to person, place, and time.  Psychiatric:        Mood and Affect: Mood normal.        Behavior: Behavior normal.        Thought Content: Thought content normal.        Judgment: Judgment normal.      No results found for any visits on 09/19/24.  Last metabolic panel Lab Results  Component Value Date   GLUCOSE 101 (H) 08/14/2023   NA 136 08/14/2023   K 4.8 08/14/2023   CL 95 (L) 08/14/2023   CO2 26 08/14/2023   BUN 17 08/14/2023   CREATININE 0.68 08/14/2023   EGFR 92 08/14/2023   CALCIUM 9.6 08/14/2023   PROT 6.9 08/14/2023   ALBUMIN 4.2 08/14/2023   LABGLOB 2.7 08/14/2023   AGRATIO 2.0  03/27/2023   BILITOT 0.4 08/14/2023   ALKPHOS 107 08/14/2023   AST 30 08/14/2023   ALT 29 08/14/2023   ANIONGAP 7 09/07/2021      The 10-year ASCVD risk score (Arnett DK, et al., 2019) is: 16.9%    Assessment & Plan:   1. Edema of both lower extremities (Primary) -Will plan to recheck metabolic panel on Monday to recheck kidney function and electrolytes -If labs are stable will plan to continue lasix at 20 mg  2. Primary hypertension -Continue taking propranolol  80 mg and lisinopril  20 mg daily -Initial BP was 152/76 and recheck was 120/75  3. Insomnia, unspecified type -Discontinue temazepam  and follow up with pain management specialists and psychiatrists  4. Chronic bilateral back pain, unspecified back location [M54.9, G89.29] -Refill for Gabapentin  sent to the pharmacy -Discuss continued use with surgeon for post surgical pain relief  5. Depression with anxiety -Refill sent for fluoxetine  10 mg daily  Follow up as needed  Derrek JINNY Freund, NP Student

## 2024-09-19 NOTE — Progress Notes (Signed)
 Subjective:  Patient ID: Heather Thompson, female    DOB: 07-Jun-1951,   MRN: [id number]  Chief Complaint  Patient presents with   Nail Problem    It's much better.  It's still a tiny swollen.  I still been soaking it and putting the ointment on it.  It's a tiny bit red.    73 y.o. female presents for follow-up of left hallux nail removal. Relates doing well ands soaking as instructed.    PCP:  Willo Mini, NP    . Denies any other pedal complaints. Denies n/v/f/c.   Past Medical History:  Diagnosis Date   Abnormality of gait 04/23/2019   Acquired hypothyroidism 07/29/2021   Allergic rhinitis due to animal hair and dander 12/02/2015   Formatting of this note might be different from the original.  [facility]  Seen by Dr. Leita 11/11/15     Plan- Received allergy shots today. Follow up 1 year.     Allergy    Anemia    Anemia    Anxiety    Arthritis of scaphoid-trapezium-trapezoid joint of right hand 09/10/2023   B12 deficiency    Back pain    Chronic fatigue 07/29/2021   Chronic fatigue syndrome    Chronic throat clearing 03/14/2023   Clotting disorder    Constipation    Depression with anxiety 04/07/2009   Formatting of this note might be different from the original.  well controlled on current meds, follows with psychiatrist in [place]. Had a   h/o severe depression 5 years ago, and had ECT.     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  B12 deficiency   Iron deficiency     Herniated lumbar intervertebral disc 11/12/2020   High cholesterol    History of blood clots    History of cervical spinal surgery 01/01/2018   Formatting of this note might be  different from the original.  Fusion C6 and C7 per 2018 MRI report.     History of left hip replacement 01/14/2018   History of Roux-en-Y gastric bypass    Hoarseness 03/14/2023   Hyperlipidemia 08/19/2010   Hypertension    Hyponatremia 01/17/2018   Hypothyroidism    Hypothyroidism    IBS (irritable bowel syndrome) 04/06/2021   Impaired fasting glucose 08/19/2010   Incontinence 10/30/2013   Formatting of this note might be different from the original. Formatting of this note might be different from the original. Urinary Incontinence     Infertility, female    Insomnia 04/07/2009   Formatting of this note might be different from the original.  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  Followed by [facility]  Seen by Dr. Linnette 03/01/15     S/p changing thoracic spinal cord stimulator generator 08/18/14.     Lumbar spine films show spinal cord generator in left flank area. 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  Follows with [facility]ology in [facility] of this note might be different from the original.  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  Getting iron infusion via port at hematalogy/oncology center at [facility]   Was recently hospitalized in [facility] for severe anemia     Formatting of this note might be different from the original.  on OTC b12 currently     Vitamin D  deficiency     Objective:  Physical Exam: Vascular: DP/PT pulses 2/4 bilateral. CFT <3 seconds. Feet cold to touch. Absent hair growth on digits. Edema noted to bilateral lower extremities. Xerosis noted bilaterally.  Skin. No lacerations or abrasions bilateral feet. Nails 1-5 bilateral  are thickened discolored and elongated with subungual debris. Left hallux  nail healing well.  Musculoskeletal: MMT 5/5 bilateral lower extremities in DF, PF, Inversion and Eversion. Deceased ROM in DF of ankle joint.  Neurological: Sensation intact to light touch. Protective sensation diminished bilateral.    Assessment:   1. Paronychia of great toe of left foot      Plan:  Patient was evaluated and treated and all questions answered. Toe was evaluated and appears to be healing well.  May discontinue soaks and neosporin.  Patient to follow-up as needed.     Asberry Failing, DPM

## 2024-09-21 NOTE — Progress Notes (Signed)
 Established care with a new psychiatrist but has only had one appointment. Recommended reaching out to psychiatry to see if they have recommendations to help with insomnia since she has a history of intolerances to multiple medications. Patient verbalized understanding and plans to reach out to them.   Medical screening examination/treatment was performed by qualified clinical staff member and as supervising provider I was immediately available for consultation/collaboration. I have reviewed documentation and agree with assessment and plan.  Zada FREDRIK Palin, DNP, APRN, FNP-BC [facility]

## 2024-09-24 ENCOUNTER — Ambulatory Visit: Payer: Self-pay | Admitting: Medical-Surgical

## 2024-09-24 LAB — BASIC METABOLIC PANEL WITH GFR
BUN/Creatinine Ratio: 28 (ref 12–28)
BUN: 22 mg/dL (ref 8–27)
CO2: 24 mmol/L (ref 20–29)
Calcium: 9.7 mg/dL (ref 8.7–10.3)
Chloride: 94 mmol/L — ABNORMAL LOW (ref 96–106)
Creatinine, Ser: 0.8 mg/dL (ref 0.57–1.00)
Glucose: 111 mg/dL — ABNORMAL HIGH (ref 70–99)
Potassium: 5.2 mmol/L (ref 3.5–5.2)
Sodium: 134 mmol/L (ref 134–144)
eGFR: 77 mL/min/1.73 (ref >59)

## 2024-09-24 MED ORDER — FUROSEMIDE 20 MG PO TABS: 20.0000 mg | ORAL_TABLET | Freq: Every day | ORAL | 0 refills | Status: DC

## 2024-09-25 ENCOUNTER — Encounter: Payer: Self-pay | Admitting: Professional

## 2024-09-25 ENCOUNTER — Ambulatory Visit (INDEPENDENT_AMBULATORY_CARE_PROVIDER_SITE_OTHER): Admitting: Professional

## 2024-09-25 DIAGNOSIS — F411 Generalized anxiety disorder: Secondary | ICD-10-CM | POA: Diagnosis not present

## 2024-09-25 DIAGNOSIS — F331 Major depressive disorder, recurrent, moderate: Secondary | ICD-10-CM | POA: Diagnosis not present

## 2024-09-25 NOTE — Progress Notes (Signed)
 [facility] Counselor/Therapist Progress Note  Patient ID: Heather Thompson, MRN: [id number],    Date: 09/25/2024  Time Spent:  52 minutes 1105-1157am  Treatment Type: Individual Therapy  Risk Assessment: Danger to Self:  No Self-injurious Behavior: No Danger to Others: No  Subjective: This session was held via video teletherapy. The patient consented to video teletherapy and was located in her home for this session. She is aware it is the responsibility of the patient to secure confidentiality on her end of the session. The provider was in a private office for the duration of this session.    The patient arrived on time for her Caregility appointment.  Issues addressed: 1-completed days 3-5 day of Self-Love book a-be kind not self-judging -pt recognizes feeling harsh toward herself due to overeating b-pt admits being critical of herself but treats others with kindness and not herself c-discussed strategies for being more positive d-pt tried strategy that she enjoyed a bag of chocolate lasted 3 days e-meditation -emotional need not being met -meditate on your intentions -pt feeling lonely particularly at night when by herself or having a friend that will listen and truly understand without judgment or turning it into their issue -pt can be with a group of people and still feel lonely inside because no one is connected to her story or because she is not taking time to contribute -pt doesn't have that person to connect with -finds success with guided imagery and can really get into a relaxed place 2-physical a-importance of eating healthy choices first 3-speaking engagement at church a-pt shared her presentation -talked about her faith journey through [name]'s death, her relocation, and discovering a new church family  Treatment Plan Problems: Financial Stress, Low Self-Esteem, Attention Deficit Disorder (ADD) - Adult Symptoms: Unable to concentrate or pay attention  to things of low interest, even when those things are important to his/her life. Restless and fidgety; unable to be sedentary for more than a short time. Disorganized in most areas of his/her life. Starts many projects but rarely finishes any. Chronic low self-esteem. Tendency toward addictive behaviors. Indebtedness and overdue bills that exceed ability to meet monthly payments. A long-term lack of discipline in money management that has led to excessive indebtedness. A pattern of impulsive spending that does not consider the eventual financial consequences. Inability to accept compliments. Makes self-disparaging remarks; sees self as unattractive, worthless, a loser, a burden, unimportant; takes blame easily. Difficulty in saying no to others; assumes not being liked by others. Fear of rejection by others, especially peer group. Lack of any goals for life and setting of inappropriately low goals for self. Inability to identify positive characteristics of self. Anxious and uncomfortable in social situations. Goals: Reduce impulsive actions while increasing concentration and focus on low-interest activities. Minimize ADD behavioral interference in daily life. Sustain attention and concentration for consistently longer periods of time. Achieve an inner strength to control personal impulses, cravings, and desires that directly or indirectly increase debt irresponsibly. Resolve financial crisis with a path to eliminate debt. Revise spending patterns to not exceed income. Gain a new sense of self-worth in which the substance of one's value is not attached to the capacity to do things or own things that cost money. Understand personal desires, insecurities, and anxieties that make overspending possible. Elevate self-esteem. Develop a consistent, positive self-image. Demonstrate improved self-esteem through more pride in appearance, more assertiveness, greater eye contact, and identification of  positive traits in self-talk messages. Establish an inward sense of self-worth, confidence,  and competence. Interact socially without undue distress or disability. Objectives target date for all objectives is 03/25/2025: Identify the current specific ADD behaviors that cause the most difficulty. List the negative consequences of the ADD problematic behavior. Learn and implement organization and planning skills. Acknowledge procrastination and the need to reduce it. Learn and implement skills to reduce procrastination. Learn and implement skills to reduce the disruptive influence of distractibility. Combine skills learned in therapy into a new daily approach to managing ADHD. Use cognitive and behavioral strategies to control the impulse to make unnecessary and unaffordable purchases. Report instances of successful control over impulse to spend on unnecessary expenses. Increase insight into the historical and current sources of low self-esteem. Decrease the frequency of negative self-descriptive statements and increase frequency of positive self-descriptive statements. Articulate a plan to be proactive in trying to get identified needs met. Form realistic, appropriate, and attainable goals for self in all areas of life. Take verbal responsibility for accomplishments without discounting. Identify and replace negative self-talk messages used to reinforce low self-esteem. Objectives target date for all objectives is 03/13/2025: Identify the specific anxiety symptoms that are personally most disturbing or most contributing to impaired functioning. Learn and practice thought and behavioral control methods to minimize and control anxiety symptoms once they have begun. Identify specific thoughts that precipitate anxiety symptoms. Replace anxiety-producing thoughts with constructive thoughts. Verbalize an understanding of the general physical and cognitive manifestations of the causes for  anxiety. Identify and clarify the patterns of anxiety precipitants and consequences. Identify whether the symptoms of depression seem to be primarily related to interpersonal relationships, stressful life events or circumstances, thoughts/beliefs, or behaviors. Verbalize an understanding of the general physical and psychological effects of depression. Replace depression-promoting thoughts with mood-elevating thoughts. Keep a daily record of mood rating from 1 to 10, noting associated behaviors, activities, events, people, and thoughts. Identify specific thoughts that precipitate depressive moods. Monitor and report depressive symptoms, completing self-report assessment on a periodic basis. Describe and evaluate significant past and current interpersonal relationships. Tell memories of the deceased person, starting with positive memories. Consider possible ways of increasing involvement with others. Accept the reality of loss and tolerate the pain of intense grief. Engage in events and activities that increase level of social involvements. Identify and monitor specific pain triggers. Learn and implement somatic skills such as relaxation and/or biofeedback to reduce pain level. Learn mental coping skills and implement with somatic skills for managing acute pain. Interventions: Reinforce the client's belief system and/or refer to a spiritual leader who can support the client's faith. Explore and problem-solve with the client difficulties, fears, or barriers related to increasing social involvement. Point out to the client that engagement with others is associated with decreased feelings of loneliness, grief, depression, and hopelessness. Provide encouragement and support to the client for increased engagement in contact with others. Clarify with the client that expression of negative thoughts or feelings about the deceased is not disloyal. Assist the client in remembering the loved one in  realistic ways (i.e., includes attractions, positive traits, negative traits, conflicts, ambivalence, dependency, etc.), a memory that is neither idealized nor demonized, so that the client's relationship with the deceased person can be appraised realistically (or assign Dear _____: A Letter to a Lost Loved One in the Adult Psychotherapy Homework Planner, 2nd ed. by Jenniffer). Assign the client to read about progressive muscle relaxation and other calming strategies in relevant books or treatment manuals (e.g., Progressive Relaxation Training by Thornell armin Collier). Assign a homework  exercise in which the client implements somatic pain management skills and records the result; review and process during the treatment session. Teach the client distraction techniques (e.g., pleasant imagery, counting techniques, alternative focal points) and how to use them for relaxation skills for the management of acute episodes of pain. Ask the client to create a list of activities that are pleasurable to him/her (or assign Identify and Schedule Pleasant Activities in the Adult Psychotherapy Homework Planner, 2nd ed. by Jenniffer); process the list, developing a plan of increasing the frequency of engaging in the selected pleasurable activities. Teach the client problem-solving skills to apply to removal of obstacles to implementing new skills; role-play implementation of these skills to obstacles. Assist the client in identifying the most effective personal stress management techniques (e.g., prayer, walking, baking, telephoning a friend), and encourage daily scheduling of these activities (or assign Past Successful Anxiety Coping in the Adult Psychotherapy Homework Planner, 2nd ed. by Jenniffer). Teach the client the concept of finding a good match between the individual's capacities and the demands of the physical environment. If the physical environment is too demanding for the individual's capacities (e.g., frail  older adult taking care of a large house), the individual can become overwhelmed and may need to move from large home to smaller accommodations or residence for older adults). While reviewing the client's anxiety symptom chart, help him/her recognize patterns associated with anxiety symptoms: sort out precipitants from consequences, identify the most intense or frequent precipitants, and identify the consequences that help to perpetuate maladaptive patterns. Assist the client in identifying his/her current attempts to reducing anxiety (e.g., constantly telephoning family or physician, making unneeded doctor appointments or going to the emergency room), their apparent positive consequences (e.g., feeling better, getting attention), but longer-term negative consequences (e.g., physician won't return calls, friends avoid the client because of expressions of worry). Teach the client that his/her mood can be improved by increasing pleasant events and decreasing unpleasant events. Encourage the client to identify pleasant events that are desirable, but are not currently a part of his/her daily routine (see Identify and Schedule Pleasant Activities in the Adult Psychotherapy Homework Planner, 2nd ed. by Jenniffer). Develop a one-week daily schedule with the client that increases pleasant events and decreases unpleasant events, making sure to have at least one pleasant event every day. Help the client to understand his/her strengths and weaknesses in problem-solving, to view problems as challenges and not personal deficits, and to identify and define current problem(s) that will be the focus of treatment. Help the client to identify realistic goals to address problem(s) of concern and identify major obstacles in achieving goals. Encourage the client to implement chosen solutions to current life problem(s), self-monitor and evaluate solution implementation, and reward self for efforts. Explore the relationship of  negative self-talk (e.g., I'm just a burden to everyone; Everyone would be better off if I were dead) and distorted beliefs (e.g., There's no one like me living here; I'd rather never leave my room than have anyone see me in a wheelchair) to depressed mood (or assign Negative Thoughts Trigger Negative Feelings in the Adult Psychotherapy Homework Planner, 2nd ed. by Jenniffer; or Daily Record of Dysfunctional Thoughts in Cognitive Therapy of Depression by Almarie Candida Gentry and Shona). Encourage the client to distinguish between all-or-nothing thinking (e.g., Life in a nursing home can't be worth living) and genuine nuanced reflection on life's meaning and purpose (e.g., What's giving my life meaning at this stage?); confront the former, encourage the latter.  Diagnosis:Major  depressive disorder, recurrent episode, moderate (HCC)  Generalized anxiety disorder  Plan:  -meet again on Thursday, October 09, 2024 at 11am in person

## 2024-09-26 ENCOUNTER — Encounter (HOSPITAL_COMMUNITY): Payer: Self-pay | Admitting: Psychiatry

## 2024-10-06 ENCOUNTER — Inpatient Hospital Stay

## 2024-10-07 ENCOUNTER — Encounter: Payer: Self-pay | Admitting: Urology

## 2024-10-07 ENCOUNTER — Ambulatory Visit: Admitting: Urology

## 2024-10-07 ENCOUNTER — Ambulatory Visit (HOSPITAL_COMMUNITY): Admitting: Psychiatry

## 2024-10-07 ENCOUNTER — Other Ambulatory Visit: Payer: Self-pay | Admitting: Medical-Surgical

## 2024-10-07 VITALS — BP 150/78 | HR 53 | Ht 64.0 in | Wt 199.0 lb

## 2024-10-07 DIAGNOSIS — N3941 Urge incontinence: Secondary | ICD-10-CM

## 2024-10-07 DIAGNOSIS — R3129 Other microscopic hematuria: Secondary | ICD-10-CM | POA: Diagnosis not present

## 2024-10-07 DIAGNOSIS — R32 Unspecified urinary incontinence: Secondary | ICD-10-CM

## 2024-10-07 DIAGNOSIS — N3281 Overactive bladder: Secondary | ICD-10-CM

## 2024-10-07 LAB — URINALYSIS, ROUTINE W REFLEX MICROSCOPIC
Bilirubin, UA: NEGATIVE
Glucose, UA: NEGATIVE
Ketones, UA: NEGATIVE
Leukocytes,UA: NEGATIVE
Nitrite, UA: NEGATIVE
Protein,UA: NEGATIVE
Specific Gravity, UA: 1.015 (ref 1.005–1.030)
Urobilinogen, Ur: 0.2 mg/dL (ref 0.2–1.0)
pH, UA: 6.5 (ref 5.0–7.5)

## 2024-10-07 LAB — MICROSCOPIC EXAMINATION: Bacteria, UA: NONE SEEN

## 2024-10-07 MED ORDER — SOLIFENACIN SUCCINATE 10 MG PO TABS: 10.0000 mg | ORAL_TABLET | Freq: Every day | ORAL | 3 refills | Status: AC

## 2024-10-07 NOTE — Progress Notes (Signed)
 Assessment: 1. Urge incontinence   2. OAB (overactive bladder)   3. Microscopic hematuria     Plan: Continue bladder diet  Continue solifenacin  10 mg daily.   Return to office in 4-6 weeks for repeat U/A  Chief Complaint:  Chief Complaint  Patient presents with   Urinary Incontinence    History of Present Illness:  Heather Thompson is a 73 y.o. female who is seen for further evaluation of OAB and urge incontinence.   She has a long history of overactive bladder symptoms with some associated incontinence.  She was previously treated at the [facility] in 2018 with oxybutynin  and Myrbetriq.  At her visit in 10/24, she reported an increase in her frequency, urgency, and urge incontinence for several months.  She was previously on oxybutynin  which was not working well in controlling her symptoms.  She was changed to Solifenacin  5 mg daily and noted a significant improvement in her symptoms with this medication.  She reported some dry mouth but no problems with constipation.  No incontinence.  She noted a decrease in her frequency and urgency as well.  No dysuria or gross hematuria.  No recent UTIs.  At her visit in April 2025, she continued on Solifenacin  5 mg nightly.  She noted a decrease in her urgency and daytime urge incontinence.  She reported nocturia 2-3 times and had 2 episodes of nighttime incontinence within the past 6 months.  No dysuria or gross hematuria. Her dose of solifenacin  was increased to 10 mg daily.  She returns today for follow-up.  She continues on solifenacin  10 mg daily.  She feels like this dose is working better for her.  She has decreased nocturia.  She has not had any incontinence episodes at night.  She does have occasional daytime incontinence.  She continues with some urgency.  No side effects from the medication.  No dysuria or gross hematuria.  Portions of the above documentation were copied from a prior visit for review purposes only.   Past Medical  History:  Past Medical History:  Diagnosis Date   Abnormality of gait 04/23/2019   Acquired hypothyroidism 07/29/2021   Allergic rhinitis due to animal hair and dander 12/02/2015   Formatting of this note might be different from the original.  [facility]  Seen by Dr. Leita 11/11/15     Plan- Received allergy shots today. Follow up 1 year.     Allergy    Anemia    Anemia    Anxiety    Arthritis of scaphoid-trapezium-trapezoid joint of right hand 09/10/2023   B12 deficiency    Back pain    Chronic fatigue 07/29/2021   Chronic fatigue syndrome    Chronic throat clearing 03/14/2023   Clotting disorder    Constipation    Depression with anxiety 04/07/2009   Formatting of this note might be different from the original.  well controlled on current meds, follows with psychiatrist in [place]. Had a   h/o severe depression 5 years ago, and had ECT.     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  B12 deficiency   Iron deficiency  Herniated lumbar intervertebral disc 11/12/2020   High cholesterol    History of blood clots    History of cervical spinal surgery 01/01/2018   Formatting of this note might be different from the original.  Fusion C6 and C7 per 2018 MRI report.     History of left hip replacement 01/14/2018   History of Roux-en-Y gastric bypass    Hoarseness 03/14/2023   Hyperlipidemia 08/19/2010   Hypertension    Hyponatremia 01/17/2018   Hypothyroidism    Hypothyroidism    IBS (irritable bowel syndrome) 04/06/2021   Impaired fasting glucose 08/19/2010   Incontinence 10/30/2013   Formatting of this note might be different from the original.  Formatting of this note might be different from the original. Urinary Incontinence     Infertility, female    Insomnia 04/07/2009   Formatting of this note might be different from the original.  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  Followed by [facility]  Seen by Dr. Linnette 03/01/15     S/p changing thoracic spinal cord stimulator generator 08/18/14.     Lumbar spine films show spinal cord generator in left flank area. 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  Follows with Rheumatology in [facility] of this note might be different from the original.  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  Getting iron infusion via port at hematalogy/oncology center at [facility].   Was recently hospitalized in [facility] for severe anemia     Formatting of this note might be different from the original.  on OTC b12 currently     Vitamin D  deficiency     Past Surgical History:  Past Surgical History:  Procedure Laterality Date   ABDOMINAL HYSTERECTOMY     APPENDECTOMY     CARPAL TUNNEL RELEASE Bilateral 2023   with thumb joint replacement   CERVICAL FUSION     CHOLECYSTECTOMY     FOOT SURGERY Bilateral    fusion   GASTRIC BYPASS     IR EMBO ARTERIAL NOT HEMORR HEMANG INC GUIDE ROADMAPPING  04/01/2024   IR RADIOLOGIST EVAL & MGMT  03/14/2024   IR RADIOLOGIST EVAL & MGMT  04/24/2024   SPINAL CORD STIMULATOR INSERTION  07/2023   TOTAL HIP ARTHROPLASTY Bilateral    11/2016 & 05/2017    Allergies:  Allergies  Allergen Reactions   Compazine [Prochlorperazine] Anaphylaxis   Phenothiazines Anaphylaxis and Swelling    Caused her throat to close     Prednisone Itching, Swelling and Rash    Facial swelling   Pregabalin Itching and Swelling     Swelling and itching of hands and feet.      Cefadroxil Itching    Family History:  Family History  Problem Relation Age of Onset   Hyperlipidemia Mother    Hypertension Mother    Heart attack Mother    Heart disease Mother    Sudden death Mother    Depression Mother    Anxiety disorder Mother    Obesity Mother    Anxiety disorder Father    Depression Father    Cancer Father    Hyperlipidemia Father    Hypertension Father    Lung cancer Father    Prostate cancer Father    Kidney cancer Brother    Healthy Daughter    Healthy Son     Social History:  Social History   Tobacco Use   Smoking status: Never    Passive exposure: Past   Smokeless tobacco: Never  Vaping Use   Vaping status: Never Used  Substance Use Topics   Alcohol use: Not Currently    Comment: rarely   Drug use: Never    ROS: Constitutional:  Negative for fever, chills, weight loss CV: Negative for chest pain, previous MI, hypertension Respiratory:  Negative for shortness of breath, wheezing, sleep apnea, frequent cough GI:  Negative for nausea, vomiting, bloody stool, GERD  Physical exam: BP (!) 150/78   Pulse (!) 53   Ht 5' 4 (1.626 m)   Wt 199 lb (90.3 kg)   BMI 34.16 kg/m  GENERAL APPEARANCE:  Well appearing, well developed, well nourished, NAD HEENT:  Atraumatic, normocephalic, oropharynx clear NECK:  Supple without lymphadenopathy or thyromegaly ABDOMEN:  Soft, non-tender, no masses EXTREMITIES:  Moves all extremities well, without clubbing, cyanosis, or edema NEUROLOGIC:  Alert and oriented x 3, normal gait, CN II-XII grossly intact MENTAL STATUS:  appropriate BACK:  Non-tender to palpation, No CVAT SKIN:  Warm, dry, and intact  Results: U/A: 0-5 WBCs, 3-10 RBCs

## 2024-10-09 ENCOUNTER — Ambulatory Visit: Admitting: Professional

## 2024-10-09 ENCOUNTER — Encounter: Payer: Self-pay | Admitting: Professional

## 2024-10-09 DIAGNOSIS — F4321 Adjustment disorder with depressed mood: Secondary | ICD-10-CM

## 2024-10-09 DIAGNOSIS — F411 Generalized anxiety disorder: Secondary | ICD-10-CM

## 2024-10-09 DIAGNOSIS — F331 Major depressive disorder, recurrent, moderate: Secondary | ICD-10-CM | POA: Diagnosis not present

## 2024-10-09 NOTE — Progress Notes (Signed)
 [facility] Counselor/Therapist Progress Note  Patient ID: Heather Thompson, MRN: [id number],    Date: 10/09/2024  Time Spent:  59 minutes 1105am-1204pm  Treatment Type: Individual Therapy  Risk Assessment: Danger to Self:  No Self-injurious Behavior: No Danger to Others: No  Subjective: This session was held via video teletherapy. The patient consented to video teletherapy and was located in her home for this session. She is aware it is the responsibility of the patient to secure confidentiality on her end of the session. The provider was in a private office for the duration of this session.    The patient arrived on time for her Caregility appointment.  Issues addressed: 1-attended family wedding in [place] a-she had a wonderful time at the wedding -pt stated that no one even noticed that she was wearing crocks and her feet felt good all day b-pt tearful as she shared that she doesn't fit in -she described the feeling of being an outsider in her own family -pt thinks her family doesn't want her around -discussed with pt that her family did not need to tell her nor invite her to her granddaughter's wedding c-discussed her ongoing belief of not being wanted and challenged her belief -she used her surgery as an example and that no one is going to be here to help her 2-pt back surgery on 10/13/2024 -pt planning to go in to inpt care post surgery as she has no one to provide her with support -she admits that she is happy about the surgery because the pain is so great  Treatment Plan Problems: Financial Stress, Low Self-Esteem, Attention Deficit Disorder (ADD) - Adult Symptoms: Unable to concentrate or pay attention to things of low interest, even when those things are important to his/her life. Restless and fidgety; unable to be sedentary for more than a short time. Disorganized in most areas of his/her life. Starts many projects but rarely finishes any. Chronic low  self-esteem. Tendency toward addictive behaviors. Indebtedness and overdue bills that exceed ability to meet monthly payments. A long-term lack of discipline in money management that has led to excessive indebtedness. A pattern of impulsive spending that does not consider the eventual financial consequences. Inability to accept compliments. Makes self-disparaging remarks; sees self as unattractive, worthless, a loser, a burden, unimportant; takes blame easily. Difficulty in saying no to others; assumes not being liked by others. Fear of rejection by others, especially peer group. Lack of any goals for life and setting of inappropriately low goals for self. Inability to identify positive characteristics of self. Anxious and uncomfortable in social situations. Goals: Reduce impulsive actions while increasing concentration and focus on low-interest activities. Minimize ADD behavioral interference in daily life. Sustain attention and concentration for consistently longer periods of time. Achieve an inner strength to control personal impulses, cravings, and desires that directly or indirectly increase debt irresponsibly. Resolve financial crisis with a path to eliminate debt. Revise spending patterns to not exceed income. Gain a new sense of self-worth in which the substance of one's value is not attached to the capacity to do things or own things that cost money. Understand personal desires, insecurities, and anxieties that make overspending possible. Elevate self-esteem. Develop a consistent, positive self-image. Demonstrate improved self-esteem through more pride in appearance, more assertiveness, greater eye contact, and identification of positive traits in self-talk messages. Establish an inward sense of self-worth, confidence, and competence. Interact socially without undue distress or disability. Objectives target date for all objectives is 03/25/2025: Identify the current specific  ADD  behaviors that cause the most difficulty. List the negative consequences of the ADD problematic behavior. Learn and implement organization and planning skills. Acknowledge procrastination and the need to reduce it. Learn and implement skills to reduce procrastination. Learn and implement skills to reduce the disruptive influence of distractibility. Combine skills learned in therapy into a new daily approach to managing ADHD. Use cognitive and behavioral strategies to control the impulse to make unnecessary and unaffordable purchases. Report instances of successful control over impulse to spend on unnecessary expenses. Increase insight into the historical and current sources of low self-esteem. Decrease the frequency of negative self-descriptive statements and increase frequency of positive self-descriptive statements. Articulate a plan to be proactive in trying to get identified needs met. Form realistic, appropriate, and attainable goals for self in all areas of life. Take verbal responsibility for accomplishments without discounting. Identify and replace negative self-talk messages used to reinforce low self-esteem. Objectives target date for all objectives is 03/13/2025: Identify the specific anxiety symptoms that are personally most disturbing or most contributing to impaired functioning. Learn and practice thought and behavioral control methods to minimize and control anxiety symptoms once they have begun. Identify specific thoughts that precipitate anxiety symptoms. Replace anxiety-producing thoughts with constructive thoughts. Verbalize an understanding of the general physical and cognitive manifestations of the causes for anxiety. Identify and clarify the patterns of anxiety precipitants and consequences. Identify whether the symptoms of depression seem to be primarily related to interpersonal relationships, stressful life events or circumstances, thoughts/beliefs, or  behaviors. Verbalize an understanding of the general physical and psychological effects of depression. Replace depression-promoting thoughts with mood-elevating thoughts. Keep a daily record of mood rating from 1 to 10, noting associated behaviors, activities, events, people, and thoughts. Identify specific thoughts that precipitate depressive moods. Monitor and report depressive symptoms, completing self-report assessment on a periodic basis. Describe and evaluate significant past and current interpersonal relationships. Tell memories of the deceased person, starting with positive memories. Consider possible ways of increasing involvement with others. Accept the reality of loss and tolerate the pain of intense grief. Engage in events and activities that increase level of social involvements. Identify and monitor specific pain triggers. Learn and implement somatic skills such as relaxation and/or biofeedback to reduce pain level. Learn mental coping skills and implement with somatic skills for managing acute pain. Interventions: Reinforce the client's belief system and/or refer to a spiritual leader who can support the client's faith. Explore and problem-solve with the client difficulties, fears, or barriers related to increasing social involvement. Point out to the client that engagement with others is associated with decreased feelings of loneliness, grief, depression, and hopelessness. Provide encouragement and support to the client for increased engagement in contact with others. Clarify with the client that expression of negative thoughts or feelings about the deceased is not disloyal. Assist the client in remembering the loved one in realistic ways (i.e., includes attractions, positive traits, negative traits, conflicts, ambivalence, dependency, etc.), a memory that is neither idealized nor demonized, so that the client's relationship with the deceased person can be appraised realistically  (or assign Dear _____: A Letter to a Lost Loved One in the Adult Psychotherapy Homework Planner, 2nd ed. by Jenniffer). Assign the client to read about progressive muscle relaxation and other calming strategies in relevant books or treatment manuals (e.g., Progressive Relaxation Training by Thornell armin Collier). Assign a homework exercise in which the client implements somatic pain management skills and records the result; review and process during the treatment  session. Teach the client distraction techniques (e.g., pleasant imagery, counting techniques, alternative focal points) and how to use them for relaxation skills for the management of acute episodes of pain. Ask the client to create a list of activities that are pleasurable to him/her (or assign Identify and Schedule Pleasant Activities in the Adult Psychotherapy Homework Planner, 2nd ed. by Jenniffer); process the list, developing a plan of increasing the frequency of engaging in the selected pleasurable activities. Teach the client problem-solving skills to apply to removal of obstacles to implementing new skills; role-play implementation of these skills to obstacles. Assist the client in identifying the most effective personal stress management techniques (e.g., prayer, walking, baking, telephoning a friend), and encourage daily scheduling of these activities (or assign Past Successful Anxiety Coping in the Adult Psychotherapy Homework Planner, 2nd ed. by Jenniffer). Teach the client the concept of finding a good match between the individual's capacities and the demands of the physical environment. If the physical environment is too demanding for the individual's capacities (e.g., frail older adult taking care of a large house), the individual can become overwhelmed and may need to move from large home to smaller accommodations or residence for older adults). While reviewing the client's anxiety symptom chart, help him/her recognize patterns  associated with anxiety symptoms: sort out precipitants from consequences, identify the most intense or frequent precipitants, and identify the consequences that help to perpetuate maladaptive patterns. Assist the client in identifying his/her current attempts to reducing anxiety (e.g., constantly telephoning family or physician, making unneeded doctor appointments or going to the emergency room), their apparent positive consequences (e.g., feeling better, getting attention), but longer-term negative consequences (e.g., physician won't return calls, friends avoid the client because of expressions of worry). Teach the client that his/her mood can be improved by increasing pleasant events and decreasing unpleasant events. Encourage the client to identify pleasant events that are desirable, but are not currently a part of his/her daily routine (see Identify and Schedule Pleasant Activities in the Adult Psychotherapy Homework Planner, 2nd ed. by Jenniffer). Develop a one-week daily schedule with the client that increases pleasant events and decreases unpleasant events, making sure to have at least one pleasant event every day. Help the client to understand his/her strengths and weaknesses in problem-solving, to view problems as challenges and not personal deficits, and to identify and define current problem(s) that will be the focus of treatment. Help the client to identify realistic goals to address problem(s) of concern and identify major obstacles in achieving goals. Encourage the client to implement chosen solutions to current life problem(s), self-monitor and evaluate solution implementation, and reward self for efforts. Explore the relationship of negative self-talk (e.g., I'm just a burden to everyone; Everyone would be better off if I were dead) and distorted beliefs (e.g., There's no one like me living here; I'd rather never leave my room than have anyone see me in a wheelchair) to depressed  mood (or assign Negative Thoughts Trigger Negative Feelings in the Adult Psychotherapy Homework Planner, 2nd ed. by Jenniffer; or Daily Record of Dysfunctional Thoughts in Cognitive Therapy of Depression by Almarie Candida Gentry and Shona). Encourage the client to distinguish between all-or-nothing thinking (e.g., Life in a nursing home can't be worth living) and genuine nuanced reflection on life's meaning and purpose (e.g., What's giving my life meaning at this stage?); confront the former, encourage the latter.  Diagnosis:Major depressive disorder, recurrent episode, moderate (HCC)  Generalized anxiety disorder  Grief  Plan:  -meet again on Tuesday, October 21, 2024 at [employer]

## 2024-10-15 ENCOUNTER — Ambulatory Visit

## 2024-10-21 ENCOUNTER — Ambulatory Visit (INDEPENDENT_AMBULATORY_CARE_PROVIDER_SITE_OTHER): Admitting: Professional

## 2024-10-21 ENCOUNTER — Encounter: Payer: Self-pay | Admitting: Professional

## 2024-10-21 DIAGNOSIS — F4321 Adjustment disorder with depressed mood: Secondary | ICD-10-CM

## 2024-10-21 DIAGNOSIS — F331 Major depressive disorder, recurrent, moderate: Secondary | ICD-10-CM

## 2024-10-21 DIAGNOSIS — F411 Generalized anxiety disorder: Secondary | ICD-10-CM

## 2024-10-21 NOTE — Progress Notes (Signed)
  Behavioral Health Counselor/Therapist Progress Note  Patient ID: Heather Thompson, MRN: [id number],    Date: 10/21/2024  Time Spent:  6 minutes 1152-1158am  Treatment Type: Individual Therapy  Risk Assessment: Danger to Self:  No Self-injurious Behavior: No Danger to Others: No  Subjective: This session was held via video teletherapy. The patient consented to audio teletherapy and was located at [facility] for this session. She is aware it is the responsibility of the patient to secure confidentiality on her end of the session. The provider was in a private office for the duration of this session.    The patient arrived on time for her Caregility appointment.  Issues addressed: 1-pt back surgery was successful -pt is only eight days post-op and has been in rehab for the past several days -pt is up and walking unassisted with no DME -pt has been informed that she will be discharged home on Saturday -pt feels anxious about this change -pt reports she has post-surgical pain that is pretty difficult but that it will improve as she recovers -pt still has nine staples in her neck and cannot move her neck -overall she is in good spirits  Treatment Plan Problems: Financial Stress, Low Self-Esteem, Attention Deficit Disorder (ADD) - Adult Symptoms: Unable to concentrate or pay attention to things of low interest, even when those things are important to his/her life. Restless and fidgety; unable to be sedentary for more than a short time. Disorganized in most areas of his/her life. Starts many projects but rarely finishes any. Chronic low self-esteem. Tendency toward addictive behaviors. Indebtedness and overdue bills that exceed ability to meet monthly payments. A long-term lack of discipline in money management that has led to excessive indebtedness. A pattern of impulsive spending that does not consider the eventual financial consequences. Inability to accept  compliments. Makes self-disparaging remarks; sees self as unattractive, worthless, a loser, a burden, unimportant; takes blame easily. Difficulty in saying no to others; assumes not being liked by others. Fear of rejection by others, especially peer group. Lack of any goals for life and setting of inappropriately low goals for self. Inability to identify positive characteristics of self. Anxious and uncomfortable in social situations. Goals: Reduce impulsive actions while increasing concentration and focus on low-interest activities. Minimize ADD behavioral interference in daily life. Sustain attention and concentration for consistently longer periods of time. Achieve an inner strength to control personal impulses, cravings, and desires that directly or indirectly increase debt irresponsibly. Resolve financial crisis with a path to eliminate debt. Revise spending patterns to not exceed income. Gain a new sense of self-worth in which the substance of one's value is not attached to the capacity to do things or own things that cost money. Understand personal desires, insecurities, and anxieties that make overspending possible. Elevate self-esteem. Develop a consistent, positive self-image. Demonstrate improved self-esteem through more pride in appearance, more assertiveness, greater eye contact, and identification of positive traits in self-talk messages. Establish an inward sense of self-worth, confidence, and competence. Interact socially without undue distress or disability. Objectives target date for all objectives is 03/25/2025: Identify the current specific ADD behaviors that cause the most difficulty. List the negative consequences of the ADD problematic behavior. Learn and implement organization and planning skills. Acknowledge procrastination and the need to reduce it. Learn and implement skills to reduce procrastination. Learn and implement skills to reduce the disruptive influence  of distractibility. Combine skills learned in therapy into a new daily approach to managing ADHD. Use cognitive and  behavioral strategies to control the impulse to make unnecessary and unaffordable purchases. Report instances of successful control over impulse to spend on unnecessary expenses. Increase insight into the historical and current sources of low self-esteem. Decrease the frequency of negative self-descriptive statements and increase frequency of positive self-descriptive statements. Articulate a plan to be proactive in trying to get identified needs met. Form realistic, appropriate, and attainable goals for self in all areas of life. Take verbal responsibility for accomplishments without discounting. Identify and replace negative self-talk messages used to reinforce low self-esteem. Objectives target date for all objectives is 03/13/2025: Identify the specific anxiety symptoms that are personally most disturbing or most contributing to impaired functioning. Learn and practice thought and behavioral control methods to minimize and control anxiety symptoms once they have begun. Identify specific thoughts that precipitate anxiety symptoms. Replace anxiety-producing thoughts with constructive thoughts. Verbalize an understanding of the general physical and cognitive manifestations of the causes for anxiety. Identify and clarify the patterns of anxiety precipitants and consequences. Identify whether the symptoms of depression seem to be primarily related to interpersonal relationships, stressful life events or circumstances, thoughts/beliefs, or behaviors. Verbalize an understanding of the general physical and psychological effects of depression. Replace depression-promoting thoughts with mood-elevating thoughts. Keep a daily record of mood rating from 1 to 10, noting associated behaviors, activities, events, people, and thoughts. Identify specific thoughts that precipitate depressive  moods. Monitor and report depressive symptoms, completing self-report assessment on a periodic basis. Describe and evaluate significant past and current interpersonal relationships. Tell memories of the deceased person, starting with positive memories. Consider possible ways of increasing involvement with others. Accept the reality of loss and tolerate the pain of intense grief. Engage in events and activities that increase level of social involvements. Identify and monitor specific pain triggers. Learn and implement somatic skills such as relaxation and/or biofeedback to reduce pain level. Learn mental coping skills and implement with somatic skills for managing acute pain. Interventions: Reinforce the client's belief system and/or refer to a spiritual leader who can support the client's faith. Explore and problem-solve with the client difficulties, fears, or barriers related to increasing social involvement. Point out to the client that engagement with others is associated with decreased feelings of loneliness, grief, depression, and hopelessness. Provide encouragement and support to the client for increased engagement in contact with others. Clarify with the client that expression of negative thoughts or feelings about the deceased is not disloyal. Assist the client in remembering the loved one in realistic ways (i.e., includes attractions, positive traits, negative traits, conflicts, ambivalence, dependency, etc.), a memory that is neither idealized nor demonized, so that the client's relationship with the deceased person can be appraised realistically (or assign Dear _____: A Letter to a Lost Loved One in the Adult Psychotherapy Homework Planner, 2nd ed. by Jenniffer). Assign the client to read about progressive muscle relaxation and other calming strategies in relevant books or treatment manuals (e.g., Progressive Relaxation Training by Thornell armin Collier). Assign a homework exercise in  which the client implements somatic pain management skills and records the result; review and process during the treatment session. Teach the client distraction techniques (e.g., pleasant imagery, counting techniques, alternative focal points) and how to use them for relaxation skills for the management of acute episodes of pain. Ask the client to create a list of activities that are pleasurable to him/her (or assign Identify and Schedule Pleasant Activities in the Adult Psychotherapy Homework Planner, 2nd ed. by Jenniffer); process the list, developing a  plan of increasing the frequency of engaging in the selected pleasurable activities. Teach the client problem-solving skills to apply to removal of obstacles to implementing new skills; role-play implementation of these skills to obstacles. Assist the client in identifying the most effective personal stress management techniques (e.g., prayer, walking, baking, telephoning a friend), and encourage daily scheduling of these activities (or assign Past Successful Anxiety Coping in the Adult Psychotherapy Homework Planner, 2nd ed. by Jenniffer). Teach the client the concept of finding a good match between the individual's capacities and the demands of the physical environment. If the physical environment is too demanding for the individual's capacities (e.g., frail older adult taking care of a large house), the individual can become overwhelmed and may need to move from large home to smaller accommodations or residence for older adults). While reviewing the client's anxiety symptom chart, help him/her recognize patterns associated with anxiety symptoms: sort out precipitants from consequences, identify the most intense or frequent precipitants, and identify the consequences that help to perpetuate maladaptive patterns. Assist the client in identifying his/her current attempts to reducing anxiety (e.g., constantly telephoning family or physician, making unneeded  doctor appointments or going to the emergency room), their apparent positive consequences (e.g., feeling better, getting attention), but longer-term negative consequences (e.g., physician won't return calls, friends avoid the client because of expressions of worry). Teach the client that his/her mood can be improved by increasing pleasant events and decreasing unpleasant events. Encourage the client to identify pleasant events that are desirable, but are not currently a part of his/her daily routine (see Identify and Schedule Pleasant Activities in the Adult Psychotherapy Homework Planner, 2nd ed. by Jenniffer). Develop a one-week daily schedule with the client that increases pleasant events and decreases unpleasant events, making sure to have at least one pleasant event every day. Help the client to understand his/her strengths and weaknesses in problem-solving, to view problems as challenges and not personal deficits, and to identify and define current problem(s) that will be the focus of treatment. Help the client to identify realistic goals to address problem(s) of concern and identify major obstacles in achieving goals. Encourage the client to implement chosen solutions to current life problem(s), self-monitor and evaluate solution implementation, and reward self for efforts. Explore the relationship of negative self-talk (e.g., I'm just a burden to everyone; Everyone would be better off if I were dead) and distorted beliefs (e.g., There's no one like me living here; I'd rather never leave my room than have anyone see me in a wheelchair) to depressed mood (or assign Negative Thoughts Trigger Negative Feelings in the Adult Psychotherapy Homework Planner, 2nd ed. by Jenniffer; or Daily Record of Dysfunctional Thoughts in Cognitive Therapy of Depression by Almarie Candida Gentry and Shona). Encourage the client to distinguish between all-or-nothing thinking (e.g., Life in a nursing home can't be  worth living) and genuine nuanced reflection on life's meaning and purpose (e.g., What's giving my life meaning at this stage?); confront the former, encourage the latter.  Diagnosis:Major depressive disorder, recurrent episode, moderate (HCC)  Generalized anxiety disorder  Grief  Plan:  -meet again on Thursday, November 06, 2024 at [employer]

## 2024-10-22 ENCOUNTER — Telehealth: Payer: Self-pay

## 2024-10-22 NOTE — Telephone Encounter (Signed)
 Copied from CRM #[id number]. Topic: General - Other >> Oct 22, 2024  1:46 PM Willma R wrote: Reason for CRM: Patient wants a message sent to her provider advising after her operation last week she was release to [facility] and is still currently there. She is tentatively scheduled for release on 11/29. Is unsure if she will be able to make her appointment on 12/2 or not. Will call on Monday to let us  know yes she can make it or no she needs to cancel.   Patient can be reached at [phone]

## 2024-10-28 ENCOUNTER — Encounter: Payer: Self-pay | Admitting: Medical-Surgical

## 2024-10-28 ENCOUNTER — Other Ambulatory Visit: Payer: Self-pay

## 2024-10-28 ENCOUNTER — Ambulatory Visit: Admitting: Medical-Surgical

## 2024-10-28 VITALS — BP 113/66 | HR 67 | Resp 20 | Ht 64.0 in | Wt 195.0 lb

## 2024-10-28 DIAGNOSIS — N3941 Urge incontinence: Secondary | ICD-10-CM

## 2024-10-28 DIAGNOSIS — L918 Other hypertrophic disorders of the skin: Secondary | ICD-10-CM

## 2024-10-28 DIAGNOSIS — Z981 Arthrodesis status: Secondary | ICD-10-CM

## 2024-10-28 DIAGNOSIS — Z09 Encounter for follow-up examination after completed treatment for conditions other than malignant neoplasm: Secondary | ICD-10-CM

## 2024-10-28 DIAGNOSIS — R21 Rash and other nonspecific skin eruption: Secondary | ICD-10-CM | POA: Diagnosis not present

## 2024-10-28 MED ORDER — NYSTATIN 100000 UNIT/GM EX POWD: CUTANEOUS | 0 refills | Status: AC

## 2024-10-28 MED ORDER — BUPROPION HCL ER (XL) 300 MG PO TB24: 300.0000 mg | ORAL_TABLET | Freq: Every day | ORAL | 0 refills | Status: AC

## 2024-10-28 NOTE — Progress Notes (Addendum)
 Established Patient Office Visit  Subjective   Patient ID: Heather Thompson, female    DOB: 04/09/1951  Age: 73 y.o. MRN: [id number]  Chief Complaint  Patient presents with   Hospitalization Follow-up    Neck Surgery - Fusion C1, C4    HPI  73 year old female presents for a 2 week follow up status post C1-C2 spinal fusion surgery. Patient was discharged from the hospital on 11/20 and placed in Rehab. Recently discharged from Rehab on 11/29. Patient states that she is doing well mostly, but is still in some pain. She is currently taking hydrocodone 5-325 mg  2-3 times per day and flexeril 20 mg twice per day. She had a follow up yesterday with the surgeon to have staples removed. Follow up in 6 weeks. She has not yet started PT, but plans to stop by today to see if she can get appt scheduled.  Patient reports noticing a rash a couple of days prior to discharge from rehab on her right and left forearm, and back of left hand. Denies itching. She was prescribed triamcinolone  cream with good relief. Also, reports having skin tags that she would like removed. Request referral to dermatology.   Review of Systems  Constitutional: Negative.   HENT: Negative.    Eyes: Negative.   Respiratory: Negative.    Cardiovascular: Negative.   Gastrointestinal: Negative.   Genitourinary: Negative.   Musculoskeletal:  Positive for neck pain.  Skin: Negative.   Neurological: Negative.   Endo/Heme/Allergies: Negative.   Psychiatric/Behavioral: Negative.        Objective:     BP 113/66 (BP Location: Right Arm, Cuff Size: Normal)   Pulse 67   Resp 20   Ht 5' 4 (1.626 m)   Wt 88.5 kg   SpO2 100%   BMI 33.47 kg/m  BP Readings from Last 3 Encounters:  10/28/24 113/66  10/07/24 (!) 150/78  09/19/24 120/75      Physical Exam Vitals and nursing note reviewed.  Constitutional:      General: She is not in acute distress.    Appearance: Normal appearance.  Cardiovascular:     Rate and Rhythm:  Normal rate and regular rhythm.     Pulses: Normal pulses.     Heart sounds: Normal heart sounds.  Pulmonary:     Effort: Pulmonary effort is normal.     Breath sounds: Normal breath sounds.  Skin:    Findings: Rash present.  Neurological:     General: No focal deficit present.     Mental Status: She is alert and oriented to person, place, and time.  Psychiatric:        Mood and Affect: Mood normal.        Behavior: Behavior normal.        Thought Content: Thought content normal.        Judgment: Judgment normal.     The 10-year ASCVD risk score (Arnett DK, et al., 2019) is: 15.1%    Assessment & Plan:   1. Status post cervical spinal fusion (Primary) 2. Hospital discharge follow-up - Spinal fusion surgery on 11/17 -Continue taking Hydrocodone and Flexeril for pain management as prescribed by surgeon. - Plan to start PT within the next week - Follow up with General Surgery in 6 weeks   3. Rash -Continue using triamcinolone  cream twice daily for up to 14 days   4. Multiple skin tags -Referral to dermatology   Return if symptoms worsen or fail to improve.  Derrek JINNY Freund, NP Student

## 2024-10-28 NOTE — Progress Notes (Signed)
 Medical screening examination/treatment was performed by qualified clinical staff member and as supervising provider I was immediately available for consultation/collaboration. I have reviewed documentation and agree with assessment and plan.  Thayer Ohm, DNP, APRN, FNP-BC [facility]

## 2024-10-29 ENCOUNTER — Ambulatory Visit: Attending: Neurological Surgery | Admitting: Physical Therapy

## 2024-10-29 ENCOUNTER — Other Ambulatory Visit: Payer: Self-pay

## 2024-10-29 ENCOUNTER — Encounter: Payer: Self-pay | Admitting: Physical Therapy

## 2024-10-29 DIAGNOSIS — M6281 Muscle weakness (generalized): Secondary | ICD-10-CM | POA: Insufficient documentation

## 2024-10-29 DIAGNOSIS — M542 Cervicalgia: Secondary | ICD-10-CM | POA: Insufficient documentation

## 2024-10-29 NOTE — Therapy (Addendum)
 OUTPATIENT PHYSICAL THERAPY CERVICAL EVALUATION   Patient Name: Heather Thompson MRN: [id number] DOB:Dec 29, 1950, 73 y.o., female Today's Date: 10/29/2024  END OF SESSION:  PT End of Session - 10/29/24 1458     Visit Number 1    Number of Visits 17    Date for Recertification  12/24/24    Authorization Type Medicare Part A and B    PT Start Time 1405    PT Stop Time 1447    PT Time Calculation (min) 42 min    Activity Tolerance Patient tolerated treatment well    Behavior During Therapy WFL for tasks assessed/performed          Past Medical History:  Diagnosis Date   Abnormality of gait 04/23/2019   Acquired hypothyroidism 07/29/2021   Allergic rhinitis due to animal hair and dander 12/02/2015   Formatting of this note might be different from the original.  [facility]  Seen by Dr. Leita 11/11/15     Plan- Received allergy shots today. Follow up 1 year.     Allergy    Anemia    Anemia    Anxiety    Arthritis of scaphoid-trapezium-trapezoid joint of right hand 09/10/2023   B12 deficiency    Back pain    Chronic fatigue 07/29/2021   Chronic fatigue syndrome    Chronic throat clearing 03/14/2023   Clotting disorder    Constipation    Depression with anxiety 04/07/2009   Formatting of this note might be different from the original.  well controlled on current meds, follows with psychiatrist in [place]. Had a   h/o severe depression 5 years ago, and had ECT.     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  B12 deficiency   Iron deficiency     Herniated lumbar intervertebral disc 11/12/2020   High cholesterol    History of blood clots    History of  cervical spinal surgery 01/01/2018   Formatting of this note might be different from the original.  Fusion C6 and C7 per 2018 MRI report.     History of left hip replacement 01/14/2018   History of Roux-en-Y gastric bypass    Hoarseness 03/14/2023   Hyperlipidemia 08/19/2010   Hypertension    Hyponatremia 01/17/2018   Hypothyroidism    Hypothyroidism    IBS (irritable bowel syndrome) 04/06/2021   Impaired fasting glucose 08/19/2010   Incontinence 10/30/2013   Formatting of this note might be different from the original. Formatting of this note might be different from the original. Urinary Incontinence     Infertility, female    Insomnia 04/07/2009   Formatting of this note might be different from the original.  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  Followed by [facility]  Seen by Dr. Linnette 03/01/15     S/p changing thoracic spinal cord stimulator generator 08/18/14.     Lumbar spine films show  spinal cord generator in left flank area. 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  Follows with Rheumatology in [facility] of this note might be different from the original.  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  Getting iron infusion via port at hematalogy/oncology center at [facility].   Was recently hospitalized in [facility] for severe anemia     Formatting of this note might be different from the original.  on OTC b12 currently     Vitamin D  deficiency    Past Surgical History:  Procedure Laterality Date   ABDOMINAL HYSTERECTOMY     APPENDECTOMY     CARPAL TUNNEL RELEASE Bilateral 2023   with thumb joint replacement   CERVICAL FUSION     CHOLECYSTECTOMY     FOOT SURGERY Bilateral    fusion   GASTRIC BYPASS     IR EMBO  ARTERIAL NOT HEMORR HEMANG INC GUIDE ROADMAPPING  04/01/2024   IR RADIOLOGIST EVAL & MGMT  03/14/2024   IR RADIOLOGIST EVAL & MGMT  04/24/2024   SPINAL CORD STIMULATOR INSERTION  07/2023   TOTAL HIP ARTHROPLASTY Bilateral    11/2016 & 05/2017   Patient Active Problem List   Diagnosis Date Noted   Onychodystrophy 08/14/2024   Oral candidiasis 07/08/2024   Cellulitis of left foot 06/30/2024   Chronic bilateral hip pain after total replacement of both hip joints 06/18/2024   Left leg pain 06/18/2024   Mononeuropathy 12/14/2023   Palpitations 10/04/2023   Cardiac murmur 10/04/2023   Allergy    Anemia  Anxiety    B12 deficiency    Back pain    Chronic fatigue syndrome    Clotting disorder    Constipation    Edema of both lower extremities    Fatty liver    Fibromyalgia    Gallbladder problem    High cholesterol    History of blood clots    History of Roux-en-Y gastric bypass    Hypothyroidism    Infertility, female    Joint pain    Kidney stones    Muscle disorder    Osteoarthritis    Prediabetes    Salzmann nodular degeneration    SOBOE (shortness of breath on exertion)    Swallowing difficulty    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   Arthritis of scaphoid-trapezium-trapezoid joint of right hand 09/10/2023   Urge incontinence 08/28/2023   OAB (overactive bladder) 08/28/2023   Lumbar post-laminectomy syndrome 07/06/2023   Snapping hip syndrome, 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   Other myositis, left thigh 04/24/2023   Sacroiliitis, not elsewhere classified 04/24/2023   Dysphagia 04/17/2023   Elevated liver enzymes 04/02/2023   Elevated TSH 04/02/2023   Chronic throat clearing 03/14/2023   Globus sensation 03/14/2023   Hoarseness 03/14/2023   Muscle tension dysphonia 03/12/2023   Unspecified inflammatory spondylopathy, 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   Primary osteoarthritis of both knees 12/21/2021   Port-A-Cath in place 07/29/2021   Acquired hypothyroidism 07/29/2021   Chronic  pain syndrome 07/29/2021   Vitamin B12 deficiency 07/29/2021   Vitamin D  deficiency 07/29/2021   Postmenopausal estrogen deficiency 07/29/2021   IBS (irritable bowel syndrome) 04/06/2021   Retention of urine 04/06/2021   Herniated lumbar intervertebral disc 11/12/2020   Skin sensation disturbance 11/12/2020   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   Spondylopathy, unspecified 11/12/2020   Trochanteric bursitis of both hips 11/19/2019   Low serum iron 04/23/2019   Osteomalacia 04/23/2019   Abnormality of gait 04/23/2019   History of bilateral hip arthroplasty 06/26/2018   Gastroesophageal reflux disease 06/18/2018   Hypertension 06/18/2018   Intertrigo 04/30/2018   Lentigines 04/30/2018   Hyponatremia 01/17/2018   History of cervical spinal surgery 01/01/2018   Obesity (BMI 30-39.9) 01/01/2018   SK (seborrheic keratosis) 04/18/2017   Pain management contract agreement 11/17/2016   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   Allergic rhinitis due to animal hair and dander 12/02/2015   Low back pain 03/03/2015   Incontinence 10/30/2013   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   Neutrophilic leukocytosis 08/12/2012   Thrombocytosis 08/12/2012   Osteoporosis, senile 11/15/2010   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   Hyperlipidemia 08/19/2010   Impaired fasting glucose 08/19/2010   Polyarthritis 09/28/2009   Degenerative disc disease 04/07/2009   Depression with anxiety 04/07/2009   Myopathy 04/07/2009   Insomnia 04/07/2009   Migraine 04/07/2009   Scoliosis 04/07/2009   Seasonal allergies 04/07/2009   Vitamin B12 deficiency anemia 04/07/2009    PCP: No PCP  REFERRING PROVIDER: Zada Palin, NP  REFERRING DIAG: M45 Ankylosing Spondylitis of cervical region M50.11 Cervical radiculopathy M79.7 Fibromyalgia  THERAPY DIAG:  Cervicalgia  Muscle weakness (generalized)  Rationale for Evaluation and Treatment: Rehabilitation  ONSET DATE: November 10   SUBJECTIVE:  SUBJECTIVE STATEMENT: Pt reports she was supposed to have knee surgery in August, however the week before, she was diagnosed for having cellulitis in left leg, requiring hospitalization. Around the same time, pt also needed to have and was approved having C1-C2 fusion surgery on Nov 10, 25. She feels very tight on the back of the neck muscles, stating she cannot open her mouth to let food in all the way, and has difficulty chewing food. Reports staying 8 days in inpatient rehab after surgery. Staples removed 3 days ago. At this time her knee surgery is cancelled, and focus is on rehabilitating the neck.      Hand dominance: Left  PERTINENT HISTORY:  See above  Spinal stimulator C1-C2 fusion   PAIN:  Are you having pain? Yes: NPRS scale: 8/10 Pain location: back of neck , bil upper traps, upper anterior thoracic  Pain description: constant, throbbing, sharp, tight, electric  Aggravating factors: everything hurts Relieving factors: muscle relxer , pain meds   PRECAUTIONS: Other: Spinal stimulator   RED FLAGS: None     WEIGHT BEARING RESTRICTIONS: No  FALLS:  Has patient fallen in last 6 months? No  LIVING ENVIRONMENT: Lives with: lives alone Lives in: House/apartment Stairs: Yes: External: 4 steps; can reach both Has following equipment at home: None  OCCUPATION: Retired   PLOF: Independent  PATIENT GOALS: To move my neck more, less tightness, get the rocks out of my neck and head  NEXT MD VISIT: mid Jan 2026  OBJECTIVE:  Note: Objective measures were completed at Evaluation unless otherwise noted.  DIAGNOSTIC FINDINGS:  None on file  PATIENT SURVEYS:  NDI: 62% (31/50)  COGNITION: Overall cognitive status: Within functional limits for tasks assessed  SENSATION: Not tested  POSTURE: rounded  shoulders and forward head  PALPATION: Tender to palpate at bilateral upper traps Lt>Rt Tender:  -Bil lev scap Lt>Rt -suboccipitals - Bil Scalenes -Bil upper anterior thoracic region   CERVICAL ROM:   Active ROM A/PROM (deg) eval  Flexion 20 pain  Extension 15 pain  Right lateral flexion 10 pain  Left lateral flexion 30 pain  Right rotation 15 pain  Left rotation 11 pain   (Blank rows = not tested)  UPPER EXTREMITY ROM:  Active ROM Right eval Left eval  Shoulder flexion 130 145  Shoulder extension [facility] WFL  Shoulder abduction [facility]  Shoulder adduction    Shoulder extension    Shoulder internal rotation [facility] WFL  Shoulder external rotation [facility] WFL  Elbow flexion    Elbow extension    Wrist flexion    Wrist extension    Wrist ulnar deviation    Wrist radial deviation    Wrist pronation    Wrist supination     (Blank rows = not tested)  UPPER EXTREMITY MMT:  MMT Right eval Left eval  Shoulder flexion 4- 4  Shoulder extension    Shoulder abduction 4+ 4+  Shoulder adduction    Shoulder extension 4 4  Shoulder internal rotation 4+ 4+  Shoulder external rotation 4+ 4+  Middle trapezius    Lower trapezius    Elbow flexion 4 4  Elbow extension 4 4  Wrist flexion 4+ 4+  Wrist extension 4 4  Wrist ulnar deviation    Wrist radial deviation    Wrist pronation    Wrist supination    Grip strength     (Blank rows = not tested)   FUNCTIONAL TESTS:  5 times sit to stand: 19.32 sec  TREATMENT DATE:  [facility] Adult PT Treatment:                                                DATE: 10/29/24 Therapeutic Exercise: See HEP                                                                                                                                  PATIENT EDUCATION:  Education details: HEP and POC Person educated: Patient Education method: Explanation, Demonstration, Tactile cues, Verbal cues, and Handouts Education comprehension: verbalized understanding,  returned demonstration, verbal cues required, tactile cues required, and needs further education  HOME EXERCISE PROGRAM: Access Code: [facility] URL: [email or web address] Date: 10/29/2024 Prepared by: Lavanda Cleverly  Exercises - Seated Shoulder Shrugs  - 1 x daily - 7 x weekly - 3 sets - 10 reps - Beginner Hug a Tree  - 1 x daily - 7 x weekly - 3 sets - 10 reps - Seated Cervical Sidebending AROM  - 1 x daily - 7 x weekly - 3 sets - 10 reps  ASSESSMENT:  CLINICAL IMPRESSION: Patient is a 73 y.o. female who was seen today for physical therapy evaluation and treatment for neck pain. Had C1-C2 fusion surgery on Oct 06, 2024 and currently has spinal stimulator, staples removed 3 days ago. Upon assessment pt presents with decreased shoulder strength, severely decreased cervical ROM esp with rotation(see above), high level pain, and decreased UE functional capacity. Pt will benefit from skilled therapy to address the deficits below.   OBJECTIVE IMPAIRMENTS: Abnormal gait, decreased activity tolerance, decreased balance, decreased coordination, decreased endurance, decreased mobility, decreased ROM, decreased strength, hypomobility, increased fascial restrictions, increased muscle spasms, impaired flexibility, impaired UE functional use, improper body mechanics, postural dysfunction, and pain.   ACTIVITY LIMITATIONS: carrying, lifting, bending, sitting, standing, squatting, sleeping, stairs, transfers, bed mobility, bathing, toileting, dressing, and reach over head  PARTICIPATION LIMITATIONS: meal prep, cleaning, laundry, driving, and community activity  PERSONAL FACTORS: Age, Fitness, Past/current experiences, Time since onset of injury/illness/exacerbation, and 3+ comorbidities: Spinal stimulator and extensive PMH are also affecting patient's functional outcome.   REHAB POTENTIAL: Fair Due to extensive PMH, age   CLINICAL DECISION MAKING: Evolving/moderate complexity  EVALUATION  COMPLEXITY: Moderate   GOALS: Goals reviewed with patient? Yes  SHORT TERM GOALS: Target date: 11/29/24  Pt will be efficient with initial HEP so she has increased independence for everyday activities.  Baseline:  Goal status: INITIAL  2.  Pt will report no more than 6/10 neck pain so she has increased functional capacity for driving safely Baseline: 1/89 Goal status: INITIAL  3.  Pt will improve 5 sit to stands test by completing in 14 sec or less so she has increased strength and mobility for community services Baseline:  Goal status: INITIAL  LONG TERM GOALS: Target date: 12/24/24  Pt will be efficient with advanced HEP so she has increased independence for everyday activities.  Baseline:  Goal status: INITIAL  2.  Pt will be able to increase cervical flexion and extension ROM at least 10 deg or more so she has increased functional capacity for everyday ADL's (ex: reading,etc) Baseline: flexion 20 deg, ext 15 deg  Goal status: INITIAL  3.  Pt will improve NDI survey by scoring at least 10 % improvement so she can lift objects for ADL's safely Baseline: 62% (31/50) Goal status: INITIAL     PLAN:  PT FREQUENCY: 2x/week  PT DURATION: 8 weeks  PLANNED INTERVENTIONS: [id number]- PT Re-evaluation, 97750- Physical Performance Testing, 97110-Therapeutic exercises, 97530- Therapeutic activity, 97112- Neuromuscular re-education, 97535- Self Care, [id number]- Manual therapy, [id number]- Gait training, [phone]- Aquatic Therapy, [phone] (1-2 muscles), 20561 (3+ muscles)- Dry Needling, Patient/Family education, Balance training, Stair training, Joint mobilization, Spinal mobilization, Cryotherapy, and Moist heat  PLAN FOR NEXT SESSION: neck stretches as tolerated, review HEP, deep neck flexor strengthening, median, ulnar, radial nerve mobilization, cervical flexion AROM   Lavanda Cleverly, Student-PT 10/29/2024, 3:02 PM

## 2024-10-31 ENCOUNTER — Ambulatory Visit: Admitting: Physical Therapy

## 2024-11-04 ENCOUNTER — Encounter: Payer: Self-pay | Admitting: Physical Therapy

## 2024-11-04 ENCOUNTER — Ambulatory Visit: Admitting: Physical Therapy

## 2024-11-04 DIAGNOSIS — M542 Cervicalgia: Secondary | ICD-10-CM | POA: Diagnosis not present

## 2024-11-04 DIAGNOSIS — M6281 Muscle weakness (generalized): Secondary | ICD-10-CM

## 2024-11-04 NOTE — Therapy (Signed)
 OUTPATIENT PHYSICAL THERAPY CERVICAL TREATMENT   Patient Name: Heather Thompson MRN: [id number] DOB:[birth year], 73 y.o., female Today's Date: 11/04/2024  END OF SESSION:  PT End of Session - 11/04/24 1401     Visit Number 2    Number of Visits 17    Date for Recertification  12/24/24    Authorization Type Medicare Part A and B    PT Start Time 1315    PT Stop Time 1402    PT Time Calculation (min) 47 min    Activity Tolerance Patient tolerated treatment well    Behavior During Therapy WFL for tasks assessed/performed           Past Medical History:  Diagnosis Date   Abnormality of gait 04/23/2019   Acquired hypothyroidism 07/29/2021   Allergic rhinitis due to animal hair and dander 12/02/2015   Formatting of this note might be different from the original.  [facility]  Seen by Dr. Leita 11/11/15     Plan- Received allergy shots today. Follow up 1 year.     Allergy    Anemia    Anemia    Anxiety    Arthritis of scaphoid-trapezium-trapezoid joint of right hand 09/10/2023   B12 deficiency    Back pain    Chronic fatigue 07/29/2021   Chronic fatigue syndrome    Chronic throat clearing 03/14/2023   Clotting disorder    Constipation    Depression with anxiety 04/07/2009   Formatting of this note might be different from the original.  well controlled on current meds, follows with psychiatrist in [place]. Had a   h/o severe depression 5 years ago, and had ECT.     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  B12 deficiency   Iron deficiency     Herniated lumbar intervertebral disc 11/12/2020   High cholesterol    History of blood clots    History of  cervical spinal surgery 01/01/2018   Formatting of this note might be different from the original.  Fusion C6 and C7 per 2018 MRI report.     History of left hip replacement 01/14/2018   History of Roux-en-Y gastric bypass    Hoarseness 03/14/2023   Hyperlipidemia 08/19/2010   Hypertension    Hyponatremia 01/17/2018   Hypothyroidism    Hypothyroidism    IBS (irritable bowel syndrome) 04/06/2021   Impaired fasting glucose 08/19/2010   Incontinence 10/30/2013   Formatting of this note might be different from the original. Formatting of this note might be different from the original. Urinary Incontinence     Infertility, female    Insomnia 04/07/2009   Formatting of this note might be different from the original.  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  Followed by [facility]  Seen by Dr. Linnette 03/01/15     S/p changing thoracic spinal cord stimulator generator 08/18/14.     Lumbar spine films  show spinal cord generator in left flank area. 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  Follows with Rheumatology in [facility] of this note might be different from the original.  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  Getting iron infusion via port at hematalogy/oncology center at [facility].   Was recently hospitalized in [facility] for severe anemia     Formatting of this note might be different from the original.  on OTC b12 currently     Vitamin D  deficiency    Past Surgical History:  Procedure Laterality Date   ABDOMINAL HYSTERECTOMY     APPENDECTOMY     CARPAL TUNNEL RELEASE Bilateral 2023   with thumb joint replacement   CERVICAL FUSION     CHOLECYSTECTOMY     FOOT SURGERY Bilateral    fusion   GASTRIC BYPASS     IR EMBO  ARTERIAL NOT HEMORR HEMANG INC GUIDE ROADMAPPING  04/01/2024   IR RADIOLOGIST EVAL & MGMT  03/14/2024   IR RADIOLOGIST EVAL & MGMT  04/24/2024   SPINAL CORD STIMULATOR INSERTION  07/2023   TOTAL HIP ARTHROPLASTY Bilateral    11/2016 & 05/2017   Patient Active Problem List   Diagnosis Date Noted   Onychodystrophy 08/14/2024   Oral candidiasis 07/08/2024   Cellulitis of left foot 06/30/2024   Chronic bilateral hip pain after total replacement of both hip joints 06/18/2024   Left leg pain 06/18/2024   Mononeuropathy 12/14/2023   Palpitations 10/04/2023   Cardiac murmur 10/04/2023   Allergy    Anemia  Anxiety    B12 deficiency    Back pain    Chronic fatigue syndrome    Clotting disorder    Constipation    Edema of both lower extremities    Fatty liver    Fibromyalgia    Gallbladder problem    High cholesterol    History of blood clots    History of Roux-en-Y gastric bypass    Hypothyroidism    Infertility, female    Joint pain    Kidney stones    Muscle disorder    Osteoarthritis    Prediabetes    Salzmann nodular degeneration    SOBOE (shortness of breath on exertion)    Swallowing difficulty    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   Arthritis of scaphoid-trapezium-trapezoid joint of right hand 09/10/2023   Urge incontinence 08/28/2023   OAB (overactive bladder) 08/28/2023   Lumbar post-laminectomy syndrome 07/06/2023   Snapping hip syndrome, 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   Other myositis, left thigh 04/24/2023   Sacroiliitis, not elsewhere classified 04/24/2023   Dysphagia 04/17/2023   Elevated liver enzymes 04/02/2023   Elevated TSH 04/02/2023   Chronic throat clearing 03/14/2023   Globus sensation 03/14/2023   Hoarseness 03/14/2023   Muscle tension dysphonia 03/12/2023   Unspecified inflammatory spondylopathy, 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   Primary osteoarthritis of both knees 12/21/2021   Port-A-Cath in place 07/29/2021   Acquired hypothyroidism 07/29/2021   Chronic  pain syndrome 07/29/2021   Vitamin B12 deficiency 07/29/2021   Vitamin D  deficiency 07/29/2021   Postmenopausal estrogen deficiency 07/29/2021   IBS (irritable bowel syndrome) 04/06/2021   Retention of urine 04/06/2021   Herniated lumbar intervertebral disc 11/12/2020   Skin sensation disturbance 11/12/2020   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   Spondylopathy, unspecified 11/12/2020   Trochanteric bursitis of both hips 11/19/2019   Low serum iron 04/23/2019   Osteomalacia 04/23/2019   Abnormality of gait 04/23/2019   History of bilateral hip arthroplasty 06/26/2018   Gastroesophageal reflux disease 06/18/2018   Hypertension 06/18/2018   Intertrigo 04/30/2018   Lentigines 04/30/2018   Hyponatremia 01/17/2018   History of cervical spinal surgery 01/01/2018   Obesity (BMI 30-39.9) 01/01/2018   SK (seborrheic keratosis) 04/18/2017   Pain management contract agreement 11/17/2016   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   Allergic rhinitis due to animal hair and dander 12/02/2015   Low back pain 03/03/2015   Incontinence 10/30/2013   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   Neutrophilic leukocytosis 08/12/2012   Thrombocytosis 08/12/2012   Osteoporosis, senile 11/15/2010   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   Hyperlipidemia 08/19/2010   Impaired fasting glucose 08/19/2010   Polyarthritis 09/28/2009   Degenerative disc disease 04/07/2009   Depression with anxiety 04/07/2009   Myopathy 04/07/2009   Insomnia 04/07/2009   Migraine 04/07/2009   Scoliosis 04/07/2009   Seasonal allergies 04/07/2009   Vitamin B12 deficiency anemia 04/07/2009    PCP: No PCP  REFERRING PROVIDER: Zada Palin, NP  REFERRING DIAG: M45 Ankylosing Spondylitis of cervical region M50.11 Cervical radiculopathy M79.7 Fibromyalgia  THERAPY DIAG:  Cervicalgia  Muscle weakness (generalized)  Rationale for Evaluation and Treatment: Rehabilitation  ONSET DATE: November 10   SUBJECTIVE:  SUBJECTIVE STATEMENT:   Pt states she is sore today, but nothing too bad  Hand dominance: Left  PERTINENT HISTORY:  See above  Spinal stimulator C1-C2 fusion   Pt reports she was supposed to have knee surgery in August, however the week before, she was diagnosed for having cellulitis in left leg, requiring hospitalization. Around the same time, pt also needed to have and was approved having C1-C2 fusion surgery on Nov 10, 25. She feels very tight on the back of the neck muscles, stating she cannot open her mouth to let food in all the way, and has difficulty chewing food. Reports staying 8 days in inpatient rehab after surgery. Staples removed 3 days ago. At this time her knee surgery is cancelled, and focus is on rehabilitating the neck.   PAIN:  Are you having pain? Yes: NPRS scale: 8/10 Pain location: back of neck , bil upper traps, upper anterior thoracic  Pain description: constant, throbbing, sharp, tight, electric  Aggravating factors: everything hurts Relieving factors: muscle relxer , pain meds   PRECAUTIONS: Other: Spinal stimulator   RED FLAGS: None     WEIGHT BEARING RESTRICTIONS: No  FALLS:  Has patient fallen in last 6 months? No  LIVING ENVIRONMENT: Lives with: lives alone Lives in: House/apartment Stairs: Yes: External: 4 steps; can reach both Has following equipment at home: None  OCCUPATION: Retired   PLOF: Independent  PATIENT GOALS: To move my neck more, less tightness, get the rocks out of my neck and head  NEXT MD VISIT: mid Jan 2026  OBJECTIVE:  Note: Objective measures were completed at Evaluation unless otherwise noted.  DIAGNOSTIC FINDINGS:  None on file  PATIENT SURVEYS:  NDI: 62% (31/50)  COGNITION: Overall cognitive status: Within functional limits for tasks  assessed  SENSATION: Not tested  POSTURE: rounded shoulders and forward head  PALPATION: Tender to palpate at bilateral upper traps Lt>Rt Tender:  -Bil lev scap Lt>Rt -suboccipitals - Bil Scalenes -Bil upper anterior thoracic region   CERVICAL ROM:   Active ROM A/PROM (deg) eval  Flexion 20 pain  Extension 15 pain  Right lateral flexion 10 pain  Left lateral flexion 30 pain  Right rotation 15 pain  Left rotation 11 pain   (Blank rows = not tested)  UPPER EXTREMITY ROM:  Active ROM Right eval Left eval  Shoulder flexion 130 145  Shoulder extension [facility]  Shoulder abduction [facility]  Shoulder adduction    Shoulder extension    Shoulder internal rotation [facility] WFL  Shoulder external rotation [facility] WFL  Elbow flexion    Elbow extension    Wrist flexion    Wrist extension    Wrist ulnar deviation    Wrist radial deviation    Wrist pronation    Wrist supination     (Blank rows = not tested)  UPPER EXTREMITY MMT:  MMT Right eval Left eval  Shoulder flexion 4- 4  Shoulder extension    Shoulder abduction 4+ 4+  Shoulder adduction    Shoulder extension 4 4  Shoulder internal rotation 4+ 4+  Shoulder external rotation 4+ 4+  Middle trapezius    Lower trapezius    Elbow flexion 4 4  Elbow extension 4 4  Wrist flexion 4+ 4+  Wrist extension 4 4  Wrist ulnar deviation    Wrist radial deviation    Wrist pronation    Wrist supination    Grip strength     (Blank rows = not tested)   FUNCTIONAL  TESTS:  5 times sit to stand: 19.32 sec   TREATMENT DATE:  [facility] Adult PT Treatment:                                                DATE: 11/04/24  Manual Therapy: STM cervical paraspinals, bilat UT, suboccipitals Gentle ROM c spine to tolerance Neuromuscular re-ed: Head on coregeous ball: cervical rotation, cervical nods Therapeutic Activity: Posterior shoulder rolls x 10 Scap squeeze x 10 Shoulder elevation/depression x 10 Hug a tree 2 x  8  Modalities: Moist heat cervical x 10 min Self Care: Incision with no redness or drainage. Appears closed at this time  [facility] Adult PT Treatment:                                                DATE: 10/29/24 Therapeutic Exercise: See HEP                                                                                                                                  PATIENT EDUCATION:  Education details: HEP and POC Person educated: Patient Education method: Explanation, Demonstration, Tactile cues, Verbal cues, and Handouts Education comprehension: verbalized understanding, returned demonstration, verbal cues required, tactile cues required, and needs further education  HOME EXERCISE PROGRAM: Access Code: [facility] URL: [email or web address] Date: 10/29/2024 Prepared by: Lavanda Cleverly  Exercises - Seated Shoulder Shrugs  - 1 x daily - 7 x weekly - 3 sets - 10 reps - Beginner Hug a Tree  - 1 x daily - 7 x weekly - 3 sets - 10 reps - Seated Cervical Sidebending AROM  - 1 x daily - 7 x weekly - 3 sets - 10 reps  ASSESSMENT:  CLINICAL IMPRESSION: Pt with good tolerance to session today. Able to improve ROM and exercise tolerance after manual work. Encouraged pt to continue ROM exercises as tolerated to maintain ROM gains during session  OBJECTIVE IMPAIRMENTS: Abnormal gait, decreased activity tolerance, decreased balance, decreased coordination, decreased endurance, decreased mobility, decreased ROM, decreased strength, hypomobility, increased fascial restrictions, increased muscle spasms, impaired flexibility, impaired UE functional use, improper body mechanics, postural dysfunction, and pain.    GOALS: Goals reviewed with patient? Yes  SHORT TERM GOALS: Target date: 11/29/24  Pt will be efficient with initial HEP so she has increased independence for everyday activities.  Baseline:  Goal status: INITIAL  2.  Pt will report no more than 6/10 neck pain so she  has increased functional capacity for driving safely Baseline: 1/89 Goal status: INITIAL  3.  Pt will improve 5 sit to stands test by completing in 14 sec or less so she has increased strength and  mobility for community services Baseline:  Goal status: INITIAL    LONG TERM GOALS: Target date: 12/24/24  Pt will be efficient with advanced HEP so she has increased independence for everyday activities.  Baseline:  Goal status: INITIAL  2.  Pt will be able to increase cervical flexion and extension ROM at least 10 deg or more so she has increased functional capacity for everyday ADL's (ex: reading,etc) Baseline: flexion 20 deg, ext 15 deg  Goal status: INITIAL  3.  Pt will improve NDI survey by scoring at least 10 % improvement so she can lift objects for ADL's safely Baseline: 62% (31/50) Goal status: INITIAL     PLAN:  PT FREQUENCY: 2x/week  PT DURATION: 8 weeks  PLANNED INTERVENTIONS: [id number]- PT Re-evaluation, 97750- Physical Performance Testing, 97110-Therapeutic exercises, 97530- Therapeutic activity, 97112- Neuromuscular re-education, 97535- Self Care, [id number]- Manual therapy, [id number]- Gait training, [phone]- Aquatic Therapy, [phone] (1-2 muscles), [id number] (3+ muscles)- Dry Needling, Patient/Family education, Balance training, Stair training, Joint mobilization, Spinal mobilization, Cryotherapy, and Moist heat  PLAN FOR NEXT SESSION: neck stretches as tolerated, review HEP, deep neck flexor strengthening, median, ulnar, radial nerve mobilization, cervical flexion AROM   Stpehen Petitjean, PT 11/04/2024, 2:03 PM

## 2024-11-06 ENCOUNTER — Encounter: Payer: Self-pay | Admitting: Professional

## 2024-11-06 ENCOUNTER — Ambulatory Visit: Admitting: Rehabilitative and Restorative Service Providers"

## 2024-11-06 ENCOUNTER — Ambulatory Visit: Admitting: Professional

## 2024-11-06 ENCOUNTER — Encounter: Payer: Self-pay | Admitting: Rehabilitative and Restorative Service Providers"

## 2024-11-06 DIAGNOSIS — F4321 Adjustment disorder with depressed mood: Secondary | ICD-10-CM | POA: Diagnosis not present

## 2024-11-06 DIAGNOSIS — F411 Generalized anxiety disorder: Secondary | ICD-10-CM | POA: Diagnosis not present

## 2024-11-06 DIAGNOSIS — F331 Major depressive disorder, recurrent, moderate: Secondary | ICD-10-CM | POA: Diagnosis not present

## 2024-11-06 DIAGNOSIS — M542 Cervicalgia: Secondary | ICD-10-CM | POA: Diagnosis not present

## 2024-11-06 DIAGNOSIS — M6281 Muscle weakness (generalized): Secondary | ICD-10-CM

## 2024-11-06 NOTE — Therapy (Signed)
 OUTPATIENT PHYSICAL THERAPY CERVICAL TREATMENT   Patient Name: Heather Thompson MRN: [id number] DOB:October 09, 1951, 73 y.o., female Today's Date: 11/06/2024  END OF SESSION:  PT End of Session - 11/06/24 1020     Visit Number 3    Number of Visits 17    Date for Recertification  12/24/24    Authorization Type Medicare Part A and B    Progress Note Due on Visit 10    PT Start Time 1015    PT Stop Time 1100    PT Time Calculation (min) 45 min    Activity Tolerance Patient tolerated treatment well           Past Medical History:  Diagnosis Date   Abnormality of gait 04/23/2019   Acquired hypothyroidism 07/29/2021   Allergic rhinitis due to animal hair and dander 12/02/2015   Formatting of this note might be different from the original.  [facility]  Seen by Dr. Leita 11/11/15     Plan- Received allergy shots today. Follow up 1 year.     Allergy    Anemia    Anemia    Anxiety    Arthritis of scaphoid-trapezium-trapezoid joint of right hand 09/10/2023   B12 deficiency    Back pain    Chronic fatigue 07/29/2021   Chronic fatigue syndrome    Chronic throat clearing 03/14/2023   Clotting disorder    Constipation    Depression with anxiety 04/07/2009   Formatting of this note might be different from the original.  well controlled on current meds, follows with psychiatrist in [place]. Had a   h/o severe depression 5 years ago, and had ECT.     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  B12 deficiency   Iron deficiency     Herniated lumbar intervertebral disc 11/12/2020   High cholesterol    History of blood clots    History of cervical spinal surgery  01/01/2018   Formatting of this note might be different from the original.  Fusion C6 and C7 per 2018 MRI report.     History of left hip replacement 01/14/2018   History of Roux-en-Y gastric bypass    Hoarseness 03/14/2023   Hyperlipidemia 08/19/2010   Hypertension    Hyponatremia 01/17/2018   Hypothyroidism    Hypothyroidism    IBS (irritable bowel syndrome) 04/06/2021   Impaired fasting glucose 08/19/2010   Incontinence 10/30/2013   Formatting of this note might be different from the original. Formatting of this note might be different from the original. Urinary Incontinence     Infertility, female    Insomnia 04/07/2009   Formatting of this note might be different from the original.  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  Followed by [facility]  Seen by Dr. Linnette 03/01/15     S/p changing thoracic spinal cord stimulator generator 08/18/14.     Lumbar spine films show  spinal cord generator in left flank area. 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  Follows with Rheumatology in [facility] of this note might be different from the original.  Myoadenylate deaminase deficiency, diagnosed back in 1984, after muscle   biopsies     Neutrophilic leukocytosis 08/12/2012   OAB (overactive bladder) 08/28/2023   Obesity (BMI 30-39.9) 01/01/2018   Osteoarthritis    Osteomalacia 04/23/2019    Osteoporosis, senile 11/15/2010   Other myositis, left thigh 04/24/2023   Pain management contract agreement 11/17/2016   Formatting of this note might be different from the original.  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  Getting iron infusion via port at hematalogy/oncology center at [facility].   Was recently hospitalized in [facility] for severe anemia     Formatting of this note might be different from the original.  on OTC b12 currently     Vitamin D  deficiency    Past Surgical History:  Procedure Laterality Date   ABDOMINAL HYSTERECTOMY     APPENDECTOMY     CARPAL TUNNEL RELEASE Bilateral 2023   with thumb joint replacement   CERVICAL FUSION     CHOLECYSTECTOMY     FOOT SURGERY Bilateral    fusion   GASTRIC BYPASS     IR EMBO ARTERIAL NOT HEMORR  HEMANG INC GUIDE ROADMAPPING  04/01/2024   IR RADIOLOGIST EVAL & MGMT  03/14/2024   IR RADIOLOGIST EVAL & MGMT  04/24/2024   SPINAL CORD STIMULATOR INSERTION  07/2023   TOTAL HIP ARTHROPLASTY Bilateral    11/2016 & 05/2017   Patient Active Problem List   Diagnosis Date Noted   Onychodystrophy 08/14/2024   Oral candidiasis 07/08/2024   Cellulitis of left foot 06/30/2024   Chronic bilateral hip pain after total replacement of both hip joints 06/18/2024   Left leg pain 06/18/2024   Mononeuropathy 12/14/2023   Palpitations 10/04/2023   Cardiac murmur 10/04/2023   Allergy    Anemia  Anxiety    B12 deficiency    Back pain    Chronic fatigue syndrome    Clotting disorder    Constipation    Edema of both lower extremities    Fatty liver    Fibromyalgia    Gallbladder problem    High cholesterol    History of blood clots    History of Roux-en-Y gastric bypass    Hypothyroidism    Infertility, female    Joint pain    Kidney stones    Muscle disorder    Osteoarthritis    Prediabetes    Salzmann nodular degeneration    SOBOE (shortness of breath on exertion)    Swallowing difficulty    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   Arthritis of scaphoid-trapezium-trapezoid joint of right hand 09/10/2023   Urge incontinence 08/28/2023   OAB (overactive bladder) 08/28/2023   Lumbar post-laminectomy syndrome 07/06/2023   Snapping hip syndrome, 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   Other myositis, left thigh 04/24/2023   Sacroiliitis, not elsewhere classified 04/24/2023   Dysphagia 04/17/2023   Elevated liver enzymes 04/02/2023   Elevated TSH 04/02/2023   Chronic throat clearing 03/14/2023   Globus sensation 03/14/2023   Hoarseness 03/14/2023   Muscle tension dysphonia 03/12/2023   Unspecified inflammatory spondylopathy, 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   Primary osteoarthritis of both knees 12/21/2021   Port-A-Cath in place 07/29/2021   Acquired hypothyroidism 07/29/2021   Chronic pain syndrome  07/29/2021   Vitamin B12 deficiency 07/29/2021   Vitamin D  deficiency 07/29/2021   Postmenopausal estrogen deficiency 07/29/2021   IBS (irritable bowel syndrome) 04/06/2021   Retention of urine 04/06/2021   Herniated lumbar intervertebral disc 11/12/2020   Skin sensation disturbance 11/12/2020   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   Spondylopathy, unspecified 11/12/2020   Trochanteric bursitis of both hips 11/19/2019   Low serum iron 04/23/2019   Osteomalacia 04/23/2019   Abnormality of gait 04/23/2019   History of bilateral hip arthroplasty 06/26/2018   Gastroesophageal reflux disease 06/18/2018   Hypertension 06/18/2018   Intertrigo 04/30/2018   Lentigines 04/30/2018   Hyponatremia 01/17/2018   History of cervical spinal surgery 01/01/2018   Obesity (BMI 30-39.9) 01/01/2018   SK (seborrheic keratosis) 04/18/2017   Pain management contract agreement 11/17/2016   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   Allergic rhinitis due to animal hair and dander 12/02/2015   Low back pain 03/03/2015   Incontinence 10/30/2013   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   Neutrophilic leukocytosis 08/12/2012   Thrombocytosis 08/12/2012   Osteoporosis, senile 11/15/2010   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   Hyperlipidemia 08/19/2010   Impaired fasting glucose 08/19/2010   Polyarthritis 09/28/2009   Degenerative disc disease 04/07/2009   Depression with anxiety 04/07/2009   Myopathy 04/07/2009   Insomnia 04/07/2009   Migraine 04/07/2009   Scoliosis 04/07/2009   Seasonal allergies 04/07/2009   Vitamin B12 deficiency anemia 04/07/2009    PCP: No PCP  REFERRING PROVIDER: Zada Palin, NP  REFERRING DIAG: M45 Ankylosing Spondylitis of cervical region M50.11 Cervical radiculopathy M79.7 Fibromyalgia  THERAPY DIAG:  Cervicalgia  Muscle weakness (generalized)  Rationale for Evaluation and Treatment: Rehabilitation  ONSET DATE: November 10   SUBJECTIVE:  SUBJECTIVE STATEMENT: Patient reports that she is some better. Notes that she could turn her head a bit better when driving. Can feel a difference in movement and how muscles feel.   Hand dominance: Left  PERTINENT HISTORY:  See above  Spinal stimulator C1-C2 fusion   Pt reports she was supposed to have knee surgery in August, however the week before, she was diagnosed for having cellulitis in left leg, requiring hospitalization. Around the same time, pt also needed to have and was approved having C1-C2 fusion surgery on Nov 10, 25. She feels very tight on the back of the neck muscles, stating she cannot open her mouth to let food in all the way, and has difficulty chewing food. Reports staying 8 days in inpatient rehab after surgery. Staples removed 3 days ago. At this time her knee surgery is cancelled, and focus is on rehabilitating the neck.   PAIN:  Are you having pain? Yes: NPRS scale: 5-6/10 Pain location: back of neck , bil upper traps, upper anterior thoracic  Pain description: constant, throbbing, sharp, tight, electric  Aggravating factors: everything hurts Relieving factors: muscle relxer , pain meds   PRECAUTIONS: Other: Spinal stimulator   RED FLAGS: None     WEIGHT BEARING RESTRICTIONS: No  FALLS:  Has patient fallen in last 6 months? No  LIVING ENVIRONMENT: Lives with: lives alone Lives in: House/apartment Stairs: Yes: External: 4 steps; can reach both Has following equipment at home: None  OCCUPATION: Retired   PLOF: Independent  PATIENT GOALS: To move my neck more, less tightness, get the rocks out of my neck and head  NEXT MD VISIT: mid Jan 2026  OBJECTIVE:  Note: Objective measures were completed at Evaluation unless otherwise noted.  DIAGNOSTIC FINDINGS:  None on file  PATIENT SURVEYS:  NDI:  62% (31/50)  COGNITION: Overall cognitive status: Within functional limits for tasks assessed  SENSATION: Not tested  POSTURE: rounded shoulders and forward head  PALPATION: Tender to palpate at bilateral upper traps Lt>Rt Tender:  -Bil lev scap Lt>Rt -suboccipitals - Bil Scalenes -Bil upper anterior thoracic region   CERVICAL ROM:   Active ROM A/PROM (deg) eval  Flexion 20 pain  Extension 15 pain  Right lateral flexion 10 pain  Left lateral flexion 30 pain  Right rotation 15 pain  Left rotation 11 pain   (Blank rows = not tested)  UPPER EXTREMITY ROM:  Active ROM Right eval Left eval  Shoulder flexion 130 145  Shoulder extension [facility]  Shoulder abduction [facility]  Shoulder adduction    Shoulder extension    Shoulder internal rotation [facility]  Shoulder external rotation [facility] WFL  Elbow flexion    Elbow extension    Wrist flexion    Wrist extension    Wrist ulnar deviation    Wrist radial deviation    Wrist pronation    Wrist supination     (Blank rows = not tested)  UPPER EXTREMITY MMT:  MMT Right eval Left eval  Shoulder flexion 4- 4  Shoulder extension    Shoulder abduction 4+ 4+  Shoulder adduction    Shoulder extension 4 4  Shoulder internal rotation 4+ 4+  Shoulder external rotation 4+ 4+  Middle trapezius    Lower trapezius    Elbow flexion 4 4  Elbow extension 4 4  Wrist flexion 4+ 4+  Wrist extension 4 4  Wrist ulnar deviation    Wrist radial deviation    Wrist pronation    Wrist  supination    Grip strength     (Blank rows = not tested)   FUNCTIONAL TESTS:  5 times sit to stand: 19.32 sec   TREATMENT DATE:  [facility] Adult PT Treatment:                                                DATE: 11/06/24  Manual Therapy: STM cervical paraspinals, bilat UT, suboccipitals STM/TPR through pecs; upper traps Desentization for area of surgery; scalp; scar Gentle ROM c spine to tolerance Accupressure headache points  Neuromuscular  re-ed: Supine and sitting cervical rotation, cervical nods to patient tolerance Therapeutic Activity: Chin tuck in supine gentle 5 sec x 10  Posterior shoulder rolls sitting x 10 Scap squeeze sitting x 10 Self Care: Incision with no redness or drainage. Appears closed at this time  [facility] Adult PT Treatment:                                                DATE: 11/04/24  Manual Therapy: STM cervical paraspinals, bilat UT, suboccipitals Gentle ROM c spine to tolerance Neuromuscular re-ed: Head on coregeous ball: cervical rotation, cervical nods Therapeutic Activity: Posterior shoulder rolls x 10 Scap squeeze x 10 Shoulder elevation/depression x 10 Hug a tree 2 x 8  Modalities: Moist heat cervical x 10 min Self Care: Incision with no redness or drainage. Appears closed at this time  [facility] Adult PT Treatment:                                                DATE: 10/29/24 Therapeutic Exercise: See HEP                                                                                                                                  PATIENT EDUCATION:  Education details: HEP and POC Person educated: Patient Education method: Explanation, Demonstration, Tactile cues, Verbal cues, and Handouts Education comprehension: verbalized understanding, returned demonstration, verbal cues required, tactile cues required, and needs further education  HOME EXERCISE PROGRAM: Access Code: [facility] URL: [email or web address] Date: 10/29/2024 Prepared by: Lavanda Cleverly  Exercises - Seated Shoulder Shrugs  - 1 x daily - 7 x weekly - 3 sets - 10 reps - Beginner Hug a Tree  - 1 x daily - 7 x weekly - 3 sets - 10 reps - Seated Cervical Sidebending AROM  - 1 x daily - 7 x weekly - 3 sets - 10 reps  ASSESSMENT:  CLINICAL IMPRESSION: Patient reports improved movement in neck following initial visit  and today's treatment. Patient experienced some dizziness upon returning to sit following  manual work which resolved with sitting and water. Patient continued with gentle ROM and postural exercises without difficulty. Positive response to treatment and home program.   OBJECTIVE IMPAIRMENTS: Abnormal gait, decreased activity tolerance, decreased balance, decreased coordination, decreased endurance, decreased mobility, decreased ROM, decreased strength, hypomobility, increased fascial restrictions, increased muscle spasms, impaired flexibility, impaired UE functional use, improper body mechanics, postural dysfunction, and pain.    GOALS: Goals reviewed with patient? Yes  SHORT TERM GOALS: Target date: 11/29/24  Pt will be efficient with initial HEP so she has increased independence for everyday activities.  Baseline:  Goal status: INITIAL  2.  Pt will report no more than 6/10 neck pain so she has increased functional capacity for driving safely Baseline: 1/89 Goal status: INITIAL  3.  Pt will improve 5 sit to stands test by completing in 14 sec or less so she has increased strength and mobility for community services Baseline:  Goal status: INITIAL    LONG TERM GOALS: Target date: 12/24/24  Pt will be efficient with advanced HEP so she has increased independence for everyday activities.  Baseline:  Goal status: INITIAL  2.  Pt will be able to increase cervical flexion and extension ROM at least 10 deg or more so she has increased functional capacity for everyday ADL's (ex: reading,etc) Baseline: flexion 20 deg, ext 15 deg  Goal status: INITIAL  3.  Pt will improve NDI survey by scoring at least 10 % improvement so she can lift objects for ADL's safely Baseline: 62% (31/50) Goal status: INITIAL     PLAN:  PT FREQUENCY: 2x/week  PT DURATION: 8 weeks  PLANNED INTERVENTIONS: 97164- PT Re-evaluation, 97750- Physical Performance Testing, 97110-Therapeutic exercises, 97530- Therapeutic activity, 97112- Neuromuscular re-education, 97535- Self Care, [id number]- Manual therapy,  [phone]- Gait training, [phone]- Aquatic Therapy, [phone] (1-2 muscles), [id number] (3+ muscles)- Dry Needling, Patient/Family education, Balance training, Stair training, Joint mobilization, Spinal mobilization, Cryotherapy, and Moist heat  PLAN FOR NEXT SESSION: neck stretches as tolerated, review HEP, deep neck flexor strengthening, median, ulnar, radial nerve mobilization, cervical flexion AROM   Johnston Maddocks P Coady Train, PT 11/06/2024, 11:04 AM

## 2024-11-06 NOTE — Progress Notes (Signed)
 [facility] Counselor/Therapist Progress Note  Patient ID: Heather Thompson, MRN: [id number],    Date: 11/06/2024  Time Spent:  50 minutes 1105-1155am  Treatment Type: Individual Therapy  Risk Assessment: Danger to Self:  No Self-injurious Behavior: No Danger to Others: No  Subjective: This session was held via video teletherapy. The patient consented to audio teletherapy and was located at [facility] for this session. She is aware it is the responsibility of the patient to secure confidentiality on her end of the session. The provider was in a private office for the duration of this session.    The patient arrived on time for her in person session.  Issues addressed: 1-physical -pt just had PT -pt feeling much better -still has limited ROM of neck but feels good overall -has experienced increased strength and flexibility since the surgery and initial of PT 2-Christ[place] in [place] with [place] and son -pt wishes she was only going to be there for a day because she knows she will be by herself most of the time -discussed with pt importance of talking with her daughter about things you would like to do while in [place] -confronted pt on having a pity party for herself when all that she  heeds to do is let her daughter know that she wants to participate and enjoy her time. -pt accepted responsibility for seeing things as negative and admits this has always been a barrier  Treatment Plan Problems: Financial Stress, Low Self-Esteem, Attention Deficit Disorder (ADD) - Adult Symptoms: Unable to concentrate or pay attention to things of low interest, even when those things are important to his/her life. Restless and fidgety; unable to be sedentary for more than a short time. Disorganized in most areas of his/her life. Starts many projects but rarely finishes any. Chronic low self-esteem. Tendency toward addictive behaviors. Indebtedness and overdue bills that exceed ability  to meet monthly payments. A long-term lack of discipline in money management that has led to excessive indebtedness. A pattern of impulsive spending that does not consider the eventual financial consequences. Inability to accept compliments. Makes self-disparaging remarks; sees self as unattractive, worthless, a loser, a burden, unimportant; takes blame easily. Difficulty in saying no to others; assumes not being liked by others. Fear of rejection by others, especially peer group. Lack of any goals for life and setting of inappropriately low goals for self. Inability to identify positive characteristics of self. Anxious and uncomfortable in social situations. Goals: Reduce impulsive actions while increasing concentration and focus on low-interest activities. Minimize ADD behavioral interference in daily life. Sustain attention and concentration for consistently longer periods of time. Achieve an inner strength to control personal impulses, cravings, and desires that directly or indirectly increase debt irresponsibly. Resolve financial crisis with a path to eliminate debt. Revise spending patterns to not exceed income. Gain a new sense of self-worth in which the substance of one's value is not attached to the capacity to do things or own things that cost money. Understand personal desires, insecurities, and anxieties that make overspending possible. Elevate self-esteem. Develop a consistent, positive self-image. Demonstrate improved self-esteem through more pride in appearance, more assertiveness, greater eye contact, and identification of positive traits in self-talk messages. Establish an inward sense of self-worth, confidence, and competence. Interact socially without undue distress or disability. Objectives target date for all objectives is 03/25/2025: Identify the current specific ADD behaviors that cause the most difficulty. List the negative consequences of the ADD problematic  behavior. Learn and implement organization  and planning skills. Acknowledge procrastination and the need to reduce it. Learn and implement skills to reduce procrastination. Learn and implement skills to reduce the disruptive influence of distractibility. Combine skills learned in therapy into a new daily approach to managing ADHD. Use cognitive and behavioral strategies to control the impulse to make unnecessary and unaffordable purchases. Report instances of successful control over impulse to spend on unnecessary expenses. Increase insight into the historical and current sources of low self-esteem. Decrease the frequency of negative self-descriptive statements and increase frequency of positive self-descriptive statements. Articulate a plan to be proactive in trying to get identified needs met. Form realistic, appropriate, and attainable goals for self in all areas of life. Take verbal responsibility for accomplishments without discounting. Identify and replace negative self-talk messages used to reinforce low self-esteem. Objectives target date for all objectives is 03/13/2025: Identify the specific anxiety symptoms that are personally most disturbing or most contributing to impaired functioning. Learn and practice thought and behavioral control methods to minimize and control anxiety symptoms once they have begun. Identify specific thoughts that precipitate anxiety symptoms. Replace anxiety-producing thoughts with constructive thoughts. Verbalize an understanding of the general physical and cognitive manifestations of the causes for anxiety. Identify and clarify the patterns of anxiety precipitants and consequences. Identify whether the symptoms of depression seem to be primarily related to interpersonal relationships, stressful life events or circumstances, thoughts/beliefs, or behaviors. Verbalize an understanding of the general physical and psychological effects of depression. Replace  depression-promoting thoughts with mood-elevating thoughts. Keep a daily record of mood rating from 1 to 10, noting associated behaviors, activities, events, people, and thoughts. Identify specific thoughts that precipitate depressive moods. Monitor and report depressive symptoms, completing self-report assessment on a periodic basis. Describe and evaluate significant past and current interpersonal relationships. Tell memories of the deceased person, starting with positive memories. Consider possible ways of increasing involvement with others. Accept the reality of loss and tolerate the pain of intense grief. Engage in events and activities that increase level of social involvements. Identify and monitor specific pain triggers. Learn and implement somatic skills such as relaxation and/or biofeedback to reduce pain level. Learn mental coping skills and implement with somatic skills for managing acute pain. Interventions: Reinforce the client's belief system and/or refer to a spiritual leader who can support the client's faith. Explore and problem-solve with the client difficulties, fears, or barriers related to increasing social involvement. Point out to the client that engagement with others is associated with decreased feelings of loneliness, grief, depression, and hopelessness. Provide encouragement and support to the client for increased engagement in contact with others. Clarify with the client that expression of negative thoughts or feelings about the deceased is not disloyal. Assist the client in remembering the loved one in realistic ways (i.e., includes attractions, positive traits, negative traits, conflicts, ambivalence, dependency, etc.), a memory that is neither idealized nor demonized, so that the client's relationship with the deceased person can be appraised realistically (or assign Dear _____: A Letter to a Lost Loved One in the Adult Psychotherapy Homework Planner, 2nd ed. by  Jenniffer). Assign the client to read about progressive muscle relaxation and other calming strategies in relevant books or treatment manuals (e.g., Progressive Relaxation Training by Thornell armin Collier). Assign a homework exercise in which the client implements somatic pain management skills and records the result; review and process during the treatment session. Teach the client distraction techniques (e.g., pleasant imagery, counting techniques, alternative focal points) and how to use them for relaxation  skills for the management of acute episodes of pain. Ask the client to create a list of activities that are pleasurable to him/her (or assign Identify and Schedule Pleasant Activities in the Adult Psychotherapy Homework Planner, 2nd ed. by Jenniffer); process the list, developing a plan of increasing the frequency of engaging in the selected pleasurable activities. Teach the client problem-solving skills to apply to removal of obstacles to implementing new skills; role-play implementation of these skills to obstacles. Assist the client in identifying the most effective personal stress management techniques (e.g., prayer, walking, baking, telephoning a friend), and encourage daily scheduling of these activities (or assign Past Successful Anxiety Coping in the Adult Psychotherapy Homework Planner, 2nd ed. by Jenniffer). Teach the client the concept of finding a good match between the individual's capacities and the demands of the physical environment. If the physical environment is too demanding for the individual's capacities (e.g., frail older adult taking care of a large house), the individual can become overwhelmed and may need to move from large home to smaller accommodations or residence for older adults). While reviewing the client's anxiety symptom chart, help him/her recognize patterns associated with anxiety symptoms: sort out precipitants from consequences, identify the most intense or  frequent precipitants, and identify the consequences that help to perpetuate maladaptive patterns. Assist the client in identifying his/her current attempts to reducing anxiety (e.g., constantly telephoning family or physician, making unneeded doctor appointments or going to the emergency room), their apparent positive consequences (e.g., feeling better, getting attention), but longer-term negative consequences (e.g., physician won't return calls, friends avoid the client because of expressions of worry). Teach the client that his/her mood can be improved by increasing pleasant events and decreasing unpleasant events. Encourage the client to identify pleasant events that are desirable, but are not currently a part of his/her daily routine (see Identify and Schedule Pleasant Activities in the Adult Psychotherapy Homework Planner, 2nd ed. by Jenniffer). Develop a one-week daily schedule with the client that increases pleasant events and decreases unpleasant events, making sure to have at least one pleasant event every day. Help the client to understand his/her strengths and weaknesses in problem-solving, to view problems as challenges and not personal deficits, and to identify and define current problem(s) that will be the focus of treatment. Help the client to identify realistic goals to address problem(s) of concern and identify major obstacles in achieving goals. Encourage the client to implement chosen solutions to current life problem(s), self-monitor and evaluate solution implementation, and reward self for efforts. Explore the relationship of negative self-talk (e.g., I'm just a burden to everyone; Everyone would be better off if I were dead) and distorted beliefs (e.g., There's no one like me living here; I'd rather never leave my room than have anyone see me in a wheelchair) to depressed mood (or assign Negative Thoughts Trigger Negative Feelings in the Adult Psychotherapy Homework Planner,  2nd ed. by Jenniffer; or Daily Record of Dysfunctional Thoughts in Cognitive Therapy of Depression by Almarie Candida Gentry and Shona). Encourage the client to distinguish between all-or-nothing thinking (e.g., Life in a nursing home can't be worth living) and genuine nuanced reflection on life's meaning and purpose (e.g., What's giving my life meaning at this stage?); confront the former, encourage the latter.  Diagnosis:Major depressive disorder, recurrent episode, moderate (HCC)  Generalized anxiety disorder  Grief  Plan:  -talk with son and daughter and let them know what you want to do while visiting -pt is to avoid pouting behaviors -meet again on Wednesday,  December 01, 2024 at 12pm online

## 2024-11-07 ENCOUNTER — Other Ambulatory Visit: Payer: Self-pay | Admitting: Medical-Surgical

## 2024-11-11 ENCOUNTER — Encounter: Payer: Self-pay | Admitting: Physical Therapy

## 2024-11-11 ENCOUNTER — Ambulatory Visit: Admitting: Physical Therapy

## 2024-11-11 DIAGNOSIS — M542 Cervicalgia: Secondary | ICD-10-CM | POA: Diagnosis not present

## 2024-11-11 DIAGNOSIS — M6281 Muscle weakness (generalized): Secondary | ICD-10-CM

## 2024-11-11 NOTE — Therapy (Signed)
 OUTPATIENT PHYSICAL THERAPY CERVICAL TREATMENT   Patient Name: Heather Thompson MRN: [id number] DOB:13-Aug-1951, 73 y.o., female Today's Date: 11/11/2024  END OF SESSION:  PT End of Session - 11/11/24 1359     Visit Number 4    Number of Visits 17    Date for Recertification  12/24/24    Authorization Type Medicare Part A and B    Authorization - Visit Number 3    Progress Note Due on Visit 10    PT Start Time 1315    PT Stop Time 1400    PT Time Calculation (min) 45 min    Activity Tolerance Patient tolerated treatment well    Behavior During Therapy WFL for tasks assessed/performed            Past Medical History:  Diagnosis Date   Abnormality of gait 04/23/2019   Acquired hypothyroidism 07/29/2021   Allergic rhinitis due to animal hair and dander 12/02/2015   Formatting of this note might be different from the original.  [facility]  Seen by Dr. Leita 11/11/15     Plan- Received allergy shots today. Follow up 1 year.     Allergy    Anemia    Anemia    Anxiety    Arthritis of scaphoid-trapezium-trapezoid joint of right hand 09/10/2023   B12 deficiency    Back pain    Chronic fatigue 07/29/2021   Chronic fatigue syndrome    Chronic throat clearing 03/14/2023   Clotting disorder    Constipation    Depression with anxiety 04/07/2009   Formatting of this note might be different from the original.  well controlled on current meds, follows with psychiatrist in [place]. Had a   h/o severe depression 5 years ago, and had ECT.     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  B12 deficiency   Iron deficiency     Herniated lumbar intervertebral disc  11/12/2020   High cholesterol    History of blood clots    History of cervical spinal surgery 01/01/2018   Formatting of this note might be different from the original.  Fusion C6 and C7 per 2018 MRI report.     History of left hip replacement 01/14/2018   History of Roux-en-Y gastric bypass    Hoarseness 03/14/2023   Hyperlipidemia 08/19/2010   Hypertension    Hyponatremia 01/17/2018   Hypothyroidism    Hypothyroidism    IBS (irritable bowel syndrome) 04/06/2021   Impaired fasting glucose 08/19/2010   Incontinence 10/30/2013   Formatting of this note might be different from the original. Formatting of this note might be different from the original. Urinary Incontinence     Infertility, female    Insomnia 04/07/2009   Formatting of this note might be different from the original.  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  Followed by [facility]  Seen by Dr. Linnette 03/01/15  S/p changing thoracic spinal cord stimulator generator 08/18/14.     Lumbar spine films show spinal cord generator in left flank area. 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  Follows with Rheumatology in [facility] of this note might be different from the original.  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  Getting iron infusion via port at hematalogy/oncology center at [facility].   Was recently hospitalized in [facility] for severe anemia     Formatting of this note might be different from the original.  on OTC b12 currently     Vitamin D  deficiency    Past Surgical History:  Procedure Laterality Date   ABDOMINAL HYSTERECTOMY     APPENDECTOMY     CARPAL TUNNEL RELEASE Bilateral 2023   with thumb joint replacement   CERVICAL FUSION      CHOLECYSTECTOMY     FOOT SURGERY Bilateral    fusion   GASTRIC BYPASS     IR EMBO ARTERIAL NOT HEMORR HEMANG INC GUIDE ROADMAPPING  04/01/2024   IR RADIOLOGIST EVAL & MGMT  03/14/2024   IR RADIOLOGIST EVAL & MGMT  04/24/2024   SPINAL CORD STIMULATOR INSERTION  07/2023   TOTAL HIP ARTHROPLASTY Bilateral    11/2016 & 05/2017   Patient Active Problem List   Diagnosis Date Noted   Onychodystrophy 08/14/2024   Oral candidiasis 07/08/2024   Cellulitis of left foot 06/30/2024   Chronic bilateral hip pain after total replacement of both hip joints 06/18/2024   Left leg pain 06/18/2024   Mononeuropathy 12/14/2023   Palpitations  10/04/2023   Cardiac murmur 10/04/2023   Allergy    Anemia    Anxiety    B12 deficiency    Back pain    Chronic fatigue syndrome    Clotting disorder    Constipation    Edema of both lower extremities    Fatty liver    Fibromyalgia    Gallbladder problem    High cholesterol    History of blood clots    History of Roux-en-Y gastric bypass    Hypothyroidism    Infertility, female    Joint pain    Kidney stones    Muscle disorder    Osteoarthritis    Prediabetes    Salzmann nodular degeneration    SOBOE (shortness of breath on exertion)    Swallowing difficulty    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   Arthritis of scaphoid-trapezium-trapezoid joint of right hand 09/10/2023   Urge incontinence 08/28/2023   OAB (overactive bladder) 08/28/2023   Lumbar post-laminectomy syndrome 07/06/2023   Snapping hip syndrome, 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   Other myositis, left thigh 04/24/2023   Sacroiliitis, not elsewhere classified 04/24/2023   Dysphagia 04/17/2023   Elevated liver enzymes 04/02/2023   Elevated TSH 04/02/2023   Chronic throat clearing 03/14/2023   Globus sensation 03/14/2023   Hoarseness 03/14/2023   Muscle tension dysphonia 03/12/2023   Unspecified inflammatory spondylopathy, 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   Primary osteoarthritis of both knees  12/21/2021   Port-A-Cath in place 07/29/2021   Acquired hypothyroidism 07/29/2021   Chronic pain syndrome 07/29/2021   Vitamin B12 deficiency 07/29/2021   Vitamin D  deficiency 07/29/2021   Postmenopausal estrogen deficiency 07/29/2021   IBS (irritable bowel syndrome) 04/06/2021   Retention of urine 04/06/2021   Herniated lumbar intervertebral disc 11/12/2020   Skin sensation disturbance 11/12/2020   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   Spondylopathy, unspecified 11/12/2020   Trochanteric bursitis of both hips 11/19/2019   Low serum iron 04/23/2019   Osteomalacia 04/23/2019   Abnormality of gait 04/23/2019   History of bilateral hip arthroplasty 06/26/2018   Gastroesophageal reflux disease 06/18/2018   Hypertension 06/18/2018   Intertrigo 04/30/2018   Lentigines 04/30/2018   Hyponatremia 01/17/2018   History of cervical spinal surgery 01/01/2018   Obesity (BMI 30-39.9) 01/01/2018   SK (seborrheic keratosis) 04/18/2017   Pain management contract agreement 11/17/2016   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   Allergic rhinitis due to animal hair and dander 12/02/2015   Low back pain 03/03/2015   Incontinence 10/30/2013   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   Neutrophilic leukocytosis 08/12/2012   Thrombocytosis 08/12/2012   Osteoporosis, senile 11/15/2010   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   Hyperlipidemia 08/19/2010   Impaired fasting glucose 08/19/2010   Polyarthritis 09/28/2009   Degenerative disc disease 04/07/2009   Depression with anxiety 04/07/2009   Myopathy 04/07/2009   Insomnia 04/07/2009   Migraine 04/07/2009   Scoliosis 04/07/2009   Seasonal allergies 04/07/2009   Vitamin B12 deficiency anemia 04/07/2009    PCP: No PCP  REFERRING PROVIDER: Zada Palin, NP  REFERRING DIAG: M45 Ankylosing Spondylitis of cervical region M50.11 Cervical radiculopathy M79.7 Fibromyalgia  THERAPY DIAG:  Cervicalgia  Muscle weakness (generalized)  Rationale for Evaluation and  Treatment: Rehabilitation  ONSET DATE: November 10   SUBJECTIVE:  SUBJECTIVE STATEMENT: Pt states she feels she has more lumps and bumps. She said she almost called the doctor due to increased lump on the Lt side of her neck. This lump has decreased in size  Hand dominance: Left  PERTINENT HISTORY:  See above  Spinal stimulator C1-C2 fusion   Pt reports she was supposed to have knee surgery in August, however the week before, she was diagnosed for having cellulitis in left leg, requiring hospitalization. Around the same time, pt also needed to have and was approved having C1-C2 fusion surgery on Nov 10, 25. She feels very tight on the back of the neck muscles, stating she cannot open her mouth to let food in all the way, and has difficulty chewing food. Reports staying 8 days in inpatient rehab after surgery. Staples removed 3 days ago. At this time her knee surgery is cancelled, and focus is on rehabilitating the neck.   PAIN:  Are you having pain? Yes: NPRS scale: 5-6/10 Pain location: back of neck , bil upper traps, upper anterior thoracic  Pain description: constant, throbbing, sharp, tight, electric  Aggravating factors: everything hurts Relieving factors: muscle relxer , pain meds   PRECAUTIONS: Other: Spinal stimulator   RED FLAGS: None     WEIGHT BEARING RESTRICTIONS: No  FALLS:  Has patient fallen in last 6 months? No  LIVING ENVIRONMENT: Lives with: lives alone Lives in: House/apartment Stairs: Yes: External: 4 steps; can reach both Has following equipment at home: None  OCCUPATION: Retired   PLOF: Independent  PATIENT GOALS: To move my neck more, less tightness, get the rocks out of my neck and head  NEXT MD VISIT: mid Jan 2026  OBJECTIVE:  Note:  Objective measures were completed at Evaluation unless otherwise noted.  DIAGNOSTIC FINDINGS:  None on file  PATIENT SURVEYS:  NDI: 62% (31/50)  COGNITION: Overall cognitive status: Within functional limits for tasks assessed  SENSATION: Not tested  POSTURE: rounded shoulders and forward head  PALPATION: Tender to palpate at bilateral upper traps Lt>Rt Tender:  -Bil lev scap Lt>Rt -suboccipitals - Bil Scalenes -Bil upper anterior thoracic region   CERVICAL ROM:   Active ROM A/PROM (deg) eval  Flexion 20 pain  Extension 15 pain  Right lateral flexion 10 pain  Left lateral flexion 30 pain  Right rotation 15 pain  Left rotation 11 pain   (Blank rows = not tested)  UPPER EXTREMITY ROM:  Active ROM Right eval Left eval  Shoulder flexion 130 145  Shoulder extension [facility]  Shoulder abduction [facility]  Shoulder adduction    Shoulder extension    Shoulder internal rotation [facility]  Shoulder external rotation [facility] WFL  Elbow flexion    Elbow extension    Wrist flexion    Wrist extension    Wrist ulnar deviation    Wrist radial deviation    Wrist pronation    Wrist supination     (Blank rows = not tested)  UPPER EXTREMITY MMT:  MMT Right eval Left eval  Shoulder flexion 4- 4  Shoulder extension    Shoulder abduction 4+ 4+  Shoulder adduction    Shoulder extension 4 4  Shoulder internal rotation 4+ 4+  Shoulder external rotation 4+ 4+  Middle trapezius    Lower trapezius    Elbow flexion 4 4  Elbow extension 4 4  Wrist flexion 4+ 4+  Wrist extension 4 4  Wrist ulnar deviation    Wrist radial deviation    Wrist pronation  Wrist supination    Grip strength     (Blank rows = not tested)   FUNCTIONAL TESTS:  5 times sit to stand: 19.32 sec   TREATMENT DATE:  [facility] Adult PT Treatment:                                                DATE: 11/11/24  Manual Therapy: STM, TPR cervical paraspinals, bilat UT, suboccipitals Gentle ROM c spine as  tolerated Neuromuscular re-ed: Head on coregeous ball: cervical rotation, cervical nods Cervical retraction as tolerated Therapeutic Activity: Posterior shoulder rolls sitting x 10 Scap squeeze sitting x 10 Discussed continued POC and recommendations for activity modifications during heat pack  Modalities: Heat pack cervical x 10 minutes end of session Self Care: Incision with no redness or drainage. Appears closed at this time  [facility] Adult PT Treatment:                                                DATE: 11/06/24  Manual Therapy: STM cervical paraspinals, bilat UT, suboccipitals STM/TPR through pecs; upper traps Desentization for area of surgery; scalp; scar Gentle ROM c spine to tolerance Accupressure headache points  Neuromuscular re-ed: Supine and sitting cervical rotation, cervical nods to patient tolerance Therapeutic Activity: Chin tuck in supine gentle 5 sec x 10  Posterior shoulder rolls sitting x 10 Scap squeeze sitting x 10 Self Care: Incision with no redness or drainage. Appears closed at this time  [facility] Adult PT Treatment:                                                DATE: 11/04/24  Manual Therapy: STM cervical paraspinals, bilat UT, suboccipitals Gentle ROM c spine to tolerance Neuromuscular re-ed: Head on coregeous ball: cervical rotation, cervical nods Therapeutic Activity: Posterior shoulder rolls x 10 Scap squeeze x 10 Shoulder elevation/depression x 10 Hug a tree 2 x 8  Modalities: Moist heat cervical x 10 min Self Care: Incision with no redness or drainage. Appears closed at this time                                                                                                                                 PATIENT EDUCATION:  Education details: HEP and POC Person educated: Patient Education method: Explanation, Demonstration, Tactile cues, Verbal cues, and Handouts Education comprehension: verbalized understanding, returned  demonstration, verbal cues required, tactile cues required, and needs further education  HOME EXERCISE PROGRAM: Access Code: [facility] URL: [email or web address] Date: 10/29/2024 Prepared by: Lavanda Cleverly  Exercises - Seated Shoulder Shrugs  - 1 x daily - 7 x weekly - 3 sets - 10 reps - Beginner Hug a Tree  - 1 x daily - 7 x weekly - 3 sets - 10 reps - Seated Cervical Sidebending AROM  - 1 x daily - 7 x weekly - 3 sets - 10 reps  ASSESSMENT:  CLINICAL IMPRESSION: Pt continues to respond well to manual work and gentle ROM. She is better able to tolerate exercises. Continued to educate pt on performing gentle ROM as tolerated throughout the day  OBJECTIVE IMPAIRMENTS: Abnormal gait, decreased activity tolerance, decreased balance, decreased coordination, decreased endurance, decreased mobility, decreased ROM, decreased strength, hypomobility, increased fascial restrictions, increased muscle spasms, impaired flexibility, impaired UE functional use, improper body mechanics, postural dysfunction, and pain.    GOALS: Goals reviewed with patient? Yes  SHORT TERM GOALS: Target date: 11/29/24  Pt will be efficient with initial HEP so she has increased independence for everyday activities.  Baseline:  Goal status: INITIAL  2.  Pt will report no more than 6/10 neck pain so she has increased functional capacity for driving safely Baseline: 1/89 Goal status: INITIAL  3.  Pt will improve 5 sit to stands test by completing in 14 sec or less so she has increased strength and mobility for community services Baseline:  Goal status: INITIAL    LONG TERM GOALS: Target date: 12/24/24  Pt will be efficient with advanced HEP so she has increased independence for everyday activities.  Baseline:  Goal status: INITIAL  2.  Pt will be able to increase cervical flexion and extension ROM at least 10 deg or more so she has increased functional capacity for everyday ADL's (ex:  reading,etc) Baseline: flexion 20 deg, ext 15 deg  Goal status: INITIAL  3.  Pt will improve NDI survey by scoring at least 10 % improvement so she can lift objects for ADL's safely Baseline: 62% (31/50) Goal status: INITIAL     PLAN:  PT FREQUENCY: 2x/week  PT DURATION: 8 weeks  PLANNED INTERVENTIONS: 97164- PT Re-evaluation, 97750- Physical Performance Testing, 97110-Therapeutic exercises, 97530- Therapeutic activity, 97112- Neuromuscular re-education, 97535- Self Care, [id number]- Manual therapy, [id number]- Gait training, [phone]- Aquatic Therapy, [phone] (1-2 muscles), [id number] (3+ muscles)- Dry Needling, Patient/Family education, Balance training, Stair training, Joint mobilization, Spinal mobilization, Cryotherapy, and Moist heat  PLAN FOR NEXT SESSION: UPDATE HEP,neck stretches as tolerated, review HEP, deep neck flexor strengthening, median, ulnar, radial nerve mobilization, cervical flexion AROM   Rakia Frayne, PT 11/11/2024, 2:03 PM

## 2024-11-13 ENCOUNTER — Encounter: Payer: Self-pay | Admitting: Rehabilitative and Restorative Service Providers"

## 2024-11-13 ENCOUNTER — Ambulatory Visit: Admitting: Professional

## 2024-11-13 ENCOUNTER — Ambulatory Visit: Admitting: Rehabilitative and Restorative Service Providers"

## 2024-11-13 DIAGNOSIS — M542 Cervicalgia: Secondary | ICD-10-CM

## 2024-11-13 DIAGNOSIS — M6281 Muscle weakness (generalized): Secondary | ICD-10-CM

## 2024-11-13 NOTE — Therapy (Signed)
 OUTPATIENT PHYSICAL THERAPY CERVICAL TREATMENT   Patient Name: Heather Thompson MRN: [id number] DOB:[birth year], 73 y.o., female Today's Date: 11/13/2024  END OF SESSION:  PT End of Session - 11/13/24 1105     Visit Number 5    Number of Visits 17    Date for Recertification  12/24/24    Authorization Type Medicare Part A and B    Authorization - Visit Number 5    Progress Note Due on Visit 10    PT Start Time 1100    PT Stop Time 1145    PT Time Calculation (min) 45 min    Activity Tolerance Patient tolerated treatment well            Past Medical History:  Diagnosis Date   Abnormality of gait 04/23/2019   Acquired hypothyroidism 07/29/2021   Allergic rhinitis due to animal hair and dander 12/02/2015   Formatting of this note might be different from the original.  [facility]  Seen by Dr. Leita 11/11/15     Plan- Received allergy shots today. Follow up 1 year.     Allergy    Anemia    Anemia    Anxiety    Arthritis of scaphoid-trapezium-trapezoid joint of right hand 09/10/2023   B12 deficiency    Back pain    Chronic fatigue 07/29/2021   Chronic fatigue syndrome    Chronic throat clearing 03/14/2023   Clotting disorder    Constipation    Depression with anxiety 04/07/2009   Formatting of this note might be different from the original.  well controlled on current meds, follows with psychiatrist in [place]. Had a   h/o severe depression 5 years ago, and had ECT.     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  B12 deficiency   Iron deficiency     Herniated lumbar intervertebral disc 11/12/2020   High cholesterol    History of blood clots     History of cervical spinal surgery 01/01/2018   Formatting of this note might be different from the original.  Fusion C6 and C7 per 2018 MRI report.     History of left hip replacement 01/14/2018   History of Roux-en-Y gastric bypass    Hoarseness 03/14/2023   Hyperlipidemia 08/19/2010   Hypertension    Hyponatremia 01/17/2018   Hypothyroidism    Hypothyroidism    IBS (irritable bowel syndrome) 04/06/2021   Impaired fasting glucose 08/19/2010   Incontinence 10/30/2013   Formatting of this note might be different from the original. Formatting of this note might be different from the original. Urinary Incontinence     Infertility, female    Insomnia 04/07/2009   Formatting of this note might be different from the original.  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  Followed by [facility]  Seen by Dr. Linnette 03/01/15     S/p changing thoracic spinal cord stimulator generator  08/18/14.     Lumbar spine films show spinal cord generator in left flank area. 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  Follows with Rheumatology in [facility] of this note might be different from the original.  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  Getting iron infusion via port at hematalogy/oncology center at [facility].   Was recently hospitalized in [facility] for severe anemia     Formatting of this note might be different from the original.  on OTC b12 currently     Vitamin D  deficiency    Past Surgical History:  Procedure Laterality Date   ABDOMINAL HYSTERECTOMY     APPENDECTOMY     CARPAL TUNNEL RELEASE Bilateral 2023   with thumb joint replacement   CERVICAL FUSION     CHOLECYSTECTOMY     FOOT SURGERY Bilateral    fusion   GASTRIC BYPASS     IR  EMBO ARTERIAL NOT HEMORR HEMANG INC GUIDE ROADMAPPING  04/01/2024   IR RADIOLOGIST EVAL & MGMT  03/14/2024   IR RADIOLOGIST EVAL & MGMT  04/24/2024   SPINAL CORD STIMULATOR INSERTION  07/2023   TOTAL HIP ARTHROPLASTY Bilateral    11/2016 & 05/2017   Patient Active Problem List   Diagnosis Date Noted   Onychodystrophy 08/14/2024   Oral candidiasis 07/08/2024   Cellulitis of left foot 06/30/2024   Chronic bilateral hip pain after total replacement of both hip joints 06/18/2024   Left leg pain 06/18/2024   Mononeuropathy 12/14/2023   Palpitations 10/04/2023   Cardiac murmur 10/04/2023  Allergy    Anemia    Anxiety    B12 deficiency    Back pain    Chronic fatigue syndrome    Clotting disorder    Constipation    Edema of both lower extremities    Fatty liver    Fibromyalgia    Gallbladder problem    High cholesterol    History of blood clots    History of Roux-en-Y gastric bypass    Hypothyroidism    Infertility, female    Joint pain    Kidney stones    Muscle disorder    Osteoarthritis    Prediabetes    Salzmann nodular degeneration    SOBOE (shortness of breath on exertion)    Swallowing difficulty    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   Arthritis of scaphoid-trapezium-trapezoid joint of right hand 09/10/2023   Urge incontinence 08/28/2023   OAB (overactive bladder) 08/28/2023   Lumbar post-laminectomy syndrome 07/06/2023   Snapping hip syndrome, 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   Other myositis, left thigh 04/24/2023   Sacroiliitis, not elsewhere classified 04/24/2023   Dysphagia 04/17/2023   Elevated liver enzymes 04/02/2023   Elevated TSH 04/02/2023   Chronic throat clearing 03/14/2023   Globus sensation 03/14/2023   Hoarseness 03/14/2023   Muscle tension dysphonia 03/12/2023   Unspecified inflammatory spondylopathy, 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   Primary osteoarthritis of both knees 12/21/2021   Port-A-Cath in place 07/29/2021   Acquired hypothyroidism 07/29/2021    Chronic pain syndrome 07/29/2021   Vitamin B12 deficiency 07/29/2021   Vitamin D  deficiency 07/29/2021   Postmenopausal estrogen deficiency 07/29/2021   IBS (irritable bowel syndrome) 04/06/2021   Retention of urine 04/06/2021   Herniated lumbar intervertebral disc 11/12/2020   Skin sensation disturbance 11/12/2020   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   Spondylopathy, unspecified 11/12/2020   Trochanteric bursitis of both hips 11/19/2019   Low serum iron 04/23/2019   Osteomalacia 04/23/2019   Abnormality of gait 04/23/2019   History of bilateral hip arthroplasty 06/26/2018   Gastroesophageal reflux disease 06/18/2018   Hypertension 06/18/2018   Intertrigo 04/30/2018   Lentigines 04/30/2018   Hyponatremia 01/17/2018   History of cervical spinal surgery 01/01/2018   Obesity (BMI 30-39.9) 01/01/2018   SK (seborrheic keratosis) 04/18/2017   Pain management contract agreement 11/17/2016   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   Allergic rhinitis due to animal hair and dander 12/02/2015   Low back pain 03/03/2015   Incontinence 10/30/2013   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   Neutrophilic leukocytosis 08/12/2012   Thrombocytosis 08/12/2012   Osteoporosis, senile 11/15/2010   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   Hyperlipidemia 08/19/2010   Impaired fasting glucose 08/19/2010   Polyarthritis 09/28/2009   Degenerative disc disease 04/07/2009   Depression with anxiety 04/07/2009   Myopathy 04/07/2009   Insomnia 04/07/2009   Migraine 04/07/2009   Scoliosis 04/07/2009   Seasonal allergies 04/07/2009   Vitamin B12 deficiency anemia 04/07/2009    PCP: No PCP  REFERRING PROVIDER: Zada Palin, NP  REFERRING DIAG: M45 Ankylosing Spondylitis of cervical region M50.11 Cervical radiculopathy M79.7 Fibromyalgia  THERAPY DIAG:  Cervicalgia  Muscle weakness (generalized)  Rationale for Evaluation and Treatment: Rehabilitation  ONSET DATE: November 10   SUBJECTIVE:  SUBJECTIVE STATEMENT: Pt states she feels she had some knots in the Lt side of her neck from Sunday into Monday. Symptoms improved with muscle relaxants and heat. The knots are feeling better  and normal since Tuesday and Wednesday. Today she feels more knots on the Rt than Lt. Patient reports that she has had episodes of dizziness most recently last night when getting up from recliner. Dizziness and some nausea lasted for some time - dizziness lasted ~ 2 hours and resolved.   Hand dominance: Left  PERTINENT HISTORY:  See above  Spinal stimulator C1-C2 fusion   Pt reports she was supposed to have knee surgery in August, however the week before, she was diagnosed for having cellulitis in left leg, requiring hospitalization. Around the same time, pt also needed to have and was approved having C1-C2 fusion surgery on Nov 10, 25. She feels very tight on the back of the neck muscles, stating she cannot open her mouth to let food in all the way, and has difficulty chewing food. Reports staying 8 days in inpatient rehab after surgery. Staples removed 3 days ago. At this time her knee surgery is cancelled, and focus is on rehabilitating the neck.   PAIN:  Are you having pain? Yes: NPRS scale: 5-6/10 Pain location: back of neck , bil upper traps, upper anterior thoracic  Pain description: constant, throbbing, sharp, tight, electric  Aggravating factors: everything hurts Relieving factors: muscle relxer , pain meds   PRECAUTIONS: Other: Spinal stimulator    WEIGHT BEARING RESTRICTIONS: No  FALLS:  Has patient fallen in last 6 months? No  LIVING ENVIRONMENT: Lives with: lives alone Lives in: House/apartment Stairs: Yes: External: 4 steps; can reach both Has following equipment at home: None  OCCUPATION: Retired    PLOF: Independent  PATIENT GOALS: To move my neck more, less tightness, get the rocks out of my neck and head  NEXT MD VISIT: mid Jan 2026  OBJECTIVE:  Note: Objective measures were completed at Evaluation unless otherwise noted.  DIAGNOSTIC FINDINGS:  None on file  PATIENT SURVEYS:  NDI: 62% (31/50)  COGNITION: Overall cognitive status: Within functional limits for tasks assessed  SENSATION: Not tested  POSTURE: rounded shoulders and forward head  PALPATION: Tender to palpate at bilateral upper traps Lt>Rt Tender:  -Bil lev scap Lt>Rt -suboccipitals - Bil Scalenes -Bil upper anterior thoracic region   CERVICAL ROM:   Active ROM A/PROM (deg) eval  Flexion 20 pain  Extension 15 pain  Right lateral flexion 10 pain  Left lateral flexion 30 pain  Right rotation 15 pain  Left rotation 11 pain   (Blank rows = not tested)  UPPER EXTREMITY ROM:  Active ROM Right eval Left eval  Shoulder flexion 130 145  Shoulder extension [facility]  Shoulder abduction [facility]  Shoulder adduction    Shoulder extension    Shoulder internal rotation [facility]  Shoulder external rotation [facility] WFL  Elbow flexion    Elbow extension    Wrist flexion    Wrist extension    Wrist ulnar deviation    Wrist radial deviation    Wrist pronation    Wrist supination     (Blank rows = not tested)  UPPER EXTREMITY MMT:  MMT Right eval Left eval  Shoulder flexion 4- 4  Shoulder extension    Shoulder abduction 4+ 4+  Shoulder adduction    Shoulder extension 4 4  Shoulder internal rotation 4+ 4+  Shoulder external rotation 4+ 4+  Middle  trapezius    Lower trapezius    Elbow flexion 4 4  Elbow extension 4 4  Wrist flexion 4+ 4+  Wrist extension 4 4  Wrist ulnar deviation    Wrist radial deviation    Wrist pronation    Wrist supination    Grip strength     (Blank rows = not tested)   FUNCTIONAL TESTS:  5 times sit to stand: 19.32 sec   TREATMENT DATE:  [facility] Adult PT  Treatment:                                                DATE: 11/13/24  Manual Therapy: STM cervical paraspinals, bilat UT, suboccipitals STM/TPR through pecs; upper traps Desentization for area of surgery; scalp; scar Gentle ROM c spine to tolerance Accupressure headache points  Neuromuscular re-ed: Supine and sitting cervical rotation, cervical nods to patient tolerance Therapeutic Activity: Chin tuck in supine gentle 5 sec x 10  Posterior shoulder rolls sitting x 10 Scap squeeze sitting x 10   [facility] Adult PT Treatment:                                                DATE: 11/11/24  Manual Therapy: STM, TPR cervical paraspinals, bilat UT, suboccipitals Gentle ROM c spine as tolerated Neuromuscular re-ed: Head on coregeous ball: cervical rotation, cervical nods Cervical retraction as tolerated Therapeutic Activity: Posterior shoulder rolls sitting x 10 Scap squeeze sitting x 10 Discussed continued POC and recommendations for activity modifications during heat pack  Modalities: Heat pack cervical x 10 minutes end of session Self Care: Incision with no redness or drainage. Appears closed at this time  [facility] Adult PT Treatment:                                                DATE: 11/06/24  Manual Therapy: STM cervical paraspinals, bilat UT, suboccipitals STM/TPR through pecs; upper traps Desentization for area of surgery; scalp; scar Gentle ROM c spine to tolerance Accupressure headache points  Neuromuscular re-ed: Supine and sitting cervical rotation, cervical nods to patient tolerance Therapeutic Activity: Chin tuck in supine gentle 5 sec x 10  Posterior shoulder rolls sitting x 10 Scap squeeze sitting x 10 Self Care: Incision with no redness or drainage. Appears closed at this time  [facility] Adult PT Treatment:                                                DATE: 11/04/24  Manual Therapy: STM cervical paraspinals, bilat UT, suboccipitals Gentle ROM c spine to  tolerance Neuromuscular re-ed: Head on coregeous ball: cervical rotation, cervical nods Therapeutic Activity: Posterior shoulder rolls x 10 Scap squeeze x 10 Shoulder elevation/depression x 10 Hug a tree 2 x 8  Modalities: Moist heat cervical x 10 min Self Care: Incision with no redness or drainage. Appears closed at this time  PATIENT EDUCATION:  Education details: HEP and POC Person educated: Patient Education method: Explanation, Demonstration, Tactile cues, Verbal cues, and Handouts Education comprehension: verbalized understanding, returned demonstration, verbal cues required, tactile cues required, and needs further education  HOME EXERCISE PROGRAM: Access Code: [facility] URL: [email or web address] Date: 10/29/2024 Prepared by: Lavanda Cleverly  Exercises - Seated Shoulder Shrugs  - 1 x daily - 7 x weekly - 3 sets - 10 reps - Beginner Hug a Tree  - 1 x daily - 7 x weekly - 3 sets - 10 reps - Seated Cervical Sidebending AROM  - 1 x daily - 7 x weekly - 3 sets - 10 reps  ASSESSMENT:  CLINICAL IMPRESSION: Pt continues to respond well to manual work and gentle ROM. She has had episodes of minor dizziness following manual work the last three sessions which resolves within a few minutes. Episode of dizziness last night may be related to position in recliner. Continued to educate pt on modifying sitting in recliner to sit more upright. She will continue with gentle ROM as tolerated throughout the day  OBJECTIVE IMPAIRMENTS: Abnormal gait, decreased activity tolerance, decreased balance, decreased coordination, decreased endurance, decreased mobility, decreased ROM, decreased strength, hypomobility, increased fascial restrictions, increased muscle spasms, impaired flexibility, impaired UE functional use, improper body mechanics, postural  dysfunction, and pain.    GOALS: Goals reviewed with patient? Yes  SHORT TERM GOALS: Target date: 11/29/24  Pt will be efficient with initial HEP so she has increased independence for everyday activities.  Baseline:  Goal status: INITIAL  2.  Pt will report no more than 6/10 neck pain so she has increased functional capacity for driving safely Baseline: 1/89 Goal status: INITIAL  3.  Pt will improve 5 sit to stands test by completing in 14 sec or less so she has increased strength and mobility for community services Baseline:  Goal status: INITIAL    LONG TERM GOALS: Target date: 12/24/24  Pt will be efficient with advanced HEP so she has increased independence for everyday activities.  Baseline:  Goal status: INITIAL  2.  Pt will be able to increase cervical flexion and extension ROM at least 10 deg or more so she has increased functional capacity for everyday ADL's (ex: reading,etc) Baseline: flexion 20 deg, ext 15 deg  Goal status: INITIAL  3.  Pt will improve NDI survey by scoring at least 10 % improvement so she can lift objects for ADL's safely Baseline: 62% (31/50) Goal status: INITIAL     PLAN:  PT FREQUENCY: 2x/week  PT DURATION: 8 weeks  PLANNED INTERVENTIONS: 97164- PT Re-evaluation, 97750- Physical Performance Testing, 97110-Therapeutic exercises, 97530- Therapeutic activity, 97112- Neuromuscular re-education, 97535- Self Care, 02859- Manual therapy, [phone]- Gait training, [phone]- Aquatic Therapy, [phone] (1-2 muscles), 20561 (3+ muscles)- Dry Needling, Patient/Family education, Balance training, Stair training, Joint mobilization, Spinal mobilization, Cryotherapy, and Moist heat  PLAN FOR NEXT SESSION: neck stretches as tolerated, review HEP, deep neck flexor strengthening, median, ulnar, radial nerve mobilization, cervical flexion AROM   Nanami Whitelaw P Aritzel Krusemark, PT 11/13/2024, 11:06 AM

## 2024-11-14 ENCOUNTER — Other Ambulatory Visit

## 2024-11-14 ENCOUNTER — Other Ambulatory Visit: Payer: Self-pay | Admitting: Medical-Surgical

## 2024-11-14 DIAGNOSIS — N3941 Urge incontinence: Secondary | ICD-10-CM

## 2024-11-14 LAB — URINALYSIS, ROUTINE W REFLEX MICROSCOPIC
Bilirubin, UA: NEGATIVE
Glucose, UA: NEGATIVE
Ketones, UA: NEGATIVE
Nitrite, UA: NEGATIVE
Protein,UA: NEGATIVE
Specific Gravity, UA: 1.015 (ref 1.005–1.030)
Urobilinogen, Ur: 0.2 mg/dL (ref 0.2–1.0)
pH, UA: 6.5 (ref 5.0–7.5)

## 2024-11-14 LAB — MICROSCOPIC EXAMINATION: Bacteria, UA: NONE SEEN

## 2024-11-14 NOTE — Telephone Encounter (Signed)
 Copied from CRM #[id number]. Topic: Clinical - Medication Refill >> Nov 14, 2024 11:43 AM Fonda T wrote: Medication: propranolol  ER (INDERAL  LA) 60 MG 24 hr capsule  Pt leaving to go out of town on Monday 12/22, and will not be returning until 11/29/2024, will run out next week, requesting med refill prior to leaving to go out of town, on Monday morning   Has the patient contacted their pharmacy? Yes, advised pt to contact office for refill request, as per pharmacy has not received a response after multiple attempts of reaching out to office.    This is the patient's preferred pharmacy:  [facility] [place], [place] - [address] [place] [address] [place] [address] Phone: [phone] Fax: [phone]   Is this the correct pharmacy for this prescription? Yes If no, delete pharmacy and type the correct one.   Has the prescription been filled recently? Yes  Is the patient out of the medication? No, has a few left  Has the patient been seen for an appointment in the last year OR does the patient have an upcoming appointment? Yes  Can we respond through MyChart? No, prefers phone call at [phone]  Agent: Please be advised that Rx refills may take up to 3 business days. We ask that you follow-up with your pharmacy.

## 2024-11-17 ENCOUNTER — Ambulatory Visit: Payer: Self-pay | Admitting: Urology

## 2024-11-18 ENCOUNTER — Other Ambulatory Visit: Payer: Self-pay

## 2024-11-18 MED ORDER — PROPRANOLOL HCL ER 60 MG PO CP24: 60.0000 mg | ORAL_CAPSULE | Freq: Every day | ORAL | 0 refills | Status: AC

## 2024-11-18 NOTE — Telephone Encounter (Signed)
 Requesting rx rf of propanolol60mg  Last written 07/29/2024 Last OV 10/28/2024 Upcoming appt 12/02/2024

## 2024-11-28 ENCOUNTER — Ambulatory Visit: Admitting: Physical Therapy

## 2024-11-29 ENCOUNTER — Ambulatory Visit
Admission: EM | Admit: 2024-11-29 | Discharge: 2024-11-29 | Disposition: A | Discharge status: Home / Self Care | Attending: Family Medicine | Admitting: Family Medicine

## 2024-11-29 ENCOUNTER — Other Ambulatory Visit: Payer: Self-pay

## 2024-11-29 DIAGNOSIS — J111 Influenza due to unidentified influenza virus with other respiratory manifestations: Secondary | ICD-10-CM

## 2024-11-29 DIAGNOSIS — R110 Nausea: Secondary | ICD-10-CM | POA: Diagnosis not present

## 2024-11-29 LAB — POC COVID19/FLU A&B COMBO
Covid Antigen, POC: NEGATIVE
Influenza A Antigen, POC: NEGATIVE
Influenza B Antigen, POC: NEGATIVE

## 2024-11-29 MED ORDER — ONDANSETRON 8 MG PO TBDP: 8.0000 mg | ORAL_TABLET | Freq: Three times a day (TID) | ORAL | 0 refills | Status: DC | PRN

## 2024-11-29 NOTE — Discharge Instructions (Addendum)
 Advised patient may take Zofran daily or as needed for nausea.  Encouraged to increase daily water intake to 64 ounces per day while taking this medication.  Advised if symptoms worsen and/or unresolved please follow-up with your PCP or here for further evaluation.

## 2024-11-29 NOTE — ED Provider Notes (Signed)
 " Heather Thompson    CSN: [id number] Arrival date & time: 11/29/24  1206      History   Chief Complaint Chief Complaint  Patient presents with   Cough    HPI Heather Thompson is a 74 y.o. female.   HPI 74 year old female presents with cough-productive with yellow sputum, body ache, dizziness and loss of appetite dry mouth for 3 days.  PMH significant for acquired hypothyroidism, clotting disorder, and fibromyalgia.  Past Medical History:  Diagnosis Date   Abnormality of gait 04/23/2019   Acquired hypothyroidism 07/29/2021   Allergic rhinitis due to animal hair and dander 12/02/2015   Formatting of this note might be different from the original.  [facility]  Seen by Dr. Leita 11/11/15     Plan- Received allergy shots today. Follow up 1 year.     Allergy    Anemia    Anemia    Anxiety    Arthritis of scaphoid-trapezium-trapezoid joint of right hand 09/10/2023   B12 deficiency    Back pain    Chronic fatigue 07/29/2021   Chronic fatigue syndrome    Chronic throat clearing 03/14/2023   Clotting disorder    Constipation    Depression with anxiety 04/07/2009   Formatting of this note might be different from the original.  well controlled on current meds, follows with psychiatrist in [place]. Had a   h/o severe depression 5 years ago, and had ECT.     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  B12 deficiency   Iron deficiency     Herniated lumbar intervertebral disc 11/12/2020   High cholesterol    History of blood clots    History of cervical spinal surgery 01/01/2018   Formatting of this note might be different from the original.  Fusion C6  and C7 per 2018 MRI report.     History of left hip replacement 01/14/2018   History of Roux-en-Y gastric bypass    Hoarseness 03/14/2023   Hyperlipidemia 08/19/2010   Hypertension    Hyponatremia 01/17/2018   Hypothyroidism    Hypothyroidism    IBS (irritable bowel syndrome) 04/06/2021   Impaired fasting glucose 08/19/2010   Incontinence 10/30/2013   Formatting of this note might be different from the original. Formatting of this note might be different from the original. Urinary Incontinence     Infertility, female    Insomnia 04/07/2009   Formatting of this note might be different from the original.  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  Followed by [facility]  Seen by Dr. Linnette 03/01/15     S/p changing thoracic spinal cord stimulator generator 08/18/14.     Lumbar spine films show spinal cord generator in left flank area. Lumbar spine has multilevel degenerative spondylosis with disc space narrowing and some asymmetric disc where leading to slight thi   Low serum iron 04/23/2019   Lumbar  post-laminectomy syndrome 07/06/2023   Migraine 04/07/2009   Muscle disorder    Muscle disorder    Muscle tension dysphonia 03/12/2023   Myoadenylate deaminase deficiency    Myopathy 04/07/2009   Formatting of this note might be different from the original.  Follows with Rheumatology in [facility] of this note might be different from the original.  Myoadenylate deaminase deficiency, diagnosed back in 1984, after muscle   biopsies     Neutrophilic leukocytosis 08/12/2012   OAB (overactive bladder) 08/28/2023   Obesity (BMI 30-39.9) 01/01/2018   Osteoarthritis    Osteomalacia 04/23/2019   Osteoporosis, senile 11/15/2010   Other myositis, left thigh 04/24/2023   Pain management  contract agreement 11/17/2016   Formatting of this note might be different from the original.  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  Getting iron infusion via port at hematalogy/oncology center at [facility].   Was recently hospitalized in [facility] for severe anemia     Formatting of this note might be different from the original.  on OTC b12 currently     Vitamin D  deficiency     Patient Active Problem List   Diagnosis Date Noted   Onychodystrophy 08/14/2024   Oral candidiasis 07/08/2024   Cellulitis of left foot 06/30/2024   Chronic bilateral hip pain after total replacement of both hip joints 06/18/2024   Left leg pain 06/18/2024   Mononeuropathy 12/14/2023   Palpitations 10/04/2023   Cardiac murmur 10/04/2023   Allergy    Anemia    Anxiety    B12  deficiency    Back pain    Chronic fatigue syndrome    Clotting disorder    Constipation    Edema of both lower extremities    Fatty liver    Fibromyalgia    Gallbladder problem    High cholesterol    History of blood clots    History of Roux-en-Y gastric bypass    Hypothyroidism    Infertility, female    Joint pain    Kidney stones    Muscle disorder    Osteoarthritis    Prediabetes    Salzmann nodular degeneration    SOBOE (shortness of breath on exertion)    Swallowing difficulty    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   Arthritis of scaphoid-trapezium-trapezoid joint of right hand 09/10/2023   Urge incontinence 08/28/2023  OAB (overactive bladder) 08/28/2023   Lumbar post-laminectomy syndrome 07/06/2023   Snapping hip syndrome, 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   Other myositis, left thigh 04/24/2023   Sacroiliitis, not elsewhere classified 04/24/2023   Dysphagia 04/17/2023   Elevated liver enzymes 04/02/2023   Elevated TSH 04/02/2023   Chronic throat clearing 03/14/2023   Globus sensation 03/14/2023   Hoarseness 03/14/2023   Muscle tension dysphonia 03/12/2023   Unspecified inflammatory spondylopathy, 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   Primary osteoarthritis of both knees 12/21/2021   Port-A-Cath in place 07/29/2021   Acquired hypothyroidism 07/29/2021   Chronic pain syndrome 07/29/2021   Vitamin B12 deficiency 07/29/2021   Vitamin D  deficiency 07/29/2021   Postmenopausal estrogen deficiency 07/29/2021   IBS (irritable bowel syndrome) 04/06/2021   Retention of urine 04/06/2021   Herniated lumbar intervertebral disc 11/12/2020   Skin sensation disturbance 11/12/2020   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   Spondylopathy, unspecified 11/12/2020   Trochanteric bursitis of both hips 11/19/2019   Low serum iron 04/23/2019   Osteomalacia 04/23/2019   Abnormality of gait 04/23/2019   History of bilateral hip arthroplasty 06/26/2018   Gastroesophageal reflux disease  06/18/2018   Hypertension 06/18/2018   Intertrigo 04/30/2018   Lentigines 04/30/2018   Hyponatremia 01/17/2018   History of cervical spinal surgery 01/01/2018   Obesity (BMI 30-39.9) 01/01/2018   SK (seborrheic keratosis) 04/18/2017   Pain management contract agreement 11/17/2016   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   Allergic rhinitis due to animal hair and dander 12/02/2015   Low back pain 03/03/2015   Incontinence 10/30/2013   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   Neutrophilic leukocytosis 08/12/2012   Thrombocytosis 08/12/2012   Osteoporosis, senile 11/15/2010   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   Hyperlipidemia 08/19/2010   Impaired fasting glucose 08/19/2010   Polyarthritis 09/28/2009   Degenerative disc disease 04/07/2009   Depression with anxiety 04/07/2009   Myopathy 04/07/2009   Insomnia 04/07/2009   Migraine 04/07/2009   Scoliosis 04/07/2009   Seasonal allergies 04/07/2009   Vitamin B12 deficiency anemia 04/07/2009    Past Surgical History:  Procedure Laterality Date   ABDOMINAL HYSTERECTOMY     APPENDECTOMY     CARPAL TUNNEL RELEASE Bilateral 2023   with thumb joint replacement   CERVICAL FUSION     CHOLECYSTECTOMY     FOOT SURGERY Bilateral    fusion   GASTRIC BYPASS     IR EMBO ARTERIAL NOT HEMORR HEMANG INC GUIDE ROADMAPPING  04/01/2024   IR RADIOLOGIST EVAL & MGMT  03/14/2024   IR RADIOLOGIST EVAL & MGMT  04/24/2024   SPINAL CORD STIMULATOR INSERTION  07/2023   TOTAL HIP ARTHROPLASTY Bilateral    11/2016 & 05/2017    OB History     Gravida  2   Para  2   Term      Preterm      AB      Living         SAB      IAB      Ectopic      Multiple      Live Births               Home Medications    Prior to Admission medications  Medication Sig Start Date End Date Taking? Authorizing Provider  ondansetron  (ZOFRAN -ODT) 8 MG disintegrating tablet Take 1 tablet (8 mg total) by mouth every 8 (eight) hours as needed for nausea or vomiting. 11/29/24  Yes  Teddy Sharper, FNP  buPROPion  (WELLBUTRIN  XL) 300 MG 24 hr tablet  Take 1 tablet (300 mg total) by mouth daily. Take in the evening 10/28/24   Jessup, Joy, NP  cyclobenzaprine (FLEXERIL) 10 MG tablet Take 10 mg by mouth every 8 (eight) hours as needed for muscle spasms. 09/19/24   [provider]  desvenlafaxine  (PRISTIQ ) 100 MG 24 hr tablet Take 1 tablet (100 mg total) by mouth daily. 07/21/24   Mozingo, Regina Nattalie, NP  FLUoxetine  (PROZAC ) 10 MG capsule Take 1 capsule (10 mg total) by mouth daily. 09/19/24   Willo Mini, NP  furosemide  (LASIX ) 20 MG tablet Take 1 tablet (20 mg total) by mouth daily. 11/07/24   Willo Mini, NP  gabapentin  (NEURONTIN ) 300 MG capsule Take 1 capsule (300 mg total) by mouth 3 (three) times daily. 09/19/24   Willo Mini, NP  HYDROcodone-acetaminophen  (NORCO/VICODIN) 5-325 MG tablet Take 1 tablet by mouth every 4 (four) hours as needed. 09/19/24   [provider]  HYDROmorphone (DILAUDID) 2 MG tablet Take 2 mg by mouth 2 (two) times daily as needed for severe pain (pain score 7-10).    [provider]  lisinopril  (ZESTRIL ) 20 MG tablet Take 1 tablet (20 mg total) by mouth daily. 07/08/24 10/28/24  Willo Mini, NP  Magnesium 250 MG TABS Take 250 mg by mouth at bedtime.    [provider]  nystatin  (MYCOSTATIN /NYSTOP ) powder APPLY TO THE AFFECTED AREA(S) EXTERNALLY TWICE DAILY 10/28/24   Willo Mini, NP  propranolol  ER (INDERAL  LA) 60 MG 24 hr capsule Take 1 capsule (60 mg total) by mouth daily. 11/18/24   Willo Mini, NP  solifenacin  (VESICARE ) 10 MG tablet Take 1 tablet (10 mg total) by mouth daily. 10/07/24   Stoneking, Adine PARAS., MD  tiZANidine  (ZANAFLEX ) 2 MG tablet TAKE 1 TABLET (2 MG TOTAL) BY MOUTH IN THE MORNING AND AT BEDTIME. 06/25/23   Antoniette Vermell CROME, PA-C    Family History Family History  Problem Relation Age of Onset   Hyperlipidemia Mother    Hypertension Mother    Heart attack Mother    Heart disease Mother     Sudden death Mother    Depression Mother    Anxiety disorder Mother    Obesity Mother    Anxiety disorder Father    Depression Father    Cancer Father    Hyperlipidemia Father    Hypertension Father    Lung cancer Father    Prostate cancer Father    Kidney cancer Brother    Healthy Daughter    Healthy Son     Social History Social History[1]   Allergies   Compazine [prochlorperazine], Phenothiazines, Prednisone, Pregabalin, and Cefadroxil   Review of Systems Review of Systems   Physical Exam Triage Vital Signs ED Triage Vitals  Encounter Vitals Group     BP 11/29/24 1243 (!) 145/90     Girls Systolic BP Percentile --      Girls Diastolic BP Percentile --      Boys Systolic BP Percentile --      Boys Diastolic BP Percentile --      Pulse Rate 11/29/24 1243 (!) 107     Resp 11/29/24 1243 18     Temp 11/29/24 1243 99 F (37.2 C)     Temp Source 11/29/24 1243 Oral     SpO2 11/29/24 1243 96 %     Weight 11/29/24 1246 189 lb (85.7 kg)     Height 11/29/24 1246 5' 4 (1.626 m)     Head Circumference --  Peak Flow --      Pain Score 11/29/24 1246 4     Pain Loc --      Pain Education --      Exclude from Growth Chart --    No data found.  Updated Vital Signs BP (!) 145/90 (BP Location: Right Arm)   Pulse (!) 107   Temp 99 F (37.2 C) (Oral)   Resp 18   Ht 5' 4 (1.626 m)   Wt 189 lb (85.7 kg)   SpO2 96%   BMI 32.44 kg/m   Visual Acuity Right Eye Distance:   Left Eye Distance:   Bilateral Distance:    Right Eye Near:   Left Eye Near:    Bilateral Near:     Physical Exam Vitals and nursing note reviewed.  Constitutional:      Appearance: Normal appearance. She is normal weight.  HENT:     Head: Normocephalic and atraumatic.     Right Ear: Tympanic membrane, ear canal and external ear normal.     Left Ear: Tympanic membrane, ear canal and external ear normal.     Mouth/Throat:     Mouth: Mucous membranes are moist.     Pharynx: Oropharynx  is clear.  Eyes:     Extraocular Movements: Extraocular movements intact.     Conjunctiva/sclera: Conjunctivae normal.     Pupils: Pupils are equal, round, and reactive to light.  Cardiovascular:     Rate and Rhythm: Normal rate and regular rhythm.     Heart sounds: Normal heart sounds.  Pulmonary:     Effort: Pulmonary effort is normal.     Breath sounds: Normal breath sounds. No wheezing, rhonchi or rales.  Musculoskeletal:        General: Normal range of motion.  Skin:    General: Skin is warm and dry.  Neurological:     General: No focal deficit present.     Mental Status: She is alert and oriented to person, place, and time. Mental status is at baseline.  Psychiatric:        Mood and Affect: Mood normal.        Behavior: Behavior normal.      UC Treatments / Results  Labs (all labs ordered are listed, but only abnormal results are displayed) Labs Reviewed  POC COVID19/FLU A&B COMBO - Normal    EKG   Radiology No results found.  Procedures Procedures (including critical Thompson time)  Medications Ordered in UC Medications - No data to display  Initial Impression / Assessment and Plan / UC Course  I have reviewed the triage vital signs and the nursing notes.  Pertinent labs & imaging results that were available during my Thompson of the patient were reviewed by me and considered in my medical decision making (see chart for details).     MDM: 1.  Influenza-like illness-COVID-19 and influenza A/B both negative this afternoon; 2.  Nausea-Rx'd Zofran  8 mg disintegrating tablet: Take 1 tablet every 8 hours, as needed for nausea. Advised patient may take Zofran  daily or as needed for nausea.  Encouraged to increase daily water intake to 64 ounces per day while taking this medication.  Advised if symptoms worsen and/or unresolved please follow-up with your PCP or here for further evaluation.  Patient discharged home, hemodynamically stable. Final Clinical Impressions(s) / UC  Diagnoses   Final diagnoses:  Influenza-like illness  Nausea     Discharge Instructions      Advised patient may take Zofran   daily or as needed for nausea.  Encouraged to increase daily water intake to 64 ounces per day while taking this medication.  Advised if symptoms worsen and/or unresolved please follow-up with your PCP or here for further evaluation.     ED Prescriptions     Medication Sig Dispense Auth. Provider   ondansetron  (ZOFRAN -ODT) 8 MG disintegrating tablet Take 1 tablet (8 mg total) by mouth every 8 (eight) hours as needed for nausea or vomiting. 24 tablet Veena Sturgess, FNP      PDMP not reviewed this encounter.    [1]  Social History Tobacco Use   Smoking status: Never    Passive exposure: Past   Smokeless tobacco: Never  Vaping Use   Vaping status: Never Used  Substance Use Topics   Alcohol use: Not Currently    Comment: rarely   Drug use: Never     Teddy Sharper, FNP 11/29/24 1336  "

## 2024-11-29 NOTE — ED Triage Notes (Signed)
 Pt presenting with c/o productive cough with yellow sputum, fatigue, body ache, dizziness, loss of appetite, dry mouth x 3 days. Pt stated she has been taking OTC cough medication and Tylenol  which were ineffective.

## 2024-12-01 ENCOUNTER — Inpatient Hospital Stay: Attending: Hematology & Oncology

## 2024-12-02 ENCOUNTER — Encounter: Payer: Self-pay | Admitting: Medical-Surgical

## 2024-12-02 ENCOUNTER — Ambulatory Visit: Admitting: Medical-Surgical

## 2024-12-02 ENCOUNTER — Encounter: Payer: Self-pay | Admitting: Physical Therapy

## 2024-12-02 ENCOUNTER — Ambulatory Visit: Attending: Sports Medicine | Admitting: Physical Therapy

## 2024-12-02 VITALS — BP 129/76 | HR 63 | Temp 99.3°F | Resp 16 | Ht 64.0 in | Wt 188.0 lb

## 2024-12-02 DIAGNOSIS — J069 Acute upper respiratory infection, unspecified: Secondary | ICD-10-CM | POA: Diagnosis not present

## 2024-12-02 DIAGNOSIS — Z981 Arthrodesis status: Secondary | ICD-10-CM | POA: Insufficient documentation

## 2024-12-02 DIAGNOSIS — I10 Essential (primary) hypertension: Secondary | ICD-10-CM

## 2024-12-02 DIAGNOSIS — M6281 Muscle weakness (generalized): Secondary | ICD-10-CM | POA: Diagnosis present

## 2024-12-02 DIAGNOSIS — M542 Cervicalgia: Secondary | ICD-10-CM | POA: Insufficient documentation

## 2024-12-02 NOTE — Progress Notes (Signed)
 "       Established patient visit   History of Present Illness   Discussed the use of AI scribe software for clinical note transcription with the patient, who gave verbal consent to proceed.  History of Present Illness   Heather Thompson is a 74 year old female who presents with persistent exhaustion and altered taste following a viral illness.  Post-viral fatigue and malaise - Onset of symptoms on New Year's Eve after travel from [place]  - Initial symptoms included body aches, low-grade fever to 100.91F, cough, and marked exhaustion - COVID-19 and influenza tests negative at urgent care; diagnosed with viral illness - Persistent exhaustion with minimal energy for basic tasks - Body aches have improved somewhat but fatigue remains  Altered gustatory sensation and oral symptoms - Persistent metallic, burning taste in mouth - Constant dry mouth - No food or fluids taste normal, including water - Cold foods better tolerated  Decreased oral intake and weight loss - Marked loss of appetite since illness onset - 7 lb weight loss - Eating very small amounts, mainly half a can of soup and some canned fruit - Drinking less than one bottle of fluid daily compared to usual five to six bottles  Nausea and vomiting - One episode of vomiting a few days ago triggered by smell of soup - Persistent slight nausea - Did not obtain prescribed Zofran  due to pharmacy closure and cost  Lymphadenopathy - Lumps present in the back of neck  Chronic pain management - Taking lisinopril  from a one-year refill obtained in August - Decreased Dilaudid use as neck pain has improved       Physical Exam   Physical Exam Vitals reviewed.  Constitutional:      General: She is not in acute distress.    Appearance: Normal appearance. She is not ill-appearing.  HENT:     Head: Normocephalic and atraumatic.  Cardiovascular:     Rate and Rhythm: Normal rate and regular rhythm.     Pulses: Normal pulses.      Heart sounds: Normal heart sounds. No murmur heard.    No friction rub. No gallop.  Pulmonary:     Effort: Pulmonary effort is normal. No respiratory distress.     Breath sounds: Normal breath sounds. No wheezing.  Skin:    General: Skin is warm and dry.  Neurological:     Mental Status: She is alert and oriented to person, place, and time.  Psychiatric:        Mood and Affect: Mood normal.        Behavior: Behavior normal.        Thought Content: Thought content normal.        Judgment: Judgment normal.    Assessment & Plan    Assessment and Plan    Acute viral upper respiratory infection Likely viral etiology, possibly metapneumovirus. Symptoms consistent with viral infection, no indication for antibiotics. Discussed dehydration risk and importance of fluid intake. Explained viral infections can alter taste and smell due to inflammation and congestion. - Increase fluid intake with options like Gatorade or juice to prevent dehydration. - Monitor symptoms; consider antibiotics if symptoms worsen or do not improve.  Insomnia Previous trial of Ambien, Sonata, Lunesta, Trazodone , Hydroxyzine , and several other agents over the past 30 years. None effective. Temazepam  was effective but was discontinued due to treatment with Dilaudid for chronic pain. Doxepin contraindicated due to current treatment with 2 other serotonergic agents and the risk for Serotonin Syndrome.  Dayvigo  caused severe headache. Bellsomra coverage denied. - Patient meets criteria for PA for Bellsomra with her history of multiple medications trialed and failed - Resubmitting Bellsomra 20mg  daily for PA.  - Check with pharmacist for Rejuvia sleep spray interactions.  Primary hypertension BP stable and at goal with Lisinopril . Compliant with dosing. Denies concerns.  - Continue lisinopril , refills on file.   Status post cervical spinal fusion Improvement in neck symptoms, lumps likely due to muscle healing.  Physical therapy sessions canceled due to illness. - Continue to monitor neck symptoms and muscle healing.        Follow up   Return in about 6 weeks (around 01/13/2025) for chronic disease follow up. __________________________________ Zada FREDRIK Palin, DNP, APRN, FNP-BC Primary Care and Sports Medicine [facility] [place] "

## 2024-12-02 NOTE — Therapy (Signed)
 " OUTPATIENT PHYSICAL THERAPY CERVICAL TREATMENT   Patient Name: Heather Thompson MRN: [id number] DOB:[birth year], 74 y.o., female Today's Date: 12/02/2024  END OF SESSION:  PT End of Session - 12/02/24 1612     Visit Number 6    Number of Visits 17    Date for Recertification  12/24/24    Authorization Type Medicare Part A and B    Authorization - Visit Number 6    Progress Note Due on Visit 10    PT Start Time 1445    PT Stop Time 1530    PT Time Calculation (min) 45 min    Activity Tolerance Patient tolerated treatment well    Behavior During Therapy [facility] for tasks assessed/performed             Past Medical History:  Diagnosis Date   Abnormality of gait 04/23/2019   Acquired hypothyroidism 07/29/2021   Allergic rhinitis due to animal hair and dander 12/02/2015   Formatting of this note might be different from the original.  [facility]  Seen by Dr. Leita 11/11/15     Plan- Received allergy shots today. Follow up 1 year.     Allergy    Anemia    Anemia    Anxiety    Arthritis of scaphoid-trapezium-trapezoid joint of right hand 09/10/2023   B12 deficiency    Back pain    Chronic fatigue 07/29/2021   Chronic fatigue syndrome    Chronic throat clearing 03/14/2023   Clotting disorder    Constipation    Depression with anxiety 04/07/2009   Formatting of this note might be different from the original.  well controlled on current meds, follows with psychiatrist in [place]. Had a   h/o severe depression 5 years ago, and had ECT.     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  B12 deficiency   Iron deficiency     Herniated lumbar intervertebral disc  11/12/2020   High cholesterol    History of blood clots    History of cervical spinal surgery 01/01/2018   Formatting of this note might be different from the original.  Fusion C6 and C7 per 2018 MRI report.     History of left hip replacement 01/14/2018   History of Roux-en-Y gastric bypass    Hoarseness 03/14/2023   Hyperlipidemia 08/19/2010   Hypertension    Hyponatremia 01/17/2018   Hypothyroidism    Hypothyroidism    IBS (irritable bowel syndrome) 04/06/2021   Impaired fasting glucose 08/19/2010   Incontinence 10/30/2013   Formatting of this note might be different from the original. Formatting of this note might be different from the original. Urinary Incontinence     Infertility, female    Insomnia 04/07/2009   Formatting of this note might be different from the original.  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  Followed by [facility]  Seen by Dr. Linnette  03/01/15     S/p changing thoracic spinal cord stimulator generator 08/18/14.     Lumbar spine films show spinal cord generator in left flank area. 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  Follows with Rheumatology in [facility] of this note might be different from the original.  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  Getting iron infusion via port at hematalogy/oncology center at [facility].   Was recently hospitalized in [facility] for severe anemia     Formatting of this note might be different from the original.  on OTC b12 currently     Vitamin D  deficiency    Past Surgical History:  Procedure Laterality Date   ABDOMINAL HYSTERECTOMY     APPENDECTOMY     CARPAL TUNNEL RELEASE Bilateral 2023   with thumb joint replacement   CERVICAL FUSION      CHOLECYSTECTOMY     FOOT SURGERY Bilateral    fusion   GASTRIC BYPASS     IR EMBO ARTERIAL NOT HEMORR HEMANG INC GUIDE ROADMAPPING  04/01/2024   IR RADIOLOGIST EVAL & MGMT  03/14/2024   IR RADIOLOGIST EVAL & MGMT  04/24/2024   SPINAL CORD STIMULATOR INSERTION  07/2023   TOTAL HIP ARTHROPLASTY Bilateral    11/2016 & 05/2017   Patient Active Problem List   Diagnosis Date Noted   Status post cervical spinal fusion 12/02/2024   Onychodystrophy 08/14/2024   Oral candidiasis 07/08/2024   Chronic bilateral hip pain after total replacement of both hip joints 06/18/2024   Left leg pain 06/18/2024  Mononeuropathy 12/14/2023   Palpitations 10/04/2023   Cardiac murmur 10/04/2023   Allergy    Anemia    Anxiety    B12 deficiency    Back pain    Chronic fatigue syndrome    Clotting disorder    Constipation    Edema of both lower extremities    Fatty liver    Fibromyalgia    Gallbladder problem    High cholesterol    History of blood clots    History of Roux-en-Y gastric bypass    Hypothyroidism    Joint pain    Kidney stones    Muscle disorder    Osteoarthritis    Prediabetes    Salzmann nodular degeneration    SOBOE (shortness of breath on exertion)    Swallowing difficulty    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   Arthritis of scaphoid-trapezium-trapezoid joint of right hand 09/10/2023   Urge incontinence 08/28/2023   OAB (overactive bladder) 08/28/2023   Lumbar post-laminectomy syndrome 07/06/2023   Snapping hip syndrome, 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   Other myositis, left thigh 04/24/2023   Sacroiliitis, not elsewhere classified 04/24/2023   Dysphagia 04/17/2023   Elevated liver enzymes 04/02/2023   Elevated TSH 04/02/2023   Chronic throat clearing 03/14/2023   Globus sensation 03/14/2023   Hoarseness 03/14/2023   Muscle tension dysphonia 03/12/2023   Unspecified inflammatory spondylopathy, 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   Primary osteoarthritis of both knees 12/21/2021    Port-A-Cath in place 07/29/2021   Acquired hypothyroidism 07/29/2021   Chronic pain syndrome 07/29/2021   Vitamin B12 deficiency 07/29/2021   Vitamin D  deficiency 07/29/2021   Postmenopausal estrogen deficiency 07/29/2021   IBS (irritable bowel syndrome) 04/06/2021   Retention of urine 04/06/2021   Herniated lumbar intervertebral disc 11/12/2020   Skin sensation disturbance 11/12/2020   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   Spondylopathy, unspecified 11/12/2020   Trochanteric bursitis of both hips 11/19/2019   Low serum iron 04/23/2019   Osteomalacia 04/23/2019   Abnormality of gait 04/23/2019   History of bilateral hip arthroplasty 06/26/2018   Gastroesophageal reflux disease 06/18/2018   Hypertension 06/18/2018   Intertrigo 04/30/2018   Lentigines 04/30/2018   Hyponatremia 01/17/2018   History of cervical spinal surgery 01/01/2018   Obesity (BMI 30-39.9) 01/01/2018   SK (seborrheic keratosis) 04/18/2017   Pain management contract agreement 11/17/2016   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   Allergic rhinitis due to animal hair and dander 12/02/2015   Low back pain 03/03/2015   Incontinence 10/30/2013   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   Neutrophilic leukocytosis 08/12/2012   Thrombocytosis 08/12/2012   Osteoporosis, senile 11/15/2010   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   Hyperlipidemia 08/19/2010   Impaired fasting glucose 08/19/2010   Polyarthritis 09/28/2009   Degenerative disc disease 04/07/2009   Depression with anxiety 04/07/2009   Myopathy 04/07/2009   Insomnia 04/07/2009   Migraine 04/07/2009   Scoliosis 04/07/2009   Seasonal allergies 04/07/2009   Vitamin B12 deficiency anemia 04/07/2009    PCP: No PCP  REFERRING PROVIDER: Zada Palin, NP  REFERRING DIAG: M45 Ankylosing Spondylitis of cervical region M50.11 Cervical radiculopathy M79.7 Fibromyalgia  THERAPY DIAG:  Cervicalgia  Muscle weakness (generalized)  Rationale for Evaluation and Treatment:  Rehabilitation  ONSET DATE: November 10   SUBJECTIVE:  SUBJECTIVE STATEMENT: Pt returns after 2 weeks from vacation and having a virus. She states her neck is feeling ok, only 5/10 pain. She was more sore after not sleeping in her own bed while on vacation.  Hand dominance: Left  PERTINENT HISTORY:  See above  Spinal stimulator C1-C2 fusion   Pt reports she was supposed to have knee surgery in August, however the week before, she was diagnosed for having cellulitis in left leg, requiring hospitalization. Around the same time, pt also needed to have and was approved having C1-C2 fusion surgery on Nov 10, 25. She feels very tight on the back of the neck muscles, stating she cannot open her mouth to let food in all the way, and has difficulty chewing food. Reports staying 8 days in inpatient rehab after surgery. Staples removed 3 days ago. At this time her knee surgery is cancelled, and focus is on rehabilitating the neck.   PAIN:  Are you having pain? Yes: NPRS scale: 5/10 Pain location: back of neck , bil upper traps, upper anterior thoracic  Pain description: constant, throbbing, sharp, tight, electric  Aggravating factors: everything hurts Relieving factors: muscle relxer , pain meds   PRECAUTIONS: Other: Spinal stimulator    WEIGHT BEARING RESTRICTIONS: No  FALLS:  Has patient fallen in last 6 months? No  LIVING ENVIRONMENT: Lives with: lives alone Lives in: House/apartment Stairs: Yes: External: 4 steps; can reach both Has following equipment at home: None  OCCUPATION: Retired   PLOF: Independent  PATIENT GOALS: To move my neck more, less tightness, get the rocks out of my neck and head  NEXT MD VISIT: mid Jan 2026  OBJECTIVE:  Note: Objective measures were completed  at Evaluation unless otherwise noted.  DIAGNOSTIC FINDINGS:  None on file  PATIENT SURVEYS:  NDI: 62% (31/50)  COGNITION: Overall cognitive status: Within functional limits for tasks assessed  SENSATION: Not tested  POSTURE: rounded shoulders and forward head  PALPATION: Tender to palpate at bilateral upper traps Lt>Rt Tender:  -Bil lev scap Lt>Rt -suboccipitals - Bil Scalenes -Bil upper anterior thoracic region   CERVICAL ROM:   Active ROM A/PROM (deg) eval  Flexion 20 pain  Extension 15 pain  Right lateral flexion 10 pain  Left lateral flexion 30 pain  Right rotation 15 pain  Left rotation 11 pain   (Blank rows = not tested)  UPPER EXTREMITY ROM:  Active ROM Right eval Left eval  Shoulder flexion 130 145  Shoulder extension [facility]  Shoulder abduction [facility]  Shoulder adduction    Shoulder extension    Shoulder internal rotation [facility]  Shoulder external rotation [facility] WFL  Elbow flexion    Elbow extension    Wrist flexion    Wrist extension    Wrist ulnar deviation    Wrist radial deviation    Wrist pronation    Wrist supination     (Blank rows = not tested)  UPPER EXTREMITY MMT:  MMT Right eval Left eval  Shoulder flexion 4- 4  Shoulder extension    Shoulder abduction 4+ 4+  Shoulder adduction    Shoulder extension 4 4  Shoulder internal rotation 4+ 4+  Shoulder external rotation 4+ 4+  Middle trapezius    Lower trapezius    Elbow flexion 4 4  Elbow extension 4 4  Wrist flexion 4+ 4+  Wrist extension 4 4  Wrist ulnar deviation    Wrist radial deviation    Wrist pronation    Wrist supination  Grip strength     (Blank rows = not tested)   FUNCTIONAL TESTS:  5 times sit to stand: 19.32 sec   TREATMENT DATE:  [facility] Adult PT Treatment:                                                DATE: 12/02/24  Manual Therapy: STM, TPR cervical paraspinals, bilat UT, suboccipitals Gentle ROM c spine as tolerated Neuromuscular re-ed: Head  on coregeous ball: cervical rotation, cervical nods Cervical retraction as tolerated Therapeutic Activity: Posterior shoulder rolls sitting x 10 Scap squeeze sitting x 10 Seated thoracic rotation with physioball Seated cat/camel   [facility] Adult PT Treatment:                                                DATE: 11/13/24  Manual Therapy: STM cervical paraspinals, bilat UT, suboccipitals STM/TPR through pecs; upper traps Desentization for area of surgery; scalp; scar Gentle ROM c spine to tolerance Accupressure headache points  Neuromuscular re-ed: Supine and sitting cervical rotation, cervical nods to patient tolerance Therapeutic Activity: Chin tuck in supine gentle 5 sec x 10  Posterior shoulder rolls sitting x 10 Scap squeeze sitting x 10   [facility] Adult PT Treatment:                                                DATE: 11/11/24  Manual Therapy: STM, TPR cervical paraspinals, bilat UT, suboccipitals Gentle ROM c spine as tolerated Neuromuscular re-ed: Head on coregeous ball: cervical rotation, cervical nods Cervical retraction as tolerated Therapeutic Activity: Posterior shoulder rolls sitting x 10 Scap squeeze sitting x 10 Discussed continued POC and recommendations for activity modifications during heat pack  Modalities: Heat pack cervical x 10 minutes end of session Self Care: Incision with no redness or drainage. Appears closed at this time     PATIENT EDUCATION:  Education details: HEP and POC Person educated: Patient Education method: Explanation, Demonstration, Tactile cues, Verbal cues, and Handouts Education comprehension: verbalized understanding, returned demonstration, verbal cues required, tactile cues required, and needs further education  HOME EXERCISE PROGRAM: Access Code: [facility] URL: [email or web address] Date: 12/02/2024 Prepared by: Darice Conine  Exercises - Seated Cervical Sidebending AROM  - 1 x daily - 7 x weekly - 3 sets  - 10 reps - Seated Lumbar and Thoracic Forward Rotation with Anchored Resistance  - 1 x daily - 7 x weekly - 2 sets - 10 reps - Seated Cat Cow  - 1 x daily - 7 x weekly - 2 sets - 10 reps  ASSESSMENT:  CLINICAL IMPRESSION: Pt with decreased pain today. She has met 2/3 short term goals and states she feels she has more cervical mobility. Added thoracic rotation and cat cow to work on overall spinal mobility with pt with good tolerance. She is progressing well with current POC  OBJECTIVE IMPAIRMENTS: Abnormal gait, decreased activity tolerance, decreased balance, decreased coordination, decreased endurance, decreased mobility, decreased ROM, decreased strength, hypomobility, increased fascial restrictions, increased muscle spasms, impaired flexibility, impaired UE functional use, improper body mechanics, postural dysfunction, and pain.  GOALS: Goals reviewed with patient? Yes  SHORT TERM GOALS: Target date: 11/29/24  Pt will be efficient with initial HEP so she has increased independence for everyday activities.  Baseline:  Goal status: MET  2.  Pt will report no more than 6/10 neck pain so she has increased functional capacity for driving safely Baseline: 1/89 Goal status: MET 12/02/24   3.  Pt will improve 5 sit to stands test by completing in 14 sec or less so she has increased strength and mobility for community services Baseline:  Goal status: INITIAL    LONG TERM GOALS: Target date: 12/24/24  Pt will be efficient with advanced HEP so she has increased independence for everyday activities.  Baseline:  Goal status: INITIAL  2.  Pt will be able to increase cervical flexion and extension ROM at least 10 deg or more so she has increased functional capacity for everyday ADL's (ex: reading,etc) Baseline: flexion 20 deg, ext 15 deg  Goal status: INITIAL  3.  Pt will improve NDI survey by scoring at least 10 % improvement so she can lift objects for ADL's safely Baseline: 62%  (31/50) Goal status: INITIAL     PLAN:  PT FREQUENCY: 2x/week  PT DURATION: 8 weeks  PLANNED INTERVENTIONS: [id number]- PT Re-evaluation, 97750- Physical Performance Testing, 97110-Therapeutic exercises, 97530- Therapeutic activity, 97112- Neuromuscular re-education, 97535- Self Care, [id number]- Manual therapy, [id number]- Gait training, [phone]- Aquatic Therapy, [phone] (1-2 muscles), [phone] (3+ muscles)- Dry Needling, Patient/Family education, Balance training, Stair training, Joint mobilization, Spinal mobilization, Cryotherapy, and Moist heat  PLAN FOR NEXT SESSION: neck stretches as tolerated, thoracic mobility, deep neck flexor strengthening, median, ulnar, radial nerve mobilization, cervical flexion AROM   Uniqua Kihn, PT 12/02/2024, 4:13 PM      "

## 2024-12-03 ENCOUNTER — Ambulatory Visit (INDEPENDENT_AMBULATORY_CARE_PROVIDER_SITE_OTHER): Admitting: Professional

## 2024-12-03 ENCOUNTER — Encounter: Payer: Self-pay | Admitting: Professional

## 2024-12-03 DIAGNOSIS — F4321 Adjustment disorder with depressed mood: Secondary | ICD-10-CM | POA: Diagnosis not present

## 2024-12-03 DIAGNOSIS — F411 Generalized anxiety disorder: Secondary | ICD-10-CM | POA: Diagnosis not present

## 2024-12-03 DIAGNOSIS — F331 Major depressive disorder, recurrent, moderate: Secondary | ICD-10-CM

## 2024-12-03 NOTE — Progress Notes (Signed)
 "  [facility] Counselor/Therapist Progress Note  Patient ID: Heather Thompson, MRN: [id number],    Date: 12/03/2024  Time Spent:  39 minutes 1220-1259pm  Treatment Type: Individual Therapy  Risk Assessment: Danger to Self:  No Self-injurious Behavior: No Danger to Others: No  Subjective: This session was held via video teletherapy. The patient consented to audio teletherapy and was located at [facility] for this session. She is aware it is the responsibility of the patient to secure confidentiality on her end of the session. The provider was in a private office for the duration of this session.    The patient arrived on time for her in person session.  Issues addressed: 1-physical a-pt came [place] from [place] with the flu -she saw Zada -she is struggling to have the motivation to get up and fix something to eat -she had PT yesterday and that is the only time out of the house since she returned home on  [place] b-neck -five weeks on 1/5 since she had the surgery -pt reports she noticed a shift in how she was feeling when she was in [place] for vacation -she has pain due to the muscles needing to  2-[place]mas in [place] with daughter and son -trip was okay, there was nothing that wasn't good -Christmas Eve they make plans for Christmas Day with Particia, Nate and Josh, and she went for afternoon dinner and spent evening with her son Glendia and DIL -pt admits that things are different and she admits her depression worsened as it has for years -ot to see Hannah's first baby Brayden who turned [age], and she enjoyed that 3-mood -pt hates the mood she has gotten in, I just feel so discouraged -depressed at an 8 or 9 and suggests it might be a ten -feeling very alone and lonely -the clutter around my house is very very overwhelming, I don't have the energy physically and emotionally/mentally to do anything  Treatment Plan Problems: Financial Stress, Low Self-Esteem, Attention Deficit  Disorder (ADD) - Adult Symptoms: Unable to concentrate or pay attention to things of low interest, even when those things are important to his/her life. Restless and fidgety; unable to be sedentary for more than a short time. Disorganized in most areas of his/her life. Starts many projects but rarely finishes any. Chronic low self-esteem. Tendency toward addictive behaviors. Indebtedness and overdue bills that exceed ability to meet monthly payments. A long-term lack of discipline in money management that has led to excessive indebtedness. A pattern of impulsive spending that does not consider the eventual financial consequences. Inability to accept compliments. Makes self-disparaging remarks; sees self as unattractive, worthless, a loser, a burden, unimportant; takes blame easily. Difficulty in saying no to others; assumes not being liked by others. Fear of rejection by others, especially peer group. Lack of any goals for life and setting of inappropriately low goals for self. Inability to identify positive characteristics of self. Anxious and uncomfortable in social situations. Goals: Reduce impulsive actions while increasing concentration and focus on low-interest activities. Minimize ADD behavioral interference in daily life. Sustain attention and concentration for consistently longer periods of time. Achieve an inner strength to control personal impulses, cravings, and desires that directly or indirectly increase debt irresponsibly. Resolve financial crisis with a path to eliminate debt. Revise spending patterns to not exceed income. Gain a new sense of self-worth in which the substance of one's value is not attached to the capacity to do things or own things that cost money. Understand  personal desires, insecurities, and anxieties that make overspending possible. Elevate self-esteem. Develop a consistent, positive self-image. Demonstrate improved self-esteem through more pride in  appearance, more assertiveness, greater eye contact, and identification of positive traits in self-talk messages. Establish an inward sense of self-worth, confidence, and competence. Interact socially without undue distress or disability. Objectives target date for all objectives is 03/25/2025: Identify the current specific ADD behaviors that cause the most difficulty. List the negative consequences of the ADD problematic behavior. Learn and implement organization and planning skills. Acknowledge procrastination and the need to reduce it. Learn and implement skills to reduce procrastination. Learn and implement skills to reduce the disruptive influence of distractibility. Combine skills learned in therapy into a new daily approach to managing ADHD. Use cognitive and behavioral strategies to control the impulse to make unnecessary and unaffordable purchases. Report instances of successful control over impulse to spend on unnecessary expenses. Increase insight into the historical and current sources of low self-esteem. Decrease the frequency of negative self-descriptive statements and increase frequency of positive self-descriptive statements. Articulate a plan to be proactive in trying to get identified needs met. Form realistic, appropriate, and attainable goals for self in all areas of life. Take verbal responsibility for accomplishments without discounting. Identify and replace negative self-talk messages used to reinforce low self-esteem. Objectives target date for all objectives is 03/13/2025: Identify the specific anxiety symptoms that are personally most disturbing or most contributing to impaired functioning. Learn and practice thought and behavioral control methods to minimize and control anxiety symptoms once they have begun. Identify specific thoughts that precipitate anxiety symptoms. Replace anxiety-producing thoughts with constructive thoughts. Verbalize an understanding of the  general physical and cognitive manifestations of the causes for anxiety. Identify and clarify the patterns of anxiety precipitants and consequences. Identify whether the symptoms of depression seem to be primarily related to interpersonal relationships, stressful life events or circumstances, thoughts/beliefs, or behaviors. Verbalize an understanding of the general physical and psychological effects of depression. Replace depression-promoting thoughts with mood-elevating thoughts. Keep a daily record of mood rating from 1 to 10, noting associated behaviors, activities, events, people, and thoughts. Identify specific thoughts that precipitate depressive moods. Monitor and report depressive symptoms, completing self-report assessment on a periodic basis. Describe and evaluate significant past and current interpersonal relationships. Tell memories of the deceased person, starting with positive memories. Consider possible ways of increasing involvement with others. Accept the reality of loss and tolerate the pain of intense grief. Engage in events and activities that increase level of social involvements. Identify and monitor specific pain triggers. Learn and implement somatic skills such as relaxation and/or biofeedback to reduce pain level. Learn mental coping skills and implement with somatic skills for managing acute pain. Interventions: Reinforce the client's belief system and/or refer to a spiritual leader who can support the client's faith. Explore and problem-solve with the client difficulties, fears, or barriers related to increasing social involvement. Point out to the client that engagement with others is associated with decreased feelings of loneliness, grief, depression, and hopelessness. Provide encouragement and support to the client for increased engagement in contact with others. Clarify with the client that expression of negative thoughts or feelings about the deceased is not  disloyal. Assist the client in remembering the loved one in realistic ways (i.e., includes attractions, positive traits, negative traits, conflicts, ambivalence, dependency, etc.), a memory that is neither idealized nor demonized, so that the client's relationship with the deceased person can be appraised realistically (or assign Dear _____: A Letter  to a Lost Loved One in the Adult Psychotherapy Homework Planner, 2nd ed. by Jenniffer). Assign the client to read about progressive muscle relaxation and other calming strategies in relevant books or treatment manuals (e.g., Progressive Relaxation Training by Thornell armin Collier). Assign a homework exercise in which the client implements somatic pain management skills and records the result; review and process during the treatment session. Teach the client distraction techniques (e.g., pleasant imagery, counting techniques, alternative focal points) and how to use them for relaxation skills for the management of acute episodes of pain. Ask the client to create a list of activities that are pleasurable to him/her (or assign Identify and Schedule Pleasant Activities in the Adult Psychotherapy Homework Planner, 2nd ed. by Jenniffer); process the list, developing a plan of increasing the frequency of engaging in the selected pleasurable activities. Teach the client problem-solving skills to apply to removal of obstacles to implementing new skills; role-play implementation of these skills to obstacles. Assist the client in identifying the most effective personal stress management techniques (e.g., prayer, walking, baking, telephoning a friend), and encourage daily scheduling of these activities (or assign Past Successful Anxiety Coping in the Adult Psychotherapy Homework Planner, 2nd ed. by Jenniffer). Teach the client the concept of finding a good match between the individual's capacities and the demands of the physical environment. If the physical environment is  too demanding for the individual's capacities (e.g., frail older adult taking care of a large house), the individual can become overwhelmed and may need to move from large home to smaller accommodations or residence for older adults). While reviewing the client's anxiety symptom chart, help him/her recognize patterns associated with anxiety symptoms: sort out precipitants from consequences, identify the most intense or frequent precipitants, and identify the consequences that help to perpetuate maladaptive patterns. Assist the client in identifying his/her current attempts to reducing anxiety (e.g., constantly telephoning family or physician, making unneeded doctor appointments or going to the emergency room), their apparent positive consequences (e.g., feeling better, getting attention), but longer-term negative consequences (e.g., physician won't return calls, friends avoid the client because of expressions of worry). Teach the client that his/her mood can be improved by increasing pleasant events and decreasing unpleasant events. Encourage the client to identify pleasant events that are desirable, but are not currently a part of his/her daily routine (see Identify and Schedule Pleasant Activities in the Adult Psychotherapy Homework Planner, 2nd ed. by Jenniffer). Develop a one-week daily schedule with the client that increases pleasant events and decreases unpleasant events, making sure to have at least one pleasant event every day. Help the client to understand his/her strengths and weaknesses in problem-solving, to view problems as challenges and not personal deficits, and to identify and define current problem(s) that will be the focus of treatment. Help the client to identify realistic goals to address problem(s) of concern and identify major obstacles in achieving goals. Encourage the client to implement chosen solutions to current life problem(s), self-monitor and evaluate solution implementation,  and reward self for efforts. Explore the relationship of negative self-talk (e.g., I'm just a burden to everyone; Everyone would be better off if I were dead) and distorted beliefs (e.g., There's no one like me living here; I'd rather never leave my room than have anyone see me in a wheelchair) to depressed mood (or assign Negative Thoughts Trigger Negative Feelings in the Adult Psychotherapy Homework Planner, 2nd ed. by Jenniffer; or Daily Record of Dysfunctional Thoughts in Cognitive Therapy of Depression by Almarie Candida Gentry, and  Shona). Encourage the client to distinguish between all-or-nothing thinking (e.g., Life in a nursing home can't be worth living) and genuine nuanced reflection on life's meaning and purpose (e.g., What's giving my life meaning at this stage?); confront the former, encourage the latter.  Diagnosis:Major depressive disorder, recurrent episode, moderate (HCC)  Generalized anxiety disorder  Grief  Plan:  -pt will call church office and see if somebody can visit and somebody to help with home -meet again on Thursday, December 18, 2024 at 11am in person  "

## 2024-12-05 ENCOUNTER — Ambulatory Visit: Admitting: Physical Therapy

## 2024-12-05 ENCOUNTER — Encounter: Payer: Self-pay | Admitting: Physical Therapy

## 2024-12-05 DIAGNOSIS — M542 Cervicalgia: Secondary | ICD-10-CM | POA: Diagnosis not present

## 2024-12-05 NOTE — Therapy (Signed)
 " OUTPATIENT PHYSICAL THERAPY CERVICAL TREATMENT   Patient Name: Heather Thompson MRN: [id number] DOB:07/29/1951, 74 y.o., female Today's Date: 12/05/2024  END OF SESSION:  PT End of Session - 12/05/24 1147     Visit Number 7    Number of Visits 17    Date for Recertification  12/24/24    Authorization Type Medicare Part A and B    Authorization - Visit Number 7    Progress Note Due on Visit 10    PT Start Time 1103    PT Stop Time 1150    PT Time Calculation (min) 47 min    Activity Tolerance Patient tolerated treatment well    Behavior During Therapy WFL for tasks assessed/performed              Past Medical History:  Diagnosis Date   Abnormality of gait 04/23/2019   Acquired hypothyroidism 07/29/2021   Allergic rhinitis due to animal hair and dander 12/02/2015   Formatting of this note might be different from the original.  [facility]  Seen by Dr. Leita 11/11/15     Plan- Received allergy shots today. Follow up 1 year.     Allergy    Anemia    Anemia    Anxiety    Arthritis of scaphoid-trapezium-trapezoid joint of right hand 09/10/2023   B12 deficiency    Back pain    Chronic fatigue 07/29/2021   Chronic fatigue syndrome    Chronic throat clearing 03/14/2023   Clotting disorder    Constipation    Depression with anxiety 04/07/2009   Formatting of this note might be different from the original.  well controlled on current meds, follows with psychiatrist in [place]. Had a   h/o severe depression 5 years ago, and had ECT.     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  B12 deficiency   Iron deficiency     Herniated lumbar intervertebral disc  11/12/2020   High cholesterol    History of blood clots    History of cervical spinal surgery 01/01/2018   Formatting of this note might be different from the original.  Fusion C6 and C7 per 2018 MRI report.     History of left hip replacement 01/14/2018   History of Roux-en-Y gastric bypass    Hoarseness 03/14/2023   Hyperlipidemia 08/19/2010   Hypertension    Hyponatremia 01/17/2018   Hypothyroidism    Hypothyroidism    IBS (irritable bowel syndrome) 04/06/2021   Impaired fasting glucose 08/19/2010   Incontinence 10/30/2013   Formatting of this note might be different from the original. Formatting of this note might be different from the original. Urinary Incontinence     Infertility, female    Insomnia 04/07/2009   Formatting of this note might be different from the original.  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  Followed by [facility]  Seen by Dr.  Linnette 03/01/15     S/p changing thoracic spinal cord stimulator generator 08/18/14.     Lumbar spine films show spinal cord generator in left flank area. 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  Follows with Rheumatology in [facility] of this note might be different from the original.  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  Getting iron infusion via port at hematalogy/oncology center at [facility].   Was recently hospitalized in [facility] for severe anemia     Formatting of this note might be different from the original.  on OTC b12 currently     Vitamin D  deficiency    Past Surgical History:  Procedure Laterality Date   ABDOMINAL HYSTERECTOMY     APPENDECTOMY     CARPAL TUNNEL RELEASE Bilateral 2023   with thumb joint replacement   CERVICAL FUSION      CHOLECYSTECTOMY     FOOT SURGERY Bilateral    fusion   GASTRIC BYPASS     IR EMBO ARTERIAL NOT HEMORR HEMANG INC GUIDE ROADMAPPING  04/01/2024   IR RADIOLOGIST EVAL & MGMT  03/14/2024   IR RADIOLOGIST EVAL & MGMT  04/24/2024   SPINAL CORD STIMULATOR INSERTION  07/2023   TOTAL HIP ARTHROPLASTY Bilateral    11/2016 & 05/2017   Patient Active Problem List   Diagnosis Date Noted   Status post cervical spinal fusion 12/02/2024   Onychodystrophy 08/14/2024   Oral candidiasis 07/08/2024   Chronic bilateral hip pain after total replacement of both hip joints 06/18/2024   Left leg pain 06/18/2024  Mononeuropathy 12/14/2023   Palpitations 10/04/2023   Cardiac murmur 10/04/2023   Allergy    Anemia    Anxiety    B12 deficiency    Back pain    Chronic fatigue syndrome    Clotting disorder    Constipation    Edema of both lower extremities    Fatty liver    Fibromyalgia    Gallbladder problem    High cholesterol    History of blood clots    History of Roux-en-Y gastric bypass    Hypothyroidism    Joint pain    Kidney stones    Muscle disorder    Osteoarthritis    Prediabetes    Salzmann nodular degeneration    SOBOE (shortness of breath on exertion)    Swallowing difficulty    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   Arthritis of scaphoid-trapezium-trapezoid joint of right hand 09/10/2023   Urge incontinence 08/28/2023   OAB (overactive bladder) 08/28/2023   Lumbar post-laminectomy syndrome 07/06/2023   Snapping hip syndrome, 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   Other myositis, left thigh 04/24/2023   Sacroiliitis, not elsewhere classified 04/24/2023   Dysphagia 04/17/2023   Elevated liver enzymes 04/02/2023   Elevated TSH 04/02/2023   Chronic throat clearing 03/14/2023   Globus sensation 03/14/2023   Hoarseness 03/14/2023   Muscle tension dysphonia 03/12/2023   Unspecified inflammatory spondylopathy, 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   Primary osteoarthritis of both knees 12/21/2021    Port-A-Cath in place 07/29/2021   Acquired hypothyroidism 07/29/2021   Chronic pain syndrome 07/29/2021   Vitamin B12 deficiency 07/29/2021   Vitamin D  deficiency 07/29/2021   Postmenopausal estrogen deficiency 07/29/2021   IBS (irritable bowel syndrome) 04/06/2021   Retention of urine 04/06/2021   Herniated lumbar intervertebral disc 11/12/2020   Skin sensation disturbance 11/12/2020   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   Spondylopathy, unspecified 11/12/2020   Trochanteric bursitis of both hips 11/19/2019   Low serum iron 04/23/2019   Osteomalacia 04/23/2019   Abnormality of gait 04/23/2019   History of bilateral hip arthroplasty 06/26/2018   Gastroesophageal reflux disease 06/18/2018   Hypertension 06/18/2018   Intertrigo 04/30/2018   Lentigines 04/30/2018   Hyponatremia 01/17/2018   History of cervical spinal surgery 01/01/2018   Obesity (BMI 30-39.9) 01/01/2018   SK (seborrheic keratosis) 04/18/2017   Pain management contract agreement 11/17/2016   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   Allergic rhinitis due to animal hair and dander 12/02/2015   Low back pain 03/03/2015   Incontinence 10/30/2013   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   Neutrophilic leukocytosis 08/12/2012   Thrombocytosis 08/12/2012   Osteoporosis, senile 11/15/2010   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   Hyperlipidemia 08/19/2010   Impaired fasting glucose 08/19/2010   Polyarthritis 09/28/2009   Degenerative disc disease 04/07/2009   Depression with anxiety 04/07/2009   Myopathy 04/07/2009   Insomnia 04/07/2009   Migraine 04/07/2009   Scoliosis 04/07/2009   Seasonal allergies 04/07/2009   Vitamin B12 deficiency anemia 04/07/2009    PCP: No PCP  REFERRING PROVIDER: Zada Palin, NP  REFERRING DIAG: M45 Ankylosing Spondylitis of cervical region M50.11 Cervical radiculopathy M79.7 Fibromyalgia  THERAPY DIAG:  Cervicalgia  Rationale for Evaluation and Treatment: Rehabilitation  ONSET DATE: November 10    SUBJECTIVE:  SUBJECTIVE STATEMENT: Pt with no new complaints  Hand dominance: Left  PERTINENT HISTORY:  See above  Spinal stimulator C1-C2 fusion   Pt reports she was supposed to have knee surgery in August, however the week before, she was diagnosed for having cellulitis in left leg, requiring hospitalization. Around the same time, pt also needed to have and was approved having C1-C2 fusion surgery on Nov 10, 25. She feels very tight on the back of the neck muscles, stating she cannot open her mouth to let food in all the way, and has difficulty chewing food. Reports staying 8 days in inpatient rehab after surgery. Staples removed 3 days ago. At this time her knee surgery is cancelled, and focus is on rehabilitating the neck.   PAIN:  Are you having pain? Yes: NPRS scale: 5/10 Pain location: back of neck , bil upper traps, upper anterior thoracic  Pain description: constant, throbbing, sharp, tight, electric  Aggravating factors: everything hurts Relieving factors: muscle relxer , pain meds   PRECAUTIONS: Other: Spinal stimulator    WEIGHT BEARING RESTRICTIONS: No  FALLS:  Has patient fallen in last 6 months? No  LIVING ENVIRONMENT: Lives with: lives alone Lives in: House/apartment Stairs: Yes: External: 4 steps; can reach both Has following equipment at home: None  OCCUPATION: Retired   PLOF: Independent  PATIENT GOALS: To move my neck more, less tightness, get the rocks out of my neck and head  NEXT MD VISIT: mid Jan 2026  OBJECTIVE:  Note: Objective measures were completed at Evaluation unless otherwise noted.  DIAGNOSTIC FINDINGS:  None on file  PATIENT SURVEYS:  NDI: 62% (31/50)  COGNITION: Overall cognitive status: Within functional limits for tasks  assessed  SENSATION: Not tested  POSTURE: rounded shoulders and forward head  PALPATION: Tender to palpate at bilateral upper traps Lt>Rt Tender:  -Bil lev scap Lt>Rt -suboccipitals - Bil Scalenes -Bil upper anterior thoracic region   CERVICAL ROM:   Active ROM A/PROM (deg) eval  Flexion 20 pain  Extension 15 pain  Right lateral flexion 10 pain  Left lateral flexion 30 pain  Right rotation 15 pain  Left rotation 11 pain   (Blank rows = not tested)  UPPER EXTREMITY ROM:  Active ROM Right eval Left eval  Shoulder flexion 130 145  Shoulder extension [facility] WFL  Shoulder abduction [facility]  Shoulder adduction    Shoulder extension    Shoulder internal rotation [facility] WFL  Shoulder external rotation [facility] WFL  Elbow flexion    Elbow extension    Wrist flexion    Wrist extension    Wrist ulnar deviation    Wrist radial deviation    Wrist pronation    Wrist supination     (Blank rows = not tested)  UPPER EXTREMITY MMT:  MMT Right eval Left eval  Shoulder flexion 4- 4  Shoulder extension    Shoulder abduction 4+ 4+  Shoulder adduction    Shoulder extension 4 4  Shoulder internal rotation 4+ 4+  Shoulder external rotation 4+ 4+  Middle trapezius    Lower trapezius    Elbow flexion 4 4  Elbow extension 4 4  Wrist flexion 4+ 4+  Wrist extension 4 4  Wrist ulnar deviation    Wrist radial deviation    Wrist pronation    Wrist supination    Grip strength     (Blank rows = not tested)   FUNCTIONAL TESTS:  5 times sit to stand: 19.32 sec   TREATMENT DATE:  [facility] Adult PT Treatment:                                                DATE: 12/05/24  Manual Therapy: STM, TPR cervical paraspinals, bilat UT, suboccipitals Gentle ROM c spine as tolerated Neuromuscular re-ed: Supine nod, rotation Supine horizontal abd yellow TB x 10 Supine bilat shoulder flexion holding yellow TB x 10 Therapeutic Activity: Seated thoracic rotation with physioball Scap squeeze x  10 Seated cat/camel  Modalities: Moist heat end of session   [facility] Adult PT Treatment:                                                DATE: 12/02/24  Manual Therapy: STM, TPR cervical paraspinals, bilat UT, suboccipitals Gentle ROM c spine as tolerated Neuromuscular re-ed: Head on coregeous ball: cervical rotation, cervical nods Cervical retraction as tolerated Therapeutic Activity: Posterior shoulder rolls sitting x 10 Scap squeeze sitting x 10 Seated thoracic rotation with physioball Seated cat/camel   [facility] Adult PT Treatment:                                                DATE: 11/13/24  Manual Therapy: STM cervical paraspinals, bilat UT, suboccipitals STM/TPR through pecs; upper traps Desentization for area of surgery; scalp; scar Gentle ROM c spine to tolerance Accupressure headache points  Neuromuscular re-ed: Supine and sitting cervical rotation, cervical nods to patient tolerance Therapeutic Activity: Chin tuck in supine gentle 5 sec x 10  Posterior shoulder rolls sitting x 10 Scap squeeze sitting x 10    PATIENT EDUCATION:  Education details: HEP and POC Person educated: Patient Education method: Programmer, Multimedia, Demonstration, Tactile cues, Verbal cues, and Handouts Education comprehension: verbalized understanding, returned demonstration, verbal cues required, tactile cues required, and needs further education  HOME EXERCISE PROGRAM: Access Code: [facility] URL: [email or web address] Date: 12/02/2024 Prepared by: Darice Conine  Exercises - Seated Cervical Sidebending AROM  - 1 x daily - 7 x weekly - 3 sets - 10 reps - Seated Lumbar and Thoracic Forward Rotation with Anchored Resistance  - 1 x daily - 7 x weekly - 2 sets - 10 reps - Seated Cat Cow  - 1 x daily - 7 x weekly - 2 sets - 10 reps  ASSESSMENT:  CLINICAL IMPRESSION: Pt with good response to manual work. Added supine postural strengthening to tolerance. Continued to progress  thoracic mobility with good response to treatment.  OBJECTIVE IMPAIRMENTS: Abnormal gait, decreased activity tolerance, decreased balance, decreased coordination, decreased endurance, decreased mobility, decreased ROM, decreased strength, hypomobility, increased fascial restrictions, increased muscle spasms, impaired flexibility, impaired UE functional use, improper body mechanics, postural dysfunction, and pain.    GOALS: Goals reviewed with patient? Yes  SHORT TERM GOALS: Target date: 11/29/24  Pt will be efficient with initial HEP so she has increased independence for everyday activities.  Baseline:  Goal status: MET  2.  Pt will report no more than 6/10 neck pain so she has increased functional capacity for driving safely Baseline: 1/89 Goal status: MET 12/02/24   3.  Pt will improve 5  sit to stands test by completing in 14 sec or less so she has increased strength and mobility for community services Baseline:  Goal status: INITIAL    LONG TERM GOALS: Target date: 12/24/24  Pt will be efficient with advanced HEP so she has increased independence for everyday activities.  Baseline:  Goal status: INITIAL  2.  Pt will be able to increase cervical flexion and extension ROM at least 10 deg or more so she has increased functional capacity for everyday ADL's (ex: reading,etc) Baseline: flexion 20 deg, ext 15 deg  Goal status: INITIAL  3.  Pt will improve NDI survey by scoring at least 10 % improvement so she can lift objects for ADL's safely Baseline: 62% (31/50) Goal status: INITIAL     PLAN:  PT FREQUENCY: 2x/week  PT DURATION: 8 weeks  PLANNED INTERVENTIONS: 97164- PT Re-evaluation, 97750- Physical Performance Testing, 97110-Therapeutic exercises, 97530- Therapeutic activity, 97112- Neuromuscular re-education, 97535- Self Care, [id number]- Manual therapy, [id number]- Gait training, [id number]- Aquatic Therapy, [id number] (1-2 muscles), 20561 (3+ muscles)- Dry Needling, Patient/Family education,  Balance training, Stair training, Joint mobilization, Spinal mobilization, Cryotherapy, and Moist heat  PLAN FOR NEXT SESSION: neck stretches as tolerated, thoracic mobility, deep neck flexor strengthening, median, ulnar, radial nerve mobilization, cervical flexion AROM, ultrasound?   Rayden Dock, PT 12/05/2024, 11:48 AM      "

## 2024-12-09 ENCOUNTER — Encounter: Payer: Self-pay | Admitting: Physical Therapy

## 2024-12-09 ENCOUNTER — Ambulatory Visit: Admitting: Physical Therapy

## 2024-12-09 DIAGNOSIS — M542 Cervicalgia: Secondary | ICD-10-CM | POA: Diagnosis not present

## 2024-12-09 NOTE — Therapy (Signed)
 " OUTPATIENT PHYSICAL THERAPY CERVICAL TREATMENT   Patient Name: Heather Thompson MRN: [id number] DOB:[birth year], 74 y.o., female Today's Date: 12/09/2024  END OF SESSION:  PT End of Session - 12/09/24 1449     Visit Number 8    Number of Visits 17    Date for Recertification  12/24/24    Authorization Type Medicare Part A and B    Authorization - Visit Number 8    Progress Note Due on Visit 10    PT Start Time 1400    PT Stop Time 1453    PT Time Calculation (min) 53 min    Activity Tolerance Patient tolerated treatment well    Behavior During Therapy WFL for tasks assessed/performed               Past Medical History:  Diagnosis Date   Abnormality of gait 04/23/2019   Acquired hypothyroidism 07/29/2021   Allergic rhinitis due to animal hair and dander 12/02/2015   Formatting of this note might be different from the original.  [facility]  Seen by Dr. Leita 11/11/15     Plan- Received allergy shots today. Follow up 1 year.     Allergy    Anemia    Anemia    Anxiety    Arthritis of scaphoid-trapezium-trapezoid joint of right hand 09/10/2023   B12 deficiency    Back pain    Chronic fatigue 07/29/2021   Chronic fatigue syndrome    Chronic throat clearing 03/14/2023   Clotting disorder    Constipation    Depression with anxiety 04/07/2009   Formatting of this note might be different from the original.  well controlled on current meds, follows with psychiatrist in [place]. Had a   h/o severe depression 5 years ago, and had ECT.     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  B12 deficiency   Iron deficiency     Herniated lumbar intervertebral  disc 11/12/2020   High cholesterol    History of blood clots    History of cervical spinal surgery 01/01/2018   Formatting of this note might be different from the original.  Fusion C6 and C7 per 2018 MRI report.     History of left hip replacement 01/14/2018   History of Roux-en-Y gastric bypass    Hoarseness 03/14/2023   Hyperlipidemia 08/19/2010   Hypertension    Hyponatremia 01/17/2018   Hypothyroidism    Hypothyroidism    IBS (irritable bowel syndrome) 04/06/2021   Impaired fasting glucose 08/19/2010   Incontinence 10/30/2013   Formatting of this note might be different from the original. Formatting of this note might be different from the original. Urinary Incontinence     Infertility, female    Insomnia 04/07/2009   Formatting of this note might be different from the original.  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  Followed by [facility]  Seen by  Dr. Linnette 03/01/15     S/p changing thoracic spinal cord stimulator generator 08/18/14.     Lumbar spine films show spinal cord generator in left flank area. 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  Follows with Rheumatology in [facility] of this note might be different from the original.  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  Getting iron infusion via port at hematalogy/oncology center at [facility].   Was recently hospitalized in [facility] for severe anemia     Formatting of this note might be different from the original.  on OTC b12 currently     Vitamin D  deficiency    Past Surgical History:  Procedure Laterality Date   ABDOMINAL HYSTERECTOMY     APPENDECTOMY     CARPAL TUNNEL RELEASE Bilateral 2023   with thumb joint replacement   CERVICAL FUSION      CHOLECYSTECTOMY     FOOT SURGERY Bilateral    fusion   GASTRIC BYPASS     IR EMBO ARTERIAL NOT HEMORR HEMANG INC GUIDE ROADMAPPING  04/01/2024   IR RADIOLOGIST EVAL & MGMT  03/14/2024   IR RADIOLOGIST EVAL & MGMT  04/24/2024   SPINAL CORD STIMULATOR INSERTION  07/2023   TOTAL HIP ARTHROPLASTY Bilateral    11/2016 & 05/2017   Patient Active Problem List   Diagnosis Date Noted   Status post cervical spinal fusion 12/02/2024   Onychodystrophy 08/14/2024   Oral candidiasis 07/08/2024   Chronic bilateral hip pain after total replacement of both hip joints 06/18/2024   Left leg pain  06/18/2024   Mononeuropathy 12/14/2023   Palpitations 10/04/2023   Cardiac murmur 10/04/2023   Allergy    Anemia    Anxiety    B12 deficiency    Back pain    Chronic fatigue syndrome    Clotting disorder    Constipation    Edema of both lower extremities    Fatty liver    Fibromyalgia    Gallbladder problem    High cholesterol    History of blood clots    History of Roux-en-Y gastric bypass    Hypothyroidism    Joint pain    Kidney stones    Muscle disorder    Osteoarthritis    Prediabetes    Salzmann nodular degeneration    SOBOE (shortness of breath on exertion)    Swallowing difficulty    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   Arthritis of scaphoid-trapezium-trapezoid joint of right hand 09/10/2023   Urge incontinence 08/28/2023   OAB (overactive bladder) 08/28/2023   Lumbar post-laminectomy syndrome 07/06/2023   Snapping hip syndrome, 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   Other myositis, left thigh 04/24/2023   Sacroiliitis, not elsewhere classified 04/24/2023   Dysphagia 04/17/2023   Elevated liver enzymes 04/02/2023   Elevated TSH 04/02/2023   Chronic throat clearing 03/14/2023   Globus sensation 03/14/2023   Hoarseness 03/14/2023   Muscle tension dysphonia 03/12/2023   Unspecified inflammatory spondylopathy, 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   Primary osteoarthritis of both knees 12/21/2021    Port-A-Cath in place 07/29/2021   Acquired hypothyroidism 07/29/2021   Chronic pain syndrome 07/29/2021   Vitamin B12 deficiency 07/29/2021   Vitamin D  deficiency 07/29/2021   Postmenopausal estrogen deficiency 07/29/2021   IBS (irritable bowel syndrome) 04/06/2021   Retention of urine 04/06/2021   Herniated lumbar intervertebral disc 11/12/2020   Skin sensation disturbance 11/12/2020   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   Spondylopathy, unspecified 11/12/2020   Trochanteric bursitis of both hips 11/19/2019   Low serum iron 04/23/2019   Osteomalacia 04/23/2019   Abnormality of gait 04/23/2019   History of bilateral hip arthroplasty 06/26/2018   Gastroesophageal reflux disease 06/18/2018   Hypertension 06/18/2018   Intertrigo 04/30/2018   Lentigines 04/30/2018   Hyponatremia 01/17/2018   History of cervical spinal surgery 01/01/2018   Obesity (BMI 30-39.9) 01/01/2018   SK (seborrheic keratosis) 04/18/2017   Pain management contract agreement 11/17/2016   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   Allergic rhinitis due to animal hair and dander 12/02/2015   Low back pain 03/03/2015   Incontinence 10/30/2013   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   Neutrophilic leukocytosis 08/12/2012   Thrombocytosis 08/12/2012   Osteoporosis, senile 11/15/2010   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   Hyperlipidemia 08/19/2010   Impaired fasting glucose 08/19/2010   Polyarthritis 09/28/2009   Degenerative disc disease 04/07/2009   Depression with anxiety 04/07/2009   Myopathy 04/07/2009   Insomnia 04/07/2009   Migraine 04/07/2009   Scoliosis 04/07/2009   Seasonal allergies 04/07/2009   Vitamin B12 deficiency anemia 04/07/2009    PCP: No PCP  REFERRING PROVIDER: Zada Palin, NP  REFERRING DIAG: M45 Ankylosing Spondylitis of cervical region M50.11 Cervical radiculopathy M79.7 Fibromyalgia  THERAPY DIAG:  Cervicalgia  Rationale for Evaluation and Treatment: Rehabilitation  ONSET DATE: November 10    SUBJECTIVE:  SUBJECTIVE STATEMENT: Pt saw surgeon yesterday who states she is doing well.  Hand dominance: Left  PERTINENT HISTORY:  See above  Spinal stimulator C1-C2 fusion   Pt reports she was supposed to have knee surgery in August, however the week before, she was diagnosed for having cellulitis in left leg, requiring hospitalization. Around the same time, pt also needed to have and was approved having C1-C2 fusion surgery on Nov 10, 25. She feels very tight on the back of the neck muscles, stating she cannot open her mouth to let food in all the way, and has difficulty chewing food. Reports staying 8 days in inpatient rehab after surgery. Staples removed 3 days ago. At this time her knee surgery is cancelled, and focus is on rehabilitating the neck.   PAIN:  Are you having pain? Yes: NPRS scale: 5/10 Pain location: back of neck , bil upper traps, upper anterior thoracic  Pain description: constant, throbbing, sharp, tight, electric  Aggravating factors: everything hurts Relieving factors: muscle relxer , pain meds   PRECAUTIONS: Other: Spinal stimulator    WEIGHT BEARING RESTRICTIONS: No  FALLS:  Has patient fallen in last 6 months? No  LIVING ENVIRONMENT: Lives with: lives alone Lives in: House/apartment Stairs: Yes: External: 4 steps; can reach both Has following equipment at home: None  OCCUPATION: Retired   PLOF: Independent  PATIENT GOALS: To move my neck more, less tightness, get the rocks out of my neck and head  NEXT MD VISIT: mid Jan 2026  OBJECTIVE:  Note: Objective measures were completed at Evaluation unless otherwise noted.  DIAGNOSTIC FINDINGS:  None on file  PATIENT SURVEYS:  NDI: 62% (31/50)  COGNITION: Overall cognitive status: Within  functional limits for tasks assessed  SENSATION: Not tested  POSTURE: rounded shoulders and forward head  PALPATION: Tender to palpate at bilateral upper traps Lt>Rt Tender:  -Bil lev scap Lt>Rt -suboccipitals - Bil Scalenes -Bil upper anterior thoracic region   CERVICAL ROM:   Active ROM A/PROM (deg) eval  Flexion 20 pain  Extension 15 pain  Right lateral flexion 10 pain  Left lateral flexion 30 pain  Right rotation 15 pain  Left rotation 11 pain   (Blank rows = not tested)  UPPER EXTREMITY ROM:  Active ROM Right eval Left eval  Shoulder flexion 130 145  Shoulder extension [facility] WFL  Shoulder abduction [facility]  Shoulder adduction    Shoulder extension    Shoulder internal rotation [facility] WFL  Shoulder external rotation [facility] WFL  Elbow flexion    Elbow extension    Wrist flexion    Wrist extension    Wrist ulnar deviation    Wrist radial deviation    Wrist pronation    Wrist supination     (Blank rows = not tested)  UPPER EXTREMITY MMT:  MMT Right eval Left eval  Shoulder flexion 4- 4  Shoulder extension    Shoulder abduction 4+ 4+  Shoulder adduction    Shoulder extension 4 4  Shoulder internal rotation 4+ 4+  Shoulder external rotation 4+ 4+  Middle trapezius    Lower trapezius    Elbow flexion 4 4  Elbow extension 4 4  Wrist flexion 4+ 4+  Wrist extension 4 4  Wrist ulnar deviation    Wrist radial deviation    Wrist pronation    Wrist supination    Grip strength     (Blank rows = not tested)   FUNCTIONAL TESTS:  5 times sit to stand: 19.32  sec   TREATMENT DATE:  [facility] Adult PT Treatment:                                                DATE: 12/09/24  Manual Therapy: STM, TPR cervical paraspinals, bilat UT, suboccipitals Gentle ROM c spine as tolerated Neuromuscular re-ed: Supine nod, rotation Therapeutic Activity: Seated thoracic rotation with physioball Scap squeeze x 10 Seated cat/camel  Modalities: Moist heat end of  session Self Care: Pt educated on dry needling as MD recommended this as a treatment option  [facility] Adult PT Treatment:                                                DATE: 12/05/24  Manual Therapy: STM, TPR cervical paraspinals, bilat UT, suboccipitals Gentle ROM c spine as tolerated Neuromuscular re-ed: Supine nod, rotation Supine horizontal abd yellow TB x 10 Supine bilat shoulder flexion holding yellow TB x 10 Therapeutic Activity: Seated thoracic rotation with physioball Scap squeeze x 10 Seated cat/camel  Modalities: Moist heat end of session   [facility] Adult PT Treatment:                                                DATE: 12/02/24  Manual Therapy: STM, TPR cervical paraspinals, bilat UT, suboccipitals Gentle ROM c spine as tolerated Neuromuscular re-ed: Head on coregeous ball: cervical rotation, cervical nods Cervical retraction as tolerated Therapeutic Activity: Posterior shoulder rolls sitting x 10 Scap squeeze sitting x 10 Seated thoracic rotation with physioball Seated cat/camel   [facility] Adult PT Treatment:                                                DATE: 11/13/24  Manual Therapy: STM cervical paraspinals, bilat UT, suboccipitals STM/TPR through pecs; upper traps Desentization for area of surgery; scalp; scar Gentle ROM c spine to tolerance Accupressure headache points  Neuromuscular re-ed: Supine and sitting cervical rotation, cervical nods to patient tolerance Therapeutic Activity: Chin tuck in supine gentle 5 sec x 10  Posterior shoulder rolls sitting x 10 Scap squeeze sitting x 10    PATIENT EDUCATION:  Education details: HEP and POC Person educated: Patient Education method: Programmer, Multimedia, Demonstration, Tactile cues, Verbal cues, and Handouts Education comprehension: verbalized understanding, returned demonstration, verbal cues required, tactile cues required, and needs further education  HOME EXERCISE PROGRAM: Access Code: [facility] URL:  [email or web address] Date: 12/02/2024 Prepared by: Darice Conine  Exercises - Seated Cervical Sidebending AROM  - 1 x daily - 7 x weekly - 3 sets - 10 reps - Seated Lumbar and Thoracic Forward Rotation with Anchored Resistance  - 1 x daily - 7 x weekly - 2 sets - 10 reps - Seated Cat Cow  - 1 x daily - 7 x weekly - 2 sets - 10 reps  ASSESSMENT:  CLINICAL IMPRESSION: Pt interested in dry needling after a recommendation from MD. PT spent time educating pt on dry needling and provided  hand out. Pt continues with mm spasticity which responds well to manual work. Continues with good tolerance to thoracic and cervical mobility  OBJECTIVE IMPAIRMENTS: Abnormal gait, decreased activity tolerance, decreased balance, decreased coordination, decreased endurance, decreased mobility, decreased ROM, decreased strength, hypomobility, increased fascial restrictions, increased muscle spasms, impaired flexibility, impaired UE functional use, improper body mechanics, postural dysfunction, and pain.    GOALS: Goals reviewed with patient? Yes  SHORT TERM GOALS: Target date: 11/29/24  Pt will be efficient with initial HEP so she has increased independence for everyday activities.  Baseline:  Goal status: MET  2.  Pt will report no more than 6/10 neck pain so she has increased functional capacity for driving safely Baseline: 1/89 Goal status: MET 12/02/24   3.  Pt will improve 5 sit to stands test by completing in 14 sec or less so she has increased strength and mobility for community services Baseline:  Goal status: INITIAL    LONG TERM GOALS: Target date: 12/24/24  Pt will be efficient with advanced HEP so she has increased independence for everyday activities.  Baseline:  Goal status: INITIAL  2.  Pt will be able to increase cervical flexion and extension ROM at least 10 deg or more so she has increased functional capacity for everyday ADL's (ex: reading,etc) Baseline: flexion 20  deg, ext 15 deg  Goal status: INITIAL  3.  Pt will improve NDI survey by scoring at least 10 % improvement so she can lift objects for ADL's safely Baseline: 62% (31/50) Goal status: INITIAL     PLAN:  PT FREQUENCY: 2x/week  PT DURATION: 8 weeks  PLANNED INTERVENTIONS: [id number]- PT Re-evaluation, 97750- Physical Performance Testing, 97110-Therapeutic exercises, 97530- Therapeutic activity, 97112- Neuromuscular re-education, 97535- Self Care, [id number]- Manual therapy, [id number]- Gait training, [phone]- Aquatic Therapy, [phone] (1-2 muscles), [id number] (3+ muscles)- Dry Needling, Patient/Family education, Balance training, Stair training, Joint mobilization, Spinal mobilization, Cryotherapy, and Moist heat  PLAN FOR NEXT SESSION: neck stretches as tolerated, thoracic mobility, deep neck flexor strengthening, median, ulnar, radial nerve mobilization, cervical flexion AROM, needling?   Ervin Hensley, PT 12/09/2024, 2:50 PM      "

## 2024-12-12 ENCOUNTER — Encounter: Payer: Self-pay | Admitting: Physical Therapy

## 2024-12-12 ENCOUNTER — Ambulatory Visit: Admitting: Physical Therapy

## 2024-12-12 DIAGNOSIS — M542 Cervicalgia: Secondary | ICD-10-CM

## 2024-12-12 NOTE — Therapy (Signed)
 " OUTPATIENT PHYSICAL THERAPY CERVICAL TREATMENT   Patient Name: Heather Thompson MRN: [id number] DOB:May 21, 1951, 74 y.o., female Today's Date: 12/12/2024  END OF SESSION:  PT End of Session - 12/12/24 1150     Visit Number 9    Number of Visits 17    Date for Recertification  12/24/24    Authorization Type Medicare Part A and B    Authorization - Visit Number 9    Progress Note Due on Visit 10    PT Start Time 1100    PT Stop Time 1153    PT Time Calculation (min) 53 min    Activity Tolerance Patient tolerated treatment well    Behavior During Therapy WFL for tasks assessed/performed                Past Medical History:  Diagnosis Date   Abnormality of gait 04/23/2019   Acquired hypothyroidism 07/29/2021   Allergic rhinitis due to animal hair and dander 12/02/2015   Formatting of this note might be different from the original.  [facility]  Seen by Dr. Leita 11/11/15     Plan- Received allergy shots today. Follow up 1 year.     Allergy    Anemia    Anemia    Anxiety    Arthritis of scaphoid-trapezium-trapezoid joint of right hand 09/10/2023   B12 deficiency    Back pain    Chronic fatigue 07/29/2021   Chronic fatigue syndrome    Chronic throat clearing 03/14/2023   Clotting disorder    Constipation    Depression with anxiety 04/07/2009   Formatting of this note might be different from the original.  well controlled on current meds, follows with psychiatrist in [place]. Had a   h/o severe depression 5 years ago, and had ECT.     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  B12 deficiency   Iron deficiency     Herniated lumbar intervertebral  disc 11/12/2020   High cholesterol    History of blood clots    History of cervical spinal surgery 01/01/2018   Formatting of this note might be different from the original.  Fusion C6 and C7 per 2018 MRI report.     History of left hip replacement 01/14/2018   History of Roux-en-Y gastric bypass    Hoarseness 03/14/2023   Hyperlipidemia 08/19/2010   Hypertension    Hyponatremia 01/17/2018   Hypothyroidism    Hypothyroidism    IBS (irritable bowel syndrome) 04/06/2021   Impaired fasting glucose 08/19/2010   Incontinence 10/30/2013   Formatting of this note might be different from the original. Formatting of this note might be different from the original. Urinary Incontinence     Infertility, female    Insomnia 04/07/2009   Formatting of this note might be different from the original.  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  Followed by [facility]  Seen  by Dr. Linnette 03/01/15     S/p changing thoracic spinal cord stimulator generator 08/18/14.     Lumbar spine films show spinal cord generator in left flank area. 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  Follows with Rheumatology in [facility] of this note might be different from the original.  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  Getting iron infusion via port at hematalogy/oncology center at [facility].   Was recently hospitalized in [facility] for severe anemia     Formatting of this note might be different from the original.  on OTC b12 currently     Vitamin D  deficiency    Past Surgical History:  Procedure Laterality Date   ABDOMINAL HYSTERECTOMY     APPENDECTOMY     CARPAL TUNNEL RELEASE Bilateral 2023   with thumb joint replacement   CERVICAL FUSION      CHOLECYSTECTOMY     FOOT SURGERY Bilateral    fusion   GASTRIC BYPASS     IR EMBO ARTERIAL NOT HEMORR HEMANG INC GUIDE ROADMAPPING  04/01/2024   IR RADIOLOGIST EVAL & MGMT  03/14/2024   IR RADIOLOGIST EVAL & MGMT  04/24/2024   SPINAL CORD STIMULATOR INSERTION  07/2023   TOTAL HIP ARTHROPLASTY Bilateral    11/2016 & 05/2017   Patient Active Problem List   Diagnosis Date Noted   Status post cervical spinal fusion 12/02/2024   Onychodystrophy 08/14/2024   Oral candidiasis 07/08/2024   Chronic bilateral hip pain after total replacement of both hip joints 06/18/2024   Left leg  pain 06/18/2024   Mononeuropathy 12/14/2023   Palpitations 10/04/2023   Cardiac murmur 10/04/2023   Allergy    Anemia    Anxiety    B12 deficiency    Back pain    Chronic fatigue syndrome    Clotting disorder    Constipation    Edema of both lower extremities    Fatty liver    Fibromyalgia    Gallbladder problem    High cholesterol    History of blood clots    History of Roux-en-Y gastric bypass    Hypothyroidism    Joint pain    Kidney stones    Muscle disorder    Osteoarthritis    Prediabetes    Salzmann nodular degeneration    SOBOE (shortness of breath on exertion)    Swallowing difficulty    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   Arthritis of scaphoid-trapezium-trapezoid joint of right hand 09/10/2023   Urge incontinence 08/28/2023   OAB (overactive bladder) 08/28/2023   Lumbar post-laminectomy syndrome 07/06/2023   Snapping hip syndrome, 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   Other myositis, left thigh 04/24/2023   Sacroiliitis, not elsewhere classified 04/24/2023   Dysphagia 04/17/2023   Elevated liver enzymes 04/02/2023   Elevated TSH 04/02/2023   Chronic throat clearing 03/14/2023   Globus sensation 03/14/2023   Hoarseness 03/14/2023   Muscle tension dysphonia 03/12/2023   Unspecified inflammatory spondylopathy, 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   Primary osteoarthritis of both knees 12/21/2021    Port-A-Cath in place 07/29/2021   Acquired hypothyroidism 07/29/2021   Chronic pain syndrome 07/29/2021   Vitamin B12 deficiency 07/29/2021   Vitamin D  deficiency 07/29/2021   Postmenopausal estrogen deficiency 07/29/2021   IBS (irritable bowel syndrome) 04/06/2021   Retention of urine 04/06/2021   Herniated lumbar intervertebral disc 11/12/2020   Skin sensation disturbance 11/12/2020   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   Spondylopathy, unspecified 11/12/2020   Trochanteric bursitis of both hips 11/19/2019   Low serum iron 04/23/2019   Osteomalacia 04/23/2019   Abnormality of gait 04/23/2019   History of bilateral hip arthroplasty 06/26/2018   Gastroesophageal reflux disease 06/18/2018   Hypertension 06/18/2018   Intertrigo 04/30/2018   Lentigines 04/30/2018   Hyponatremia 01/17/2018   History of cervical spinal surgery 01/01/2018   Obesity (BMI 30-39.9) 01/01/2018   SK (seborrheic keratosis) 04/18/2017   Pain management contract agreement 11/17/2016   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   Allergic rhinitis due to animal hair and dander 12/02/2015   Low back pain 03/03/2015   Incontinence 10/30/2013   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   Neutrophilic leukocytosis 08/12/2012   Thrombocytosis 08/12/2012   Osteoporosis, senile 11/15/2010   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   Hyperlipidemia 08/19/2010   Impaired fasting glucose 08/19/2010   Polyarthritis 09/28/2009   Degenerative disc disease 04/07/2009   Depression with anxiety 04/07/2009   Myopathy 04/07/2009   Insomnia 04/07/2009   Migraine 04/07/2009   Scoliosis 04/07/2009   Seasonal allergies 04/07/2009   Vitamin B12 deficiency anemia 04/07/2009    PCP: No PCP  REFERRING PROVIDER: Zada Palin, NP  REFERRING DIAG: M45 Ankylosing Spondylitis of cervical region M50.11 Cervical radiculopathy M79.7 Fibromyalgia  THERAPY DIAG:  Cervicalgia  Rationale for Evaluation and Treatment: Rehabilitation  ONSET DATE: November 10    SUBJECTIVE:  SUBJECTIVE STATEMENT: Pt states she is more sore on the Lt side today.  Hand dominance: Left  PERTINENT HISTORY:  See above  Spinal stimulator C1-C2 fusion   Pt reports she was supposed to have knee surgery in August, however the week before, she was diagnosed for having cellulitis in left leg, requiring hospitalization. Around the same time, pt also needed to have and was approved having C1-C2 fusion surgery on Nov 10, 25. She feels very tight on the back of the neck muscles, stating she cannot open her mouth to let food in all the way, and has difficulty chewing food. Reports staying 8 days in inpatient rehab after surgery. Staples removed 3 days ago. At this time her knee surgery is cancelled, and focus is on rehabilitating the neck.   PAIN:  Are you having pain? Yes: NPRS scale: 5/10 Pain location: back of neck , bil upper traps, upper anterior thoracic  Pain description: constant, throbbing, sharp, tight, electric  Aggravating factors: everything hurts Relieving factors: muscle relxer , pain meds   PRECAUTIONS: Other: Spinal stimulator    WEIGHT BEARING RESTRICTIONS: No  FALLS:  Has patient fallen in last 6 months? No  LIVING ENVIRONMENT: Lives with: lives alone Lives in: House/apartment Stairs: Yes: External: 4 steps; can reach both Has following equipment at home: None  OCCUPATION: Retired   PLOF: Independent  PATIENT GOALS: To move my neck more, less tightness, get the rocks out of my neck and head  NEXT MD VISIT: mid Jan 2026  OBJECTIVE:  Note: Objective measures were completed at Evaluation unless otherwise noted.  DIAGNOSTIC FINDINGS:  None on file  PATIENT SURVEYS:  NDI: 62% (31/50)  COGNITION: Overall cognitive status: Within functional  limits for tasks assessed  SENSATION: Not tested  POSTURE: rounded shoulders and forward head  PALPATION: Tender to palpate at bilateral upper traps Lt>Rt Tender:  -Bil lev scap Lt>Rt -suboccipitals - Bil Scalenes -Bil upper anterior thoracic region   CERVICAL ROM:   Active ROM A/PROM (deg) eval  Flexion 20 pain  Extension 15 pain  Right lateral flexion 10 pain  Left lateral flexion 30 pain  Right rotation 15 pain  Left rotation 11 pain   (Blank rows = not tested)  UPPER EXTREMITY ROM:  Active ROM Right eval Left eval  Shoulder flexion 130 145  Shoulder extension [facility] WFL  Shoulder abduction [facility]  Shoulder adduction    Shoulder extension    Shoulder internal rotation [facility] WFL  Shoulder external rotation [facility] WFL  Elbow flexion    Elbow extension    Wrist flexion    Wrist extension    Wrist ulnar deviation    Wrist radial deviation    Wrist pronation    Wrist supination     (Blank rows = not tested)  UPPER EXTREMITY MMT:  MMT Right eval Left eval  Shoulder flexion 4- 4  Shoulder extension    Shoulder abduction 4+ 4+  Shoulder adduction    Shoulder extension 4 4  Shoulder internal rotation 4+ 4+  Shoulder external rotation 4+ 4+  Middle trapezius    Lower trapezius    Elbow flexion 4 4  Elbow extension 4 4  Wrist flexion 4+ 4+  Wrist extension 4 4  Wrist ulnar deviation    Wrist radial deviation    Wrist pronation    Wrist supination    Grip strength     (Blank rows = not tested)   FUNCTIONAL TESTS:  5 times sit to stand:  19.32 sec   TREATMENT DATE:  [facility] Treatment:                                                DATE: 12/12/24  Manual Therapy: Cupping UT, Cervical paraspinals STM, TPR cervical paraspinals, bilat UT, suboccipitals Gentle ROM c spine as tolerated Neuromuscular re-ed: Supine horizontal abd yellow TB x 10 Supine bilat shoulder flexion holding yellow TB x 10 Head on coregeous ball:nods, turns Therapeutic  Activity: Cupping with head nods, head turns Seated thoracic rotation with physioball Scap squeeze x 10 Seated cat/camel  Modalities: Moist heat end of session   [facility] Adult PT Treatment:                                                DATE: 12/09/24  Manual Therapy: STM, TPR cervical paraspinals, bilat UT, suboccipitals Gentle ROM c spine as tolerated Neuromuscular re-ed: Supine nod, rotation Therapeutic Activity: Seated thoracic rotation with physioball Scap squeeze x 10 Seated cat/camel  Modalities: Moist heat end of session Self Care: Pt educated on dry needling as MD recommended this as a treatment option  [facility] Adult PT Treatment:                                                DATE: 12/05/24  Manual Therapy: STM, TPR cervical paraspinals, bilat UT, suboccipitals Gentle ROM c spine as tolerated Neuromuscular re-ed: Supine nod, rotation Supine horizontal abd yellow TB x 10 Supine bilat shoulder flexion holding yellow TB x 10 Therapeutic Activity: Seated thoracic rotation with physioball Scap squeeze x 10 Seated cat/camel  Modalities: Moist heat end of session   [facility] Adult PT Treatment:                                                DATE: 12/02/24  Manual Therapy: STM, TPR cervical paraspinals, bilat UT, suboccipitals Gentle ROM c spine as tolerated Neuromuscular re-ed: Head on coregeous ball: cervical rotation, cervical nods Cervical retraction as tolerated Therapeutic Activity: Posterior shoulder rolls sitting x 10 Scap squeeze sitting x 10 Seated thoracic rotation with physioball Seated cat/camel   PATIENT EDUCATION:  Education details: HEP and POC Person educated: Patient Education method: Explanation, Demonstration, Tactile cues, Verbal cues, and Handouts Education comprehension: verbalized understanding, returned demonstration, verbal cues required, tactile cues required, and needs further education  HOME EXERCISE PROGRAM: Access Code:  [facility] URL: [email or web address] Date: 12/02/2024 Prepared by: Darice Conine  Exercises - Seated Cervical Sidebending AROM  - 1 x daily - 7 x weekly - 3 sets - 10 reps - Seated Lumbar and Thoracic Forward Rotation with Anchored Resistance  - 1 x daily - 7 x weekly - 2 sets - 10 reps - Seated Cat Cow  - 1 x daily - 7 x weekly - 2 sets - 10 reps  ASSESSMENT:  CLINICAL IMPRESSION: Trial of cupping today with pt with good tolerance during session. Continued to work on postural strength  and cervical and thoracic mobility. Plan to update HEP next visit  OBJECTIVE IMPAIRMENTS: Abnormal gait, decreased activity tolerance, decreased balance, decreased coordination, decreased endurance, decreased mobility, decreased ROM, decreased strength, hypomobility, increased fascial restrictions, increased muscle spasms, impaired flexibility, impaired UE functional use, improper body mechanics, postural dysfunction, and pain.    GOALS: Goals reviewed with patient? Yes  SHORT TERM GOALS: Target date: 11/29/24  Pt will be efficient with initial HEP so she has increased independence for everyday activities.  Baseline:  Goal status: MET  2.  Pt will report no more than 6/10 neck pain so she has increased functional capacity for driving safely Baseline: 1/89 Goal status: MET 12/02/24   3.  Pt will improve 5 sit to stands test by completing in 14 sec or less so she has increased strength and mobility for community services Baseline:  Goal status: INITIAL    LONG TERM GOALS: Target date: 12/24/24  Pt will be efficient with advanced HEP so she has increased independence for everyday activities.  Baseline:  Goal status: INITIAL  2.  Pt will be able to increase cervical flexion and extension ROM at least 10 deg or more so she has increased functional capacity for everyday ADL's (ex: reading,etc) Baseline: flexion 20 deg, ext 15 deg  Goal status: INITIAL  3.  Pt will improve NDI survey  by scoring at least 10 % improvement so she can lift objects for ADL's safely Baseline: 62% (31/50) Goal status: INITIAL     PLAN:  PT FREQUENCY: 2x/week  PT DURATION: 8 weeks  PLANNED INTERVENTIONS: [id number]- PT Re-evaluation, 97750- Physical Performance Testing, 97110-Therapeutic exercises, 97530- Therapeutic activity, [id number]- Neuromuscular re-education, 97535- Self Care, [id number]- Manual therapy, [id number]- Gait training, [phone]- Aquatic Therapy, [phone] (1-2 muscles), [id number] (3+ muscles)- Dry Needling, Patient/Family education, Balance training, Stair training, Joint mobilization, Spinal mobilization, Cryotherapy, and Moist heat  PLAN FOR NEXT SESSION: how was cupping? Thoracic and cervical mobility, postural strength. UPDATE HEP   Kaizen Ibsen, PT 12/12/2024, 11:52 AM      "

## 2024-12-16 ENCOUNTER — Encounter: Payer: Self-pay | Admitting: Physical Therapy

## 2024-12-16 ENCOUNTER — Ambulatory Visit: Admitting: Physical Therapy

## 2024-12-16 DIAGNOSIS — M542 Cervicalgia: Secondary | ICD-10-CM | POA: Diagnosis not present

## 2024-12-16 NOTE — Therapy (Signed)
 " OUTPATIENT PHYSICAL THERAPY CERVICAL TREATMENT and 10th visit note   Patient Name: Heather Thompson MRN: [id number] DOB:1951/06/09, 74 y.o., female Today's Date: 12/16/2024  Dates of service: 10/29/24-12/16/24 END OF SESSION:  PT End of Session - 12/16/24 1441     Visit Number 10    Number of Visits 17    Date for Recertification  12/24/24    Authorization Type Medicare Part A and B    Authorization - Visit Number 10    Progress Note Due on Visit 20    PT Start Time 1400    PT Stop Time 1445    PT Time Calculation (min) 45 min    Activity Tolerance Patient tolerated treatment well    Behavior During Therapy [facility] for tasks assessed/performed                 Past Medical History:  Diagnosis Date   Abnormality of gait 04/23/2019   Acquired hypothyroidism 07/29/2021   Allergic rhinitis due to animal hair and dander 12/02/2015   Formatting of this note might be different from the original.  [facility]  Seen by Dr. Leita 11/11/15     Plan- Received allergy shots today. Follow up 1 year.     Allergy    Anemia    Anemia    Anxiety    Arthritis of scaphoid-trapezium-trapezoid joint of right hand 09/10/2023   B12 deficiency    Back pain    Chronic fatigue 07/29/2021   Chronic fatigue syndrome    Chronic throat clearing 03/14/2023   Clotting disorder    Constipation    Depression with anxiety 04/07/2009   Formatting of this note might be different from the original.  well controlled on current meds, follows with psychiatrist in [place]. Had a   h/o severe depression 5 years ago, and had ECT.     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  B12  deficiency   Iron deficiency     Herniated lumbar intervertebral disc 11/12/2020   High cholesterol    History of blood clots    History of cervical spinal surgery 01/01/2018   Formatting of this note might be different from the original.  Fusion C6 and C7 per 2018 MRI report.     History of left hip replacement 01/14/2018   History of Roux-en-Y gastric bypass    Hoarseness 03/14/2023   Hyperlipidemia 08/19/2010   Hypertension    Hyponatremia 01/17/2018   Hypothyroidism    Hypothyroidism    IBS (irritable bowel syndrome) 04/06/2021   Impaired fasting glucose 08/19/2010   Incontinence 10/30/2013   Formatting of this note might be different from the original. Formatting of this note might be different from the original. Urinary Incontinence     Infertility, female    Insomnia 04/07/2009   Formatting of this note might be different from the original.  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  Followed by [facility]  Seen by Dr. Linnette 03/01/15     S/p changing thoracic spinal cord stimulator generator 08/18/14.     Lumbar spine films show spinal cord generator in left flank area. 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  Follows with Rheumatology in [facility] of this note might be different from the original.  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  Getting iron infusion via port at hematalogy/oncology center at [facility].   Was recently hospitalized in [facility] for severe anemia     Formatting of this note might be different from the original.  on OTC b12 currently     Vitamin D  deficiency    Past Surgical History:  Procedure Laterality Date   ABDOMINAL HYSTERECTOMY     APPENDECTOMY     CARPAL TUNNEL RELEASE Bilateral 2023    with thumb joint replacement   CERVICAL FUSION     CHOLECYSTECTOMY     FOOT SURGERY Bilateral    fusion   GASTRIC BYPASS     IR EMBO ARTERIAL NOT HEMORR HEMANG INC GUIDE ROADMAPPING  04/01/2024   IR RADIOLOGIST EVAL & MGMT  03/14/2024   IR RADIOLOGIST EVAL & MGMT  04/24/2024   SPINAL CORD STIMULATOR INSERTION  07/2023   TOTAL HIP ARTHROPLASTY Bilateral    11/2016 & 05/2017   Patient Active Problem List   Diagnosis Date Noted   Status post cervical spinal fusion 12/02/2024   Onychodystrophy 08/14/2024   Oral candidiasis 07/08/2024   Chronic bilateral hip pain after total replacement  of both hip joints 06/18/2024   Left leg pain 06/18/2024   Mononeuropathy 12/14/2023   Palpitations 10/04/2023   Cardiac murmur 10/04/2023   Allergy    Anemia    Anxiety    B12 deficiency    Back pain    Chronic fatigue syndrome    Clotting disorder    Constipation    Edema of both lower extremities    Fatty liver    Fibromyalgia    Gallbladder problem    High cholesterol    History of blood clots    History of Roux-en-Y gastric bypass    Hypothyroidism    Joint pain    Kidney stones    Muscle disorder    Osteoarthritis    Prediabetes    Salzmann nodular degeneration    SOBOE (shortness of breath on exertion)    Swallowing difficulty    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   Arthritis of scaphoid-trapezium-trapezoid joint of right hand 09/10/2023   Urge incontinence 08/28/2023   OAB (overactive bladder) 08/28/2023   Lumbar post-laminectomy syndrome 07/06/2023   Snapping hip syndrome, 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   Other myositis, left thigh 04/24/2023   Sacroiliitis, not elsewhere classified 04/24/2023   Dysphagia 04/17/2023   Elevated liver enzymes 04/02/2023   Elevated TSH 04/02/2023   Chronic throat clearing 03/14/2023   Globus sensation 03/14/2023   Hoarseness 03/14/2023   Muscle tension dysphonia 03/12/2023   Unspecified inflammatory spondylopathy, 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    Primary osteoarthritis of both knees 12/21/2021   Port-A-Cath in place 07/29/2021   Acquired hypothyroidism 07/29/2021   Chronic pain syndrome 07/29/2021   Vitamin B12 deficiency 07/29/2021   Vitamin D  deficiency 07/29/2021   Postmenopausal estrogen deficiency 07/29/2021   IBS (irritable bowel syndrome) 04/06/2021   Retention of urine 04/06/2021   Herniated lumbar intervertebral disc 11/12/2020   Skin sensation disturbance 11/12/2020   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   Spondylopathy, unspecified 11/12/2020   Trochanteric bursitis of both hips 11/19/2019   Low serum iron 04/23/2019   Osteomalacia 04/23/2019   Abnormality of gait 04/23/2019   History of bilateral hip arthroplasty 06/26/2018   Gastroesophageal reflux disease 06/18/2018   Hypertension 06/18/2018   Intertrigo 04/30/2018   Lentigines 04/30/2018   Hyponatremia 01/17/2018   History of cervical spinal surgery 01/01/2018   Obesity (BMI 30-39.9) 01/01/2018   SK (seborrheic keratosis) 04/18/2017   Pain management contract agreement 11/17/2016   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   Allergic rhinitis due to animal hair and dander 12/02/2015   Low back pain 03/03/2015   Incontinence 10/30/2013   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   Neutrophilic leukocytosis 08/12/2012   Thrombocytosis 08/12/2012   Osteoporosis, senile 11/15/2010   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   Hyperlipidemia 08/19/2010   Impaired fasting glucose 08/19/2010   Polyarthritis 09/28/2009   Degenerative disc disease 04/07/2009   Depression with anxiety 04/07/2009   Myopathy 04/07/2009   Insomnia 04/07/2009   Migraine 04/07/2009   Scoliosis 04/07/2009   Seasonal allergies 04/07/2009   Vitamin B12 deficiency anemia 04/07/2009    PCP: No PCP  REFERRING PROVIDER: Zada Palin, NP  REFERRING DIAG: M45 Ankylosing Spondylitis of cervical region M50.11 Cervical radiculopathy M79.7 Fibromyalgia  THERAPY DIAG:  Cervicalgia  Rationale for Evaluation and  Treatment: Rehabilitation  ONSET DATE: November 10   SUBJECTIVE:  SUBJECTIVE STATEMENT: Pt states the soreness is spreading to her anterior neck  Hand dominance: Left  PERTINENT HISTORY:  See above  Spinal stimulator C1-C2 fusion   Pt reports she was supposed to have knee surgery in August, however the week before, she was diagnosed for having cellulitis in left leg, requiring hospitalization. Around the same time, pt also needed to have and was approved having C1-C2 fusion surgery on Nov 10, 25. She feels very tight on the back of the neck muscles, stating she cannot open her mouth to let food in all the way, and has difficulty chewing food. Reports staying 8 days in inpatient rehab after surgery. Staples removed 3 days ago. At this time her knee surgery is cancelled, and focus is on rehabilitating the neck.   PAIN:  Are you having pain? Yes: NPRS scale: 5/10 Pain location: back of neck , bil upper traps, upper anterior thoracic  Pain description: constant, throbbing, sharp, tight, electric  Aggravating factors: everything hurts Relieving factors: muscle relxer , pain meds   PRECAUTIONS: Other: Spinal stimulator    WEIGHT BEARING RESTRICTIONS: No  FALLS:  Has patient fallen in last 6 months? No  LIVING ENVIRONMENT: Lives with: lives alone Lives in: House/apartment Stairs: Yes: External: 4 steps; can reach both Has following equipment at home: None  OCCUPATION: Retired   PLOF: Independent  PATIENT GOALS: To move my neck more, less tightness, get the rocks out of my neck and head  NEXT MD VISIT: mid Jan 2026  OBJECTIVE:  Note: Objective measures were completed at Evaluation unless otherwise noted.  DIAGNOSTIC FINDINGS:  None on file  PATIENT SURVEYS:  NDI: 62%  (31/50)  COGNITION: Overall cognitive status: Within functional limits for tasks assessed  SENSATION: Not tested  POSTURE: rounded shoulders and forward head  PALPATION: Tender to palpate at bilateral upper traps Lt>Rt Tender:  -Bil lev scap Lt>Rt -suboccipitals - Bil Scalenes -Bil upper anterior thoracic region   CERVICAL ROM:   Active ROM A/PROM (deg) eval  Flexion 20 pain  Extension 15 pain  Right lateral flexion 10 pain  Left lateral flexion 30 pain  Right rotation 15 pain  Left rotation 11 pain   (Blank rows = not tested)  UPPER EXTREMITY ROM:  Active ROM Right eval Left eval  Shoulder flexion 130 145  Shoulder extension [facility] WFL  Shoulder abduction [facility]  Shoulder adduction    Shoulder extension    Shoulder internal rotation [facility] WFL  Shoulder external rotation [facility] WFL  Elbow flexion    Elbow extension    Wrist flexion    Wrist extension    Wrist ulnar deviation    Wrist radial deviation    Wrist pronation    Wrist supination     (Blank rows = not tested)  UPPER EXTREMITY MMT:  MMT Right eval Left eval  Shoulder flexion 4- 4  Shoulder extension    Shoulder abduction 4+ 4+  Shoulder adduction    Shoulder extension 4 4  Shoulder internal rotation 4+ 4+  Shoulder external rotation 4+ 4+  Middle trapezius    Lower trapezius    Elbow flexion 4 4  Elbow extension 4 4  Wrist flexion 4+ 4+  Wrist extension 4 4  Wrist ulnar deviation    Wrist radial deviation    Wrist pronation    Wrist supination    Grip strength     (Blank rows = not tested)   FUNCTIONAL TESTS:  5 times sit to stand: 19.32  sec   TREATMENT DATE: [facility] Adult PT Treatment:                                                DATE: 12/16/24 Therapeutic Exercise: HEP update and review Manual Therapy: STM, TPR cervical paraspinals, bilat UT, suboccipitals, scalenes, pecs Gentle ROM c spine as tolerated Neuromuscular re-ed: Head on coregeous ball:nods, turns Therapeutic  Activity: Doorway stretch 2 x 30 sec mid and high Standing thoracic rotation at wall Scap squeeze x 10  Modalities: Moist heat end of session   [facility] Adult PT Treatment:                                                DATE: 12/12/24  Manual Therapy: Cupping UT, Cervical paraspinals STM, TPR cervical paraspinals, bilat UT, suboccipitals Gentle ROM c spine as tolerated Neuromuscular re-ed: Supine horizontal abd yellow TB x 10 Supine bilat shoulder flexion holding yellow TB x 10 Head on coregeous ball:nods, turns Therapeutic Activity: Cupping with head nods, head turns Seated thoracic rotation with physioball Scap squeeze x 10 Seated cat/camel  Modalities: Moist heat end of session   [facility] Adult PT Treatment:                                                DATE: 12/09/24  Manual Therapy: STM, TPR cervical paraspinals, bilat UT, suboccipitals Gentle ROM c spine as tolerated Neuromuscular re-ed: Supine nod, rotation Therapeutic Activity: Seated thoracic rotation with physioball Scap squeeze x 10 Seated cat/camel  Modalities: Moist heat end of session Self Care: Pt educated on dry needling as MD recommended this as a treatment option  [facility] Adult PT Treatment:                                                DATE: 12/05/24  Manual Therapy: STM, TPR cervical paraspinals, bilat UT, suboccipitals Gentle ROM c spine as tolerated Neuromuscular re-ed: Supine nod, rotation Supine horizontal abd yellow TB x 10 Supine bilat shoulder flexion holding yellow TB x 10 Therapeutic Activity: Seated thoracic rotation with physioball Scap squeeze x 10 Seated cat/camel  Modalities: Moist heat end of session   [facility] Adult PT Treatment:                                                DATE: 12/02/24  Manual Therapy: STM, TPR cervical paraspinals, bilat UT, suboccipitals Gentle ROM c spine as tolerated Neuromuscular re-ed: Head on coregeous ball: cervical rotation, cervical nods Cervical  retraction as tolerated Therapeutic Activity: Posterior shoulder rolls sitting x 10 Scap squeeze sitting x 10 Seated thoracic rotation with physioball Seated cat/camel   PATIENT EDUCATION:  Education details: HEP and POC Person educated: Patient Education method: Explanation, Demonstration, Tactile cues, Verbal cues, and Handouts Education comprehension: verbalized understanding, returned demonstration, verbal cues required,  tactile cues required, and needs further education  HOME EXERCISE PROGRAM: Access Code: [facility] URL: [email or web address] Date: 12/16/2024 Prepared by: Darice Conine  Exercises - Seated Cervical Sidebending AROM  - 1 x daily - 7 x weekly - 3 sets - 10 reps - Seated Lumbar and Thoracic Forward Rotation with Anchored Resistance  - 1 x daily - 7 x weekly - 2 sets - 10 reps - Seated Cat Cow  - 1 x daily - 7 x weekly - 2 sets - 10 reps - Standing with Forearms Thoracic Rotation  - 1 x daily - 7 x weekly - 1 sets - 10 reps - 5 seconds hold - Doorway Pec Stretch at 90 Degrees Abduction  - 1 x daily - 7 x weekly - 1 sets - 3 reps - 30 seconds hold  ASSESSMENT:  CLINICAL IMPRESSION: Pt with more tightness in anterior neck. Good response to manual work and addition of doorway stretch  OBJECTIVE IMPAIRMENTS: Abnormal gait, decreased activity tolerance, decreased balance, decreased coordination, decreased endurance, decreased mobility, decreased ROM, decreased strength, hypomobility, increased fascial restrictions, increased muscle spasms, impaired flexibility, impaired UE functional use, improper body mechanics, postural dysfunction, and pain.    GOALS: Goals reviewed with patient? Yes  SHORT TERM GOALS: Target date: 11/29/24  Pt will be efficient with initial HEP so she has increased independence for everyday activities.  Baseline:  Goal status: MET  2.  Pt will report no more than 6/10 neck pain so she has increased functional capacity for  driving safely Baseline: 1/89 Goal status: MET 12/02/24   3.  Pt will improve 5 sit to stands test by completing in 14 sec or less so she has increased strength and mobility for community services Baseline:  Goal status: INITIAL    LONG TERM GOALS: Target date: 12/24/24  Pt will be efficient with advanced HEP so she has increased independence for everyday activities.  Baseline:  Goal status: INITIAL  2.  Pt will be able to increase cervical flexion and extension ROM at least 10 deg or more so she has increased functional capacity for everyday ADL's (ex: reading,etc) Baseline: flexion 20 deg, ext 15 deg  Goal status: INITIAL  3.  Pt will improve NDI survey by scoring at least 10 % improvement so she can lift objects for ADL's safely Baseline: 62% (31/50) Goal status: INITIAL     PLAN:  PT FREQUENCY: 2x/week  PT DURATION: 8 weeks  PLANNED INTERVENTIONS: [id number]- PT Re-evaluation, 97750- Physical Performance Testing, 97110-Therapeutic exercises, 97530- Therapeutic activity, [id number]- Neuromuscular re-education, 97535- Self Care, [id number]- Manual therapy, [id number]- Gait training, [phone]- Aquatic Therapy, [phone] (1-2 muscles), [id number] (3+ muscles)- Dry Needling, Patient/Family education, Balance training, Stair training, Joint mobilization, Spinal mobilization, Cryotherapy, and Moist heat  PLAN FOR NEXT SESSION: Thoracic and cervical mobility, postural strength.    Hao Dion, PT 12/16/2024, 2:43 PM      "

## 2024-12-18 ENCOUNTER — Ambulatory Visit: Admitting: Professional

## 2024-12-18 ENCOUNTER — Encounter: Payer: Self-pay | Admitting: Professional

## 2024-12-18 DIAGNOSIS — F331 Major depressive disorder, recurrent, moderate: Secondary | ICD-10-CM

## 2024-12-18 DIAGNOSIS — G4700 Insomnia, unspecified: Secondary | ICD-10-CM

## 2024-12-18 DIAGNOSIS — F411 Generalized anxiety disorder: Secondary | ICD-10-CM | POA: Diagnosis not present

## 2024-12-18 DIAGNOSIS — F4321 Adjustment disorder with depressed mood: Secondary | ICD-10-CM | POA: Diagnosis not present

## 2024-12-18 NOTE — Progress Notes (Signed)
 "  [facility] Counselor/Therapist Progress Note  Patient ID: Heather Thompson, MRN: [id number],    Date: 12/18/2024  Time Spent:  48 minutes 1057-1145am  Treatment Type: Individual Therapy  Risk Assessment: Danger to Self:  No Self-injurious Behavior: No Danger to Others: No  Subjective: The patient arrived on time for her in person session.  Issues addressed: 1-physical a-neck -improving -continues in PT 2-mood -pt continues to feel depressed and admits this occurs every winter -she is not as negative as in the past despite struggling with feeling depressed 3-family -she has had contact with her daughter and son and have positive interactions -pt will be traveling to [place] in the near future to attend a wedding reception for her grandson who lives in [place] -pt did discuss with children her limited finances and significant costs associated with coming to visit -she did not directly ask her children to help her pay for her tickets moving forward -her DIL did find more affordable flights for her that saved her wallet and she was thankful for her help 4-feeling isolated -pt admits that the winter months are hard because it is so cold and the days are short and she will not drive at night -pt's friends only go to lunch and never go to one another's homes to socialize -pt admits she would like to have more opportunities for socialization -talked with pt about her planning an afternoon lunch and game time and inviting people and she said she would enjoy -also discussed her offering to host the ladies at the pub that is associated with [place] and she liked that idea as well -pt cites that she can walk to the pub and not even have to drive  Treatment Plan Problems: Financial Stress, Low Self-Esteem, Attention Deficit Disorder (ADD) - Adult Symptoms: Unable to concentrate or pay attention to things of low interest, even when those things are important to  his/her life. Restless and fidgety; unable to be sedentary for more than a short time. Disorganized in most areas of his/her life. Starts many projects but rarely finishes any. Chronic low self-esteem. Tendency toward addictive behaviors. Indebtedness and overdue bills that exceed ability to meet monthly payments. A long-term lack of discipline in money management that has led to excessive indebtedness. A pattern of impulsive spending that does not consider the eventual financial consequences. Inability to accept compliments. Makes self-disparaging remarks; sees self as unattractive, worthless, a loser, a burden, unimportant; takes blame easily. Difficulty in saying no to others; assumes not being liked by others. Fear of rejection by others, especially peer group. Lack of any goals for life and setting of inappropriately low goals for self. Inability to identify positive characteristics of self. Anxious and uncomfortable in social situations. Goals: Reduce impulsive actions while increasing concentration and focus on low-interest activities. Minimize ADD behavioral interference in daily life. Sustain attention and concentration for consistently longer periods of time. Achieve an inner strength to control personal impulses, cravings, and desires that directly or indirectly increase debt irresponsibly. Resolve financial crisis with a path to eliminate debt. Revise spending patterns to not exceed income. Gain a new sense of self-worth in which the substance of one's value is not attached to the capacity to do things or own things that cost money. Understand personal desires, insecurities, and anxieties that make overspending possible. Elevate self-esteem. Develop a consistent, positive self-image. Demonstrate improved self-esteem through more pride in appearance, more assertiveness, greater eye contact, and identification of positive traits  in self-talk messages. Establish an inward sense  of self-worth, confidence, and competence. Interact socially without undue distress or disability. Objectives target date for all objectives is 03/25/2025: Identify the current specific ADD behaviors that cause the most difficulty. List the negative consequences of the ADD problematic behavior. Learn and implement organization and planning skills. Acknowledge procrastination and the need to reduce it. Learn and implement skills to reduce procrastination. Learn and implement skills to reduce the disruptive influence of distractibility. Combine skills learned in therapy into a new daily approach to managing ADHD. Use cognitive and behavioral strategies to control the impulse to make unnecessary and unaffordable purchases. Report instances of successful control over impulse to spend on unnecessary expenses. Increase insight into the historical and current sources of low self-esteem. Decrease the frequency of negative self-descriptive statements and increase frequency of positive self-descriptive statements. Articulate a plan to be proactive in trying to get identified needs met. Form realistic, appropriate, and attainable goals for self in all areas of life. Take verbal responsibility for accomplishments without discounting. Identify and replace negative self-talk messages used to reinforce low self-esteem. Objectives target date for all objectives is 03/13/2025: Identify the specific anxiety symptoms that are personally most disturbing or most contributing to impaired functioning. Learn and practice thought and behavioral control methods to minimize and control anxiety symptoms once they have begun. Identify specific thoughts that precipitate anxiety symptoms. Replace anxiety-producing thoughts with constructive thoughts. Verbalize an understanding of the general physical and cognitive manifestations of the causes for anxiety. Identify and clarify the patterns of anxiety precipitants and  consequences. Identify whether the symptoms of depression seem to be primarily related to interpersonal relationships, stressful life events or circumstances, thoughts/beliefs, or behaviors. Verbalize an understanding of the general physical and psychological effects of depression. Replace depression-promoting thoughts with mood-elevating thoughts. Keep a daily record of mood rating from 1 to 10, noting associated behaviors, activities, events, people, and thoughts. Identify specific thoughts that precipitate depressive moods. Monitor and report depressive symptoms, completing self-report assessment on a periodic basis. Describe and evaluate significant past and current interpersonal relationships. Tell memories of the deceased person, starting with positive memories. Consider possible ways of increasing involvement with others. Accept the reality of loss and tolerate the pain of intense grief. Engage in events and activities that increase level of social involvements. Identify and monitor specific pain triggers. Learn and implement somatic skills such as relaxation and/or biofeedback to reduce pain level. Learn mental coping skills and implement with somatic skills for managing acute pain. Interventions: Reinforce the client's belief system and/or refer to a spiritual leader who can support the client's faith. Explore and problem-solve with the client difficulties, fears, or barriers related to increasing social involvement. Point out to the client that engagement with others is associated with decreased feelings of loneliness, grief, depression, and hopelessness. Provide encouragement and support to the client for increased engagement in contact with others. Clarify with the client that expression of negative thoughts or feelings about the deceased is not disloyal. Assist the client in remembering the loved one in realistic ways (i.e., includes attractions, positive traits, negative traits,  conflicts, ambivalence, dependency, etc.), a memory that is neither idealized nor demonized, so that the client's relationship with the deceased person can be appraised realistically (or assign Dear _____: A Letter to a Lost Loved One in the Adult Psychotherapy Homework Planner, 2nd ed. by Jenniffer). Assign the client to read about progressive muscle relaxation and other calming strategies in relevant books or treatment manuals (  e.g., Progressive Relaxation Training by Thornell armin Collier). Assign a homework exercise in which the client implements somatic pain management skills and records the result; review and process during the treatment session. Teach the client distraction techniques (e.g., pleasant imagery, counting techniques, alternative focal points) and how to use them for relaxation skills for the management of acute episodes of pain. Ask the client to create a list of activities that are pleasurable to him/her (or assign Identify and Schedule Pleasant Activities in the Adult Psychotherapy Homework Planner, 2nd ed. by Jenniffer); process the list, developing a plan of increasing the frequency of engaging in the selected pleasurable activities. Teach the client problem-solving skills to apply to removal of obstacles to implementing new skills; role-play implementation of these skills to obstacles. Assist the client in identifying the most effective personal stress management techniques (e.g., prayer, walking, baking, telephoning a friend), and encourage daily scheduling of these activities (or assign Past Successful Anxiety Coping in the Adult Psychotherapy Homework Planner, 2nd ed. by Jenniffer). Teach the client the concept of finding a good match between the individual's capacities and the demands of the physical environment. If the physical environment is too demanding for the individual's capacities (e.g., frail older adult taking care of a large house), the individual can become  overwhelmed and may need to move from large home to smaller accommodations or residence for older adults). While reviewing the client's anxiety symptom chart, help him/her recognize patterns associated with anxiety symptoms: sort out precipitants from consequences, identify the most intense or frequent precipitants, and identify the consequences that help to perpetuate maladaptive patterns. Assist the client in identifying his/her current attempts to reducing anxiety (e.g., constantly telephoning family or physician, making unneeded doctor appointments or going to the emergency room), their apparent positive consequences (e.g., feeling better, getting attention), but longer-term negative consequences (e.g., physician won't return calls, friends avoid the client because of expressions of worry). Teach the client that his/her mood can be improved by increasing pleasant events and decreasing unpleasant events. Encourage the client to identify pleasant events that are desirable, but are not currently a part of his/her daily routine (see Identify and Schedule Pleasant Activities in the Adult Psychotherapy Homework Planner, 2nd ed. by Jenniffer). Develop a one-week daily schedule with the client that increases pleasant events and decreases unpleasant events, making sure to have at least one pleasant event every day. Help the client to understand his/her strengths and weaknesses in problem-solving, to view problems as challenges and not personal deficits, and to identify and define current problem(s) that will be the focus of treatment. Help the client to identify realistic goals to address problem(s) of concern and identify major obstacles in achieving goals. Encourage the client to implement chosen solutions to current life problem(s), self-monitor and evaluate solution implementation, and reward self for efforts. Explore the relationship of negative self-talk (e.g., I'm just a burden to everyone; Everyone  would be better off if I were dead) and distorted beliefs (e.g., There's no one like me living here; I'd rather never leave my room than have anyone see me in a wheelchair) to depressed mood (or assign Negative Thoughts Trigger Negative Feelings in the Adult Psychotherapy Homework Planner, 2nd ed. by Jenniffer; or Daily Record of Dysfunctional Thoughts in Cognitive Therapy of Depression by Almarie Candida Gentry and Shona). Encourage the client to distinguish between all-or-nothing thinking (e.g., Life in a nursing home can't be worth living) and genuine nuanced reflection on life's meaning and purpose (e.g., What's giving my life meaning  at this stage?); confront the former, encourage the latter.  Diagnosis:Major depressive disorder, recurrent episode, moderate (HCC)  Generalized anxiety disorder  Grief  Insomnia, unspecified type  Plan:  -meet again on Thursday, January 01, 2025 at 11am in person  "

## 2024-12-19 ENCOUNTER — Ambulatory Visit: Admitting: Physical Therapy

## 2024-12-19 ENCOUNTER — Encounter: Payer: Self-pay | Admitting: Physical Therapy

## 2024-12-19 DIAGNOSIS — M542 Cervicalgia: Secondary | ICD-10-CM

## 2024-12-19 NOTE — Therapy (Signed)
 " OUTPATIENT PHYSICAL THERAPY CERVICAL TREATMENT   Patient Name: Heather Thompson MRN: [id number] DOB:[birth year], 74 y.o., female Today's Date: 12/19/2024  Dates of service: 10/29/24-12/16/24 END OF SESSION:  PT End of Session - 12/19/24 1140     Visit Number 11    Number of Visits 17    Date for Recertification  12/24/24    Authorization Type Medicare Part A and B    Authorization - Visit Number 11    Progress Note Due on Visit 20    PT Start Time 1100    PT Stop Time 1145    PT Time Calculation (min) 45 min    Activity Tolerance Patient tolerated treatment well    Behavior During Therapy WFL for tasks assessed/performed                  Past Medical History:  Diagnosis Date   Abnormality of gait 04/23/2019   Acquired hypothyroidism 07/29/2021   Allergic rhinitis due to animal hair and dander 12/02/2015   Formatting of this note might be different from the original.  [facility]  Seen by Dr. Leita 11/11/15     Plan- Received allergy shots today. Follow up 1 year.     Allergy    Anemia    Anemia    Anxiety    Arthritis of scaphoid-trapezium-trapezoid joint of right hand 09/10/2023   B12 deficiency    Back pain    Chronic fatigue 07/29/2021   Chronic fatigue syndrome    Chronic throat clearing 03/14/2023   Clotting disorder    Constipation    Depression with anxiety 04/07/2009   Formatting of this note might be different from the original.  well controlled on current meds, follows with psychiatrist in [place]. Had a   h/o severe depression 5 years ago, and had ECT.     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  B12 deficiency   Iron  deficiency     Herniated lumbar intervertebral disc 11/12/2020   High cholesterol    History of blood clots    History of cervical spinal surgery 01/01/2018   Formatting of this note might be different from the original.  Fusion C6 and C7 per 2018 MRI report.     History of left hip replacement 01/14/2018   History of Roux-en-Y gastric bypass    Hoarseness 03/14/2023   Hyperlipidemia 08/19/2010   Hypertension    Hyponatremia 01/17/2018   Hypothyroidism    Hypothyroidism    IBS (irritable bowel syndrome) 04/06/2021   Impaired fasting glucose 08/19/2010   Incontinence 10/30/2013   Formatting of this note might be different from the original. Formatting of this note might be different from the original. Urinary Incontinence     Infertility, female    Insomnia 04/07/2009   Formatting of this note might be different from the original.  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  [facility]  Seen by Dr. Linnette 03/01/15     S/p changing thoracic spinal cord stimulator generator 08/18/14.     Lumbar spine films show spinal cord generator in left flank area. 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  Follows with Rheumatology in [facility] of this note might be different from the original.  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  Getting iron infusion via port at hematalogy/oncology center at [facility]   Was recently hospitalized in [facility] for severe anemia     Formatting of this note might be different from the original.  on OTC b12 currently     Vitamin D  deficiency    Past Surgical History:  Procedure Laterality Date   ABDOMINAL HYSTERECTOMY     APPENDECTOMY     CARPAL TUNNEL RELEASE Bilateral 2023   with thumb  joint replacement   CERVICAL FUSION     CHOLECYSTECTOMY     FOOT SURGERY Bilateral    fusion   GASTRIC BYPASS     IR EMBO ARTERIAL NOT HEMORR HEMANG INC GUIDE ROADMAPPING  04/01/2024   IR RADIOLOGIST EVAL & MGMT  03/14/2024   IR RADIOLOGIST EVAL & MGMT  04/24/2024   SPINAL CORD STIMULATOR INSERTION  07/2023   TOTAL HIP ARTHROPLASTY Bilateral    11/2016 & 05/2017   Patient Active Problem List   Diagnosis Date Noted   Status post cervical spinal fusion 12/02/2024   Onychodystrophy 08/14/2024   Oral candidiasis 07/08/2024   Chronic bilateral hip pain after total replacement of both hip  joints 06/18/2024   Left leg pain 06/18/2024   Mononeuropathy 12/14/2023   Palpitations 10/04/2023   Cardiac murmur 10/04/2023   Allergy    Anemia    Anxiety    B12 deficiency    Back pain    Chronic fatigue syndrome    Clotting disorder    Constipation    Edema of both lower extremities    Fatty liver    Fibromyalgia    Gallbladder problem    High cholesterol    History of blood clots    History of Roux-en-Y gastric bypass    Hypothyroidism    Joint pain    Kidney stones    Muscle disorder    Osteoarthritis    Prediabetes    Salzmann nodular degeneration    SOBOE (shortness of breath on exertion)    Swallowing difficulty    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   Arthritis of scaphoid-trapezium-trapezoid joint of right hand 09/10/2023   Urge incontinence 08/28/2023   OAB (overactive bladder) 08/28/2023   Lumbar post-laminectomy syndrome 07/06/2023   Snapping hip syndrome, 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   Other myositis, left thigh 04/24/2023   Sacroiliitis, not elsewhere classified 04/24/2023   Dysphagia 04/17/2023   Elevated liver enzymes 04/02/2023   Elevated TSH 04/02/2023   Chronic throat clearing 03/14/2023   Globus sensation 03/14/2023   Hoarseness 03/14/2023   Muscle tension dysphonia 03/12/2023   Unspecified inflammatory spondylopathy, 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   Primary  osteoarthritis of both knees 12/21/2021   Port-A-Cath in place 07/29/2021   Acquired hypothyroidism 07/29/2021   Chronic pain syndrome 07/29/2021   Vitamin B12 deficiency 07/29/2021   Vitamin D  deficiency 07/29/2021   Postmenopausal estrogen deficiency 07/29/2021   IBS (irritable bowel syndrome) 04/06/2021   Retention of urine 04/06/2021   Herniated lumbar intervertebral disc 11/12/2020   Skin sensation disturbance 11/12/2020   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   Spondylopathy, unspecified 11/12/2020   Trochanteric bursitis of both hips 11/19/2019   Low serum iron 04/23/2019   Osteomalacia 04/23/2019   Abnormality of gait 04/23/2019   History of bilateral hip arthroplasty 06/26/2018   Gastroesophageal reflux disease 06/18/2018   Hypertension 06/18/2018   Intertrigo 04/30/2018   Lentigines 04/30/2018   Hyponatremia 01/17/2018   History of cervical spinal surgery 01/01/2018   Obesity (BMI 30-39.9) 01/01/2018   SK (seborrheic keratosis) 04/18/2017   Pain management contract agreement 11/17/2016   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   Allergic rhinitis due to animal hair and dander 12/02/2015   Low back pain 03/03/2015   Incontinence 10/30/2013   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   Neutrophilic leukocytosis 08/12/2012   Thrombocytosis 08/12/2012   Osteoporosis, senile 11/15/2010   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   Hyperlipidemia 08/19/2010   Impaired fasting glucose 08/19/2010   Polyarthritis 09/28/2009   Degenerative disc disease 04/07/2009   Depression with anxiety 04/07/2009   Myopathy 04/07/2009   Insomnia 04/07/2009   Migraine 04/07/2009   Scoliosis 04/07/2009   Seasonal allergies 04/07/2009   Vitamin B12 deficiency anemia 04/07/2009    PCP: No PCP  REFERRING PROVIDER: Zada Palin, NP  REFERRING DIAG: M45 Ankylosing Spondylitis of cervical region M50.11 Cervical radiculopathy M79.7 Fibromyalgia  THERAPY DIAG:  Cervicalgia  Rationale for Evaluation and Treatment:  Rehabilitation  ONSET DATE: November 10   SUBJECTIVE:  SUBJECTIVE STATEMENT: Nothing new, just tight  Hand dominance: Left  PERTINENT HISTORY:  See above  Spinal stimulator C1-C2 fusion   Pt reports she was supposed to have knee surgery in August, however the week before, she was diagnosed for having cellulitis in left leg, requiring hospitalization. Around the same time, pt also needed to have and was approved having C1-C2 fusion surgery on Nov 10, 25. She feels very tight on the back of the neck muscles, stating she cannot open her mouth to let food in all the way, and has difficulty chewing food. Reports staying 8 days in inpatient rehab after surgery. Staples removed 3 days ago. At this time her knee surgery is cancelled, and focus is on rehabilitating the neck.   PAIN:  Are you having pain? Yes: NPRS scale: 5/10 Pain location: back of neck , bil upper traps, upper anterior thoracic  Pain description: constant, throbbing, sharp, tight, electric  Aggravating factors: everything hurts Relieving factors: muscle relxer , pain meds   PRECAUTIONS: Other: Spinal stimulator    WEIGHT BEARING RESTRICTIONS: No  FALLS:  Has patient fallen in last 6 months? No  LIVING ENVIRONMENT: Lives with: lives alone Lives in: House/apartment Stairs: Yes: External: 4 steps; can reach both Has following equipment at home: None  OCCUPATION: Retired   PLOF: Independent  PATIENT GOALS: To move my neck more, less tightness, get the rocks out of my neck and head  NEXT MD VISIT: mid Jan 2026  OBJECTIVE:  Note: Objective measures were completed at Evaluation unless otherwise noted.  DIAGNOSTIC FINDINGS:  None on file  PATIENT SURVEYS:  NDI: 62% (31/50)  COGNITION: Overall cognitive status:  Within functional limits for tasks assessed  SENSATION: Not tested  POSTURE: rounded shoulders and forward head  PALPATION: Tender to palpate at bilateral upper traps Lt>Rt Tender:  -Bil lev scap Lt>Rt -suboccipitals - Bil Scalenes -Bil upper anterior thoracic region   CERVICAL ROM:   Active ROM A/PROM (deg) eval  Flexion 20 pain  Extension 15 pain  Right lateral flexion 10 pain  Left lateral flexion 30 pain  Right rotation 15 pain  Left rotation 11 pain   (Blank rows = not tested)  UPPER EXTREMITY ROM:  Active ROM Right eval Left eval  Shoulder flexion 130 145  Shoulder extension [facility] WFL  Shoulder abduction [facility]  Shoulder adduction    Shoulder extension    Shoulder internal rotation [facility] WFL  Shoulder external rotation [facility] WFL  Elbow flexion    Elbow extension    Wrist flexion    Wrist extension    Wrist ulnar deviation    Wrist radial deviation    Wrist pronation    Wrist supination     (Blank rows = not tested)  UPPER EXTREMITY MMT:  MMT Right eval Left eval  Shoulder flexion 4- 4  Shoulder extension    Shoulder abduction 4+ 4+  Shoulder adduction    Shoulder extension 4 4  Shoulder internal rotation 4+ 4+  Shoulder external rotation 4+ 4+  Middle trapezius    Lower trapezius    Elbow flexion 4 4  Elbow extension 4 4  Wrist flexion 4+ 4+  Wrist extension 4 4  Wrist ulnar deviation    Wrist radial deviation    Wrist pronation    Wrist supination    Grip strength     (Blank rows = not tested)   FUNCTIONAL TESTS:  5 times sit to stand: 19.32 sec   TREATMENT DATE: [facility]  Adult PT Treatment:                                                DATE: 12/19/24  Manual Therapy: STM, TPR cervical paraspinals, bilat UT, suboccipitals, scalenes, pecs Gentle ROM c spine as tolerated Neuromuscular re-ed: Supine horizontal abd yellow TB x 15 Supine bilat shoulder flexion holding yellow TB x 15 Therapeutic Activity: Doorway stretch 3 x 30 sec mid  and high Standing thoracic rotation at wall Scap squeeze x 10  Modalities: Moist heat end of session   [facility] Adult PT Treatment:                                                DATE: 12/16/24 Therapeutic Exercise: HEP update and review Manual Therapy: STM, TPR cervical paraspinals, bilat UT, suboccipitals, scalenes, pecs Gentle ROM c spine as tolerated Neuromuscular re-ed: Head on coregeous ball:nods, turns Therapeutic Activity: Doorway stretch 2 x 30 sec mid and high Standing thoracic rotation at wall Scap squeeze x 10  Modalities: Moist heat end of session   [facility] Adult PT Treatment:                                                DATE: 12/12/24  Manual Therapy: Cupping UT, Cervical paraspinals STM, TPR cervical paraspinals, bilat UT, suboccipitals Gentle ROM c spine as tolerated Neuromuscular re-ed: Supine horizontal abd yellow TB x 10 Supine bilat shoulder flexion holding yellow TB x 10 Head on coregeous ball:nods, turns Therapeutic Activity: Cupping with head nods, head turns Seated thoracic rotation with physioball Scap squeeze x 10 Seated cat/camel  Modalities: Moist heat end of session   [facility] Adult PT Treatment:                                                DATE: 12/09/24  Manual Therapy: STM, TPR cervical paraspinals, bilat UT, suboccipitals Gentle ROM c spine as tolerated Neuromuscular re-ed: Supine nod, rotation Therapeutic Activity: Seated thoracic rotation with physioball Scap squeeze x 10 Seated cat/camel  Modalities: Moist heat end of session Self Care: Pt educated on dry needling as MD recommended this as a treatment option    PATIENT EDUCATION:  Education details: HEP and POC Person educated: Patient Education method: Programmer, Multimedia, Demonstration, Actor cues, Verbal cues, and Handouts Education comprehension: verbalized understanding, returned demonstration, verbal cues required, tactile cues required, and needs further education  HOME  EXERCISE PROGRAM: Access Code: [facility] URL: [email or web address] Date: 12/16/2024 Prepared by: Darice Conine  Exercises - Seated Cervical Sidebending AROM  - 1 x daily - 7 x weekly - 3 sets - 10 reps - Seated Lumbar and Thoracic Forward Rotation with Anchored Resistance  - 1 x daily - 7 x weekly - 2 sets - 10 reps - Seated Cat Cow  - 1 x daily - 7 x weekly - 2 sets - 10 reps - Standing with Forearms Thoracic Rotation  - 1 x daily - 7 x weekly -  1 sets - 10 reps - 5 seconds hold - Doorway Pec Stretch at 90 Degrees Abduction  - 1 x daily - 7 x weekly - 1 sets - 3 reps - 30 seconds hold  ASSESSMENT:  CLINICAL IMPRESSION: Continued to progress strength and cervical and thoracic mobility. Pt with some discomfort at end range stretching but improves tolerance with modified range.  OBJECTIVE IMPAIRMENTS: Abnormal gait, decreased activity tolerance, decreased balance, decreased coordination, decreased endurance, decreased mobility, decreased ROM, decreased strength, hypomobility, increased fascial restrictions, increased muscle spasms, impaired flexibility, impaired UE functional use, improper body mechanics, postural dysfunction, and pain.    GOALS: Goals reviewed with patient? Yes  SHORT TERM GOALS: Target date: 11/29/24  Pt will be efficient with initial HEP so she has increased independence for everyday activities.  Baseline:  Goal status: MET  2.  Pt will report no more than 6/10 neck pain so she has increased functional capacity for driving safely Baseline: 1/89 Goal status: MET 12/02/24   3.  Pt will improve 5 sit to stands test by completing in 14 sec or less so she has increased strength and mobility for community services Baseline:  Goal status: INITIAL    LONG TERM GOALS: Target date: 12/24/24  Pt will be efficient with advanced HEP so she has increased independence for everyday activities.  Baseline:  Goal status: INITIAL  2.  Pt will be able to increase  cervical flexion and extension ROM at least 10 deg or more so she has increased functional capacity for everyday ADL's (ex: reading,etc) Baseline: flexion 20 deg, ext 15 deg  Goal status: INITIAL  3.  Pt will improve NDI survey by scoring at least 10 % improvement so she can lift objects for ADL's safely Baseline: 62% (31/50) Goal status: INITIAL     PLAN:  PT FREQUENCY: 2x/week  PT DURATION: 8 weeks  PLANNED INTERVENTIONS: [id number]- PT Re-evaluation, 97750- Physical Performance Testing, 97110-Therapeutic exercises, 97530- Therapeutic activity, [id number]- Neuromuscular re-education, 97535- Self Care, [id number]- Manual therapy, [id number]- Gait training, [phone]- Aquatic Therapy, [phone] (1-2 muscles), [id number] (3+ muscles)- Dry Needling, Patient/Family education, Balance training, Stair training, Joint mobilization, Spinal mobilization, Cryotherapy, and Moist heat  PLAN FOR NEXT SESSION: Thoracic and cervical mobility, postural strength.    Ariana Juul, PT 12/19/2024, 11:42 AM      "

## 2024-12-23 ENCOUNTER — Ambulatory Visit: Admitting: Physical Therapy

## 2024-12-25 ENCOUNTER — Other Ambulatory Visit: Payer: Self-pay | Admitting: Medical-Surgical

## 2024-12-26 ENCOUNTER — Encounter: Payer: Self-pay | Admitting: Physical Therapy

## 2024-12-26 ENCOUNTER — Ambulatory Visit: Admitting: Physical Therapy

## 2024-12-26 DIAGNOSIS — M542 Cervicalgia: Secondary | ICD-10-CM

## 2024-12-26 NOTE — Addendum Note (Signed)
 Addended by: REENA DARICE POUR on: 12/26/2024 10:56 AM   Modules accepted: Orders

## 2024-12-29 ENCOUNTER — Other Ambulatory Visit: Payer: Self-pay | Admitting: Medical-Surgical

## 2024-12-29 ENCOUNTER — Other Ambulatory Visit: Payer: Self-pay | Admitting: Adult Health

## 2024-12-29 DIAGNOSIS — F411 Generalized anxiety disorder: Secondary | ICD-10-CM

## 2024-12-29 DIAGNOSIS — F322 Major depressive disorder, single episode, severe without psychotic features: Secondary | ICD-10-CM

## 2024-12-29 NOTE — Telephone Encounter (Signed)
 Pt requesting refill for Fluoxetine  is also taking Pristiq . Please sign if ok

## 2024-12-30 ENCOUNTER — Ambulatory Visit: Admitting: Physical Therapy

## 2024-12-31 NOTE — Telephone Encounter (Signed)
Pt is scheduled for tomorrow at 930

## 2025-01-01 ENCOUNTER — Telehealth: Admitting: Adult Health

## 2025-01-01 ENCOUNTER — Ambulatory Visit: Admitting: Professional

## 2025-01-01 ENCOUNTER — Ambulatory Visit: Admitting: Rehabilitative and Restorative Service Providers"

## 2025-01-01 ENCOUNTER — Encounter: Payer: Self-pay | Admitting: Rehabilitative and Restorative Service Providers"

## 2025-01-01 ENCOUNTER — Encounter: Payer: Self-pay | Admitting: Adult Health

## 2025-01-01 DIAGNOSIS — F411 Generalized anxiety disorder: Secondary | ICD-10-CM | POA: Diagnosis not present

## 2025-01-01 DIAGNOSIS — M25551 Pain in right hip: Secondary | ICD-10-CM

## 2025-01-01 DIAGNOSIS — F322 Major depressive disorder, single episode, severe without psychotic features: Secondary | ICD-10-CM | POA: Diagnosis not present

## 2025-01-01 DIAGNOSIS — G4700 Insomnia, unspecified: Secondary | ICD-10-CM | POA: Diagnosis not present

## 2025-01-01 DIAGNOSIS — M542 Cervicalgia: Secondary | ICD-10-CM

## 2025-01-01 DIAGNOSIS — M6281 Muscle weakness (generalized): Secondary | ICD-10-CM

## 2025-01-01 MED ORDER — FLUOXETINE HCL 10 MG PO CAPS: 10.0000 mg | ORAL_CAPSULE | Freq: Every day | ORAL | 0 refills | Status: AC

## 2025-01-01 MED ORDER — DESVENLAFAXINE SUCCINATE ER 100 MG PO TB24: 100.0000 mg | ORAL_TABLET | Freq: Every day | ORAL | 0 refills | Status: AC

## 2025-01-01 NOTE — Progress Notes (Signed)
 Heather Thompson [id number] 1951-02-15 74 y.o.  Virtual Visit via Video Note  I connected with pt @ on 01/01/25 at  9:30 AM EST by a video enabled telemedicine application and verified that I am speaking with the correct person using two identifiers.   I discussed the limitations of evaluation and management by telemedicine and the availability of in person appointments. The patient expressed understanding and agreed to proceed.  I discussed the assessment and treatment plan with the patient. The patient was provided an opportunity to ask questions and all were answered. The patient agreed with the plan and demonstrated an understanding of the instructions.   The patient was advised to call back or seek an in-person evaluation if the symptoms worsen or if the condition fails to improve as anticipated.  I provided 25 minutes of non-face-to-face time during this encounter.  The patient was located at home.  The provider was located at [facility].   Heather LOISE Sayers, NP   Subjective:   Patient ID:  Avalina Thompson is a 74 y.o. (DOB 1951-07-05) female.  Chief Complaint: No chief complaint on file.   HPI Heather Thompson presents for follow-up of MDD, GAD and insomnia.    Describes mood today as ok. Pleasant. Mood symptoms - reports some depression - kind of blah - having a hard time enjoying things. Reports lower interest and motivation. Reports anxiety off and on - not like it has been in the past. Denies irritability. Denies panic attacks. Reports worry and over thinking. Denies rumination. Reports mood is stable. Stating I feel like I'm doing ok - 7 to 8 out of 10. Reports chronic pain issues - working with pain management. She reports taking current medications as prescribed.  Energy levels lower. Active, does not have a regular exercise routine with physical limitations.  Enjoys some usual interests and activities. Lives alone. Attends church. Signed up for an online painting  class. Traveling to [place] this weekend for a [place]. Born [place] raised in [place]. Appetite adequate. Reports weight loss. Weight today - 188 from 195 pounds. Reports gastric bypass 27 years ago. Sleep has declined with discontinuation of Restoril . Averages 3 to 4 hours. Reports morning naps. Reports focus and concentration ok, not great. Completing tasks. Managing aspects of household.  Denies SI or HI.  Denies AH or VH. Denies self harm.  Denies substance use. Denies alcohol use.  Previous medication trials: Trazadone - suicidal thoughts    Review of Systems:  Review of Systems  Musculoskeletal:  Negative for gait problem.  Neurological:  Negative for tremors.  Psychiatric/Behavioral:         Please refer to HPI    Medications: I have reviewed the patient's current medications.  Current Outpatient Medications  Medication Sig Dispense Refill   buPROPion  (WELLBUTRIN  XL) 300 MG 24 hr tablet Take 1 tablet (300 mg total) by mouth daily. Take in the evening 90 tablet 0   cyclobenzaprine (FLEXERIL) 10 MG tablet Take 10 mg by mouth every 8 (eight) hours as needed for muscle spasms.     desvenlafaxine  (PRISTIQ ) 100 MG 24 hr tablet Take 1 tablet (100 mg total) by mouth daily. 90 tablet 0   FLUoxetine  (PROZAC ) 10 MG capsule Take 1 capsule (10 mg total) by mouth daily. 90 capsule 0   furosemide  (LASIX ) 20 MG tablet Take 1 tablet (20 mg total) by mouth daily. 30 tablet 0   gabapentin  (NEURONTIN ) 300 MG capsule Take 1 capsule (300 mg total) by mouth 3 (three) times  daily. 90 capsule 0   HYDROmorphone (DILAUDID) 4 MG tablet Take 4 mg by mouth 4 (four) times daily as needed.     lisinopril  (ZESTRIL ) 20 MG tablet Take 1 tablet (20 mg total) by mouth daily. 90 tablet 3   Magnesium 250 MG TABS Take 250 mg by mouth at bedtime.     nystatin  (MYCOSTATIN /NYSTOP ) powder APPLY TO THE AFFECTED AREA(S) EXTERNALLY TWICE DAILY 30 g 0   propranolol  ER (INDERAL  LA) 60 MG 24 hr capsule Take 1 capsule (60 mg total) by  mouth daily. 90 capsule 0   solifenacin  (VESICARE ) 10 MG tablet Take 1 tablet (10 mg total) by mouth daily. 90 tablet 3   tiZANidine  (ZANAFLEX ) 2 MG tablet TAKE 1 TABLET (2 MG TOTAL) BY MOUTH IN THE MORNING AND AT BEDTIME. 180 tablet 1   No current facility-administered medications for this visit.    Medication Side Effects: None  Allergies: Allergies[1]  Past Medical History:  Diagnosis Date   Abnormality of gait 04/23/2019   Acquired hypothyroidism 07/29/2021   Allergic rhinitis due to animal hair and dander 12/02/2015   Formatting of this note might be different from the original.  [facility]  Seen by Dr. Leita 11/11/15     Plan- Received allergy shots today. Follow up 1 year.     Allergy    Anemia    Anemia    Anxiety    Arthritis of scaphoid-trapezium-trapezoid joint of right hand 09/10/2023   B12 deficiency    Back pain    Chronic fatigue 07/29/2021   Chronic fatigue syndrome    Chronic throat clearing 03/14/2023   Clotting disorder    Constipation    Depression with anxiety 04/07/2009   Formatting of this note might be different from the original.  well controlled on current meds, follows with psychiatrist in [place]. Had a   h/o severe depression 5 years ago, and had ECT.     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  B12 deficiency   Iron deficiency     Herniated lumbar intervertebral disc 11/12/2020   High cholesterol    History of blood clots    History of cervical spinal surgery 01/01/2018   Formatting of this note might be different from the original.  Fusion C6 and C7 per 2018 MRI report.     History of left hip replacement  01/14/2018   History of Roux-en-Y gastric bypass    Hoarseness 03/14/2023   Hyperlipidemia 08/19/2010   Hypertension    Hyponatremia 01/17/2018   Hypothyroidism    Hypothyroidism    IBS (irritable bowel syndrome) 04/06/2021   Impaired fasting glucose 08/19/2010   Incontinence 10/30/2013   Formatting of this note might be different from the original. Formatting of this note might be different from the original. Urinary Incontinence     Infertility, female    Insomnia 04/07/2009   Formatting of this note might be different from the original.  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  Followed by [facility]  Seen by Dr. Linnette 03/01/15     S/p changing thoracic spinal cord stimulator generator 08/18/14.     Lumbar spine films show spinal cord generator in left flank area. 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  Follows with Rheumatology in [facility] of this note might be different from the original.  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  Getting iron infusion via port at hematalogy/oncology center at [facility]   Was recently hospitalized in [facility] for severe anemia     Formatting of this note might be different from the original.  on OTC b12 currently     Vitamin D  deficiency     Family History  Problem Relation Age of Onset   Hyperlipidemia Mother    Hypertension Mother    Heart attack Mother    Heart disease Mother    Sudden death Mother    Depression Mother    Anxiety disorder Mother    Obesity Mother    Anxiety disorder Father    Depression Father    Cancer Father    Hyperlipidemia Father    Hypertension Father    Lung cancer Father    Prostate cancer Father    Kidney cancer Brother    Healthy Daughter     Healthy Son     Social History   Socioeconomic History   Marital status: Widowed    Spouse name: Not on file   Number of children:  2   Years of education: 64   Highest education level: 12th grade  Occupational History    Comment: Retired.   Occupation: Retired  Tobacco Use   Smoking status: Never    Passive exposure: Past   Smokeless tobacco: Never  Vaping Use   Vaping status: Never Used  Substance and Sexual Activity   Alcohol use: Not Currently    Comment: rarely   Drug use: Never   Sexual activity: Not Currently  Other Topics Concern   Not on file  Social History Narrative   Lives alone. She has two children and 4 grandchildren. She stays active, enjoys doing crafts and painting.    Social Drivers of Health   Tobacco Use: Low Risk (01/01/2025)   Patient History    Smoking Tobacco Use: Never    Smokeless Tobacco Use: Never    Passive Exposure: Past  Financial Resource Strain: Low Risk (08/27/2023)   Received from [facility]   Overall Financial Resource Strain (CARDIA)    Difficulty of Paying Living Expenses: Not hard at all  Food Insecurity: No Food Insecurity (10/13/2024)   Received from [facility]   Epic    Within the past 12 months, you worried that your food would run out before you got the money to buy more.: Never true    Within the past 12 months, the food you bought just didn't last and you didn't have money to get more.: Never true  Transportation Needs: No Transportation Needs (10/13/2024)   Received from [facility]    In the past 12 months, has lack of transportation kept you from medical appointments or from getting medications?: No    In the past 12 months, has lack of transportation kept you from meetings, work, or from getting things needed for daily living?: No  Physical Activity: Inactive (01/09/2022)   Exercise Vital Sign    Days of Exercise per Week: 0 days    Minutes of Exercise per Session: 0 min  Stress: No Stress Concern  Present (10/13/2024)   Received from [facility] of Occupational Health - Occupational Stress Questionnaire    Do you feel stress - tense, restless, nervous, or anxious, or unable to sleep at night because your mind is troubled all the time - these days?: Not at all  Social Connections: Socially Integrated (03/24/2022)   Received from [facility]   Social Network    How would you rate your social network (family, work, friends)?: Good participation with social networks  Intimate Partner Violence: Not At Risk (10/13/2024)   Received from [facility]   HITS    Over the last 12 months how often did your partner physically hurt you?: Never    Over the last 12 months how often did your partner insult you or talk down to you?: Never    Over the last 12 months how often did your partner threaten you with physical harm?: Never    Over the last 12 months how often did your partner scream or curse at you?: Never  Depression (PHQ2-9): Medium Risk (12/02/2024)   Depression (PHQ2-9)    PHQ-2 Score: 10  Alcohol Screen: Low Risk (01/09/2022)   Alcohol Screen    Last Alcohol Screening Score (AUDIT): 1  Housing: Low Risk (10/13/2024)   Received from [facility]    In the last 12 months, was there a time when you were not able to pay the mortgage or  rent on time?: No    In the past 12 months, how many times have you moved where you were living?: 0    At any time in the past 12 months, were you homeless or living in a shelter (including now)?: No  Utilities: Not At Risk (10/13/2024)   Received from [facility]    In the past 12 months has the electric, gas, oil, or water company threatened to shut off services in your home?: No  Health Literacy: Not on file    Past Medical History, Surgical history, Social history, and Family history were reviewed and updated as appropriate.   Please see review of systems for further details on the patient's review from today.    Objective:   Physical Exam:  There were no vitals taken for this visit.  Physical Exam Constitutional:      General: She is not in acute distress. Musculoskeletal:        General: No deformity.  Neurological:     Mental Status: She is alert and oriented to person, place, and time.     Coordination: Coordination normal.  Psychiatric:        Attention and Perception: Attention and perception normal. She does not perceive auditory or visual hallucinations.        Mood and Affect: Mood normal. Mood is not anxious or depressed. Affect is not labile, blunt, angry or inappropriate.        Speech: Speech normal.        Behavior: Behavior normal.        Thought Content: Thought content normal. Thought content is not paranoid or delusional. Thought content does not include homicidal or suicidal ideation. Thought content does not include homicidal or suicidal plan.        Cognition and Memory: Cognition and memory normal.        Judgment: Judgment normal.     Comments: Insight intact     Lab Review:     Component Value Date/Time   NA 134 09/23/2024 1448   K 5.2 09/23/2024 1448   CL 94 (L) 09/23/2024 1448   CO2 24 09/23/2024 1448   GLUCOSE 111 (H) 09/23/2024 1448   GLUCOSE 92 02/20/2023 1128   BUN 22 09/23/2024 1448   CREATININE 0.80 09/23/2024 1448   CREATININE 0.80 02/20/2023 1128   CALCIUM 9.7 09/23/2024 1448   PROT 6.9 08/14/2023 1357   ALBUMIN 4.2 08/14/2023 1357   AST 30 08/14/2023 1357   AST 30 09/07/2021 1433   ALT 29 08/14/2023 1357   ALT 31 09/07/2021 1433   ALKPHOS 107 08/14/2023 1357   BILITOT 0.4 08/14/2023 1357   BILITOT 0.3 09/07/2021 1433   GFRNONAA >60 09/07/2021 1433       Component Value Date/Time   WBC 5.5 08/11/2024 1351   WBC 4.7 12/19/2022 1157   RBC 4.22 08/11/2024 1352   RBC 4.22 08/11/2024 1351   HGB 12.5 08/11/2024 1351   HCT 38.4 08/11/2024 1351   PLT 267 08/11/2024 1351   MCV 91.0 08/11/2024 1351   MCH 29.6 08/11/2024 1351   MCHC 32.6  08/11/2024 1351   RDW 13.0 08/11/2024 1351   LYMPHSABS 1.2 08/11/2024 1351   MONOABS 0.5 08/11/2024 1351   EOSABS 0.2 08/11/2024 1351   BASOSABS 0.0 08/11/2024 1351    No results found for: POCLITH, LITHIUM   No results found for: PHENYTOIN, PHENOBARB, VALPROATE, CBMZ   .res Assessment: Plan:    Treatment Plan/Recommendations:   PDMP  reviewed  Plan:  Continue: Pristiq  100mg  daily  Prozac  10mg  daily  Wellbutrin  XL 300mg  in the morning  Change: D/C Restoril  22.5mg  bedtime - dc'd by pain management  Working w PCP for elevated BP - last BP 12/22/2024 129/76/63  Working with therapist  RTC 3 months  25 minutes spent dedicated to the care of this patient on the date of this encounter to include pre-visit review of records, ordering of medication, post visit documentation, and face-to-face time with the patient discussing MDD, GAD and insomnia. Discussed continuing current medication regimen.  Patient advised to contact office with any questions, adverse effects, or acute worsening in signs and symptoms.  Diagnoses and all orders for this visit:  Severe major depression (HCC) -     desvenlafaxine  (PRISTIQ ) 100 MG 24 hr tablet; Take 1 tablet (100 mg total) by mouth daily.  Generalized anxiety disorder -     FLUoxetine  (PROZAC ) 10 MG capsule; Take 1 capsule (10 mg total) by mouth daily.  Insomnia, unspecified type     Please see After Visit Summary for patient specific instructions.  Future Appointments  Date Time Provider Department Center  01/01/2025 11:00 AM Gerome Service, [facility] None  01/01/2025  2:00 PM Ina Florene SQUIBB, [place] [facility]  01/06/2025  9:30 AM Reena Darice POUR, PT [facility]  01/13/2025  1:15 PM Reena, Darice POUR, PT [facility]  01/13/2025  2:40 PM Willo Mini, NP [facility] [place]  01/15/2025 11:00 AM Gerome Service, [facility] None  01/16/2025 11:00 AM Reena, Darice POUR, PT [facility]  01/26/2025  1:30 PM  [facility] LAB [facility] None  01/26/2025  1:45 PM [facility] INJ NURSE [facility] None  01/26/2025  2:00 PM Franchot Lauraine HERO, NP [facility] None  01/29/2025 11:00 AM Gerome Service, [facility] None  02/12/2025 11:00 AM Gerome Service, [facility] None  05/20/2025  1:15 PM Stoneking, Adine PARAS., MD [facility]UR-[facility] None  06/10/2025 11:30 AM Orman Erminio POUR, PA-C [facility]DERM None    No orders of the defined types were placed in this encounter.     -------------------------------     [1]  Allergies Allergen Reactions   Compazine [Prochlorperazine] Anaphylaxis   Phenothiazines Anaphylaxis and Swelling    Caused her throat to close     Prednisone Itching, Swelling and Rash    Facial swelling   Pregabalin Itching and Swelling    Swelling and itching of hands and feet.      Cefadroxil Itching

## 2025-01-01 NOTE — Therapy (Signed)
 " OUTPATIENT PHYSICAL THERAPY CERVICAL TREATMENT    Patient Name: Heather Thompson MRN: [id number] DOB:[birth year], 74 y.o., female Today's Date: 01/01/2025  Dates of service: 10/29/24-12/16/24 END OF SESSION:  PT End of Session - 01/01/25 1403     Visit Number 13    Number of Visits 17    Date for Recertification  02/06/25   Certification End Date: 02/06/25; updated 12/26/24   Authorization Type Medicare Part A and B    Authorization - Visit Number 14    Authorization - Number of Visits 17    PT Start Time 1405    PT Stop Time 1450    PT Time Calculation (min) 45 min    Activity Tolerance Patient tolerated treatment well                   Past Medical History:  Diagnosis Date   Abnormality of gait 04/23/2019   Acquired hypothyroidism 07/29/2021   Allergic rhinitis due to animal hair and dander 12/02/2015   Formatting of this note might be different from the original.  [facility]  Seen by Dr. Leita 11/11/15     Plan- Received allergy shots today. Follow up 1 year.     Allergy    Anemia    Anemia    Anxiety    Arthritis of scaphoid-trapezium-trapezoid joint of right hand 09/10/2023   B12 deficiency    Back pain    Chronic fatigue 07/29/2021   Chronic fatigue syndrome    Chronic throat clearing 03/14/2023   Clotting disorder    Constipation    Depression with anxiety 04/07/2009   Formatting of this note might be different from the original.  well controlled on current meds, follows with psychiatrist in [place]. Had a   h/o severe depression 5 years ago, and had ECT.     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  B12 deficiency   Iron deficiency      Herniated lumbar intervertebral disc 11/12/2020   High cholesterol    History of blood clots    History of cervical spinal surgery 01/01/2018   Formatting of this note might be different from the original.  Fusion C6 and C7 per 2018 MRI report.     History of left hip replacement 01/14/2018   History of Roux-en-Y gastric bypass    Hoarseness 03/14/2023   Hyperlipidemia 08/19/2010   Hypertension    Hyponatremia 01/17/2018   Hypothyroidism    Hypothyroidism    IBS (irritable bowel syndrome) 04/06/2021   Impaired fasting glucose 08/19/2010   Incontinence 10/30/2013   Formatting of this note might be different from the original. Formatting of this note might be different from the original. Urinary Incontinence     Infertility, female    Insomnia 04/07/2009   Formatting of this note might be different from the original.  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  Followed by [facility]  Seen by Dr. Linnette 03/01/15     S/p changing thoracic spinal cord stimulator generator 08/18/14.     Lumbar spine films show spinal cord generator in left flank area. 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  Follows with Rheumatology in [facility] of this note might be different from the original.  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  Getting iron infusion via port at hematalogy/oncology center at [facility].   Was recently hospitalized in [facility] for severe anemia     Formatting of this note might be different from the original.  on OTC b12 currently     Vitamin D  deficiency    Past Surgical History:  Procedure Laterality Date   ABDOMINAL HYSTERECTOMY     APPENDECTOMY     CARPAL TUNNEL RELEASE Bilateral 2023   with thumb joint  replacement   CERVICAL FUSION     CHOLECYSTECTOMY     FOOT SURGERY Bilateral    fusion   GASTRIC BYPASS     IR EMBO ARTERIAL NOT HEMORR HEMANG INC GUIDE ROADMAPPING  04/01/2024   IR RADIOLOGIST EVAL & MGMT  03/14/2024   IR RADIOLOGIST EVAL & MGMT  04/24/2024   SPINAL CORD STIMULATOR INSERTION  07/2023   TOTAL HIP ARTHROPLASTY Bilateral    11/2016 & 05/2017   Patient Active Problem List   Diagnosis Date Noted   Status post cervical spinal fusion 12/02/2024   Onychodystrophy 08/14/2024   Oral candidiasis 07/08/2024   Chronic bilateral hip pain after total replacement of both hip joints 06/18/2024  Left leg pain 06/18/2024   Mononeuropathy 12/14/2023   Palpitations 10/04/2023   Cardiac murmur 10/04/2023   Allergy    Anemia    Anxiety    B12 deficiency    Back pain    Chronic fatigue syndrome    Clotting disorder    Constipation    Edema of both lower extremities    Fatty liver    Fibromyalgia    Gallbladder problem    High cholesterol    History of blood clots    History of Roux-en-Y gastric bypass    Hypothyroidism    Joint pain    Kidney stones    Muscle disorder    Osteoarthritis    Prediabetes    Salzmann nodular degeneration    SOBOE (shortness of breath on exertion)    Swallowing difficulty    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   Arthritis of scaphoid-trapezium-trapezoid joint of right hand 09/10/2023   Urge incontinence 08/28/2023   OAB (overactive bladder) 08/28/2023   Lumbar post-laminectomy syndrome 07/06/2023   Snapping hip syndrome, 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   Other myositis, left thigh 04/24/2023   Sacroiliitis, not elsewhere classified 04/24/2023   Dysphagia 04/17/2023   Elevated liver enzymes 04/02/2023   Elevated TSH 04/02/2023   Chronic throat clearing 03/14/2023   Globus sensation 03/14/2023   Hoarseness 03/14/2023   Muscle tension dysphonia 03/12/2023   Unspecified inflammatory spondylopathy, 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   Primary osteoarthritis  of both knees 12/21/2021   Port-A-Cath in place 07/29/2021   Acquired hypothyroidism 07/29/2021   Chronic pain syndrome 07/29/2021   Vitamin B12 deficiency 07/29/2021   Vitamin D  deficiency 07/29/2021   Postmenopausal estrogen deficiency 07/29/2021   IBS (irritable bowel syndrome) 04/06/2021   Retention of urine 04/06/2021   Herniated lumbar intervertebral disc 11/12/2020   Skin sensation disturbance 11/12/2020   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   Spondylopathy, unspecified 11/12/2020   Trochanteric bursitis of both hips 11/19/2019   Low serum iron 04/23/2019   Osteomalacia 04/23/2019   Abnormality of gait 04/23/2019   History of bilateral hip arthroplasty 06/26/2018   Gastroesophageal reflux disease 06/18/2018   Hypertension 06/18/2018   Intertrigo 04/30/2018   Lentigines 04/30/2018   Hyponatremia 01/17/2018   History of cervical spinal surgery 01/01/2018   Obesity (BMI 30-39.9) 01/01/2018   SK (seborrheic keratosis) 04/18/2017   Pain management contract agreement 11/17/2016   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   Allergic rhinitis due to animal hair and dander 12/02/2015   Low back pain 03/03/2015   Incontinence 10/30/2013   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   Neutrophilic leukocytosis 08/12/2012   Thrombocytosis 08/12/2012   Osteoporosis, senile 11/15/2010   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   Hyperlipidemia 08/19/2010   Impaired fasting glucose 08/19/2010   Polyarthritis 09/28/2009   Degenerative disc disease 04/07/2009   Depression with anxiety 04/07/2009   Myopathy 04/07/2009   Insomnia 04/07/2009   Migraine 04/07/2009   Scoliosis 04/07/2009   Seasonal allergies 04/07/2009   Vitamin B12 deficiency anemia 04/07/2009    PCP: No PCP  REFERRING PROVIDER: Zada Palin, NP  REFERRING DIAG: M45 Ankylosing Spondylitis of cervical region M50.11 Cervical radiculopathy M79.7 Fibromyalgia  THERAPY DIAG:  Cervicalgia  Muscle weakness (generalized)  Bilateral hip  pain  Rationale for Evaluation and Treatment: Rehabilitation  ONSET DATE: November 10   SUBJECTIVE:  SUBJECTIVE STATEMENT: Pt states she has tightened up. She thinks that may be related to the weather at least in part. She has the best movement as soon as she gets out of the bed but she will also has more pain. As the day goes on she has more pain and more tightness. Likes the doorway stretch.   Hand dominance: Left  PERTINENT HISTORY:  See above  Spinal stimulator C1-C2 fusion   Pt reports she was supposed to have knee surgery in August, however the week before, she was diagnosed for having cellulitis in left leg, requiring hospitalization. Around the same time, pt also needed to have and was approved having C1-C2 fusion surgery on Nov 10, 25. She feels very tight on the back of the neck muscles, stating she cannot open her mouth to let food in all the way, and has difficulty chewing food. Reports staying 8 days in inpatient rehab after surgery. Staples removed 3 days ago. At this time her knee surgery is cancelled, and focus is on rehabilitating the neck.   PAIN:  Are you having pain? Yes: NPRS scale: 6-7/10 Pain location: back of neck , bil upper traps, upper anterior thoracic  Pain description: constant, throbbing, sharp, tight, electric  Aggravating factors: everything hurts Relieving factors: muscle relxer , pain meds   PRECAUTIONS: Other: Spinal stimulator    WEIGHT BEARING RESTRICTIONS: No  FALLS:  Has patient fallen in last 6 months? No  LIVING ENVIRONMENT: Lives with: lives alone Lives in: House/apartment Stairs: Yes: External: 4 steps; can reach both Has following equipment at home: None  OCCUPATION: Retired   PLOF: Independent  PATIENT GOALS: To move my neck more,  less tightness, get the rocks out of my neck and head  NEXT MD VISIT: mid Jan 2026  OBJECTIVE:  Note: Objective measures were completed at Evaluation unless otherwise noted.  DIAGNOSTIC FINDINGS:  None on file  PATIENT SURVEYS:  NDI: 62% (31/50)  COGNITION: Overall cognitive status: Within functional limits for tasks assessed  SENSATION: Not tested  POSTURE: rounded shoulders and forward head  PALPATION: Tender to palpate at bilateral upper traps Lt>Rt Tender:  -Bil lev scap Lt>Rt -suboccipitals - Bil Scalenes -Bil upper anterior thoracic region   CERVICAL ROM:   Active ROM A/PROM (deg) eval AROM 12/26/24  Flexion 20 pain 30  Extension 15 pain 30  Right lateral flexion 10 pain 20  Left lateral flexion 30 pain 25  Right rotation 15 pain 20 pain  Left rotation 11 pain 11 pain   (Blank rows = not tested)  UPPER EXTREMITY ROM:  Active ROM Right eval Left eval  Shoulder flexion 130 145  Shoulder extension [facility]  Shoulder abduction [facility]  Shoulder adduction    Shoulder extension    Shoulder internal rotation [facility] WFL  Shoulder external rotation [facility] WFL  Elbow flexion    Elbow extension    Wrist flexion    Wrist extension    Wrist ulnar deviation    Wrist radial deviation    Wrist pronation    Wrist supination     (Blank rows = not tested)  UPPER EXTREMITY MMT:  MMT Right eval Left eval  Shoulder flexion 4- 4  Shoulder extension    Shoulder abduction 4+ 4+  Shoulder adduction    Shoulder extension 4 4  Shoulder internal rotation 4+ 4+  Shoulder external rotation 4+ 4+  Middle trapezius    Lower trapezius    Elbow flexion 4 4  Elbow extension  4 4  Wrist flexion 4+ 4+  Wrist extension 4 4  Wrist ulnar deviation    Wrist radial deviation    Wrist pronation    Wrist supination    Grip strength     (Blank rows = not tested)   FUNCTIONAL TESTS:  5 times sit to stand: 19.32 sec   TREATMENT DATE:  [facility] Adult PT Treatment:                                                 DATE: 01/01/25 Therapeutic Exercise: Gentle ROM c spine as tolerated Manual Therapy: STM, TPR cervical paraspinals, bilat UT, suboccipitals, scalenes, pecs  Neuromuscular re-ed: Cervical isometrics for rotation initially with PT assist, then pt assist Nodding yes/no Therapeutic Activity: Doorway stretch 3 x 30 sec mid and high Standing thoracic rotation at wall  Modalities: Moist heat end of session Self Care: Discussed and suggested small towel roll at neck when sleeping on her side    [facility] Adult PT Treatment:                                                DATE: 12/26/24 Therapeutic Exercise: ROM measurements (see above) Manual Therapy: STM, TPR cervical paraspinals, bilat UT, suboccipitals, scalenes, pecs Gentle ROM c spine as tolerated Neuromuscular re-ed: Cervical isometrics for rotation initially with PT assist, then pt assist Therapeutic Activity: Doorway stretch 2 x 30 sec mid and high Standing thoracic rotation at wall  Modalities: Moist heat end of session Self Care: Goal check and discussion of updated goals  [facility] Adult PT Treatment:                                                DATE: 12/19/24  Manual Therapy: STM, TPR cervical paraspinals, bilat UT, suboccipitals, scalenes, pecs Gentle ROM c spine as tolerated Neuromuscular re-ed: Supine horizontal abd yellow TB x 15 Supine bilat shoulder flexion holding yellow TB x 15 Therapeutic Activity: Doorway stretch 3 x 30 sec mid and high Standing thoracic rotation at wall Scap squeeze x 10  Modalities: Moist heat end of session   [facility] Adult PT Treatment:                                                DATE: 12/16/24 Therapeutic Exercise: HEP update and review Manual Therapy: STM, TPR cervical paraspinals, bilat UT, suboccipitals, scalenes, pecs Gentle ROM c spine as tolerated Neuromuscular re-ed: Head on coregeous ball:nods, turns Therapeutic Activity: Doorway stretch 2 x 30  sec mid and high Standing thoracic rotation at wall Scap squeeze x 10  Modalities: Moist heat end of session   PATIENT EDUCATION:  Education details: HEP and POC Person educated: Patient Education method: Explanation, Demonstration, Tactile cues, Verbal cues, and Handouts Education comprehension: verbalized understanding, returned demonstration, verbal cues required, tactile cues required, and needs further education  HOME EXERCISE PROGRAM: Access Code: [facility] URL: [email or web address] Date: 12/26/2024 Prepared by: Darice Conine  Exercises -  Seated Cervical Sidebending AROM  - 1 x daily - 7 x weekly - 3 sets - 10 reps - Seated Lumbar and Thoracic Forward Rotation with Anchored Resistance  - 1 x daily - 7 x weekly - 2 sets - 10 reps - Seated Cat Cow  - 1 x daily - 7 x weekly - 2 sets - 10 reps - Standing with Forearms Thoracic Rotation  - 1 x daily - 7 x weekly - 1 sets - 10 reps - 5 seconds hold - Doorway Pec Stretch at 90 Degrees Abduction  - 1 x daily - 7 x weekly - 1 sets - 3 reps - 30 seconds hold - Seated Isometric Cervical Rotation  - 1 x daily - 7 x weekly - 1 sets - 10 reps - 3 seconds hold  ASSESSMENT:  CLINICAL IMPRESSION: Pt reports continued pain the cervical spine. Cold weather has increased tightness through the cervical spine. Discussed and offered suggestions for support for cervical spine when patient is sleeping. Suggested she lie down to rest a couple times a day to un-weight the cervical spine and rest the muscles that support head upright against gravity.   OBJECTIVE IMPAIRMENTS: Abnormal gait, decreased activity tolerance, decreased balance, decreased coordination, decreased endurance, decreased mobility, decreased ROM, decreased strength, hypomobility, increased fascial restrictions, increased muscle spasms, impaired flexibility, impaired UE functional use, improper body mechanics, postural dysfunction, and pain.    GOALS: Goals reviewed  with patient? Yes  SHORT TERM GOALS: Target date: 11/29/24  Pt will be efficient with initial HEP so she has increased independence for everyday activities.  Baseline:  Goal status: MET  2.  Pt will report no more than 6/10 neck pain so she has increased functional capacity for driving safely Baseline: 1/89 Goal status: MET 12/02/24   3.  Pt will improve 5 sit to stands test by completing in 14 sec or less so she has increased strength and mobility for community services Baseline:  Goal status: DISCONTINUED    LONG TERM GOALS: Target date: 02/06/2025    Pt will be efficient with advanced HEP so she has increased independence for everyday activities.  Baseline:  Goal status: IN PROGRESS  2.  Pt will be able to increase cervical flexion and extension ROM at least 10 deg or more so she has increased functional capacity for everyday ADL's (ex: reading,etc) Baseline: flexion 20 deg, ext 15 deg  Goal status: MET  3.  Pt will improve NDI survey by scoring at least 10 % improvement so she can lift objects for ADL's safely Baseline: 62% (31/50) Goal status: INITIAL  4.  Pt will improve cervical rotation by 5 degrees bilat to improve driving ability Baseline: see above Goal status: INITIAL      PLAN:  PT FREQUENCY: 2x/week  PT DURATION: 6 weeks  PLANNED INTERVENTIONS: [id number]- PT Re-evaluation, 97750- Physical Performance Testing, 97110-Therapeutic exercises, 97530- Therapeutic activity, [id number]- Neuromuscular re-education, 97535- Self Care, [id number]- Manual therapy, [id number]- Gait training, [id number]- Aquatic Therapy, [phone] (1-2 muscles), [id number] (3+ muscles)- Dry Needling, Patient/Family education, Balance training, Stair training, Joint mobilization, Spinal mobilization, Cryotherapy, and Moist heat  PLAN FOR NEXT SESSION: Thoracic and cervical mobility, postural strength.    Shanyn Preisler P Kellan Boehlke, PT 01/01/2025, 2:54 PM   "

## 2025-01-02 ENCOUNTER — Ambulatory Visit: Admitting: Physical Therapy

## 2025-01-06 ENCOUNTER — Ambulatory Visit: Admitting: Physical Therapy

## 2025-01-13 ENCOUNTER — Ambulatory Visit: Admitting: Physical Therapy

## 2025-01-13 ENCOUNTER — Ambulatory Visit: Admitting: Medical-Surgical

## 2025-01-15 ENCOUNTER — Ambulatory Visit: Admitting: Professional

## 2025-01-16 ENCOUNTER — Ambulatory Visit: Admitting: Physical Therapy

## 2025-01-26 ENCOUNTER — Inpatient Hospital Stay

## 2025-01-26 ENCOUNTER — Inpatient Hospital Stay: Admitting: Family

## 2025-01-29 ENCOUNTER — Ambulatory Visit: Admitting: Professional

## 2025-02-12 ENCOUNTER — Ambulatory Visit: Admitting: Professional

## 2025-02-26 ENCOUNTER — Ambulatory Visit: Admitting: Professional

## 2025-03-12 ENCOUNTER — Ambulatory Visit: Admitting: Adult Health

## 2025-05-20 ENCOUNTER — Ambulatory Visit: Admitting: Urology

## 2025-06-10 ENCOUNTER — Ambulatory Visit: Admitting: Physician Assistant
